# Patient Record
Sex: Female | Born: 1935 | Race: White | Hispanic: No | Smoking: Never smoker
Health system: Southern US, Community
[De-identification: ages and names within clinical notes are randomized; demographics above are authoritative.]

## PROBLEM LIST (undated history)

## (undated) DIAGNOSIS — N812 Incomplete uterovaginal prolapse: Secondary | ICD-10-CM

## (undated) DIAGNOSIS — E785 Hyperlipidemia, unspecified: Secondary | ICD-10-CM

## (undated) DIAGNOSIS — R002 Palpitations: Secondary | ICD-10-CM

## (undated) DIAGNOSIS — I10 Essential (primary) hypertension: Secondary | ICD-10-CM

## (undated) DIAGNOSIS — N952 Postmenopausal atrophic vaginitis: Secondary | ICD-10-CM

## (undated) DIAGNOSIS — K589 Irritable bowel syndrome without diarrhea: Secondary | ICD-10-CM

## (undated) HISTORY — DX: Hyperlipidemia, unspecified: E78.5

## (undated) HISTORY — DX: Irritable bowel syndrome, unspecified: K58.9

## (undated) HISTORY — DX: Palpitations: R00.2

## (undated) HISTORY — DX: Essential (primary) hypertension: I10

## (undated) HISTORY — DX: Postmenopausal atrophic vaginitis: N95.2

## (undated) HISTORY — DX: Incomplete uterovaginal prolapse: N81.2

---

## 2004-10-12 ENCOUNTER — Ambulatory Visit: Payer: Self-pay | Admitting: Internal Medicine

## 2004-10-25 ENCOUNTER — Ambulatory Visit: Payer: Self-pay | Admitting: Internal Medicine

## 2004-12-24 HISTORY — PX: CATARACT EXTRACTION: SUR2

## 2005-01-24 ENCOUNTER — Ambulatory Visit: Payer: Self-pay | Admitting: Ophthalmology

## 2005-01-30 ENCOUNTER — Ambulatory Visit: Payer: Self-pay | Admitting: Ophthalmology

## 2008-07-29 ENCOUNTER — Ambulatory Visit: Payer: Self-pay | Admitting: Internal Medicine

## 2010-04-11 LAB — HM MAMMOGRAPHY

## 2011-05-02 ENCOUNTER — Other Ambulatory Visit: Payer: Self-pay | Admitting: Internal Medicine

## 2012-10-23 LAB — HM MAMMOGRAPHY

## 2012-11-21 ENCOUNTER — Encounter: Payer: Self-pay | Admitting: *Deleted

## 2012-12-02 ENCOUNTER — Ambulatory Visit: Payer: Self-pay | Admitting: Internal Medicine

## 2012-12-08 ENCOUNTER — Encounter: Payer: Self-pay | Admitting: *Deleted

## 2012-12-08 DIAGNOSIS — E7800 Pure hypercholesterolemia, unspecified: Secondary | ICD-10-CM

## 2012-12-08 DIAGNOSIS — I10 Essential (primary) hypertension: Secondary | ICD-10-CM

## 2012-12-09 ENCOUNTER — Encounter: Payer: Self-pay | Admitting: Internal Medicine

## 2012-12-09 ENCOUNTER — Encounter: Payer: Self-pay | Admitting: *Deleted

## 2012-12-09 ENCOUNTER — Ambulatory Visit (INDEPENDENT_AMBULATORY_CARE_PROVIDER_SITE_OTHER): Payer: 59 | Admitting: Internal Medicine

## 2012-12-09 VITALS — BP 108/70 | HR 73 | Temp 98.4°F | Ht 63.0 in | Wt 133.0 lb

## 2012-12-09 DIAGNOSIS — R1013 Epigastric pain: Secondary | ICD-10-CM

## 2012-12-09 DIAGNOSIS — E7800 Pure hypercholesterolemia, unspecified: Secondary | ICD-10-CM

## 2012-12-09 DIAGNOSIS — I10 Essential (primary) hypertension: Secondary | ICD-10-CM | POA: Insufficient documentation

## 2012-12-09 DIAGNOSIS — R5383 Other fatigue: Secondary | ICD-10-CM

## 2012-12-09 DIAGNOSIS — R5381 Other malaise: Secondary | ICD-10-CM

## 2012-12-09 MED ORDER — PANTOPRAZOLE SODIUM 40 MG PO TBEC: 40.0000 mg | DELAYED_RELEASE_TABLET | Freq: Every day | ORAL | Status: DC

## 2012-12-11 ENCOUNTER — Ambulatory Visit: Payer: Self-pay | Admitting: Internal Medicine

## 2012-12-14 ENCOUNTER — Encounter: Payer: Self-pay | Admitting: Internal Medicine

## 2012-12-14 NOTE — Assessment & Plan Note (Signed)
Blood pressure under good control.  Same meds.  Follow.  Check metabolic panel.     

## 2012-12-14 NOTE — Progress Notes (Signed)
  Subjective:    Patient ID: Elizabeth [name], female    DOB: 1927-08-13, 78 y.o.   MRN: [id number]  HPI 78 year old female with past history of hypertension and hypercholesterolemia who comes in today for a scheduled follow up.  She states she has been doing relatively well.  Was taking Lipitor.  Was changed to Pravastatin (insurance request).  She noticed itching after she started the Pravastatin.  She is now also having stomach issues.  Occurs every evening.  Burns when she starts to eat.  Eating makes it better.  Bowels basically are the same.  Less diarrhea.  No chest pain or tightness.  Stays active.  Breathing stable.   Past Medical History  Diagnosis Date  . Irritable bowel syndrome   . Hyperlipidemia   . Hypertension     Current Outpatient Prescriptions on File Prior to Visit  Medication Sig Dispense Refill  . cetirizine (ZYRTEC) 10 MG tablet Take 10 mg by mouth daily.      . Cholecalciferol (VITAMIN D-3) 1000 UNITS CAPS Take by mouth.      . triamcinolone (NASACORT) 55 MCG/ACT nasal inhaler Place 2 sprays into the nose daily.      Marland Kitchen aspirin 81 MG tablet Take 81 mg by mouth daily.      Marland Kitchen atorvastatin (LIPITOR) 40 MG tablet Take 40 mg by mouth daily. Alternating between 20 mg  1/2 pill QOD      . pantoprazole (PROTONIX) 40 MG tablet Take 1 tablet (40 mg total) by mouth daily.  30 tablet  3  . quinapril (ACCUPRIL) 40 MG tablet Take 40 mg by mouth daily.        Review of Systems Patient denies any headache, lightheadedness or dizziness.  No significant sinus or allergy symptoms.  No chest pain, tightness or palpitations.  No increased shortness of breath, cough or congestion.  No nausea or vomiting.  Does report the increased burning in her stomach - when she starts to eat.  Eating does make it better.   No bowel change, such as increased diarrhea, constipation, BRBPR or melana.  Actually reports diarrhea may be less.  No urine change.        Objective:   Physical Exam Filed Vitals:   12/09/12 1332  BP: 108/70  Pulse: 73  Temp: 98.4 F (33.69 C)   78 year old female in no acute distress.   HEENT:  Nares - clear.  OP- without lesions or erythema.  NECK:  Supple, nontender.  No audible bruit.   HEART:  Appears to be regular. LUNGS:  Without crackles or wheezing audible.  Respirations even and unlabored.   RADIAL PULSE:  Equal bilaterally.  ABDOMEN:  Soft, nontender.  No audible abdominal bruit.   EXTREMITIES:  No increased edema to be present.                     Assessment & Plan:  EPIGASTRIC BURNING/ABDOMINAL DISCOMFORT.  Occurring every evening.  Start Protonix.  Check abdominal ultrasound.  Also check cbc, liver panel, amylase and lipase.  Follow closely.   CARDIOVASCULAR.  Stable.  Asymptomatic.  Continue risk factor modification.   HEALTH MAINTENANCE.  Physical 06/30/12.  Obtain mammogram results for this year.  Has declined further GI evaluation.

## 2012-12-14 NOTE — Assessment & Plan Note (Signed)
On no cholesterol medication now.  Low cholesterol diet and exercise.  Check lipid panel.  May consider Crestor.

## 2012-12-16 ENCOUNTER — Other Ambulatory Visit: Payer: Self-pay | Admitting: Internal Medicine

## 2012-12-16 MED ORDER — QUINAPRIL HCL 40 MG PO TABS: 40.0000 mg | ORAL_TABLET | Freq: Every day | ORAL | Status: DC

## 2012-12-16 NOTE — Telephone Encounter (Signed)
Sent in to pharmacy.  

## 2012-12-16 NOTE — Telephone Encounter (Signed)
Accupril 40 mg tab  Take one tablet by mouth every day  # 30

## 2012-12-18 ENCOUNTER — Other Ambulatory Visit (INDEPENDENT_AMBULATORY_CARE_PROVIDER_SITE_OTHER): Payer: 59

## 2012-12-18 DIAGNOSIS — E7800 Pure hypercholesterolemia, unspecified: Secondary | ICD-10-CM

## 2012-12-18 DIAGNOSIS — R5383 Other fatigue: Secondary | ICD-10-CM

## 2012-12-18 DIAGNOSIS — R1013 Epigastric pain: Secondary | ICD-10-CM

## 2012-12-18 DIAGNOSIS — I10 Essential (primary) hypertension: Secondary | ICD-10-CM

## 2012-12-18 LAB — CBC WITH DIFFERENTIAL/PLATELET
Basophils Absolute: 0.1 10*3/uL (ref 0.0–0.1)
Eosinophils Absolute: 0.3 10*3/uL (ref 0.0–0.7)
HCT: 40.3 % (ref 36.0–46.0)
Hemoglobin: 13.2 g/dL (ref 12.0–15.0)
Lymphs Abs: 1.9 10*3/uL (ref 0.7–4.0)
MCHC: 32.8 g/dL (ref 30.0–36.0)
Neutro Abs: 3.1 10*3/uL (ref 1.4–7.7)
RDW: 13.9 % (ref 11.5–14.6)

## 2012-12-18 LAB — BASIC METABOLIC PANEL
CO2: 23 mEq/L (ref 19–32)
GFR: 53.67 mL/min — ABNORMAL LOW (ref >60.00)
Glucose, Bld: 95 mg/dL (ref 70–99)
Potassium: 4.5 mEq/L (ref 3.5–5.1)
Sodium: 136 mEq/L (ref 135–145)

## 2012-12-18 LAB — LIPID PANEL: HDL: 53.4 mg/dL (ref >39.00)

## 2012-12-18 LAB — HEPATIC FUNCTION PANEL
AST: 25 U/L (ref 0–37)
Albumin: 3.8 g/dL (ref 3.5–5.2)
Alkaline Phosphatase: 52 U/L (ref 39–117)
Bilirubin, Direct: 0.1 mg/dL (ref 0.0–0.3)

## 2012-12-18 LAB — LDL CHOLESTEROL, DIRECT: Direct LDL: 226.6 mg/dL

## 2012-12-18 LAB — LIPASE: Lipase: 16 U/L (ref 11.0–59.0)

## 2012-12-18 LAB — AMYLASE: Amylase: 69 U/L (ref 27–131)

## 2012-12-19 ENCOUNTER — Other Ambulatory Visit: Payer: Self-pay | Admitting: Internal Medicine

## 2012-12-19 ENCOUNTER — Telehealth: Payer: Self-pay | Admitting: Internal Medicine

## 2012-12-19 DIAGNOSIS — E7800 Pure hypercholesterolemia, unspecified: Secondary | ICD-10-CM

## 2012-12-19 NOTE — Telephone Encounter (Signed)
Patient doing better, stomach does not hurt when she eats. She thinks the protonix is helping. She is having a little bit of pain in a different area (mostly cramping) in the last few days, however she thinks it is from her eating habits from holiday. She stated that she will back and let us know if she is having any problems.

## 2012-12-19 NOTE — Telephone Encounter (Signed)
Notify pt I reviewed her abdominal ultrasound.  The liver, pancreas, spleen - normal.  There is what appears to be a tiny stone versus polyp in the gallbladder.  No acute inflammation of the gallbladder.  No acute abnormality.  Need to know how she is doing since starting on Protonix.  Any abdominal discomfort, nausea, vomiting, etc.  Let me know if persistent pain or problems.

## 2012-12-19 NOTE — Progress Notes (Signed)
Orders placed for a follow up liver panel check in 6 weeks after starting cholesterol medication.

## 2012-12-23 ENCOUNTER — Encounter: Payer: Self-pay | Admitting: Internal Medicine

## 2012-12-24 MED ORDER — ROSUVASTATIN CALCIUM 10 MG PO TABS: 10.0000 mg | ORAL_TABLET | Freq: Every day | ORAL | Status: DC

## 2012-12-24 NOTE — Telephone Encounter (Signed)
Sent in rx for crestor #30 with 2 refills.

## 2013-01-01 ENCOUNTER — Telehealth: Payer: Self-pay | Admitting: Internal Medicine

## 2013-01-01 NOTE — Telephone Encounter (Signed)
atorvastatin (LIPITOR) 40 MG tablet  # 30

## 2013-01-02 ENCOUNTER — Other Ambulatory Visit: Payer: Self-pay | Admitting: Internal Medicine

## 2013-01-02 ENCOUNTER — Telehealth: Payer: Self-pay | Admitting: *Deleted

## 2013-01-02 MED ORDER — ATORVASTATIN CALCIUM 40 MG PO TABS: 40.0000 mg | ORAL_TABLET | Freq: Every day | ORAL | Status: DC

## 2013-01-02 NOTE — Telephone Encounter (Signed)
Dr. Lorin Picket,  The patient is requesting Lipitor 40 mg but I didn't see it on her med list. She saw you in 12/13 Please advise

## 2013-01-02 NOTE — Progress Notes (Signed)
Name brand lipitor 40mg  q day #30 with 5 refills - to [facility]

## 2013-01-02 NOTE — Telephone Encounter (Signed)
[facility] pharmacy called to see if you would switch patient's med. Patient wants to switch back to Lipitor, her insurance gave her a discount card to use on Lipitor were she can get it for $30. Patient told pharmacist that she has never taken the Crestor that you prescribed for her. Pharmacist said that you can call her if you don't understand.

## 2013-01-02 NOTE — Telephone Encounter (Signed)
Already addressed

## 2013-01-02 NOTE — Telephone Encounter (Signed)
Spoke to pharmacist.  Called in name brand lipitor 40mg  q day.  See messaging.

## 2013-01-09 ENCOUNTER — Encounter: Payer: Self-pay | Admitting: Adult Health

## 2013-01-09 ENCOUNTER — Ambulatory Visit (INDEPENDENT_AMBULATORY_CARE_PROVIDER_SITE_OTHER): Payer: 59 | Admitting: Adult Health

## 2013-01-09 ENCOUNTER — Telehealth: Payer: Self-pay | Admitting: Internal Medicine

## 2013-01-09 VITALS — BP 124/80 | HR 83 | Temp 98.1°F | Resp 16 | Ht 61.0 in | Wt 131.0 lb

## 2013-01-09 DIAGNOSIS — R399 Unspecified symptoms and signs involving the genitourinary system: Secondary | ICD-10-CM

## 2013-01-09 DIAGNOSIS — R1032 Left lower quadrant pain: Secondary | ICD-10-CM

## 2013-01-09 DIAGNOSIS — R3989 Other symptoms and signs involving the genitourinary system: Secondary | ICD-10-CM

## 2013-01-09 LAB — POCT URINALYSIS DIPSTICK
Blood, UA: NEGATIVE
Glucose, UA: NEGATIVE
Nitrite, UA: NEGATIVE
Protein, UA: NEGATIVE
Spec Grav, UA: 1.01
Urobilinogen, UA: 0.2

## 2013-01-09 MED ORDER — PREDNISONE 10 MG PO TABS: ORAL_TABLET | ORAL | Status: DC

## 2013-01-09 NOTE — Patient Instructions (Addendum)
  Please give [place] a urine sample before you leave clinic today.  Start a prednisone taper in the morning.  The probiotic we spoke about is called Librarian, academic. You can buy this over the counter at [facility].  Stop the protonix.  I have provided you with samples for Nexium. We will see if this agrees with you better.  If your symptoms do not improve after the prednisone taper we will discuss referral to a GI specialist.

## 2013-01-09 NOTE — Telephone Encounter (Signed)
Patient Information:  Caller Name: Mazzie  Phone: [phone]  Patient: Elizabeth [name]  Gender: Female  DOB: 04/01/1927  Age: 79 Years  PCP: [facility] [place]  Office Follow Up:  Does the office need to follow up with this patient?: No  Instructions For The Office: N/A   Symptoms  Reason For Call & Symptoms: Reports gas, abdominal pain with mucus/bloody stools. Reports seeing small amount of blood in the stool, but does see mucus in stools this morning. Reports harder cramping on the right side. Reports these symptoms did not start until she began taking Protonix; patient wants to stop taking Protonix.  Reviewed Health History In EMR: Yes  Reviewed Medications In EMR: Yes  Reviewed Allergies In EMR: Yes  Reviewed Surgeries / Procedures: Yes  Date of Onset of Symptoms: 01/07/2013  Guideline(s) Used:  Abdominal Pain - Female  Disposition Per Guideline:   Go to ED Now  Reason For Disposition Reached:   Bloody, black, or tarry bowel movements  Advice Given:  Call Back If:  Abdominal pain is constant and present for more than 2 hours  You become worse.  RN Overrode Recommendation:  Make Appointment  Office visit vs sending patient to ED.  Appointment Scheduled:  01/09/2013 15:45:00 Appointment Scheduled Provider:  Orville Govern

## 2013-01-09 NOTE — Progress Notes (Signed)
  Subjective:    Patient ID: Elizabeth [name], female    DOB: 08/04/1927, 79 y.o.   MRN: [id number]  HPI  Patient is a very active, pleasant 79 y/o with a hx of HTN, HLD and IBS who presents to clinic with c/o abdominal cramping, flatulence, loose, mucous stools she believes to be the results of taking protonix. She reports that she was having some issues of intolerance to some of the statins and was then started on protonix. Her symptoms above began after this. She denies fever, chills or any other problem. She reports she has not had any issues with her IBS in several years.   Current Outpatient Prescriptions on File Prior to Visit  Medication Sig Dispense Refill  . aspirin 81 MG tablet Take 81 mg by mouth daily.      Marland Kitchen atorvastatin (LIPITOR) 40 MG tablet Take 1 tablet (40 mg total) by mouth daily. Name brand only.  30 tablet  5  . cetirizine (ZYRTEC) 10 MG tablet Take 10 mg by mouth daily.      . Cholecalciferol (VITAMIN D-3) 1000 UNITS CAPS Take by mouth.      . quinapril (ACCUPRIL) 40 MG tablet Take 1 tablet (40 mg total) by mouth daily.  30 tablet  1  . triamcinolone (NASACORT) 55 MCG/ACT nasal inhaler Place 2 sprays into the nose daily.        Review of Systems  Constitutional: Negative for fever, chills, appetite change and fatigue.  Respiratory: Negative.   Cardiovascular: Negative.   Gastrointestinal: Positive for abdominal pain, blood in stool and abdominal distention. Negative for diarrhea and constipation.       Loose, mucous stools  Genitourinary: Negative.   Neurological: Negative.   Psychiatric/Behavioral: Negative.     BP 124/80  Pulse 83  Temp 98.1 F (36.7 C) (Oral)  Resp 16  Ht 5\' 1"  (1.549 m)  Wt 131 lb (59.421 kg)  BMI 24.75 kg/m2  SpO2 97%     Objective:   Physical Exam  Constitutional: She is oriented to person, place, and time. She appears well-developed and well-nourished. No distress.       Well dressed, cooperative  Cardiovascular: Normal rate,  regular rhythm and normal heart sounds.  Exam reveals no gallop.   No murmur heard. Pulmonary/Chest: Effort normal and breath sounds normal.  Abdominal: Soft. She exhibits distension. She exhibits no mass. There is tenderness. There is no rebound and no guarding.       Tenderness LLQ  Musculoskeletal: Normal range of motion.  Neurological: She is alert and oriented to person, place, and time. Coordination normal.  Skin: Skin is warm and dry.  Psychiatric: She has a normal mood and affect. Her behavior is normal. Judgment and thought content normal.        Assessment & Plan:

## 2013-01-10 LAB — URINALYSIS, ROUTINE W REFLEX MICROSCOPIC
Bilirubin Urine: NEGATIVE
Glucose, UA: NEGATIVE mg/dL
Hgb urine dipstick: NEGATIVE
Ketones, ur: NEGATIVE mg/dL
Specific Gravity, Urine: 1.015 (ref 1.005–1.030)
pH: 5.5 (ref 5.0–8.0)

## 2013-01-10 LAB — URINALYSIS, MICROSCOPIC ONLY
Casts: NONE SEEN
Crystals: NONE SEEN

## 2013-01-11 ENCOUNTER — Encounter: Payer: Self-pay | Admitting: Adult Health

## 2013-01-11 MED ORDER — ALIGN PO CAPS: 1.0000 | ORAL_CAPSULE | Freq: Every day | ORAL | Status: DC

## 2013-01-11 NOTE — Assessment & Plan Note (Addendum)
Tenderness upon palpation of LLQ. No masses palpable. Will stop protonix since common SE are abdominal cramping and flatulence. Also patient with hx of IBS, Will try a prednisone taper to see if this improves her symptoms. Start Align probiotic daily. Gave patient samples of Nexium to try should her symptoms of GERD worsen. She is aware it is also a PPI and may cause same symptoms. Send urinalysis and culture. RTC if symptoms do not improve within 5 days. Will then refer to GI.

## 2013-01-12 ENCOUNTER — Other Ambulatory Visit: Payer: Self-pay | Admitting: Adult Health

## 2013-01-12 ENCOUNTER — Encounter: Payer: Self-pay | Admitting: Internal Medicine

## 2013-01-12 LAB — URINE CULTURE

## 2013-01-12 MED ORDER — AMOXICILLIN 500 MG PO CAPS: 500.0000 mg | ORAL_CAPSULE | Freq: Two times a day (BID) | ORAL | Status: DC

## 2013-01-13 ENCOUNTER — Encounter: Payer: Self-pay | Admitting: Internal Medicine

## 2013-01-13 ENCOUNTER — Telehealth: Payer: Self-pay | Admitting: Internal Medicine

## 2013-01-13 NOTE — Telephone Encounter (Signed)
quinapril (ACCUPRIL) 40 MG tablet  #30  Needing prior authorization

## 2013-01-15 ENCOUNTER — Encounter: Payer: Self-pay | Admitting: Internal Medicine

## 2013-01-23 ENCOUNTER — Telehealth: Payer: Self-pay | Admitting: *Deleted

## 2013-01-23 NOTE — Telephone Encounter (Signed)
Called [phone] for prior authorization, they said her plan does not cover the Accupril, they will only cover the Quinapril only, the one she is taking now

## 2013-02-07 ENCOUNTER — Other Ambulatory Visit: Payer: Self-pay

## 2013-02-12 ENCOUNTER — Encounter: Payer: Self-pay | Admitting: Internal Medicine

## 2013-02-12 ENCOUNTER — Ambulatory Visit (INDEPENDENT_AMBULATORY_CARE_PROVIDER_SITE_OTHER): Payer: 59 | Admitting: Internal Medicine

## 2013-02-12 VITALS — BP 126/60 | HR 76 | Temp 97.7°F | Ht 63.0 in | Wt 133.8 lb

## 2013-02-12 DIAGNOSIS — R1032 Left lower quadrant pain: Secondary | ICD-10-CM

## 2013-02-16 ENCOUNTER — Telehealth: Payer: Self-pay | Admitting: *Deleted

## 2013-02-16 NOTE — Telephone Encounter (Signed)
Called [phone] for prior authorization on Accupril 40 mg; form is being faxed over now

## 2013-02-22 ENCOUNTER — Telehealth: Payer: Self-pay | Admitting: Internal Medicine

## 2013-02-22 ENCOUNTER — Encounter: Payer: Self-pay | Admitting: Internal Medicine

## 2013-02-22 NOTE — Assessment & Plan Note (Signed)
Resolved.  Eating now.  Bowels stable.  Follow.   

## 2013-02-22 NOTE — Progress Notes (Signed)
  Subjective:    Patient ID: Elizabeth [name], female    DOB: 1927/05/09, 79 y.o.   MRN: [id number]  HPI 79 year old female with past history of hypertension and hypercholesterolemia who comes in today for a scheduled follow up.  She saw Raquel recently for left lower quadrant pain and bloody mucus - stool.  This occurred after starting protonix.  She stopped the protonix and was given some prednisone and amoxicillin.  Symptoms resolved.  She has no abdominal pain now.  Will notice occasionally, when she goes to eat some minimally cramping.  Minimal queezy feeling.  No significant nausea or vomiting.  No bowel change now.  Stable.  No abdominal pain now.  No acid reflux.  Breathing stable.   Past Medical History  Diagnosis Date  . Irritable bowel syndrome   . Hyperlipidemia   . Hypertension     Review of Systems Patient denies any headache, lightheadedness or dizziness.  No significant sinus or allergy symptoms.  No chest pain, tightness or palpitations.  No increased shortness of breath, cough or congestion.  No nausea or vomiting.  Minimal queezy feeling as outlined.  Intermittent.  Still eating.  No abdominal pain or cramping now.   No bowel change now, such as diarrhea, constipation, BRBPR or melana.  No urine change.        Objective:   Physical Exam Filed Vitals:   02/12/13 1636  BP: 126/60  Pulse: 76  Temp: 97.7 F (36.70 C)   79 year old female in no acute distress.   HEENT:  Nares- clear.  Oropharynx - without lesions. NECK:  Supple.  Nontender.  No audible bruit.  HEART:  Appears to be regular. LUNGS:  No crackles or wheezing audible.  Respirations even and unlabored.  RADIAL PULSE:  Equal bilaterally.  ABDOMEN:  Soft, nontender.  Bowel sounds present and normal.  No audible abdominal bruit.  EXTREMITIES:  No increased edema present.  DP pulses palpable and equal bilaterally.          Assessment & Plan:  GI.  Previous abdominal pain and bloody mucus.  She related it to  starting the protonix.  Resolved now.  Discussed with her regarding further colon evaluation - i.e., colonoscopy.  She declines.  Wants to continue to monitor for now.  Follow.  Abdominal ultrasound revealed a tiny stone vs polyp.  She desires no further testing or evaluation at this point.   CARDIOVASCULAR.  Stable.  Asymptomatic.  Active.   HEALTH MAINTENANCE.  Last physical 06/30/12.  Mammogram 10/23/12 - [facility] I.

## 2013-02-22 NOTE — Assessment & Plan Note (Signed)
On lipitor now.  Low cholesterol diet and exercise.  Follow lipid panel and liver function.

## 2013-02-22 NOTE — Telephone Encounter (Signed)
Please schedule her for a physical in 7/14 (last 06/30/12).  Also schedule a fasting lab in 3 months.

## 2013-02-22 NOTE — Assessment & Plan Note (Signed)
Blood pressure under good control.  Same meds.  Follow.  Check metabolic panel.     

## 2013-02-23 NOTE — Telephone Encounter (Signed)
Left message for pt to call office

## 2013-02-24 NOTE — Telephone Encounter (Signed)
Elizabeth [name] made appointment

## 2013-05-27 ENCOUNTER — Other Ambulatory Visit (INDEPENDENT_AMBULATORY_CARE_PROVIDER_SITE_OTHER): Payer: 59

## 2013-05-27 DIAGNOSIS — I10 Essential (primary) hypertension: Secondary | ICD-10-CM

## 2013-05-27 DIAGNOSIS — E7800 Pure hypercholesterolemia, unspecified: Secondary | ICD-10-CM

## 2013-05-27 LAB — HEPATIC FUNCTION PANEL
ALT: 17 U/L (ref 0–35)
Albumin: 3.9 g/dL (ref 3.5–5.2)
Total Protein: 6.6 g/dL (ref 6.0–8.3)

## 2013-05-27 LAB — BASIC METABOLIC PANEL
Calcium: 9.5 mg/dL (ref 8.4–10.5)
GFR: 59.52 mL/min — ABNORMAL LOW (ref >60.00)
Potassium: 5.3 mEq/L — ABNORMAL HIGH (ref 3.5–5.1)

## 2013-05-27 LAB — LIPID PANEL
Cholesterol: 213 mg/dL — ABNORMAL HIGH (ref 0–200)
HDL: 53 mg/dL (ref >39.00)
VLDL: 23 mg/dL (ref 0.0–40.0)

## 2013-05-28 ENCOUNTER — Other Ambulatory Visit: Payer: Self-pay | Admitting: Internal Medicine

## 2013-05-28 ENCOUNTER — Encounter: Payer: Self-pay | Admitting: Internal Medicine

## 2013-05-28 DIAGNOSIS — E875 Hyperkalemia: Secondary | ICD-10-CM

## 2013-05-28 NOTE — Progress Notes (Signed)
Order placed for f/u potassium check.  

## 2013-06-03 ENCOUNTER — Telehealth: Payer: Self-pay | Admitting: *Deleted

## 2013-06-03 ENCOUNTER — Other Ambulatory Visit (INDEPENDENT_AMBULATORY_CARE_PROVIDER_SITE_OTHER): Payer: 59

## 2013-06-03 DIAGNOSIS — E7800 Pure hypercholesterolemia, unspecified: Secondary | ICD-10-CM

## 2013-06-03 DIAGNOSIS — E875 Hyperkalemia: Secondary | ICD-10-CM

## 2013-06-03 LAB — HEPATIC FUNCTION PANEL
AST: 22 U/L (ref 0–37)
Alkaline Phosphatase: 52 U/L (ref 39–117)
Bilirubin, Direct: 0.1 mg/dL (ref 0.0–0.3)

## 2013-06-03 NOTE — Telephone Encounter (Signed)
Pt came in for labs work and said that when you re-check her potassium you always make it a STAT, do you want it STAT?

## 2013-06-03 NOTE — Telephone Encounter (Signed)
yes

## 2013-06-04 ENCOUNTER — Encounter: Payer: Self-pay | Admitting: Internal Medicine

## 2013-07-06 ENCOUNTER — Ambulatory Visit (INDEPENDENT_AMBULATORY_CARE_PROVIDER_SITE_OTHER): Payer: 59 | Admitting: Internal Medicine

## 2013-07-06 ENCOUNTER — Encounter: Payer: Self-pay | Admitting: *Deleted

## 2013-07-06 ENCOUNTER — Telehealth: Payer: Self-pay | Admitting: Internal Medicine

## 2013-07-06 ENCOUNTER — Other Ambulatory Visit: Payer: Self-pay | Admitting: *Deleted

## 2013-07-06 ENCOUNTER — Encounter: Payer: Self-pay | Admitting: Internal Medicine

## 2013-07-06 VITALS — BP 110/70 | HR 72 | Temp 98.5°F | Ht 61.0 in | Wt 131.8 lb

## 2013-07-06 DIAGNOSIS — Z9109 Other allergy status, other than to drugs and biological substances: Secondary | ICD-10-CM

## 2013-07-06 DIAGNOSIS — R5381 Other malaise: Secondary | ICD-10-CM

## 2013-07-06 DIAGNOSIS — E7800 Pure hypercholesterolemia, unspecified: Secondary | ICD-10-CM

## 2013-07-06 DIAGNOSIS — R5383 Other fatigue: Secondary | ICD-10-CM

## 2013-07-06 DIAGNOSIS — I10 Essential (primary) hypertension: Secondary | ICD-10-CM

## 2013-07-06 MED ORDER — QUINAPRIL HCL 40 MG PO TABS: 40.0000 mg | ORAL_TABLET | Freq: Every day | ORAL | Status: DC

## 2013-07-06 NOTE — Telephone Encounter (Signed)
I think the medrol dose pack will help her sleep, but if she needs something else - she can take robitussin DM.

## 2013-07-06 NOTE — Telephone Encounter (Signed)
Unable to reach pt at contact number, sent myChart message to notify of advice.

## 2013-07-06 NOTE — Telephone Encounter (Signed)
Pt wanted to know what type of cough med she could take to help her sleep [facility]

## 2013-07-07 NOTE — Telephone Encounter (Signed)
Pt.notified

## 2013-07-09 ENCOUNTER — Encounter: Payer: Self-pay | Admitting: Internal Medicine

## 2013-07-09 DIAGNOSIS — Z9109 Other allergy status, other than to drugs and biological substances: Secondary | ICD-10-CM | POA: Insufficient documentation

## 2013-07-09 NOTE — Assessment & Plan Note (Signed)
Blood pressure under good control.  Same meds.  Follow.  Check metabolic panel.     

## 2013-07-09 NOTE — Assessment & Plan Note (Signed)
Continue zyrtec, saline and nasacort.  Follow.   

## 2013-07-09 NOTE — Progress Notes (Signed)
  Subjective:    Patient ID: Elizabeth [name], female    DOB: Jul 07, 1927, 79 y.o.   MRN: [id number]  HPI 79 year old female with past history of hypertension and hypercholesterolemia who comes in today to follow up on these issues as well as for a complete physical exam. She states she is doing relatively well.  Has been having some increased issues with allergies.  Using saline, zyrtec and nasacort.  No chest congestion.  No sob.  Staying active.  No cardiac symptoms reported with increased activity or exertion.  Breathing stable.  She does report occasionally noticing some issues with possible acid reflux.  Discussed with her regarding medication to help control.  She has had "intolerance" with previous PPIs.  Discussed zantac.  She declines.  Will avoid foods that aggravate and eating late.  No significant abdominal pain.  Bowels stable.     Past Medical History  Diagnosis Date  . Irritable bowel syndrome   . Hyperlipidemia   . Hypertension     Review of Systems Patient denies any headache, lightheadedness or dizziness.  Some allergy issues as outlined.  No chest pain, tightness or palpitations.  No increased shortness of breath, cough or congestion.  No nausea or vomiting.  No abdominal pain or cramping now.   No bowel change now, such as increased diarrhea, constipation, BRBPR or melana.  Bowels stable.  No urine change.        Objective:   Physical Exam  Filed Vitals:   07/06/13 1608  BP: 110/70  Pulse: 72  Temp: 98.5 F (32.68 C)   79 year old female in no acute distress.   HEENT:  Nares- clear.  Oropharynx - without lesions. NECK:  Supple.  Nontender.  No audible bruit.  HEART:  Appears to be regular. LUNGS:  No crackles or wheezing audible.  Respirations even and unlabored.  RADIAL PULSE:  Equal bilaterally.    BREASTS:  No nipple discharge or nipple retraction present.  Could not appreciate any distinct nodules or axillary adenopathy.  ABDOMEN:  Soft, nontender.  Bowel sounds  present and normal.  No audible abdominal bruit.  GU:  Not performed.   EXTREMITIES:  No increased edema present.  DP pulses palpable and equal bilaterally.          Assessment & Plan:  GI.  Previous abdominal pain and bloody mucus.  She related it to starting the protonix.  Resolved now.  Discussed with her regarding further colon evaluation - i.e., colonoscopy.  She declined.  Abdominal ultrasound revealed a tiny stone vs polyp.  She desires no further testing or evaluation at this point.  Wants to monitor and desires no further testing or evaluation.   CARDIOVASCULAR.  Stable.  Asymptomatic.  Active.   HEALTH MAINTENANCE.  Physical today.  Mammogram 10/23/12 - [facility] I.  GI as above.

## 2013-07-09 NOTE — Assessment & Plan Note (Signed)
On lipitor now.  Low cholesterol diet and exercise.  Follow lipid panel and liver function.

## 2013-07-29 ENCOUNTER — Other Ambulatory Visit: Payer: Self-pay

## 2013-11-04 ENCOUNTER — Other Ambulatory Visit (INDEPENDENT_AMBULATORY_CARE_PROVIDER_SITE_OTHER): Payer: 59

## 2013-11-04 DIAGNOSIS — I10 Essential (primary) hypertension: Secondary | ICD-10-CM

## 2013-11-04 DIAGNOSIS — R5383 Other fatigue: Secondary | ICD-10-CM

## 2013-11-04 DIAGNOSIS — R5381 Other malaise: Secondary | ICD-10-CM

## 2013-11-04 DIAGNOSIS — E7800 Pure hypercholesterolemia, unspecified: Secondary | ICD-10-CM

## 2013-11-04 LAB — BASIC METABOLIC PANEL
BUN: 24 mg/dL — ABNORMAL HIGH (ref 6–23)
Calcium: 8.9 mg/dL (ref 8.4–10.5)
Creatinine, Ser: 1 mg/dL (ref 0.4–1.2)
GFR: 56.04 mL/min — ABNORMAL LOW (ref >60.00)

## 2013-11-04 LAB — HEPATIC FUNCTION PANEL
ALT: 18 U/L (ref 0–35)
AST: 22 U/L (ref 0–37)
Albumin: 3.9 g/dL (ref 3.5–5.2)
Alkaline Phosphatase: 58 U/L (ref 39–117)
Bilirubin, Direct: 0.1 mg/dL (ref 0.0–0.3)
Total Bilirubin: 0.7 mg/dL (ref 0.3–1.2)
Total Protein: 6.5 g/dL (ref 6.0–8.3)

## 2013-11-04 LAB — CBC WITH DIFFERENTIAL/PLATELET
Basophils Absolute: 0 10*3/uL (ref 0.0–0.1)
Basophils Relative: 0.5 % (ref 0.0–3.0)
Eosinophils Absolute: 0.1 10*3/uL (ref 0.0–0.7)
Hemoglobin: 13.7 g/dL (ref 12.0–15.0)
Lymphs Abs: 1.7 10*3/uL (ref 0.7–4.0)
MCHC: 33.6 g/dL (ref 30.0–36.0)
MCV: 89.5 fl (ref 78.0–100.0)
Monocytes Absolute: 0.4 10*3/uL (ref 0.1–1.0)
Neutro Abs: 3.3 10*3/uL (ref 1.4–7.7)
RBC: 4.56 Mil/uL (ref 3.87–5.11)
RDW: 13.9 % (ref 11.5–14.6)

## 2013-11-04 LAB — LDL CHOLESTEROL, DIRECT: Direct LDL: 178.4 mg/dL

## 2013-11-04 LAB — LIPID PANEL
Cholesterol: 237 mg/dL — ABNORMAL HIGH (ref 0–200)
HDL: 51.6 mg/dL (ref >39.00)
Total CHOL/HDL Ratio: 5
VLDL: 19.4 mg/dL (ref 0.0–40.0)

## 2013-11-04 LAB — TSH: TSH: 2.87 u[IU]/mL (ref 0.35–5.50)

## 2013-11-05 ENCOUNTER — Encounter: Payer: Self-pay | Admitting: Internal Medicine

## 2013-11-09 ENCOUNTER — Encounter: Payer: Self-pay | Admitting: Internal Medicine

## 2013-11-09 ENCOUNTER — Ambulatory Visit (INDEPENDENT_AMBULATORY_CARE_PROVIDER_SITE_OTHER): Payer: 59 | Admitting: Internal Medicine

## 2013-11-09 VITALS — BP 130/70 | HR 70 | Temp 98.1°F | Ht 61.0 in | Wt 131.8 lb

## 2013-11-09 DIAGNOSIS — E7800 Pure hypercholesterolemia, unspecified: Secondary | ICD-10-CM

## 2013-11-09 DIAGNOSIS — R1032 Left lower quadrant pain: Secondary | ICD-10-CM

## 2013-11-09 DIAGNOSIS — Z9109 Other allergy status, other than to drugs and biological substances: Secondary | ICD-10-CM

## 2013-11-09 DIAGNOSIS — I10 Essential (primary) hypertension: Secondary | ICD-10-CM

## 2013-11-09 NOTE — Progress Notes (Signed)
Subjective:    Patient ID: Elizabeth [name], female    DOB: 10/03/1927, 79 y.o.   MRN: [id number]  HPI 79 year old female with past history of hypertension and hypercholesterolemia who comes in today for a scheduled follow up.  She states she is doing relatively well.  Some allergy issues.  Using saline, zyrtec and nasacort.  No chest congestion.  No sob.  Staying active.  No cardiac symptoms reported with increased activity or exertion.  Breathing stable. On zantac.  Controls with zantac (75mg ).  Still notices some occasional pain with eating.  Better.  Declines further w/up.   Will avoid foods that aggravate and eating late.  No significant abdominal pain.  Bowels stable.  Increased stress related to her husbands medical issues.  She feels she is handling things relatively well.     Past Medical History  Diagnosis Date  . Irritable bowel syndrome   . Hyperlipidemia   . Hypertension     Current Outpatient Prescriptions on File Prior to Visit  Medication Sig Dispense Refill  . aspirin 81 MG tablet Take 81 mg by mouth daily.      Marland Kitchen atorvastatin (LIPITOR) 40 MG tablet Take 1 tablet (40 mg total) by mouth daily. Name brand only.  30 tablet  5  . bifidobacterium infantis (ALIGN) capsule Take 1 capsule by mouth daily.  30 capsule  3  . cetirizine (ZYRTEC) 10 MG tablet Take 10 mg by mouth daily as needed.       . Cholecalciferol (VITAMIN D-3) 1000 UNITS CAPS Take by mouth.      . quinapril (ACCUPRIL) 40 MG tablet Take 1 tablet (40 mg total) by mouth daily.  30 tablet  11  . triamcinolone (NASACORT) 55 MCG/ACT nasal inhaler Place 2 sprays into the nose daily as needed.        No current facility-administered medications on file prior to visit.    Review of Systems Patient denies any headache, lightheadedness or dizziness.  Some allergy issues as outlined.  No chest pain, tightness or palpitations.  No increased shortness of breath, cough or congestion.  No nausea or vomiting.  Acid better on zantac.  No significant abdominal pain or cramping now.   No bowel change now, such as increased diarrhea, constipation, BRBPR or melana.  Bowels stable.  No urine change.   Increased stress as outlined.  Stays active.       Objective:   Physical Exam  Filed Vitals:   11/09/13 0943  BP: 130/70  Pulse: 70  Temp: 98.1 F (66.96 C)   79 year old female in no acute distress.   HEENT:  Nares- clear.  Oropharynx - without lesions. NECK:  Supple.  Nontender.  No audible bruit.  HEART:  Appears to be regular. LUNGS:  No crackles or wheezing audible.  Respirations even and unlabored.  RADIAL PULSE:  Equal bilaterally.   ABDOMEN:  Soft, nontender.  Bowel sounds present and normal.  No audible abdominal bruit.   EXTREMITIES:  No increased edema present.  DP pulses palpable and equal bilaterally.          Assessment & Plan:  GI.  Previous abdominal pain and bloody mucus.  She related it to starting the protonix.  Resolved now.  Discussed with her regarding further colon evaluation - i.e., colonoscopy.  She declined.  Abdominal ultrasound revealed a tiny stone vs polyp.  She desires no further testing or evaluation at this point.  Wants to monitor and desires no further  testing or evaluation.  Acid reflux better on zantac.    CARDIOVASCULAR.  Stable.  Asymptomatic.  Active.   INCREASED STRESS.  Discussed at length with her today.  She feels she is handling things relatively well.  Follow.   HEALTH MAINTENANCE.  Physical 07/06/13.  Mammogram 10/23/12 - [facility] I.  She is going to call and schedule her mammogram for 2014.  GI as above.   I spent 25 minutes with the patient and more than 50% of the time was spent in consultation regarding the above.

## 2013-11-09 NOTE — Progress Notes (Signed)
Pre-visit discussion using our clinic review tool. No additional management support is needed unless otherwise documented below in the visit note.  

## 2013-11-10 ENCOUNTER — Encounter: Payer: Self-pay | Admitting: Internal Medicine

## 2013-11-10 NOTE — Assessment & Plan Note (Signed)
Blood pressure under good control.  Same meds.  Follow.  Follow metabolic panel.   

## 2013-11-10 NOTE — Assessment & Plan Note (Signed)
On lipitor now.  Low cholesterol diet and exercise.  Follow lipid panel and liver function.  Cholesterol just checked and was elevated.  States she has not been taking her cholesterol medication regularly.  She will start taking on a regular basis.  Follow.  Hold on changing the dose.

## 2013-11-10 NOTE — Assessment & Plan Note (Signed)
Resolved.  Eating now.  Bowels stable.  Follow.   

## 2013-11-10 NOTE — Assessment & Plan Note (Signed)
Continue zyrtec, saline and nasacort.  Follow.   

## 2013-11-25 ENCOUNTER — Encounter: Payer: Self-pay | Admitting: Internal Medicine

## 2013-11-29 ENCOUNTER — Emergency Department: Payer: Self-pay | Admitting: Emergency Medicine

## 2013-11-29 LAB — URINALYSIS, COMPLETE
Bacteria: NONE SEEN
Bilirubin,UR: NEGATIVE
Glucose,UR: NEGATIVE mg/dL (ref 0–75)
Ketone: NEGATIVE
Nitrite: NEGATIVE
Specific Gravity: 1.015 (ref 1.003–1.030)
WBC UR: 3 /HPF (ref 0–5)

## 2013-11-29 LAB — CBC WITH DIFFERENTIAL/PLATELET
Basophil #: 0.1 10*3/uL (ref 0.0–0.1)
Basophil %: 0.5 %
Eosinophil %: 0.3 %
HGB: 14.4 g/dL (ref 12.0–16.0)
Lymphocyte #: 1.2 10*3/uL (ref 1.0–3.6)
MCH: 30.1 pg (ref 26.0–34.0)
MCV: 91 fL (ref 80–100)
Monocyte #: 0.3 x10 3/mm (ref 0.2–0.9)
Monocyte %: 2.4 %
Neutrophil %: 86.8 %
Platelet: 255 10*3/uL (ref 150–440)
RBC: 4.78 10*6/uL (ref 3.80–5.20)
RDW: 13.5 % (ref 11.5–14.5)
WBC: 12 10*3/uL — ABNORMAL HIGH (ref 3.6–11.0)

## 2013-11-29 LAB — COMPREHENSIVE METABOLIC PANEL
Alkaline Phosphatase: 75 U/L
BUN: 24 mg/dL — ABNORMAL HIGH (ref 7–18)
Calcium, Total: 9 mg/dL (ref 8.5–10.1)
Chloride: 100 mmol/L (ref 98–107)
Creatinine: 0.98 mg/dL (ref 0.60–1.30)
EGFR (African American): >60
EGFR (Non-African Amer.): 53 — ABNORMAL LOW
Glucose: 100 mg/dL — ABNORMAL HIGH (ref 65–99)
Osmolality: 269 (ref 275–301)
Potassium: 4.4 mmol/L (ref 3.5–5.1)
SGOT(AST): 23 U/L (ref 15–37)
SGPT (ALT): 29 U/L (ref 12–78)
Sodium: 132 mmol/L — ABNORMAL LOW (ref 136–145)
Total Protein: 7.4 g/dL (ref 6.4–8.2)

## 2013-12-25 ENCOUNTER — Other Ambulatory Visit: Payer: Self-pay | Admitting: *Deleted

## 2013-12-28 MED ORDER — ATORVASTATIN CALCIUM 40 MG PO TABS: 40.0000 mg | ORAL_TABLET | Freq: Every day | ORAL | Status: DC

## 2014-03-08 ENCOUNTER — Other Ambulatory Visit (INDEPENDENT_AMBULATORY_CARE_PROVIDER_SITE_OTHER): Payer: 59

## 2014-03-08 ENCOUNTER — Encounter: Payer: Self-pay | Admitting: Internal Medicine

## 2014-03-08 DIAGNOSIS — E7800 Pure hypercholesterolemia, unspecified: Secondary | ICD-10-CM

## 2014-03-08 DIAGNOSIS — I10 Essential (primary) hypertension: Secondary | ICD-10-CM

## 2014-03-08 LAB — LIPID PANEL
CHOLESTEROL: 175 mg/dL (ref 0–200)
HDL: 57.2 mg/dL (ref >39.00)
LDL Cholesterol: 98 mg/dL (ref 0–99)
TRIGLYCERIDES: 99 mg/dL (ref 0.0–149.0)
Total CHOL/HDL Ratio: 3
VLDL: 19.8 mg/dL (ref 0.0–40.0)

## 2014-03-08 LAB — HEPATIC FUNCTION PANEL
ALT: 23 U/L (ref 0–35)
AST: 21 U/L (ref 0–37)
Albumin: 4.2 g/dL (ref 3.5–5.2)
Alkaline Phosphatase: 61 U/L (ref 39–117)
Bilirubin, Direct: 0 mg/dL (ref 0.0–0.3)
TOTAL PROTEIN: 6.8 g/dL (ref 6.0–8.3)
Total Bilirubin: 0.6 mg/dL (ref 0.3–1.2)

## 2014-03-08 LAB — BASIC METABOLIC PANEL
BUN: 24 mg/dL — ABNORMAL HIGH (ref 6–23)
CO2: 25 meq/L (ref 19–32)
CREATININE: 1 mg/dL (ref 0.4–1.2)
Calcium: 9 mg/dL (ref 8.4–10.5)
Chloride: 108 mEq/L (ref 96–112)
GFR: 56.65 mL/min — ABNORMAL LOW (ref >60.00)
Glucose, Bld: 96 mg/dL (ref 70–99)
Potassium: 4.4 mEq/L (ref 3.5–5.1)
Sodium: 139 mEq/L (ref 135–145)

## 2014-03-11 ENCOUNTER — Ambulatory Visit (INDEPENDENT_AMBULATORY_CARE_PROVIDER_SITE_OTHER): Payer: Medicare Other | Admitting: Internal Medicine

## 2014-03-11 ENCOUNTER — Encounter: Payer: Self-pay | Admitting: Internal Medicine

## 2014-03-11 VITALS — BP 130/60 | HR 73 | Temp 98.2°F | Ht 61.0 in | Wt 130.5 lb

## 2014-03-11 DIAGNOSIS — I10 Essential (primary) hypertension: Secondary | ICD-10-CM

## 2014-03-11 DIAGNOSIS — Z9109 Other allergy status, other than to drugs and biological substances: Secondary | ICD-10-CM

## 2014-03-11 DIAGNOSIS — E7800 Pure hypercholesterolemia, unspecified: Secondary | ICD-10-CM

## 2014-03-11 DIAGNOSIS — Z23 Encounter for immunization: Secondary | ICD-10-CM

## 2014-03-11 DIAGNOSIS — R1032 Left lower quadrant pain: Secondary | ICD-10-CM

## 2014-03-11 NOTE — Progress Notes (Signed)
Subjective:    Patient ID: Elizabeth [name], female    DOB: April 18, 1927, 80 y.o.   MRN: [id number]  HPI 80 year old female with past history of hypertension and hypercholesterolemia who comes in today for a scheduled follow up.  She states she is doing relatively well.  Some allergy issues.  Using saline, zyrtec and nasacort.  No chest congestion.  No sob.  Staying active.  No cardiac symptoms reported with increased activity or exertion.  Breathing stable. On zantac.  Controls with zantac (75mg ).  No significant abdominal pain.  Bowels stable.  Increased stress related to her husbands medical issues.  She feels she is handling things relatively well.  Unable to take generic ace inhibitors.  Blood pressure does better with name brand accupril.     Past Medical History  Diagnosis Date  . Irritable bowel syndrome   . Hyperlipidemia   . Hypertension     Current Outpatient Prescriptions on File Prior to Visit  Medication Sig Dispense Refill  . aspirin 81 MG tablet Take 81 mg by mouth daily.      Marland Kitchen atorvastatin (LIPITOR) 40 MG tablet Take 1 tablet (40 mg total) by mouth daily. Name brand only.  30 tablet  5  . bifidobacterium infantis (ALIGN) capsule Take 1 capsule by mouth daily.  30 capsule  3  . cetirizine (ZYRTEC) 10 MG tablet Take 10 mg by mouth daily as needed.       . Cholecalciferol (VITAMIN D-3) 1000 UNITS CAPS Take by mouth.      . quinapril (ACCUPRIL) 40 MG tablet Take 1 tablet (40 mg total) by mouth daily.  30 tablet  11  . triamcinolone (NASACORT) 55 MCG/ACT nasal inhaler Place 2 sprays into the nose daily as needed.        No current facility-administered medications on file prior to visit.    Review of Systems Patient denies any headache, lightheadedness or dizziness.  Some allergy issues as outlined.  No chest pain, tightness or palpitations.  No increased shortness of breath, cough or congestion.  No nausea or vomiting.  Acid better on zantac. No significant abdominal pain or  cramping now.   No bowel change now, such as increased diarrhea, constipation, BRBPR or melana.  Bowels stable.  No urine change.   Increased stress as outlined.  Stays active.       Objective:   Physical Exam  Filed Vitals:   03/11/14 1100  BP: 130/60  Pulse: 73  Temp: 98.2 F (36.8 C)   Blood pressure recheck:  79-51/84  80 year old female in no acute distress.   HEENT:  Nares- clear.  Oropharynx - without lesions. NECK:  Supple.  Nontender.  No audible bruit.  HEART:  Appears to be regular. LUNGS:  No crackles or wheezing audible.  Respirations even and unlabored.  RADIAL PULSE:  Equal bilaterally.   ABDOMEN:  Soft, nontender.  Bowel sounds present and normal.  No audible abdominal bruit.   EXTREMITIES:  No increased edema present.  DP pulses palpable and equal bilaterally.          Assessment & Plan:  GI.  Previous abdominal pain and bloody mucus.  She related it to starting the protonix.  Resolved now.  Have discussed with her regarding further colon evaluation - i.e., colonoscopy.  She declined.  Abdominal ultrasound revealed a tiny stone vs polyp.  She desires no further testing or evaluation at this point.  Wants to monitor and desires no further testing  or evaluation.  Acid reflux better on zantac.  Feels bowels stable now.    CARDIOVASCULAR.  Stable.  Asymptomatic.  Active.   INCREASED STRESS.  Discussed at length with her today.  She feels she is handling things relatively well.  Follow.   HEALTH MAINTENANCE.  Physical 07/06/13.  Mammogram 10/23/12 - Birads I.  She is going to call and schedule her mammogram for 2014.  GI as above.   I spent 25 minutes with the patient and more than 50% of the time was spent in consultation regarding the above.

## 2014-03-11 NOTE — Progress Notes (Signed)
Pre-visit discussion using our clinic review tool. No additional management support is needed unless otherwise documented below in the visit note.  

## 2014-03-14 ENCOUNTER — Encounter: Payer: Self-pay | Admitting: Internal Medicine

## 2014-03-14 NOTE — Assessment & Plan Note (Signed)
Resolved.  Eating now.  Bowels stable.  Follow.

## 2014-03-14 NOTE — Assessment & Plan Note (Signed)
On lipitor.  Low cholesterol diet and exercise.   Follow lipid panel and liver function.   

## 2014-03-14 NOTE — Assessment & Plan Note (Signed)
Blood pressure under good control.  Same meds.  Follow.  Follow metabolic panel.  Unable to take generic ace inhibitor.  Blood pressure better on name brand accupril.

## 2014-03-14 NOTE — Assessment & Plan Note (Signed)
Continue zyrtec, saline and nasacort.  Follow.   

## 2014-03-19 ENCOUNTER — Telehealth: Payer: Self-pay | Admitting: *Deleted

## 2014-03-19 NOTE — Telephone Encounter (Signed)
Received PA request form for the accupril, form place in Dr.scott folder

## 2014-03-25 NOTE — Telephone Encounter (Signed)
PA for Accupril was approved from 02/20/14-03/23/15. Pt has been notified per [facility].

## 2014-05-31 ENCOUNTER — Other Ambulatory Visit: Payer: Self-pay | Admitting: *Deleted

## 2014-05-31 MED ORDER — QUINAPRIL HCL 40 MG PO TABS: 40.0000 mg | ORAL_TABLET | Freq: Every day | ORAL | Status: DC

## 2014-07-14 ENCOUNTER — Ambulatory Visit (INDEPENDENT_AMBULATORY_CARE_PROVIDER_SITE_OTHER): Payer: Medicare Other | Admitting: Internal Medicine

## 2014-07-14 ENCOUNTER — Encounter: Payer: Self-pay | Admitting: Internal Medicine

## 2014-07-14 VITALS — BP 108/58 | HR 80 | Temp 98.2°F | Ht 60.7 in | Wt 128.0 lb

## 2014-07-14 DIAGNOSIS — R1032 Left lower quadrant pain: Secondary | ICD-10-CM

## 2014-07-14 DIAGNOSIS — F439 Reaction to severe stress, unspecified: Secondary | ICD-10-CM

## 2014-07-14 DIAGNOSIS — E7800 Pure hypercholesterolemia, unspecified: Secondary | ICD-10-CM

## 2014-07-14 DIAGNOSIS — R5381 Other malaise: Secondary | ICD-10-CM

## 2014-07-14 DIAGNOSIS — R5383 Other fatigue: Secondary | ICD-10-CM

## 2014-07-14 DIAGNOSIS — Z0000 Encounter for general adult medical examination without abnormal findings: Secondary | ICD-10-CM

## 2014-07-14 DIAGNOSIS — H532 Diplopia: Secondary | ICD-10-CM

## 2014-07-14 DIAGNOSIS — Z733 Stress, not elsewhere classified: Secondary | ICD-10-CM

## 2014-07-14 DIAGNOSIS — H9190 Unspecified hearing loss, unspecified ear: Secondary | ICD-10-CM

## 2014-07-14 DIAGNOSIS — I10 Essential (primary) hypertension: Secondary | ICD-10-CM

## 2014-07-14 DIAGNOSIS — Z1239 Encounter for other screening for malignant neoplasm of breast: Secondary | ICD-10-CM

## 2014-07-14 NOTE — Progress Notes (Signed)
Pre visit review using our clinic review tool, if applicable. No additional management support is needed unless otherwise documented below in the visit note. 

## 2014-07-18 ENCOUNTER — Encounter: Payer: Self-pay | Admitting: Internal Medicine

## 2014-07-18 DIAGNOSIS — H532 Diplopia: Secondary | ICD-10-CM | POA: Insufficient documentation

## 2014-07-18 DIAGNOSIS — F439 Reaction to severe stress, unspecified: Secondary | ICD-10-CM | POA: Insufficient documentation

## 2014-07-18 DIAGNOSIS — R5383 Other fatigue: Secondary | ICD-10-CM | POA: Insufficient documentation

## 2014-07-18 DIAGNOSIS — H9190 Unspecified hearing loss, unspecified ear: Secondary | ICD-10-CM | POA: Insufficient documentation

## 2014-07-18 MED ORDER — QUINAPRIL HCL 20 MG PO TABS: ORAL_TABLET | ORAL | Status: DC

## 2014-07-18 NOTE — Assessment & Plan Note (Signed)
Had the intermittent episode of double vision as outlined.  This was not similar to her occular migraines.  Has not been taking her aspirin regularly.  Instructed to take daily.  Discussed possibility of TIA.  Wanted to pursue further w/up including carotid ultrasound, MRI (brain), etc.  She declines at this time.  We also discussed echo.  She declines.  Did agree to have her eyes checked.  Will refer to opthalmology.  Will let me know if she changes her mind.  Will be evaluated immediately if symptoms reoccur.

## 2014-07-18 NOTE — Assessment & Plan Note (Signed)
Increased stress as outlined.  Desires no further intervention at this time.  Follow.    

## 2014-07-18 NOTE — Assessment & Plan Note (Signed)
Has noticed a change in her hearing.  Will refer to ENT for evaluation.

## 2014-07-18 NOTE — Progress Notes (Signed)
Subjective:    Patient ID: Elizabeth Haymakerolleen C [name], female    DOB: 11/03/1927, 80 y.o.   MRN: [id number]  HPI 80 year old female with past history of hypertension and hypercholesterolemia who comes in today to follow up on these issues as well as for a complete physical exam.  She states she is doing relatively well.  Increased stress recently.  Sold her house.  Increased stress with moving and with her husbands medical issues.  Increased fatigue.   No chest congestion.  No sob.  Staying active.  No cardiac symptoms reported with increased activity or exertion.  Goes to exercise 3x/week.  Breathing stable. On zantac.  Acid controlled with zantac (75mg ).  No significant abdominal pain.  Bowels stable.  Saw Marian SorrowJon Wolff for her thumb.  Using thumb spica.  Overdue mammogram.  Does not want scheduled until 10/15.  She states she had a brief episode of double vision.  Only lasted seconds.  No headache associated.  Has a history of occular migraines, but states this was different.  No other episodes.  Has not been taking her aspirin regularly.  No other neurological changes.      Past Medical History  Diagnosis Date  . Irritable bowel syndrome   . Hyperlipidemia   . Hypertension     Current Outpatient Prescriptions on File Prior to Visit  Medication Sig Dispense Refill  . aspirin 81 MG tablet Take 81 mg by mouth daily.      Marland Kitchen. atorvastatin (LIPITOR) 40 MG tablet Take 1 tablet (40 mg total) by mouth daily. Name brand only.  30 tablet  5  . cetirizine (ZYRTEC) 10 MG tablet Take 10 mg by mouth daily as needed.       . Cholecalciferol (VITAMIN D-3) 1000 UNITS CAPS Take by mouth.      . quinapril (ACCUPRIL) 40 MG tablet Take 1 tablet (40 mg total) by mouth daily.  30 tablet  3  . triamcinolone (NASACORT) 55 MCG/ACT nasal inhaler Place 2 sprays into the nose daily as needed.        No current facility-administered medications on file prior to visit.    Review of Systems Patient denies any headache, lightheadedness  or dizziness.  Vision change as outlined.  No significant allergy issues reported today.  No chest pain, tightness or palpitations.  No increased shortness of breath, cough or congestion.  No nausea or vomiting.  Acid better on zantac. No significant abdominal pain or cramping now.   No bowel change now, such as increased diarrhea, constipation, BRBPR or melana.  Bowels stable.  No urine change.   Increased stress as outlined.  Stays active.  Has noticed decreased hearing.       Objective:   Physical Exam  Filed Vitals:   07/14/14 1026  BP: 108/58  Pulse: 80  Temp: 98.2 F (36.8 C)   Blood pressure recheck:  36128/5464  80 year old female in no acute distress.   HEENT:  Nares- clear.  Oropharynx - without lesions. NECK:  Supple.  Nontender.  No audible bruit.  HEART:  Appears to be regular. LUNGS:  No crackles or wheezing audible.  Respirations even and unlabored.  RADIAL PULSE:  Equal bilaterally.    BREASTS:  No nipple discharge or nipple retraction present.  Could not appreciate any distinct nodules or axillary adenopathy.  ABDOMEN:  Soft, nontender.  Bowel sounds present and normal.  No audible abdominal bruit.  GU:  Not performed.     EXTREMITIES:  No increased edema present.  DP pulses palpable and equal bilaterally.          Assessment & Plan:  GI.  Previous abdominal pain and bloody mucus.  She related it to starting the protonix.  Resolved now.  Have discussed with her regarding further colon evaluation - i.e., colonoscopy.  She declined.  Abdominal ultrasound revealed a tiny stone vs polyp.  She desires no further testing or evaluation at this point.  Wants to monitor and desires no further testing or evaluation.  Acid reflux better on zantac.  Feels bowels stable now.    CARDIOVASCULAR.  Stable.  Asymptomatic.  Active.   HEALTH MAINTENANCE.  Physical today.  Mammogram 10/23/12 - Birads I.  Is overdue mammogram.  She is agreeable for me to schedule, but does not want it  scheduled until October.  GI as above.   I spent 40 minutes with the patient and more than 50% of the time was spent in consultation regarding the above.

## 2014-07-18 NOTE — Assessment & Plan Note (Signed)
Probably multifactorial with the move and increased stress.  Check cbc, met c and tsh.  Adjust blood pressure medication as outlined.  Follow.

## 2014-07-18 NOTE — Assessment & Plan Note (Signed)
Resolved.  Eating now.  Bowels stable.  Follow.

## 2014-07-18 NOTE — Assessment & Plan Note (Signed)
On lipitor.  Low cholesterol diet and exercise.   Follow lipid panel and liver function.   

## 2014-07-18 NOTE — Assessment & Plan Note (Signed)
Blood pressure has been under good control.  A little lower today.  She reports it has been lower.  I am going to change accupril to 20mg  q day.  Follow pressures.   Follow metabolic panel.  Have her spot check her blood pressure.  F/u here in the office.

## 2014-07-30 ENCOUNTER — Encounter: Payer: Self-pay | Admitting: Internal Medicine

## 2014-07-30 ENCOUNTER — Other Ambulatory Visit (INDEPENDENT_AMBULATORY_CARE_PROVIDER_SITE_OTHER): Payer: Medicare Other

## 2014-07-30 DIAGNOSIS — I10 Essential (primary) hypertension: Secondary | ICD-10-CM

## 2014-07-30 DIAGNOSIS — E7800 Pure hypercholesterolemia, unspecified: Secondary | ICD-10-CM

## 2014-07-30 LAB — LIPID PANEL
CHOLESTEROL: 176 mg/dL (ref 0–200)
HDL: 50.2 mg/dL (ref >39.00)
LDL CALC: 105 mg/dL — AB (ref 0–99)
NonHDL: 125.8
Total CHOL/HDL Ratio: 4
Triglycerides: 106 mg/dL (ref 0.0–149.0)
VLDL: 21.2 mg/dL (ref 0.0–40.0)

## 2014-07-30 LAB — HEPATIC FUNCTION PANEL
ALBUMIN: 3.9 g/dL (ref 3.5–5.2)
ALT: 19 U/L (ref 0–35)
AST: 21 U/L (ref 0–37)
Alkaline Phosphatase: 72 U/L (ref 39–117)
BILIRUBIN TOTAL: 0.5 mg/dL (ref 0.2–1.2)
Bilirubin, Direct: 0 mg/dL (ref 0.0–0.3)
Total Protein: 6.6 g/dL (ref 6.0–8.3)

## 2014-07-30 LAB — BASIC METABOLIC PANEL
BUN: 21 mg/dL (ref 6–23)
CALCIUM: 9 mg/dL (ref 8.4–10.5)
CO2: 26 mEq/L (ref 19–32)
Chloride: 105 mEq/L (ref 96–112)
Creatinine, Ser: 1.1 mg/dL (ref 0.4–1.2)
GFR: 52.3 mL/min — AB (ref >60.00)
Glucose, Bld: 93 mg/dL (ref 70–99)
Potassium: 5 mEq/L (ref 3.5–5.1)
Sodium: 137 mEq/L (ref 135–145)

## 2014-08-02 NOTE — Telephone Encounter (Signed)
Unread mychart message mailed to patient 

## 2014-09-18 ENCOUNTER — Ambulatory Visit: Payer: Medicare Other

## 2014-09-24 ENCOUNTER — Other Ambulatory Visit: Payer: Self-pay | Admitting: *Deleted

## 2014-09-24 MED ORDER — QUINAPRIL HCL 20 MG PO TABS: ORAL_TABLET | ORAL | Status: DC

## 2014-10-14 ENCOUNTER — Ambulatory Visit (INDEPENDENT_AMBULATORY_CARE_PROVIDER_SITE_OTHER): Payer: Medicare Other | Admitting: Internal Medicine

## 2014-10-14 ENCOUNTER — Encounter: Payer: Self-pay | Admitting: Internal Medicine

## 2014-10-14 VITALS — BP 120/60 | HR 80 | Temp 98.6°F | Ht 60.7 in | Wt 132.8 lb

## 2014-10-14 DIAGNOSIS — E7800 Pure hypercholesterolemia, unspecified: Secondary | ICD-10-CM

## 2014-10-14 DIAGNOSIS — Z91048 Other nonmedicinal substance allergy status: Secondary | ICD-10-CM

## 2014-10-14 DIAGNOSIS — H9190 Unspecified hearing loss, unspecified ear: Secondary | ICD-10-CM

## 2014-10-14 DIAGNOSIS — Z9109 Other allergy status, other than to drugs and biological substances: Secondary | ICD-10-CM

## 2014-10-14 DIAGNOSIS — I10 Essential (primary) hypertension: Secondary | ICD-10-CM

## 2014-10-14 DIAGNOSIS — F439 Reaction to severe stress, unspecified: Secondary | ICD-10-CM

## 2014-10-14 DIAGNOSIS — Z658 Other specified problems related to psychosocial circumstances: Secondary | ICD-10-CM

## 2014-10-14 DIAGNOSIS — R5383 Other fatigue: Secondary | ICD-10-CM

## 2014-10-14 DIAGNOSIS — H532 Diplopia: Secondary | ICD-10-CM

## 2014-10-14 NOTE — Progress Notes (Signed)
Pre visit review using our clinic review tool, if applicable. No additional management support is needed unless otherwise documented below in the visit note. 

## 2014-10-18 ENCOUNTER — Encounter: Payer: Self-pay | Admitting: Internal Medicine

## 2014-10-18 NOTE — Assessment & Plan Note (Signed)
Blood pressure has been under good control.  Last visit we decreased her accupril to 20mg  q day.  She put herself back on 40mg  q day.  States blood pressure averaging low 100s.  Decrease back to 20mg  q day.  Follow pressures.   Follow metabolic panel.  Have her spot check her blood pressure.

## 2014-10-18 NOTE — Assessment & Plan Note (Signed)
Had the intermittent episode of double vision as outlined in last note.   Discussed possibility of TIA.  Wanted to pursue further w/up including carotid ultrasound, MRI (brain), etc.  She declined and continues to decline.  Has not had reoccurrence.  Saw Dr Oren BracketSydnor.  Eyes checked out fine.  Take aspirin daily.

## 2014-10-18 NOTE — Assessment & Plan Note (Signed)
On lipitor.  Low cholesterol diet and exercise.  Follow lipid panel and liver function.   Lab Results  Component Value Date   CHOL 176 07/30/2014   HDL 50.20 07/30/2014   LDLCALC 105* 07/30/2014   LDLDIRECT 178.4 11/04/2013   TRIG 106.0 07/30/2014   CHOLHDL 4 07/30/2014

## 2014-10-18 NOTE — Progress Notes (Signed)
Subjective:    Patient ID: Elizabeth [name], female    DOB: 09/20/1927, 80 y.o.   MRN: [id number]  HPI 80 year old female with past history of hypertension and hypercholesterolemia who comes in today for a scheduled follow up.   She states she is doing relatively well.  Increased stress recently.  Sold her house.  Increased stress with moving and with her husbands medical issues. They are moved in now. She feels she is coping well.  Does not need any further intervention.   No chest congestion.  No sob.  Staying active.  No cardiac symptoms reported with increased activity or exertion. Is exercising.  Breathing stable. On zantac.  Acid controlled with zantac (75mg ).  No significant abdominal pain.  Bowels stable.  Overdue mammogram.  Discussed with her today.  She declines to be scheduled at this time.  No other episodes of double vision or vision change.  See last note.  She does have hearing aids.  Getting adjusted.  We decreased her accupril to 20mg  last visit.  She put herself back on 40mg .  Afraid her blood pressure would go to high.  States it is averaging in the low 100s.        Past Medical History  Diagnosis Date  . Irritable bowel syndrome   . Hyperlipidemia   . Hypertension     Current Outpatient Prescriptions on File Prior to Visit  Medication Sig Dispense Refill  . aspirin 81 MG tablet Take 81 mg by mouth daily.      Marland Kitchen. atorvastatin (LIPITOR) 40 MG tablet Take 1 tablet (40 mg total) by mouth daily. Name brand only.  30 tablet  5  . cetirizine (ZYRTEC) 10 MG tablet Take 10 mg by mouth daily as needed.       . Cholecalciferol (VITAMIN D-3) 1000 UNITS CAPS Take by mouth.      . quinapril (ACCUPRIL) 20 MG tablet One po q day.  Name brand only.  30 tablet  5  . triamcinolone (NASACORT) 55 MCG/ACT nasal inhaler Place 2 sprays into the nose daily as needed.        No current facility-administered medications on file prior to visit.    Review of Systems Patient denies any headache,  lightheadedness or dizziness.  no further vision change.  No significant allergy issues reported today.  No chest pain, tightness or palpitations.  No increased shortness of breath, cough or congestion.  No nausea or vomiting.  Acid better on zantac. No significant abdominal pain or cramping now.   No bowel change now, such as increased diarrhea, constipation, BRBPR or melana.  Bowels stable.  No urine change.   Increased stress as outlined.  Stays active.  Has hearing aids now.       Objective:   Physical Exam  Filed Vitals:   10/14/14 1057  BP: 120/60  Pulse: 80  Temp: 98.6 F (37 C)   Blood pressure recheck:  53132/7176  80 year old female in no acute distress.   HEENT:  Nares- clear.  Oropharynx - without lesions. NECK:  Supple.  Nontender.  No audible bruit.  HEART:  Appears to be regular. LUNGS:  No crackles or wheezing audible.  Respirations even and unlabored.  RADIAL PULSE:  Equal bilaterally.   ABDOMEN:  Soft, nontender.  Bowel sounds present and normal.  No audible abdominal bruit.     EXTREMITIES:  No increased edema present.  DP pulses palpable and equal bilaterally.  Assessment & Plan:  GI.  Previous abdominal pain and bloody mucus.  She related it to starting the protonix.  Resolved now.  Have discussed with her regarding further colon evaluation - i.e., colonoscopy.  She declined.  Abdominal ultrasound revealed a tiny stone vs polyp.  She desires no further testing or evaluation at this point.  Wants to monitor and desires no further testing or evaluation.  Acid reflux better on zantac.  Feels bowels stable now.    CARDIOVASCULAR.  Stable.  Asymptomatic.  Active.   HEALTH MAINTENANCE.  Physical last visit.  Mammogram 10/23/12 - Birads I.  Is overdue mammogram.  She does not want her mammogram scheduled at this time.   GI as above.   Problem List Items Addressed This Visit   Decreased hearing     Wearing hearing aids now.  Follow.      Double vision     Had  the intermittent episode of double vision as outlined in last note.   Discussed possibility of TIA.  Wanted to pursue further w/up including carotid ultrasound, MRI (brain), etc.  She declined and continues to decline.  Has not had reoccurrence.  Saw Dr Oren BracketSydnor.  Eyes checked out fine.  Take aspirin daily.          Environmental allergies     Continue zyrtec, saline and nasacort.  Follow.      Hypercholesterolemia      On lipitor.  Low cholesterol diet and exercise.  Follow lipid panel and liver function.   Lab Results  Component Value Date   CHOL 176 07/30/2014   HDL 50.20 07/30/2014   LDLCALC 105* 07/30/2014   LDLDIRECT 178.4 11/04/2013   TRIG 106.0 07/30/2014   CHOLHDL 4 07/30/2014         Hypertension - Primary     Blood pressure has been under good control.  Last visit we decreased her accupril to 20mg  q day.  She put herself back on 40mg  q day.  States blood pressure averaging low 100s.  Decrease back to 20mg  q day.  Follow pressures.   Follow metabolic panel.  Have her spot check her blood pressure.        Stress     Increased stress as outlined.  Desires no further intervention at this time.  Follow.         I spent 25 minutes with the patient and more than 50% of the time was spent in consultation regarding the above.

## 2014-10-18 NOTE — Assessment & Plan Note (Signed)
Wearing hearing aids now.  Follow.

## 2014-10-18 NOTE — Assessment & Plan Note (Signed)
Increased stress as outlined.  Desires no further intervention at this time.  Follow.

## 2014-10-18 NOTE — Assessment & Plan Note (Signed)
Continue zyrtec, saline and nasacort.  Follow.

## 2014-10-18 NOTE — Assessment & Plan Note (Signed)
Probably multifactorial with the move and increased stress.  Check cbc, met c and tsh.  Adjust blood pressure medication as outlined.  Follow.

## 2014-11-04 ENCOUNTER — Telehealth: Payer: Self-pay | Admitting: Internal Medicine

## 2014-11-04 MED ORDER — QUINAPRIL HCL 40 MG PO TABS: ORAL_TABLET | ORAL | Status: DC

## 2014-11-04 NOTE — Telephone Encounter (Signed)
The patient is wanting her 40 mg tablets of ACCUPRIL instead of  20 MG tablet.

## 2014-11-04 NOTE — Telephone Encounter (Signed)
Last OV 10.22.15.  Last refill 10.2.15.  Please advise medication dose increase.

## 2014-11-04 NOTE — Telephone Encounter (Signed)
I sent in refill for accupril #30 with 5 refills.  (I did not know if needed 90 days.  If needs 90 days, will need to call pharmacy and change).

## 2014-11-04 NOTE — Telephone Encounter (Signed)
I spoke with pt she states she did not take the 20 mg long as her BP became elevated.  She further states she is completely out of medication.  Please advise

## 2014-11-04 NOTE — Telephone Encounter (Signed)
Pt.notified

## 2014-11-04 NOTE — Telephone Encounter (Signed)
We decreased her accupril because her bp was remaining low.  Is her blood pressure running high now?  Why does she want the 20mg 

## 2014-12-31 ENCOUNTER — Other Ambulatory Visit: Payer: Self-pay | Admitting: *Deleted

## 2014-12-31 MED ORDER — ATORVASTATIN CALCIUM 40 MG PO TABS: 40.0000 mg | ORAL_TABLET | Freq: Every day | ORAL | Status: DC

## 2015-02-09 ENCOUNTER — Ambulatory Visit (INDEPENDENT_AMBULATORY_CARE_PROVIDER_SITE_OTHER): Payer: Medicare Other | Admitting: Internal Medicine

## 2015-02-09 ENCOUNTER — Encounter: Payer: Self-pay | Admitting: Internal Medicine

## 2015-02-09 VITALS — BP 114/70 | HR 75 | Temp 98.3°F | Ht 60.7 in | Wt 134.5 lb

## 2015-02-09 DIAGNOSIS — Z658 Other specified problems related to psychosocial circumstances: Secondary | ICD-10-CM

## 2015-02-09 DIAGNOSIS — H532 Diplopia: Secondary | ICD-10-CM

## 2015-02-09 DIAGNOSIS — Z9109 Other allergy status, other than to drugs and biological substances: Secondary | ICD-10-CM

## 2015-02-09 DIAGNOSIS — F439 Reaction to severe stress, unspecified: Secondary | ICD-10-CM

## 2015-02-09 DIAGNOSIS — Z91048 Other nonmedicinal substance allergy status: Secondary | ICD-10-CM

## 2015-02-09 DIAGNOSIS — I10 Essential (primary) hypertension: Secondary | ICD-10-CM

## 2015-02-09 DIAGNOSIS — N8110 Cystocele, unspecified: Secondary | ICD-10-CM

## 2015-02-09 DIAGNOSIS — E7800 Pure hypercholesterolemia, unspecified: Secondary | ICD-10-CM

## 2015-02-09 NOTE — Progress Notes (Signed)
Pre visit review using our clinic review tool, if applicable. No additional management support is needed unless otherwise documented below in the visit note. 

## 2015-02-10 ENCOUNTER — Encounter: Payer: Self-pay | Admitting: Internal Medicine

## 2015-02-10 NOTE — Assessment & Plan Note (Signed)
Feels she is handling stress well.  Follow.  Desires no further intervention.

## 2015-02-10 NOTE — Progress Notes (Signed)
Patient ID: Elizabeth [name], female   DOB: 08/03/1927, 81 y.o.   MRN: [id number]   Subjective:    Patient ID: Elizabeth [name], female    DOB: [birth year], 81 y.o.   MRN: [id number]  HPI  Patient here for a scheduled follow up.  She has been having increased problems with bladder prolapse.  No significant urinary incontinence.  Tries to stay active.  No cardiac symptoms with increased activity or exertion.  Increased stress with her husband's health issues.  Feels she is coping relatively well.  Eating and drinking well.  Bowels stable.  Still flare intermittently.     Past Medical History  Diagnosis Date  . Irritable bowel syndrome   . Hyperlipidemia   . Hypertension     Current Outpatient Prescriptions on File Prior to Visit  Medication Sig Dispense Refill  . aspirin 81 MG tablet Take 81 mg by mouth daily.    Marland Kitchen atorvastatin (LIPITOR) 40 MG tablet Take 1 tablet (40 mg total) by mouth daily. Name brand only. 30 tablet 5  . cetirizine (ZYRTEC) 10 MG tablet Take 10 mg by mouth daily as needed.     . Cholecalciferol (VITAMIN D-3) 1000 UNITS CAPS Take by mouth.    . quinapril (ACCUPRIL) 40 MG tablet Needs name brand only 30 tablet 5  . triamcinolone (NASACORT) 55 MCG/ACT nasal inhaler Place 2 sprays into the nose daily as needed.      No current facility-administered medications on file prior to visit.    Review of Systems  Constitutional: Negative for appetite change and unexpected weight change.  HENT: Negative for congestion and sinus pressure.        Takes zyrtec to control her allergies.    Respiratory: Negative for cough, chest tightness and shortness of breath.   Cardiovascular: Negative for chest pain, palpitations and leg swelling.  Gastrointestinal: Negative for nausea and abdominal pain.       Bowels stable.    Genitourinary: Negative for dysuria.       No urinary incontinence.  Bladder prolapse.    Musculoskeletal: Negative for back pain and joint swelling.  Neurological:  Negative for dizziness, light-headedness and headaches.       No vision change.   Psychiatric/Behavioral:       Feeling she is handling stress well.         Objective:    Physical Exam  Constitutional: She appears well-developed and well-nourished. No distress.  HENT:  Nose: Nose normal.  Mouth/Throat: Oropharynx is clear and moist.  Neck: Neck supple. No thyromegaly present.  Cardiovascular: Normal rate and regular rhythm.   Pulmonary/Chest: Breath sounds normal. No respiratory distress. She has no wheezes.  Abdominal: Soft. Bowel sounds are normal. There is no tenderness.  Musculoskeletal: She exhibits no edema or tenderness.  Lymphadenopathy:    She has no cervical adenopathy.  Skin: No rash noted. No erythema.    BP 114/70 mmHg  Pulse 75  Temp(Src) 98.3 F (36.8 C) (Oral)  Ht 5' 0.7" (1.542 m)  Wt 134 lb 8 oz (61.009 kg)  BMI 25.66 kg/m2  SpO2 97% Wt Readings from Last 3 Encounters:  02/09/15 134 lb 8 oz (61.009 kg)  10/14/14 132 lb 12 oz (60.215 kg)  07/14/14 128 lb (58.06 kg)     Lab Results  Component Value Date   WBC 5.5 11/04/2013   HGB 13.7 11/04/2013   HCT 40.8 11/04/2013   PLT 253.0 11/04/2013   GLUCOSE 93 07/30/2014   CHOL 176  07/30/2014   TRIG 106.0 07/30/2014   HDL 50.20 07/30/2014   LDLDIRECT 178.4 11/04/2013   LDLCALC 105* 07/30/2014   ALT 19 07/30/2014   AST 21 07/30/2014   NA 137 07/30/2014   K 5.0 07/30/2014   CL 105 07/30/2014   CREATININE 1.1 07/30/2014   BUN 21 07/30/2014   CO2 26 07/30/2014   TSH 2.87 11/04/2013       Assessment & Plan:   Problem List Items Addressed This Visit    Double vision    No reoccurrence.  Follow.        Environmental allergies    Controlled with zyrtec.        Female bladder prolapse    Having persistent reported bladder prolapse as outlined.  Will refer to gyn for further evaluation and question of need for pessary.        Relevant Orders   Ambulatory referral to Gynecology    Hypercholesterolemia    On lipitor.  Low cholesterol diet and exercise.  Follow lipid panel and liver function tests.        Hypertension - Primary    Blood pressure on my check - 138-140/78-80.  Continue same medication regimen.  Feels the whole 68m dose controls the blood pressure better.  Follow.  Check met b.        Stress    Feels she is handling stress well.  Follow.  Desires no further intervention.          I spent 25 minutes with the patient and more than 50% of the time was spent in consultation regarding the above.     SEinar Pheasant MD

## 2015-02-10 NOTE — Assessment & Plan Note (Signed)
On lipitor.  Low cholesterol diet and exercise.  Follow lipid panel and liver function tests.   

## 2015-02-10 NOTE — Assessment & Plan Note (Signed)
Controlled with zyrtec 

## 2015-02-10 NOTE — Assessment & Plan Note (Signed)
Blood pressure on my check - 138-140/78-80.  Continue same medication regimen.  Feels the whole 44m dose controls the blood pressure better.  Follow.  Check met b.

## 2015-02-10 NOTE — Assessment & Plan Note (Signed)
No reoccurrence.  Follow.   

## 2015-02-10 NOTE — Assessment & Plan Note (Signed)
Having persistent reported bladder prolapse as outlined.  Will refer to gyn for further evaluation and question of need for pessary.

## 2015-04-12 ENCOUNTER — Other Ambulatory Visit: Payer: Self-pay | Admitting: *Deleted

## 2015-04-12 ENCOUNTER — Telehealth: Payer: Self-pay | Admitting: *Deleted

## 2015-04-12 MED ORDER — QUINAPRIL HCL 40 MG PO TABS: ORAL_TABLET | ORAL | Status: DC

## 2015-04-12 NOTE — Telephone Encounter (Signed)
Fax from pharmacy, needing PA for for Accupril. PA started online, pending response.

## 2015-04-26 ENCOUNTER — Encounter: Payer: Self-pay | Admitting: Internal Medicine

## 2015-04-27 ENCOUNTER — Ambulatory Visit (INDEPENDENT_AMBULATORY_CARE_PROVIDER_SITE_OTHER): Payer: Medicare Other | Admitting: Nurse Practitioner

## 2015-04-27 ENCOUNTER — Encounter: Payer: Self-pay | Admitting: Nurse Practitioner

## 2015-04-27 ENCOUNTER — Other Ambulatory Visit: Payer: Self-pay | Admitting: Nurse Practitioner

## 2015-04-27 VITALS — BP 130/74 | HR 79 | Temp 97.9°F | Ht 60.7 in | Wt 133.0 lb

## 2015-04-27 DIAGNOSIS — I499 Cardiac arrhythmia, unspecified: Secondary | ICD-10-CM | POA: Diagnosis not present

## 2015-04-27 DIAGNOSIS — E875 Hyperkalemia: Secondary | ICD-10-CM

## 2015-04-27 DIAGNOSIS — R0602 Shortness of breath: Secondary | ICD-10-CM

## 2015-04-27 DIAGNOSIS — R8290 Unspecified abnormal findings in urine: Secondary | ICD-10-CM | POA: Insufficient documentation

## 2015-04-27 DIAGNOSIS — R8299 Other abnormal findings in urine: Secondary | ICD-10-CM

## 2015-04-27 DIAGNOSIS — N8110 Cystocele, unspecified: Secondary | ICD-10-CM

## 2015-04-27 LAB — COMPREHENSIVE METABOLIC PANEL
ALT: 13 U/L (ref 0–35)
AST: 16 U/L (ref 0–37)
Albumin: 4.5 g/dL (ref 3.5–5.2)
Alkaline Phosphatase: 71 U/L (ref 39–117)
BUN: 24 mg/dL — ABNORMAL HIGH (ref 6–23)
CALCIUM: 9.5 mg/dL (ref 8.4–10.5)
CO2: 26 mEq/L (ref 19–32)
CREATININE: 1.08 mg/dL (ref 0.40–1.20)
Chloride: 103 mEq/L (ref 96–112)
GFR: 51.1 mL/min — ABNORMAL LOW (ref >60.00)
Glucose, Bld: 75 mg/dL (ref 70–99)
Potassium: 5.3 mEq/L — ABNORMAL HIGH (ref 3.5–5.1)
SODIUM: 136 meq/L (ref 135–145)
Total Bilirubin: 0.5 mg/dL (ref 0.2–1.2)
Total Protein: 7 g/dL (ref 6.0–8.3)

## 2015-04-27 LAB — POCT URINALYSIS DIPSTICK
Bilirubin, UA: NEGATIVE
Glucose, UA: NEGATIVE
KETONES UA: NEGATIVE
NITRITE UA: NEGATIVE
Protein, UA: NEGATIVE
Spec Grav, UA: 1.02
Urobilinogen, UA: 0.2
pH, UA: 5.5

## 2015-04-27 LAB — CBC WITH DIFFERENTIAL/PLATELET
BASOS ABS: 0 10*3/uL (ref 0.0–0.1)
Basophils Relative: 0.3 % (ref 0.0–3.0)
EOS PCT: 1.1 % (ref 0.0–5.0)
Eosinophils Absolute: 0.1 10*3/uL (ref 0.0–0.7)
HCT: 41.1 % (ref 36.0–46.0)
Hemoglobin: 13.7 g/dL (ref 12.0–15.0)
Lymphocytes Relative: 24.5 % (ref 12.0–46.0)
Lymphs Abs: 2 10*3/uL (ref 0.7–4.0)
MCHC: 33.4 g/dL (ref 30.0–36.0)
MCV: 90.1 fl (ref 78.0–100.0)
MONOS PCT: 6.5 % (ref 3.0–12.0)
Monocytes Absolute: 0.5 10*3/uL (ref 0.1–1.0)
Neutro Abs: 5.5 10*3/uL (ref 1.4–7.7)
Neutrophils Relative %: 67.6 % (ref 43.0–77.0)
PLATELETS: 293 10*3/uL (ref 150.0–400.0)
RBC: 4.56 Mil/uL (ref 3.87–5.11)
RDW: 13.9 % (ref 11.5–15.5)
WBC: 8.1 10*3/uL (ref 4.0–10.5)

## 2015-04-27 MED ORDER — CIPROFLOXACIN HCL 250 MG PO TABS: 250.0000 mg | ORAL_TABLET | Freq: Two times a day (BID) | ORAL | Status: DC

## 2015-04-27 NOTE — Patient Instructions (Signed)

## 2015-04-27 NOTE — Progress Notes (Signed)
Subjective:    Patient ID: Elizabeth [name], female    DOB: 10/08/1927, 81 y.o.   MRN: [id number]  HPI  Elizabeth [name] is a 81 yo female with a CC of "SOB" and "Dizziness", but she reports this is not accurate.   1) Husband had stroke and she cares for him and still works so she is under stress frequently, feels like she is going to faint then it goes away after a second. Happens when standing still or standing suddenly. Feels she forgets to breathe or like her heart drops a beat She denies feeling palpitations or tachycardia with these episodes, but describes on separate episode this week of waking up with tachycardia.   "Comes in bunches" she reports and is happening more frequently  25 years ago- "benign tachycardia"   She is currently taking 40 mg of Accupril daily  Allergies- Zyrtec occasionally and nasacort still taking daily Dr. Lenise Heraldobert McQueen did the cardiology work up years ago.   Still exercises 4 x a week on the treadmill without problems.   2)  Pessary 2 weeks in tomorrow and still causing discomfort    Review of Systems  Constitutional: Negative for fever, chills, diaphoresis and fatigue.  Respiratory: Negative for chest tightness, shortness of breath and wheezing.   Cardiovascular: Positive for palpitations. Negative for chest pain and leg swelling.  Gastrointestinal: Negative for nausea, vomiting and diarrhea.  Skin: Negative for rash.  Neurological: Negative for dizziness, weakness, numbness and headaches.  Psychiatric/Behavioral: The patient is not nervous/anxious.    Past Medical History  Diagnosis Date  . Irritable bowel syndrome   . Hyperlipidemia   . Hypertension     History   Social History  . Marital Status: Married    Spouse Name: N/A  . Number of Children: 2  . Years of Education: N/A   Occupational History  . Not on file.   Social History Main Topics  . Smoking status: Never Smoker   . Smokeless tobacco: Never Used  . Alcohol Use: No  . Drug  Use: No  . Sexual Activity: Not on file   Other Topics Concern  . Not on file   Social History Narrative    No past surgical history on file.  Family History  Problem Relation Age of Onset  . Stroke Father   . Transient ischemic attack Mother     multiple   . Heart disease Brother     MI - died age 79  . Leukemia Brother   . Lung cancer Brother   . Throat cancer Brother     had heart disease also  . Heart disease Sister     No Known Allergies  Current Outpatient Prescriptions on File Prior to Visit  Medication Sig Dispense Refill  . aspirin 81 MG tablet Take 81 mg by mouth daily.    Marland Kitchen. atorvastatin (LIPITOR) 40 MG tablet Take 1 tablet (40 mg total) by mouth daily. Name brand only. 30 tablet 5  . Cholecalciferol (VITAMIN D-3) 1000 UNITS CAPS Take by mouth.    . quinapril (ACCUPRIL) 40 MG tablet Needs name brand only 30 tablet 5  . triamcinolone (NASACORT) 55 MCG/ACT nasal inhaler Place 2 sprays into the nose daily as needed.     . cetirizine (ZYRTEC) 10 MG tablet Take 10 mg by mouth daily as needed.      No current facility-administered medications on file prior to visit.      Objective:   Physical Exam  Constitutional:  She is oriented to person, place, and time. She appears well-developed and well-nourished. No distress.  BP 130/74 mmHg  Pulse 79  Temp(Src) 97.9 F (36.6 C) (Oral)  Ht 5' 0.7" (1.542 m)  Wt 133 lb (60.328 kg)  BMI 25.37 kg/m2  SpO2 98%   HENT:  Head: Normocephalic and atraumatic.  Right Ear: External ear normal.  Left Ear: External ear normal.  Eyes: EOM are normal. Pupils are equal, round, and reactive to light. Right eye exhibits no discharge. Left eye exhibits no discharge. No scleral icterus.  Cardiovascular: Normal rate, regular rhythm and normal heart sounds.  Exam reveals no gallop and no friction rub.   Pulmonary/Chest: Effort normal and breath sounds normal. No respiratory distress. She has no wheezes. She has no rales. She exhibits no  tenderness.  Neurological: She is alert and oriented to person, place, and time. Coordination normal.  Skin: Skin is warm and dry. No rash noted. She is not diaphoretic.  Psychiatric: She has a normal mood and affect. Her behavior is normal. Judgment and thought content normal.      Assessment & Plan:

## 2015-04-27 NOTE — Assessment & Plan Note (Signed)
EKG done. SOB is intermittent.

## 2015-04-27 NOTE — Progress Notes (Signed)
Pre visit review using our clinic review tool, if applicable. No additional management support is needed unless otherwise documented below in the visit note. 

## 2015-04-27 NOTE — Assessment & Plan Note (Signed)
Pt has hx of elusive arrhythmias. Last holter monitor was worn 10 years ago. Low voltage and poor r wave progression. Referral placed to Dr. Mariah MillingGollan who is her husband's cardiologist. Spoke with Dr. Lorin PicketScott and she feels this is a good option at this time due to increase in frequency of symptoms.

## 2015-04-27 NOTE — Assessment & Plan Note (Addendum)
Pt has a pessary. Urine cloudier recently. POCT urine shows possible infection. Will send for culture and start on abx. Will follow up in 4 wks.

## 2015-04-27 NOTE — Assessment & Plan Note (Signed)
Hx of hyperkalemia. Obtaining CMET today.

## 2015-04-28 ENCOUNTER — Other Ambulatory Visit: Payer: Self-pay | Admitting: Nurse Practitioner

## 2015-04-28 DIAGNOSIS — E875 Hyperkalemia: Secondary | ICD-10-CM

## 2015-04-29 ENCOUNTER — Other Ambulatory Visit (INDEPENDENT_AMBULATORY_CARE_PROVIDER_SITE_OTHER): Payer: Medicare Other

## 2015-04-29 DIAGNOSIS — R5383 Other fatigue: Secondary | ICD-10-CM

## 2015-04-29 DIAGNOSIS — E875 Hyperkalemia: Secondary | ICD-10-CM

## 2015-04-29 DIAGNOSIS — E7800 Pure hypercholesterolemia, unspecified: Secondary | ICD-10-CM

## 2015-04-29 DIAGNOSIS — I10 Essential (primary) hypertension: Secondary | ICD-10-CM

## 2015-04-29 LAB — CBC WITH DIFFERENTIAL/PLATELET
Basophils Absolute: 0 10*3/uL (ref 0.0–0.1)
Basophils Relative: 0.4 % (ref 0.0–3.0)
EOS PCT: 2.1 % (ref 0.0–5.0)
Eosinophils Absolute: 0.2 10*3/uL (ref 0.0–0.7)
HCT: 40.9 % (ref 36.0–46.0)
Hemoglobin: 13.6 g/dL (ref 12.0–15.0)
LYMPHS PCT: 24.3 % (ref 12.0–46.0)
Lymphs Abs: 2.1 10*3/uL (ref 0.7–4.0)
MCHC: 33.3 g/dL (ref 30.0–36.0)
MCV: 89.6 fl (ref 78.0–100.0)
MONO ABS: 0.6 10*3/uL (ref 0.1–1.0)
MONOS PCT: 6.6 % (ref 3.0–12.0)
NEUTROS PCT: 66.6 % (ref 43.0–77.0)
Neutro Abs: 5.7 10*3/uL (ref 1.4–7.7)
PLATELETS: 270 10*3/uL (ref 150.0–400.0)
RBC: 4.56 Mil/uL (ref 3.87–5.11)
RDW: 14.3 % (ref 11.5–15.5)
WBC: 8.6 10*3/uL (ref 4.0–10.5)

## 2015-04-29 LAB — HEPATIC FUNCTION PANEL
ALT: 12 U/L (ref 0–35)
AST: 15 U/L (ref 0–37)
Albumin: 4.2 g/dL (ref 3.5–5.2)
Alkaline Phosphatase: 68 U/L (ref 39–117)
BILIRUBIN DIRECT: 0.1 mg/dL (ref 0.0–0.3)
BILIRUBIN TOTAL: 0.4 mg/dL (ref 0.2–1.2)
Total Protein: 6.6 g/dL (ref 6.0–8.3)

## 2015-04-29 LAB — BASIC METABOLIC PANEL
BUN: 24 mg/dL — AB (ref 6–23)
CO2: 23 mEq/L (ref 19–32)
CREATININE: 1.07 mg/dL (ref 0.40–1.20)
Calcium: 9.3 mg/dL (ref 8.4–10.5)
Chloride: 106 mEq/L (ref 96–112)
GFR: 51.65 mL/min — AB (ref >60.00)
Glucose, Bld: 66 mg/dL — ABNORMAL LOW (ref 70–99)
Potassium: 4.7 mEq/L (ref 3.5–5.1)
Sodium: 138 mEq/L (ref 135–145)

## 2015-04-29 LAB — POTASSIUM: Potassium: 4.7 mEq/L (ref 3.5–5.1)

## 2015-04-29 LAB — URINE CULTURE: Colony Count: 75000

## 2015-04-29 LAB — LIPID PANEL
Cholesterol: 171 mg/dL (ref 0–200)
HDL: 49.3 mg/dL (ref >39.00)
LDL Cholesterol: 97 mg/dL (ref 0–99)
NonHDL: 121.7
TRIGLYCERIDES: 122 mg/dL (ref 0.0–149.0)
Total CHOL/HDL Ratio: 3
VLDL: 24.4 mg/dL (ref 0.0–40.0)

## 2015-04-29 LAB — TSH: TSH: 1.93 u[IU]/mL (ref 0.35–4.50)

## 2015-04-30 ENCOUNTER — Encounter: Payer: Self-pay | Admitting: Internal Medicine

## 2015-05-11 ENCOUNTER — Encounter: Payer: Self-pay | Admitting: Internal Medicine

## 2015-06-09 ENCOUNTER — Encounter: Payer: Self-pay | Admitting: Cardiovascular Disease

## 2015-06-09 ENCOUNTER — Ambulatory Visit (INDEPENDENT_AMBULATORY_CARE_PROVIDER_SITE_OTHER): Payer: Medicare Other | Admitting: Cardiovascular Disease

## 2015-06-09 VITALS — BP 122/62 | HR 78 | Ht 61.5 in | Wt 132.2 lb

## 2015-06-09 DIAGNOSIS — E7800 Pure hypercholesterolemia, unspecified: Secondary | ICD-10-CM

## 2015-06-09 DIAGNOSIS — E875 Hyperkalemia: Secondary | ICD-10-CM

## 2015-06-09 DIAGNOSIS — R0602 Shortness of breath: Secondary | ICD-10-CM

## 2015-06-09 DIAGNOSIS — I499 Cardiac arrhythmia, unspecified: Secondary | ICD-10-CM | POA: Diagnosis not present

## 2015-06-09 DIAGNOSIS — I10 Essential (primary) hypertension: Secondary | ICD-10-CM

## 2015-06-09 NOTE — Progress Notes (Signed)
Patient ID: Elizabeth [name], female    DOB: December 29, 1926, 81 y.o.   MRN: [id number]  HPI Comments: Elizabeth [name] is a very pleasant 81 year old woman with history of hypertension, hyperlipidemia who presents for evaluation of arrhythmia, lightheadedness.  She reports a 20 year history of heart arrhythmia, previously seen by Dr. Jenne Campus with stress testing, echocardiogram, Holter monitor at that time. She reports that the Holter monitor was relatively unrevealing. She was told in the past that she had a heart murmur, echocardiogram showing valve calcification. Follow-up echocardiogram 2 by her report did not confirm any valve disease. She passed the stress test with "flying colors"  She is very active at baseline, helps to take care of her husband. She also continues to work. She reports having very rare episodes of lightheadedness that are very short-lived, typically presenting when she stands up or after standing for period of time. Rare palpitations. As these have been going on for 20 years, she has got used to these. They are perhaps slightly worse over the past 10 years sometimes she feels that she has difficulty catching her breath. This happens while at rest. She is always been told that she is full of "adrenaline"   she brings blood pressure measurements from home over the past several months. Typically systolic pressures in the 90s to low 100 range. She was previously suggested that she decrease her quinapril down to 20 mg daily. She did this and reports having elevated blood pressures at nighttime but she does not check this on a regular basis.  EKG on today's visit shows normal sinus rhythm with rate 79 bpm, no significant ST or T-wave changes       No Known Allergies  Current Outpatient Prescriptions on File Prior to Visit  Medication Sig Dispense Refill  . aspirin 81 MG tablet Take 81 mg by mouth daily.    Marland Kitchen atorvastatin (LIPITOR) 40 MG tablet Take 1 tablet (40 mg total) by mouth  daily. Name brand only. 30 tablet 5  . cetirizine (ZYRTEC) 10 MG tablet Take 10 mg by mouth daily as needed.     . Cholecalciferol (VITAMIN D-3) 1000 UNITS CAPS Take by mouth.    . quinapril (ACCUPRIL) 40 MG tablet Needs name brand only 30 tablet 5  . triamcinolone (NASACORT) 55 MCG/ACT nasal inhaler Place 2 sprays into the nose daily as needed.      No current facility-administered medications on file prior to visit.    Past Medical History  Diagnosis Date  . Irritable bowel syndrome   . Hyperlipidemia   . Hypertension     History reviewed. No pertinent past surgical history.  Social History  reports that she has never smoked. She has never used smokeless tobacco. She reports that she does not drink alcohol or use illicit drugs.  Family History family history includes Heart disease in her brother and sister; Leukemia in her brother; Lung cancer in her brother; Stroke in her father; Throat cancer in her brother; Transient ischemic attack in her mother.   Review of Systems  Constitutional: Negative.   Respiratory: Positive for shortness of breath.   Cardiovascular: Positive for palpitations.  Gastrointestinal: Negative.   Musculoskeletal: Negative.   Neurological: Negative.   Hematological: Negative.   Psychiatric/Behavioral: Negative.   All other systems reviewed and are negative.   BP 122/62 mmHg  Pulse 78  Ht 5' 1.5" (1.562 m)  Wt 132 lb 4 oz (59.988 kg)  BMI 24.59 kg/m2  Physical Exam  Constitutional:  She is oriented to person, place, and time. She appears well-developed and well-nourished.  HENT:  Head: Normocephalic.  Nose: Nose normal.  Mouth/Throat: Oropharynx is clear and moist.  Eyes: Conjunctivae are normal. Pupils are equal, round, and reactive to light.  Neck: Normal range of motion. Neck supple. No JVD present.  Cardiovascular: Normal rate, regular rhythm, normal heart sounds and intact distal pulses.  Exam reveals no gallop and no friction rub.   No  murmur heard. Pulmonary/Chest: Effort normal and breath sounds normal. No respiratory distress. She has no wheezes. She has no rales. She exhibits no tenderness.  Abdominal: Soft. Bowel sounds are normal. She exhibits no distension. There is no tenderness.  Musculoskeletal: Normal range of motion. She exhibits no edema or tenderness.  Lymphadenopathy:    She has no cervical adenopathy.  Neurological: She is alert and oriented to person, place, and time. Coordination normal.  Skin: Skin is warm and dry. No rash noted. No erythema.  Psychiatric: She has a normal mood and affect. Her behavior is normal. Judgment and thought content normal.

## 2015-06-09 NOTE — Assessment & Plan Note (Signed)
She presents with a list of her blood pressures over the past several months. There are frequent systolic pressures in the 90s. She has been reluctant to decrease the quinapril as she reports having systolic pressure 160 in the evenings. No documentation of this. All of her blood pressures are typically very low in the morning, 120 up to 130 later in the day on a treadmill. Again suggested she decrease the quinapril down to 20 g daily and monitor her blood pressure. She will bring these into the office when she comes with her husband in several weeks' time for his appointment

## 2015-06-09 NOTE — Assessment & Plan Note (Signed)
Unclear if she is having any arrhythmia versus orthostatic hypotension. Recommended she decrease her quinapril given her low blood pressures If she continues to have episodes of lightheadedness or palpitations, 30 day monitor could be ordered. Unable to exclude ectopy Otherwise she is very active, good ADLs

## 2015-06-09 NOTE — Assessment & Plan Note (Signed)
Previously had potassium 5.3. This may improve with lower dose quinapril

## 2015-06-09 NOTE — Assessment & Plan Note (Signed)
Rare episodes of feeling that she can't catch her breath typically at rest. Normal clinical exam, normal EKG. Normal echocardiogram in the past and normal stress testing per the patient. Likely noncardiac. She is otherwise very active, good exercise tolerance. No further testing at this time

## 2015-06-09 NOTE — Assessment & Plan Note (Signed)
Cholesterol is at goal on the current lipid regimen. No changes to the medications were made.  

## 2015-06-09 NOTE — Patient Instructions (Signed)
You are doing well.  Blood pressure is low: Please cut the quinipril in 1/2 daily  Please call us if you have new issues that need to be addressed before your next appt.  Your physician wants you to follow-up in: 6 months.  You will receive a reminder letter in the mail two months in advance. If you don't receive a letter, please call our office to schedule the follow-up appointment.

## 2015-06-23 ENCOUNTER — Ambulatory Visit (INDEPENDENT_AMBULATORY_CARE_PROVIDER_SITE_OTHER): Payer: Medicare Other | Admitting: Obstetrics and Gynecology

## 2015-06-23 ENCOUNTER — Encounter: Payer: Self-pay | Admitting: Obstetrics and Gynecology

## 2015-06-23 VITALS — BP 114/72 | HR 60 | Ht 61.5 in | Wt 133.9 lb

## 2015-06-23 DIAGNOSIS — IMO0001 Reserved for inherently not codable concepts without codable children: Secondary | ICD-10-CM

## 2015-06-23 DIAGNOSIS — IMO0002 Reserved for concepts with insufficient information to code with codable children: Secondary | ICD-10-CM

## 2015-06-23 DIAGNOSIS — Z4689 Encounter for fitting and adjustment of other specified devices: Secondary | ICD-10-CM

## 2015-06-23 DIAGNOSIS — N8110 Cystocele, unspecified: Secondary | ICD-10-CM | POA: Diagnosis not present

## 2015-06-23 DIAGNOSIS — N812 Incomplete uterovaginal prolapse: Secondary | ICD-10-CM | POA: Diagnosis not present

## 2015-06-26 DIAGNOSIS — N812 Incomplete uterovaginal prolapse: Secondary | ICD-10-CM | POA: Insufficient documentation

## 2015-06-26 DIAGNOSIS — N8110 Cystocele, unspecified: Secondary | ICD-10-CM | POA: Insufficient documentation

## 2015-06-26 NOTE — Progress Notes (Signed)
GYNECOLOGY PROGRESS NOTE  Subjective:    Patient ID: Elizabeth [name], female    DOB: 07/03/1927, 81 y.o.   MRN: [id number]  HPI  Patient is a 81 y.o. 182P2002 female who presents for pessary check.  Denies complaints.  Notes that bowel movements have improved (no constipation). Is using Estrace cream and Trimosan gel as prescribed.    The following portions of the patient's history were reviewed and updated as appropriate: allergies, current medications, past family history, past medical history, past social history, past surgical history and problem list.   Review of Systems Pertinent items are noted in HPI.   Objective:   Blood pressure 114/72, pulse 60, height 5' 1.5" (1.562 m), weight 133 lb 14.4 oz (60.737 kg). General appearance: alert and no distress Abdomen: soft, non-tender; bowel sounds normal; no masses,  no organomegaly Pelvic: external genitalia normal and vagina normal without discharge.  Pessary removed, vagina inspected, no lesions or erosion present.  Pessary cleaned and reinserted.   Extremities: extremities normal, atraumatic, no cyanosis or edema Neurologic: Grossly normal   Assessment:   Pessary maintenance  Plan:   Gelhorn size 2 3/4 removed, cleaned, and replaced without difficulty. Continue use of Estrace and Trimosan gel as prescribed.  RTC in 3-4 months for next pessary check.    Hildred LaserAnika Carel Schnee, MD [facility]

## 2015-07-12 ENCOUNTER — Encounter: Payer: Medicare Other | Admitting: Internal Medicine

## 2015-07-12 ENCOUNTER — Other Ambulatory Visit: Payer: Medicare Other

## 2015-07-19 ENCOUNTER — Encounter: Payer: Self-pay | Admitting: Internal Medicine

## 2015-07-19 ENCOUNTER — Ambulatory Visit (INDEPENDENT_AMBULATORY_CARE_PROVIDER_SITE_OTHER): Payer: Medicare Other | Admitting: Internal Medicine

## 2015-07-19 VITALS — BP 120/78 | HR 81 | Temp 98.3°F | Ht 61.0 in | Wt 134.0 lb

## 2015-07-19 DIAGNOSIS — E875 Hyperkalemia: Secondary | ICD-10-CM

## 2015-07-19 DIAGNOSIS — IMO0002 Reserved for concepts with insufficient information to code with codable children: Secondary | ICD-10-CM

## 2015-07-19 DIAGNOSIS — F439 Reaction to severe stress, unspecified: Secondary | ICD-10-CM

## 2015-07-19 DIAGNOSIS — E7800 Pure hypercholesterolemia, unspecified: Secondary | ICD-10-CM

## 2015-07-19 DIAGNOSIS — Z0000 Encounter for general adult medical examination without abnormal findings: Secondary | ICD-10-CM

## 2015-07-19 DIAGNOSIS — I10 Essential (primary) hypertension: Secondary | ICD-10-CM

## 2015-07-19 DIAGNOSIS — IMO0001 Reserved for inherently not codable concepts without codable children: Secondary | ICD-10-CM

## 2015-07-19 DIAGNOSIS — Z9109 Other allergy status, other than to drugs and biological substances: Secondary | ICD-10-CM

## 2015-07-19 MED ORDER — QUINAPRIL HCL 40 MG PO TABS: ORAL_TABLET | ORAL | Status: DC

## 2015-07-19 MED ORDER — ATORVASTATIN CALCIUM 40 MG PO TABS: 40.0000 mg | ORAL_TABLET | Freq: Every day | ORAL | Status: DC

## 2015-07-19 NOTE — Progress Notes (Signed)
Patient ID: Elizabeth [name], female   DOB: [birth year], 81 y.o.   MRN: [id number]   Subjective:    Patient ID: Elizabeth [name], female    DOB: 01-21-1927, 81 y.o.   MRN: [id number]  HPI  Patient here to follow up on her current medical issues as well as for her physical exam.  Tries to stay active.  Exercises four days per week.  No cardiac symptoms with increased activity or exertion.  No sob.  Just evaluated by Dr Mariah Milling.  Felt from cardiac standpoint, everything ok.  She has a pessary in place.  Seeing gyn.  The stem of the pessary is long.  Some pressure with sitting.  Does help with her bladder and her stool.  Eating and drinking well.  Some increased stress with her husband's issues.  Does not feel she needs any further intervention at this point.     Past Medical History  Diagnosis Date  . Irritable bowel syndrome   . Hyperlipidemia   . Hypertension   . Cystocele or rectocele with incomplete uterine prolapse   . Vaginal atrophy     Outpatient Encounter Prescriptions as of 07/19/2015  Medication Sig  . aspirin 81 MG tablet Take 81 mg by mouth daily.  Marland Kitchen atorvastatin (LIPITOR) 40 MG tablet Take 1 tablet (40 mg total) by mouth daily. Name brand only.  . cetirizine (ZYRTEC) 10 MG tablet Take 10 mg by mouth daily as needed.   . Cholecalciferol (VITAMIN D-3) 1000 UNITS CAPS Take by mouth.  . quinapril (ACCUPRIL) 40 MG tablet Needs name brand only  . triamcinolone (NASACORT) 55 MCG/ACT nasal inhaler Place 2 sprays into the nose daily as needed.   . [DISCONTINUED] atorvastatin (LIPITOR) 40 MG tablet Take 1 tablet (40 mg total) by mouth daily. Name brand only.  . [DISCONTINUED] quinapril (ACCUPRIL) 40 MG tablet Needs name brand only   No facility-administered encounter medications on file as of 07/19/2015.    Review of Systems  Constitutional: Negative for appetite change and unexpected weight change.  HENT: Negative for congestion and sinus pressure.   Eyes: Negative for pain and visual  disturbance.  Respiratory: Negative for cough, chest tightness and shortness of breath.   Cardiovascular: Negative for chest pain, palpitations and leg swelling.  Gastrointestinal: Negative for nausea, vomiting, abdominal pain and diarrhea.  Genitourinary: Negative for dysuria.       Urination better since pessary placement.   Musculoskeletal: Negative for back pain and joint swelling.  Skin: Negative for color change and rash.  Neurological: Negative for dizziness, light-headedness and headaches.  Hematological: Negative for adenopathy. Does not bruise/bleed easily.  Psychiatric/Behavioral: Negative for dysphoric mood and agitation.       Objective:    Physical Exam  Constitutional: She is oriented to person, place, and time. She appears well-developed and well-nourished.  HENT:  Nose: Nose normal.  Mouth/Throat: Oropharynx is clear and moist.  Eyes: Right eye exhibits no discharge. Left eye exhibits no discharge. No scleral icterus.  Neck: Neck supple. No thyromegaly present.  Cardiovascular: Normal rate and regular rhythm.   Pulmonary/Chest: Breath sounds normal. No accessory muscle usage. No tachypnea. No respiratory distress. She has no decreased breath sounds. She has no wheezes. She has no rhonchi. Right breast exhibits no inverted nipple, no mass, no nipple discharge and no tenderness (no axillary adenopathy). Left breast exhibits no inverted nipple, no mass, no nipple discharge and no tenderness (no axilarry adenopathy).  Abdominal: Soft. Bowel sounds are normal. There is no  tenderness.  Musculoskeletal: She exhibits no edema or tenderness.  Lymphadenopathy:    She has no cervical adenopathy.  Neurological: She is alert and oriented to person, place, and time.  Skin: Skin is warm. No rash noted.  Psychiatric: She has a normal mood and affect. Her behavior is normal.    BP 120/78 mmHg  Pulse 81  Temp(Src) 98.3 F (36.8 C) (Oral)  Ht  (1.549 m)  Wt 134 lb (60.782  kg)  BMI 25.33 kg/m2  SpO2 97% Wt Readings from Last 3 Encounters:  07/19/15 134 lb (60.782 kg)  06/23/15 133 lb 14.4 oz (60.737 kg)  06/09/15 132 lb 4 oz (59.988 kg)     Lab Results  Component Value Date   WBC 8.6 04/29/2015   HGB 13.6 04/29/2015   HCT 40.9 04/29/2015   PLT 270.0 04/29/2015   GLUCOSE 66* 04/29/2015   CHOL 171 04/29/2015   TRIG 122.0 04/29/2015   HDL 49.30 04/29/2015   LDLDIRECT 178.4 11/04/2013   LDLCALC 97 04/29/2015   ALT 12 04/29/2015   AST 15 04/29/2015   NA 138 04/29/2015   K 4.7 04/29/2015   K 4.7 04/29/2015   CL 106 04/29/2015   CREATININE 1.07 04/29/2015   BUN 24* 04/29/2015   CO2 23 04/29/2015   TSH 1.93 04/29/2015       Assessment & Plan:   Problem List Items Addressed This Visit    Cystocele, grade 3    Seeing gyn.  Has pessary in place.       Environmental allergies    Controlled with zyrtec.       Health care maintenance    Physical today.  Declines mammogram.        Hypercholesterolemia    On lipitor.  Low cholesterol diet and exercise.  Follow lipid panel and liver function tests.       Relevant Medications   atorvastatin (LIPITOR) 40 MG tablet   quinapril (ACCUPRIL) 40 MG tablet   Other Relevant Orders   Hepatic function panel   Lipid panel   Hyperkalemia    Last potassium check wnl.       Hypertension    Blood pressure at times runs low.  She tried taking 1/2 quinapril.  Blood pressure elevates on the  dose.  Discussed taking the  dose at a different time of the day.  Tends to run low in the am. Continue same medication regimen.  Follow pressures.  Follow metabolic panel.        Relevant Medications   atorvastatin (LIPITOR) 40 MG tablet   quinapril (ACCUPRIL) 40 MG tablet   Other Relevant Orders   Basic metabolic panel   Stress    Does not feel she needs any further intervention.        Other Visit Diagnoses    Routine general medical examination at a health care facility    -  Primary         Dale [place], MD

## 2015-07-19 NOTE — Progress Notes (Signed)
Pre visit review using our clinic review tool, if applicable. No additional management support is needed unless otherwise documented below in the visit note. 

## 2015-07-22 ENCOUNTER — Encounter: Payer: Self-pay | Admitting: Internal Medicine

## 2015-07-22 DIAGNOSIS — Z0000 Encounter for general adult medical examination without abnormal findings: Secondary | ICD-10-CM | POA: Insufficient documentation

## 2015-07-22 NOTE — Assessment & Plan Note (Signed)
Blood pressure at times runs low.  She tried taking 1/2 quinapril.  Blood pressure elevates on the  dose.  Discussed taking the  dose at a different time of the day.  Tends to run low in the am. Continue same medication regimen.  Follow pressures.  Follow metabolic panel.

## 2015-07-22 NOTE — Assessment & Plan Note (Signed)
Physical today.  Declines mammogram.  

## 2015-07-22 NOTE — Assessment & Plan Note (Signed)
Controlled with zyrtec 

## 2015-07-22 NOTE — Assessment & Plan Note (Signed)
Last potassium check wnl.  

## 2015-07-22 NOTE — Assessment & Plan Note (Signed)
Seeing gyn.  Has pessary in place.

## 2015-07-22 NOTE — Assessment & Plan Note (Signed)
On lipitor.  Low cholesterol diet and exercise.  Follow lipid panel and liver function tests.   

## 2015-07-22 NOTE — Assessment & Plan Note (Signed)
Does not feel she needs any further intervention.

## 2015-08-08 ENCOUNTER — Other Ambulatory Visit (INDEPENDENT_AMBULATORY_CARE_PROVIDER_SITE_OTHER): Payer: Medicare Other

## 2015-08-08 DIAGNOSIS — E7800 Pure hypercholesterolemia, unspecified: Secondary | ICD-10-CM

## 2015-08-08 DIAGNOSIS — E875 Hyperkalemia: Secondary | ICD-10-CM | POA: Diagnosis not present

## 2015-08-08 DIAGNOSIS — I10 Essential (primary) hypertension: Secondary | ICD-10-CM

## 2015-08-08 LAB — BASIC METABOLIC PANEL
BUN: 20 mg/dL (ref 6–23)
CALCIUM: 9.1 mg/dL (ref 8.4–10.5)
CO2: 24 mEq/L (ref 19–32)
CREATININE: 0.95 mg/dL (ref 0.40–1.20)
Chloride: 103 mEq/L (ref 96–112)
GFR: 59.21 mL/min — ABNORMAL LOW (ref >60.00)
Glucose, Bld: 91 mg/dL (ref 70–99)
Potassium: 4.5 mEq/L (ref 3.5–5.1)
Sodium: 137 mEq/L (ref 135–145)

## 2015-08-08 LAB — HEPATIC FUNCTION PANEL
ALK PHOS: 67 U/L (ref 39–117)
ALT: 17 U/L (ref 0–35)
AST: 18 U/L (ref 0–37)
Albumin: 4.1 g/dL (ref 3.5–5.2)
BILIRUBIN DIRECT: 0.1 mg/dL (ref 0.0–0.3)
TOTAL PROTEIN: 6.4 g/dL (ref 6.0–8.3)
Total Bilirubin: 0.5 mg/dL (ref 0.2–1.2)

## 2015-08-08 LAB — LIPID PANEL
CHOL/HDL RATIO: 3
Cholesterol: 164 mg/dL (ref 0–200)
HDL: 47.7 mg/dL (ref >39.00)
LDL Cholesterol: 96 mg/dL (ref 0–99)
NonHDL: 116.21
TRIGLYCERIDES: 100 mg/dL (ref 0.0–149.0)
VLDL: 20 mg/dL (ref 0.0–40.0)

## 2015-08-09 ENCOUNTER — Encounter: Payer: Self-pay | Admitting: Internal Medicine

## 2015-09-22 ENCOUNTER — Ambulatory Visit (INDEPENDENT_AMBULATORY_CARE_PROVIDER_SITE_OTHER): Payer: Medicare Other | Admitting: Obstetrics and Gynecology

## 2015-09-22 ENCOUNTER — Encounter: Payer: Self-pay | Admitting: Obstetrics and Gynecology

## 2015-09-22 VITALS — BP 119/70 | HR 80 | Ht 61.5 in | Wt 133.5 lb

## 2015-09-22 DIAGNOSIS — Z4689 Encounter for fitting and adjustment of other specified devices: Secondary | ICD-10-CM

## 2015-09-22 DIAGNOSIS — Z960 Presence of urogenital implants: Secondary | ICD-10-CM | POA: Insufficient documentation

## 2015-09-22 DIAGNOSIS — Z9889 Other specified postprocedural states: Secondary | ICD-10-CM

## 2015-09-22 NOTE — Progress Notes (Signed)
GYNECOLOGY PROGRESS NOTE  Subjective:    Patient ID: Elizabeth [name], female    DOB: 15-Dec-1927, 81 y.o.   MRN: [id number]  HPI  Patient is a 81 y.o. G16P2002 female who presents for pessary check. She reports no vaginal bleeding or discharge. She denies pelvic discomfort and difficulty urinating.  Does note occasional episodes of mild constipation, relieved by increasing fluids and fiber.    The following portions of the patient's history were reviewed and updated as appropriate: allergies, current medications, past family history, past medical history, past social history, past surgical history and problem list.   Review of Systems Pertinent items are noted in HPI.   Objective:    Blood pressure 119/70, pulse 80, height 5' 1.5" (1.562 m), weight 133 lb 8 oz (60.555 kg). General appearance: alert and no distress Pelvis: The patient's  Size 2 3/4 Gelhorn pessary was removed, cleaned and replaced without complications. Speculum examination revealed normal vaginal mucosa with no lesions or lacerations.  Assessment:   Pessary maintenance Cystocele grade 3, Uterine prolapse Grade 1  Plan:   Gelhorn size 2 3/4 removed, cleaned, and replaced without difficulty. Continue use of Estrace and Trimosan gel as prescribed.  RTC in 3-4 months for next pessary check.    Hildred Laser, MD [facility]

## 2015-10-19 ENCOUNTER — Other Ambulatory Visit: Payer: Self-pay | Admitting: *Deleted

## 2015-10-19 MED ORDER — QUINAPRIL HCL 40 MG PO TABS: ORAL_TABLET | ORAL | Status: DC

## 2015-10-25 ENCOUNTER — Encounter: Payer: Self-pay | Admitting: Internal Medicine

## 2015-12-13 ENCOUNTER — Ambulatory Visit (INDEPENDENT_AMBULATORY_CARE_PROVIDER_SITE_OTHER): Payer: Medicare Other | Admitting: Cardiovascular Disease

## 2015-12-13 ENCOUNTER — Encounter: Payer: Self-pay | Admitting: Cardiovascular Disease

## 2015-12-13 VITALS — BP 120/64 | HR 74 | Ht 61.5 in | Wt 134.5 lb

## 2015-12-13 DIAGNOSIS — I10 Essential (primary) hypertension: Secondary | ICD-10-CM | POA: Diagnosis not present

## 2015-12-13 DIAGNOSIS — I499 Cardiac arrhythmia, unspecified: Secondary | ICD-10-CM

## 2015-12-13 DIAGNOSIS — E7800 Pure hypercholesterolemia, unspecified: Secondary | ICD-10-CM | POA: Diagnosis not present

## 2015-12-13 NOTE — Progress Notes (Signed)
Patient ID: Elizabeth [name], female    DOB: 11/04/1927, 81 y.o.   MRN: [id number]  HPI Comments: Elizabeth [name] is a very pleasant 81 year old woman with history of hypertension, hyperlipidemia who presents for evaluation of arrhythmia, lightheadedness.  In follow-up today, she reports that she is doing well. She is not bothered by very short runs of palpitations, she has had these for 20 years She does not want medications for this, reports they are very brief, rare  She has not had any lightheadedness or near syncope. She feels it was from standing in place for hours at a time while she was painting She has not been doing this activity recently, not had any problems  Denies any other new symptoms,Uses the treadmill 4-5 times per week No chest pain, no shortness of breath on exertion  overall feels well She tried a lower dose of benazepril, reported her systolic pressure was 170 in the evenings on 20 mg daily, went back to 40 mg daily   EKG on today's visit shows normal sinus rhythm, rate 74 bpm, poor R-wave progression to the anterior precordial leads, left axis deviation   cholesterol discussed with her, total cholesterol 164, LDL 96  Other past medical history She reports a 20 year history of heart arrhythmia, previously seen by Dr. Jenne Campus with stress testing, echocardiogram, Holter monitor at that time. She reports that the Holter monitor was relatively unrevealing. She was told in the past that she had a heart murmur, echocardiogram showing valve calcification. Follow-up echocardiogram 2 by her report did not confirm any valve disease. She passed the stress test with "flying colors"  She is very active at baseline, helps to take care of her husband. She also continues to work. She reports having very rare episodes of lightheadedness that are very short-lived, typically presenting when she stands up or after standing for period of time. Rare palpitations. As these have been going on for 20  years, she has got used to these. They are perhaps slightly worse over the past 10 years sometimes she feels that she has difficulty catching her breath. This happens while at rest. She is always been told that she is full of "adrenaline"     No Known Allergies  Current Outpatient Prescriptions on File Prior to Visit  Medication Sig Dispense Refill  . aspirin 81 MG tablet Take 81 mg by mouth daily.    Marland Kitchen atorvastatin (LIPITOR) 40 MG tablet Take 1 tablet (40 mg total) by mouth daily. Name brand only. 30 tablet 5  . cetirizine (ZYRTEC) 10 MG tablet Take 10 mg by mouth daily as needed.     . Cholecalciferol (VITAMIN D-3) 1000 UNITS CAPS Take by mouth.    . quinapril (ACCUPRIL) 40 MG tablet Needs name brand only 90 tablet 3  . triamcinolone (NASACORT) 55 MCG/ACT nasal inhaler Place 2 sprays into the nose daily as needed.      No current facility-administered medications on file prior to visit.    Past Medical History  Diagnosis Date  . Irritable bowel syndrome   . Hyperlipidemia   . Hypertension   . Cystocele or rectocele with incomplete uterine prolapse   . Vaginal atrophy     Past Surgical History  Procedure Laterality Date  . Cataract extraction  2006    Social History  reports that she has never smoked. She has never used smokeless tobacco. She reports that she does not drink alcohol or use illicit drugs.  Family History family history  includes Heart disease in her brother and sister; Leukemia in her brother; Lung cancer in her brother; Stroke in her father; Throat cancer in her brother; Transient ischemic attack in her mother.   Review of Systems  Constitutional: Negative.   Respiratory: Positive for shortness of breath.   Cardiovascular: Positive for palpitations.  Gastrointestinal: Negative.   Musculoskeletal: Negative.   Neurological: Negative.   Hematological: Negative.   Psychiatric/Behavioral: Negative.   All other systems reviewed and are negative.   BP  120/64 mmHg  Pulse 74  Ht 5' 1.5" (1.562 m)  Wt 134 lb 8 oz (61.009 kg)  BMI 25.01 kg/m2  Physical Exam  Constitutional: She is oriented to person, place, and time. She appears well-developed and well-nourished.  HENT:  Head: Normocephalic.  Nose: Nose normal.  Mouth/Throat: Oropharynx is clear and moist.  Eyes: Conjunctivae are normal. Pupils are equal, round, and reactive to light.  Neck: Normal range of motion. Neck supple. No JVD present.  Cardiovascular: Normal rate, regular rhythm, normal heart sounds and intact distal pulses.  Exam reveals no gallop and no friction rub.   No murmur heard. Pulmonary/Chest: Effort normal and breath sounds normal. No respiratory distress. She has no wheezes. She has no rales. She exhibits no tenderness.  Abdominal: Soft. Bowel sounds are normal. She exhibits no distension. There is no tenderness.  Musculoskeletal: Normal range of motion. She exhibits no edema or tenderness.  Lymphadenopathy:    She has no cervical adenopathy.  Neurological: She is alert and oriented to person, place, and time. Coordination normal.  Skin: Skin is warm and dry. No rash noted. No erythema.  Psychiatric: She has a normal mood and affect. Her behavior is normal. Judgment and thought content normal.

## 2015-12-13 NOTE — Assessment & Plan Note (Signed)
Blood pressure is well controlled on today's visit. No changes made to the medications. 

## 2015-12-13 NOTE — Patient Instructions (Signed)
You are doing well. No medication changes were made.  Please call us if you have new issues that need to be addressed before your next appt.  Your physician wants you to follow-up in: 12 months.  You will receive a reminder letter in the mail two months in advance. If you don't receive a letter, please call our office to schedule the follow-up appointment. 

## 2015-12-13 NOTE — Assessment & Plan Note (Signed)
Cholesterol is at goal on the current lipid regimen. No changes to the medications were made.  

## 2015-12-13 NOTE — Assessment & Plan Note (Signed)
Does not want medications for her palpitations. Recommend she call us if symptoms get worse, likely having ectopy, unable to exclude short run of atrial tachycardia Reports it has been going on for 20 years

## 2015-12-22 ENCOUNTER — Ambulatory Visit (INDEPENDENT_AMBULATORY_CARE_PROVIDER_SITE_OTHER): Payer: Medicare Other | Admitting: Obstetrics and Gynecology

## 2015-12-22 VITALS — BP 133/72 | HR 73 | Ht 61.0 in | Wt 134.6 lb

## 2015-12-22 DIAGNOSIS — N898 Other specified noninflammatory disorders of vagina: Secondary | ICD-10-CM

## 2015-12-22 DIAGNOSIS — N362 Urethral caruncle: Secondary | ICD-10-CM

## 2015-12-22 DIAGNOSIS — N812 Incomplete uterovaginal prolapse: Secondary | ICD-10-CM

## 2015-12-22 DIAGNOSIS — Z9289 Personal history of other medical treatment: Secondary | ICD-10-CM | POA: Diagnosis not present

## 2015-12-22 DIAGNOSIS — Z960 Presence of urogenital implants: Secondary | ICD-10-CM

## 2015-12-22 DIAGNOSIS — N8110 Cystocele, unspecified: Secondary | ICD-10-CM | POA: Diagnosis not present

## 2015-12-22 DIAGNOSIS — IMO0002 Reserved for concepts with insufficient information to code with codable children: Secondary | ICD-10-CM

## 2015-12-22 DIAGNOSIS — IMO0001 Reserved for inherently not codable concepts without codable children: Secondary | ICD-10-CM

## 2015-12-22 NOTE — Progress Notes (Signed)
    GYNECOLOGY PROGRESS NOTE  Subjective:    Patient ID: Elizabeth [name], female    DOB: 08/18/1927, 81 y.o.   MRN: [id number]  HPI  Patient is a 81 y.o. 432P2002 female who presents for pessary check. She reports no vaginal bleeding but does note a discharge (yellowish with slight odor), unsure if urinary or vaginal. She denies pelvic discomfort and difficulty urinating.  Does note occasional episodes of mild constipation, relieved by increasing fluids and fiber.    The following portions of the patient's history were reviewed and updated as appropriate: allergies, current medications, past family history, past medical history, past social history, past surgical history and problem list.   Review of Systems Pertinent items are noted in HPI.   Objective:    Blood pressure 133/72, pulse 73, height 5\' 1"  (1.549 m), weight 134 lb 9.6 oz (61.054 kg). General appearance: alert and no distress Pelvis: The patient's  Size 2 3/4 Gelhorn pessary was removed, cleaned and replaced without complications. Speculum examination revealed normal vaginal mucosa with no lesions or lacerations.  Vagina with small amount of thin yellowish watery fluid.  Urethra with presence of caruncle vs prolapse.   Microscopic wet-mount exam shows negative for pathogens, normal epithelial cells.  Assessment:   Pessary maintenance Cystocele grade 3, Uterine prolapse Grade 1 Urethral caruncle vs prolapse Vaginal discharge  Plan:   Gelhorn size 2 3/4 removed, cleaned, and replaced without difficulty. Continue use of alternating Estrace and Trimosan gel as prescribed. Advised on also putting dime sized amount to urethral meatus.  Will continue to monitor prolapse vs caruncle of urethra.  Vaginal discharge likely physiologic and due to use of gels and creams. Wet mount negative, offered reassurance.  RTC in 10 weeks for next pessary check.   Hildred LaserAnika Enslie Sahota, MD [facility]

## 2015-12-23 DIAGNOSIS — N362 Urethral caruncle: Secondary | ICD-10-CM | POA: Insufficient documentation

## 2015-12-27 ENCOUNTER — Telehealth: Payer: Self-pay | Admitting: *Deleted

## 2015-12-27 ENCOUNTER — Telehealth: Payer: Self-pay | Admitting: Internal Medicine

## 2015-12-27 NOTE — Telephone Encounter (Signed)
Pt c/o BP issue: STAT if pt c/o blurred vision, one-sided weakness or slurred speech  1. What are your last 5 BP readings? 12/21/15 am 140/70, pm 170/72, 12/22/15 am 150/72, pm 174/78, 12/23/15 am 140/80, pm 180/84, 12/27/15 am 178/78  2. Are you having any other symptoms (ex. Dizziness, headache, blurred vision, passed out)? No  3. What is your BP issue? Patient feels light headed and Left arm feels tingly.  She is taking quinapril (ACCUPRIL) 40 MG tablet once a day.

## 2015-12-27 NOTE — Telephone Encounter (Signed)
Patient Name: Elizabeth BoardsCOLLEEN Delpilar  DOB: 07/31/1927    Initial Comment Caller states on a medication for BP; left arm on and off numbness; BP high; shaking rocky; 180/78   Nurse Assessment  Nurse: Sherilyn CooterHenry, RN, Thurmond ButtsWade Date/Time (Eastern Time): 12/27/2015 10:45:00 AM  Confirm and document reason for call. If symptomatic, describe symptoms. ---Caller states that she has had numbness that comes and goes in her left arm since 12/28. Her BP was 180/78 this morning. She takes Accupril for 15 years. Normally around 110/60. It has been higher than normal for the past 4 days. She has had some shakiness. Denies headache. Feels a little off balance. For the last 3 days, she has taken an extra Accupril around 3pm.  Has the patient traveled out of the country within the last 30 days? ---No  Does the patient have any new or worsening symptoms? ---Yes  Will a triage be completed? ---Yes  Related visit to physician within the last 2 weeks? ---No  Does the PT have any chronic conditions? (i.e. diabetes, asthma, etc.) ---Yes  List chronic conditions. ---HTN  Is this a behavioral health or substance abuse call? ---No     Guidelines    Guideline Title Affirmed Question Affirmed Notes  High Blood Pressure [1] Taking BP medications AND [2] feels is having side effects (e.g., impotence, cough, dizzy upon standing)    Final Disposition User   See Physician within 24 Hours Sherilyn CooterHenry, RN, Thurmond ButtsWade    Referrals  REFERRED TO PCP OFFICE   Disagree/Comply: Danella Maiersomply

## 2015-12-27 NOTE — Telephone Encounter (Signed)
Pt sched to see PCP tomorrow at 8:45.

## 2015-12-28 ENCOUNTER — Encounter: Payer: Self-pay | Admitting: Family Medicine

## 2015-12-28 ENCOUNTER — Ambulatory Visit (INDEPENDENT_AMBULATORY_CARE_PROVIDER_SITE_OTHER): Payer: Medicare Other | Admitting: Family Medicine

## 2015-12-28 ENCOUNTER — Other Ambulatory Visit: Payer: Self-pay

## 2015-12-28 ENCOUNTER — Telehealth: Payer: Self-pay | Admitting: Internal Medicine

## 2015-12-28 VITALS — BP 120/86 | HR 93 | Temp 98.3°F | Ht 61.0 in | Wt 133.5 lb

## 2015-12-28 DIAGNOSIS — I10 Essential (primary) hypertension: Secondary | ICD-10-CM

## 2015-12-28 DIAGNOSIS — R0602 Shortness of breath: Secondary | ICD-10-CM

## 2015-12-28 LAB — BASIC METABOLIC PANEL
BUN: 24 mg/dL — AB (ref 6–23)
CALCIUM: 9.5 mg/dL (ref 8.4–10.5)
CO2: 20 mEq/L (ref 19–32)
Chloride: 103 mEq/L (ref 96–112)
Creatinine, Ser: 1.08 mg/dL (ref 0.40–1.20)
GFR: 51.02 mL/min — ABNORMAL LOW (ref >60.00)
GLUCOSE: 99 mg/dL (ref 70–99)
POTASSIUM: 5.3 meq/L — AB (ref 3.5–5.1)
SODIUM: 134 meq/L — AB (ref 135–145)

## 2015-12-28 MED ORDER — AMLODIPINE BESYLATE 5 MG PO TABS: 5.0000 mg | ORAL_TABLET | Freq: Every day | ORAL | Status: DC

## 2015-12-28 NOTE — Patient Instructions (Signed)
Continue the accupril once daily.  We will call with your lab results.  I have added Norvasc.  Take your BP daily.  Follow up early next week.  Take care  Dr. Adriana Simasook

## 2015-12-28 NOTE — Assessment & Plan Note (Signed)
Established problem. Patient reporting recent shortness of breath. I reviewed prior records where she had seen cardiology. She has had thorough workup with EKG, echo, and stress test. Her exam is unremarkable today. History is not suggestive of ischemia. Patient is a with exercise without difficulty. Will continue to monitor closely. If continues will send back to cardiology for further evaluation.

## 2015-12-28 NOTE — Telephone Encounter (Signed)
Pt came in stating that her bp medication was not called in to the right pharmacy. amLODipine (NORVASC) 5 MG tablet. Pharmacy is [facility] [place], [place] - [address] Thank You!

## 2015-12-28 NOTE — Progress Notes (Signed)
Recent to the right pharmacy.

## 2015-12-28 NOTE — Assessment & Plan Note (Signed)
Uncontrolled. Patient with recent worsening of her blood pressure for unclear reasons. Check today revealed systolic in the 170s. She had improvement to the 150 after resting in the room for several minutes. Adding Norvasc today. Patient to follow-up next week for reevaluation.

## 2015-12-28 NOTE — Telephone Encounter (Signed)
Was sent by Dr. Patsey Bertholdook's CMA

## 2015-12-28 NOTE — Progress Notes (Signed)
Subjective:  Patient ID: Elizabeth [name], female    DOB: [birth year]  Age: 82 y.o. MRN: [id number]  CC: Elevated BP, SOB  HPI:  82 year old female with a past medical history of hypertension presents with the above complaint.  Patient states that for the past week she has noticed that her blood pressures been elevated. She states that she took her blood pressure after having an episode of shortness of breath. Blood pressure was found to be elevated. She states they've been ranging from the 150s to the 180s systolic. As a result her blood pressure elevation she doubled her quinapril to 80 mg. She reports some associated dizziness but attributes this to allergies. No known inciting or exacerbating factors. No relieving factors.  In regards her shortness of breath, she states that she's had a few episodes of the past week. She has had this in the past and has had a negative workup. She describes it as feeling as if she can't get a deep breath. She is very active and exercises without difficulty. No reports of chest pain. She states that the shortness of breath resolves spontaneously without intervention. No recent fevers or chills.  Social Hx   Social History   Social History  . Marital Status: Married    Spouse Name: N/A  . Number of Children: 2  . Years of Education: N/A   Social History Main Topics  . Smoking status: Never Smoker   . Smokeless tobacco: Never Used  . Alcohol Use: No  . Drug Use: No  . Sexual Activity: Not Currently    Birth Control/ Protection: None   Other Topics Concern  . None   Social History Narrative   Review of Systems  Constitutional: Negative.   Respiratory: Positive for shortness of breath.   Cardiovascular: Negative for chest pain.   Objective:  BP 120/86 mmHg  Pulse 93  Temp(Src) 98.3 F (36.8 C) (Oral)  Ht 5\' 1"  (1.549 m)  Wt 133 lb 8 oz (60.555 kg)  BMI 25.24 kg/m2  SpO2 96%  BP/Weight 12/28/2015 12/22/2015 12/13/2015  Systolic BP 120 133  120  Diastolic BP 86 72 64  Wt. (Lbs) 133.5 134.6 134.5  BMI 25.24 25.45 25.01   Physical Exam  Constitutional: She is oriented to person, place, and time. She appears well-developed. No distress.  Cardiovascular: Regular rhythm.  Tachycardia present.   Pulmonary/Chest: Effort normal and breath sounds normal. No respiratory distress. She has no wheezes. She has no rales.  Neurological: She is alert and oriented to person, place, and time.  Psychiatric: She has a normal mood and affect.  Vitals reviewed.  Lab Results  Component Value Date   WBC 8.6 04/29/2015   HGB 13.6 04/29/2015   HCT 40.9 04/29/2015   PLT 270.0 04/29/2015   GLUCOSE 91 08/08/2015   CHOL 164 08/08/2015   TRIG 100.0 08/08/2015   HDL 47.70 08/08/2015   LDLDIRECT 178.4 11/04/2013   LDLCALC 96 08/08/2015   ALT 17 08/08/2015   AST 18 08/08/2015   NA 137 08/08/2015   K 4.5 08/08/2015   CL 103 08/08/2015   CREATININE 0.95 08/08/2015   BUN 20 08/08/2015   CO2 24 08/08/2015   TSH 1.93 04/29/2015    Assessment & Plan:   Problem List Items Addressed This Visit    SOB (shortness of breath)    Established problem. Patient reporting recent shortness of breath. I reviewed prior records where she had seen cardiology. She has had thorough workup with EKG,  echo, and stress test. Her exam is unremarkable today. History is not suggestive of ischemia. Patient is a with exercise without difficulty. Will continue to monitor closely. If continues will send back to cardiology for further evaluation.      Hypertension - Primary    Uncontrolled. Patient with recent worsening of her blood pressure for unclear reasons. Check today revealed systolic in the 170s. She had improvement to the 150 after resting in the room for several minutes. Adding Norvasc today. Patient to follow-up next week for reevaluation.      Relevant Medications   amLODipine (NORVASC) 5 MG tablet   Other Relevant Orders   Basic metabolic panel       Meds ordered this encounter  Medications  . amLODipine (NORVASC) 5 MG tablet    Sig: Take 1 tablet (5 mg total) by mouth daily.    Dispense:  30 tablet    Refill:  1    Follow-up: 1 week.  Everlene OtherJayce Kahlie Deutscher DO [facility] [place]

## 2015-12-28 NOTE — Progress Notes (Signed)
Pre visit review using our clinic review tool, if applicable. No additional management support is needed unless otherwise documented below in the visit note. 

## 2015-12-30 ENCOUNTER — Telehealth: Payer: Self-pay | Admitting: Internal Medicine

## 2015-12-30 NOTE — Telephone Encounter (Signed)
Left Patient a VM to return my call. Thanks

## 2015-12-30 NOTE — Telephone Encounter (Signed)
Pt called back about labs.. Please call back

## 2015-12-30 NOTE — Telephone Encounter (Signed)
Pt came in wanting to know her lab results and if she needed to make a follow up with a provider or a nurse visit.Elizabeth [name]. Please advise pt at [phone].

## 2015-12-30 NOTE — Telephone Encounter (Signed)
Spoke with the patient, reviewed labs with her.  Verbalized understanding.

## 2016-01-11 ENCOUNTER — Other Ambulatory Visit: Payer: Self-pay | Admitting: Internal Medicine

## 2016-01-19 ENCOUNTER — Ambulatory Visit: Payer: Medicare Other | Admitting: Internal Medicine

## 2016-01-23 ENCOUNTER — Ambulatory Visit (INDEPENDENT_AMBULATORY_CARE_PROVIDER_SITE_OTHER): Payer: Medicare Other | Admitting: Internal Medicine

## 2016-01-23 ENCOUNTER — Encounter: Payer: Self-pay | Admitting: Internal Medicine

## 2016-01-23 VITALS — BP 136/78 | HR 96 | Temp 98.1°F | Ht 61.0 in | Wt 133.8 lb

## 2016-01-23 DIAGNOSIS — F439 Reaction to severe stress, unspecified: Secondary | ICD-10-CM

## 2016-01-23 DIAGNOSIS — I10 Essential (primary) hypertension: Secondary | ICD-10-CM

## 2016-01-23 DIAGNOSIS — E7800 Pure hypercholesterolemia, unspecified: Secondary | ICD-10-CM

## 2016-01-23 DIAGNOSIS — E875 Hyperkalemia: Secondary | ICD-10-CM

## 2016-01-23 DIAGNOSIS — R51 Headache: Secondary | ICD-10-CM

## 2016-01-23 DIAGNOSIS — I499 Cardiac arrhythmia, unspecified: Secondary | ICD-10-CM | POA: Diagnosis not present

## 2016-01-23 DIAGNOSIS — R42 Dizziness and giddiness: Secondary | ICD-10-CM

## 2016-01-23 DIAGNOSIS — R208 Other disturbances of skin sensation: Secondary | ICD-10-CM | POA: Diagnosis not present

## 2016-01-23 DIAGNOSIS — Z658 Other specified problems related to psychosocial circumstances: Secondary | ICD-10-CM

## 2016-01-23 DIAGNOSIS — R519 Headache, unspecified: Secondary | ICD-10-CM

## 2016-01-23 DIAGNOSIS — R200 Anesthesia of skin: Secondary | ICD-10-CM

## 2016-01-23 NOTE — Progress Notes (Signed)
Pre visit review using our clinic review tool, if applicable. No additional management support is needed unless otherwise documented below in the visit note. 

## 2016-01-23 NOTE — Progress Notes (Signed)
Patient ID: Elizabeth [name], female   DOB: Mar 11, 1927, 82 y.o.   MRN: [id number]   Subjective:    Patient ID: Elizabeth [name], female    DOB: 11/24/1927, 82 y.o.   MRN: [id number]  HPI  Patient with past history of hypercholesterolemia, IBS and hypertension.  She comes in today to follow up on these issues.  Saw cardiology on 12/12/16.  Was doing well.  States after this, she had a weekend where her blood pressure started elevating.  Felt a little light headed.  Had some palpitations.  Took extra dose of accupril.  Was evaluated by Dr Adriana Simas.  See his note for details.  Amlodipine added.  She only took for a few days.  The acute episode resolved after a few days.  She continues not to feel well.  Still some occasional palpitations.  Still exercises.  Has no problems when she exercises.  Does report noticing some intermittent chest tightness.  This occurs when sitting.  Blood pressure still varying.  She also has noticed some intermittent left arm numbness.  No known trigger.  Some neck stiffness.  Having more headaches than previous.  Mostly left sided headaches.  Was off her aspirin some recently.  Back on now.  Brings in BP readings from cardiac rehab.  No elevations.      Past Medical History  Diagnosis Date  . Irritable bowel syndrome   . Hyperlipidemia   . Hypertension   . Cystocele or rectocele with incomplete uterine prolapse   . Vaginal atrophy    Past Surgical History  Procedure Laterality Date  . Cataract extraction  2006   Family History  Problem Relation Age of Onset  . Stroke Father   . Transient ischemic attack Mother     multiple   . Heart disease Brother     MI - died age 31  . Leukemia Brother   . Lung cancer Brother   . Throat cancer Brother     had heart disease also  . Heart disease Sister    Social History   Social History  . Marital Status: Married    Spouse Name: N/A  . Number of Children: 2  . Years of Education: N/A   Social History Main Topics  . Smoking  status: Never Smoker   . Smokeless tobacco: Never Used  . Alcohol Use: No  . Drug Use: No  . Sexual Activity: Not Currently    Birth Control/ Protection: None   Other Topics Concern  . None   Social History Narrative    Outpatient Encounter Prescriptions as of 01/23/2016  Medication Sig  . ACCUPRIL 40 MG tablet TAKE 1 TABLET BY MOUTH ONCE DAILY.  Marland Kitchen amLODipine (NORVASC) 5 MG tablet Take 1 tablet (5 mg total) by mouth daily.  Marland Kitchen aspirin 81 MG tablet Take 81 mg by mouth daily.  Marland Kitchen atorvastatin (LIPITOR) 40 MG tablet Take 1 tablet (40 mg total) by mouth daily. Name brand only.  . cetirizine (ZYRTEC) 10 MG tablet Take 10 mg by mouth daily as needed.   . Cholecalciferol (VITAMIN D-3) 1000 UNITS CAPS Take by mouth.  . triamcinolone (NASACORT) 55 MCG/ACT nasal inhaler Place 2 sprays into the nose daily as needed.    No facility-administered encounter medications on file as of 01/23/2016.    Review of Systems  Constitutional: Negative for appetite change and unexpected weight change.  HENT: Negative for sore throat.        No increased sinus pressure.  Stable  congestion.    Eyes: Negative for pain and visual disturbance.  Respiratory: Positive for chest tightness. Negative for cough and shortness of breath.   Cardiovascular: Positive for palpitations. Negative for leg swelling.  Gastrointestinal: Negative for nausea, vomiting, abdominal pain and diarrhea.  Genitourinary: Negative for dysuria and difficulty urinating.  Musculoskeletal: Negative for back pain.       Some neck pain and stiffness.    Skin: Negative for color change and rash.  Neurological: Positive for light-headedness and headaches.  Psychiatric/Behavioral: Negative for dysphoric mood and agitation.       Objective:     Blood pressure checked by me:  136/78  Physical Exam  Constitutional: She appears well-developed and well-nourished. No distress.  HENT:  Nose: Nose normal.  Mouth/Throat: Oropharynx is clear and  moist.  Eyes: Conjunctivae are normal. Right eye exhibits no discharge. Left eye exhibits no discharge.  Neck: Neck supple. No thyromegaly present.  Cardiovascular: Normal rate and regular rhythm.   Pulmonary/Chest: Breath sounds normal. No respiratory distress. She has no wheezes.  Abdominal: Soft. Bowel sounds are normal. There is no tenderness.  Musculoskeletal: She exhibits no edema or tenderness.  Lymphadenopathy:    She has no cervical adenopathy.  Skin: No rash noted. No erythema.  Psychiatric: She has a normal mood and affect. Her behavior is normal.    BP 136/78 mmHg  Pulse 96  Temp(Src) 98.1 F (36.7 C) (Oral)  Ht  (1.549 m)  Wt 133 lb 12 oz (60.669 kg)  BMI 25.29 kg/m2  SpO2 98% Wt Readings from Last 3 Encounters:  01/23/16 133 lb 12 oz (60.669 kg)  12/28/15 133 lb 8 oz (60.555 kg)  12/22/15 134 lb 9.6 oz (61.054 kg)     Lab Results  Component Value Date   WBC 8.6 04/29/2015   HGB 13.6 04/29/2015   HCT 40.9 04/29/2015   PLT 270.0 04/29/2015   GLUCOSE 99 12/28/2015   CHOL 164 08/08/2015   TRIG 100.0 08/08/2015   HDL 47.70 08/08/2015   LDLDIRECT 178.4 11/04/2013   LDLCALC 96 08/08/2015   ALT 17 08/08/2015   AST 18 08/08/2015   NA 134* 12/28/2015   K 5.3* 12/28/2015   CL 103 12/28/2015   CREATININE 1.08 12/28/2015   BUN 24* 12/28/2015   CO2 20 12/28/2015   TSH 1.93 04/29/2015       Assessment & Plan:   Problem List Items Addressed This Visit    Arrhythmia    Has had intermittent palpitations.  Has previously been stable.  Some persistent palpitations with associated symptoms as outlined.  She wanted to hold on repeat EKG today.  Discussed further w/up.  Refer back to cardiology for evaluation.        Relevant Orders   Ambulatory referral to Cardiology   Headache    Increased headaches.  More left sided.  Light headedness associated.  Will check MRI (brain).   Given the episode that lasted a few days and then resolved, will also check carotid  ultrasound.  She was off aspirin.  She is back on aspirin.  Needs to take daily.        Relevant Orders   MR Brain W Wo Contrast   Carotid   Hypercholesterolemia    On lipitor.  Low cholesterol diet and exercise.        Hyperkalemia    Slightly elevation recently.   Will recheck potassium level.        Hypertension    Blood pressure  elevated intermittently.  Appears to be low at cardiac rehab. Recheck today as outlined.  Hold on additional medication.  Follow pressures.  Check metabolic panel.        Relevant Orders   Ambulatory referral to Cardiology   Left arm numbness - Primary    This has occurred.  Question if positional or msk in origin.  Not an issue currently.  W/up with MRI and carotid ultrasound as outlined.        Relevant Orders   MR Brain W Wo Contrast   Carotid   Stress    Is under increased stress.  Feels she is handling things relatively well.  Does not feel needs any further intervention.  Will follow.         Other Visit Diagnoses    Light headedness        Relevant Orders    MR Brain W Wo Contrast    Carotid        Dale Joice, MD

## 2016-01-24 ENCOUNTER — Encounter: Payer: Self-pay | Admitting: Internal Medicine

## 2016-01-24 NOTE — Assessment & Plan Note (Signed)
Slightly elevation recently.   Will recheck potassium level.

## 2016-01-24 NOTE — Assessment & Plan Note (Signed)
Blood pressure elevated intermittently.  Appears to be low at cardiac rehab. Recheck today as outlined.  Hold on additional medication.  Follow pressures.  Check metabolic panel.

## 2016-01-24 NOTE — Assessment & Plan Note (Signed)
On lipitor.  Low cholesterol diet and exercise.   

## 2016-01-24 NOTE — Assessment & Plan Note (Signed)
Has had intermittent palpitations.  Has previously been stable.  Some persistent palpitations with associated symptoms as outlined.  She wanted to hold on repeat EKG today.  Discussed further w/up.  Refer back to cardiology for evaluation.

## 2016-01-26 ENCOUNTER — Telehealth: Payer: Self-pay | Admitting: Internal Medicine

## 2016-01-26 ENCOUNTER — Telehealth: Payer: Self-pay | Admitting: *Deleted

## 2016-01-26 ENCOUNTER — Other Ambulatory Visit (INDEPENDENT_AMBULATORY_CARE_PROVIDER_SITE_OTHER): Payer: Medicare Other

## 2016-01-26 DIAGNOSIS — R519 Headache, unspecified: Secondary | ICD-10-CM | POA: Insufficient documentation

## 2016-01-26 DIAGNOSIS — I10 Essential (primary) hypertension: Secondary | ICD-10-CM

## 2016-01-26 DIAGNOSIS — R200 Anesthesia of skin: Secondary | ICD-10-CM | POA: Insufficient documentation

## 2016-01-26 DIAGNOSIS — E7800 Pure hypercholesterolemia, unspecified: Secondary | ICD-10-CM

## 2016-01-26 DIAGNOSIS — R51 Headache: Secondary | ICD-10-CM

## 2016-01-26 NOTE — Assessment & Plan Note (Signed)
This has occurred.  Question if positional or msk in origin.  Not an issue currently.  W/up with MRI and carotid ultrasound as outlined.

## 2016-01-26 NOTE — Telephone Encounter (Signed)
Order placed for labs.

## 2016-01-26 NOTE — Telephone Encounter (Signed)
Spoke to pt.  Dr Windell Hummingbird office called her about appt tomorrow at 2:20.  She is going.  Other tests are scheduled.

## 2016-01-26 NOTE — Telephone Encounter (Signed)
Labs and dx?  

## 2016-01-26 NOTE — Assessment & Plan Note (Signed)
Increased headaches.  More left sided.  Light headedness associated.  Will check MRI (brain).   Given the episode that lasted a few days and then resolved, will also check carotid ultrasound.  She was off aspirin.  She is back on aspirin.  Needs to take daily.

## 2016-01-26 NOTE — Assessment & Plan Note (Signed)
Is under increased stress.  Feels she is handling things relatively well.  Does not feel needs any further intervention.  Will follow.

## 2016-01-26 NOTE — Telephone Encounter (Signed)
Pt came in stating that she was told that someone would be in contact with her about an ultrasound and an MRI.. But there are no orders/referrals in the system.. Please advise

## 2016-01-27 ENCOUNTER — Ambulatory Visit (INDEPENDENT_AMBULATORY_CARE_PROVIDER_SITE_OTHER): Payer: Medicare Other | Admitting: Cardiovascular Disease

## 2016-01-27 ENCOUNTER — Encounter: Payer: Self-pay | Admitting: Cardiovascular Disease

## 2016-01-27 VITALS — BP 130/86 | HR 87 | Ht 61.5 in | Wt 135.0 lb

## 2016-01-27 DIAGNOSIS — R000 Tachycardia, unspecified: Secondary | ICD-10-CM | POA: Diagnosis not present

## 2016-01-27 DIAGNOSIS — I10 Essential (primary) hypertension: Secondary | ICD-10-CM | POA: Diagnosis not present

## 2016-01-27 DIAGNOSIS — E7800 Pure hypercholesterolemia, unspecified: Secondary | ICD-10-CM

## 2016-01-27 DIAGNOSIS — I471 Supraventricular tachycardia: Secondary | ICD-10-CM | POA: Insufficient documentation

## 2016-01-27 DIAGNOSIS — Z658 Other specified problems related to psychosocial circumstances: Secondary | ICD-10-CM | POA: Diagnosis not present

## 2016-01-27 DIAGNOSIS — I499 Cardiac arrhythmia, unspecified: Secondary | ICD-10-CM

## 2016-01-27 DIAGNOSIS — F439 Reaction to severe stress, unspecified: Secondary | ICD-10-CM

## 2016-01-27 LAB — HEPATIC FUNCTION PANEL
ALBUMIN: 4.1 g/dL (ref 3.5–5.2)
ALK PHOS: 68 U/L (ref 39–117)
ALT: 13 U/L (ref 0–35)
AST: 17 U/L (ref 0–37)
Bilirubin, Direct: 0.1 mg/dL (ref 0.0–0.3)
TOTAL PROTEIN: 6.4 g/dL (ref 6.0–8.3)
Total Bilirubin: 0.4 mg/dL (ref 0.2–1.2)

## 2016-01-27 LAB — LIPID PANEL
CHOL/HDL RATIO: 4
Cholesterol: 208 mg/dL — ABNORMAL HIGH (ref 0–200)
HDL: 49.8 mg/dL (ref >39.00)
LDL CALC: 130 mg/dL — AB (ref 0–99)
NonHDL: 158.53
TRIGLYCERIDES: 145 mg/dL (ref 0.0–149.0)
VLDL: 29 mg/dL (ref 0.0–40.0)

## 2016-01-27 LAB — CBC WITH DIFFERENTIAL/PLATELET
BASOS ABS: 0 10*3/uL (ref 0.0–0.1)
Basophils Relative: 0.2 % (ref 0.0–3.0)
Eosinophils Absolute: 0.2 10*3/uL (ref 0.0–0.7)
Eosinophils Relative: 3.5 % (ref 0.0–5.0)
HEMATOCRIT: 43.2 % (ref 36.0–46.0)
HEMOGLOBIN: 14 g/dL (ref 12.0–15.0)
LYMPHS PCT: 33.7 % (ref 12.0–46.0)
Lymphs Abs: 2.3 10*3/uL (ref 0.7–4.0)
MCHC: 32.5 g/dL (ref 30.0–36.0)
MCV: 92.3 fl (ref 78.0–100.0)
MONOS PCT: 6.7 % (ref 3.0–12.0)
Monocytes Absolute: 0.5 10*3/uL (ref 0.1–1.0)
NEUTROS PCT: 55.9 % (ref 43.0–77.0)
Neutro Abs: 3.9 10*3/uL (ref 1.4–7.7)
Platelets: 254 10*3/uL (ref 150.0–400.0)
RBC: 4.68 Mil/uL (ref 3.87–5.11)
RDW: 14.6 % (ref 11.5–15.5)
WBC: 6.9 10*3/uL (ref 4.0–10.5)

## 2016-01-27 LAB — BASIC METABOLIC PANEL
BUN: 22 mg/dL (ref 6–23)
CHLORIDE: 105 meq/L (ref 96–112)
CO2: 24 mEq/L (ref 19–32)
CREATININE: 1.08 mg/dL (ref 0.40–1.20)
Calcium: 9.4 mg/dL (ref 8.4–10.5)
GFR: 51.01 mL/min — ABNORMAL LOW (ref >60.00)
Glucose, Bld: 95 mg/dL (ref 70–99)
Potassium: 5.2 mEq/L — ABNORMAL HIGH (ref 3.5–5.1)
Sodium: 138 mEq/L (ref 135–145)

## 2016-01-27 LAB — TSH: TSH: 3.62 u[IU]/mL (ref 0.35–4.50)

## 2016-01-27 MED ORDER — PROPRANOLOL HCL 10 MG PO TABS: 10.0000 mg | ORAL_TABLET | Freq: Three times a day (TID) | ORAL | Status: DC | PRN

## 2016-01-27 MED ORDER — HYDRALAZINE HCL 25 MG PO TABS: 25.0000 mg | ORAL_TABLET | Freq: Three times a day (TID) | ORAL | Status: DC | PRN

## 2016-01-27 NOTE — Assessment & Plan Note (Signed)
Etiology of her tachycardia unclear, unable to exclude atrial tachycardia or other arrhythmia. We have offered a monitor. She has declined at this time.  We have given her propranolol to take as needed 10 up to 20 mgrams when necessary for tachycardia

## 2016-01-27 NOTE — Patient Instructions (Signed)
You are doing well.  For high blood pressure, Take hydralazine 1 to 2 pills as needed  For tachycardia, Take propranolol 1 to 2 pills as needed  Please call us if you have new issues that need to be addressed before your next appt.  Your physician wants you to follow-up in: 6 months.  You will receive a reminder letter in the mail two months in advance. If you don't receive a letter, please call our office to schedule the follow-up appointment.

## 2016-01-27 NOTE — Assessment & Plan Note (Signed)
Blood pressure well-controlled over the past several weeks since the new years.  Etiology of the spike in her blood pressure unclear, unable to exclude stress.  MRI is scheduled to rule out TIA or stroke.   Currently feels well with no neurologic issues  Carotid ultrasound also scheduled  Recommended she continue her current medications, if blood pressure is elevated again in the future, suggested she take hydralazine 25 mg up to 50 mg as needed every 4 to 6 hours  And call our office

## 2016-01-27 NOTE — Assessment & Plan Note (Signed)
Encouraged her to continue her Lipitor   Total encounter time more than 25 minutes  Greater than 50% was spent in counseling and coordination of care with the patient

## 2016-01-27 NOTE — Progress Notes (Signed)
Patient ID: Elizabeth [name], female    DOB: 03-Apr-1927, 82 y.o.   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  Could state elevated all weekend.  Prior to and following that, blood pressure was stable, typically 120-130 systolic.  She was given amlodipine but she did not take this as her blood pressures did improve.   Currently feels well. No chest pain, no shortness of breath on exertion    She does report having episode of severe tachycardia , persisted longer than other episodes ,  Seem to go away on its own without intervention.  She does have significant stress at home, taking care of her husband. She is the primary caretaker   EKG on today's visit shows normal sinus rhythm with rate 86 bpm, no significant ST or T-wave changes,  Poor R-wave progression to the anterior precordial leads   other past medical history  long history of short runs of palpitations, she has had these for 20 years  previously was using the treadmill 4-5 times per week She tried a lower dose of benazepril, reported her systolic pressure was 170 in the evenings on 20 mg daily, went back to 40 mg daily   cholesterol discussed with her, total cholesterol 164, LDL 96  previously seen by Dr. Jenne Campus with stress testing, echocardiogram, Holter monitor at that time. She reports that the Holter monitor was relatively unrevealing. She was told in the past that she had a heart murmur, echocardiogram showing valve calcification. Follow-up echocardiogram 2 by her report did not confirm any valve disease. She passed the stress test with "flying colors"   No  Known Allergies  Current Outpatient Prescriptions on File Prior to Visit  Medication Sig Dispense Refill  . ACCUPRIL 40 MG tablet TAKE 1 TABLET BY MOUTH ONCE DAILY. 30 tablet 1  . aspirin 81 MG tablet Take 81 mg by mouth daily.    Marland Kitchen atorvastatin (LIPITOR) 40 MG tablet Take 1 tablet (40 mg total) by mouth daily. Name brand only. 30 tablet 5  . cetirizine (ZYRTEC) 10 MG tablet Take 10 mg by mouth daily as needed.     . Cholecalciferol (VITAMIN D-3) 1000 UNITS CAPS Take by mouth.    . triamcinolone (NASACORT) 55 MCG/ACT nasal inhaler Place 2 sprays into the nose daily as needed.      No current facility-administered medications on file prior to visit.    Past Medical History  Diagnosis Date  . Irritable bowel syndrome   . Hyperlipidemia   . Hypertension   . Cystocele or rectocele with incomplete uterine prolapse   . Vaginal atrophy     Past Surgical History  Procedure Laterality Date  . Cataract extraction  2006    Social History  reports that she has never smoked. She has never used smokeless tobacco. She reports that she does not drink alcohol or use illicit drugs.  Family History family history includes Heart disease in her brother and sister; Leukemia in her brother; Lung cancer in her brother; Stroke in her father; Throat cancer in her brother; Transient ischemic attack in her mother.   Review of Systems  Constitutional: Negative.   Respiratory:  Negative.   Cardiovascular: Positive for palpitations.  Gastrointestinal: Negative.   Musculoskeletal: Negative.   Neurological: Negative.   Hematological: Negative.   Psychiatric/Behavioral: The patient is nervous/anxious.   All other systems reviewed and are negative.   BP 130/86 mmHg  Pulse 87  Ht 5' 1.5" (1.562 m)  Wt 135 lb (61.236 kg)  BMI 25.10 kg/m2  Physical Exam  Constitutional: She is oriented to person, place, and time. She appears well-developed and well-nourished.  HENT:  Head: Normocephalic.  Nose:  Nose normal.  Mouth/Throat: Oropharynx is clear and moist.  Eyes: Conjunctivae are normal. Pupils are equal, round, and reactive to light.  Neck: Normal range of motion. Neck supple. No JVD present.  Cardiovascular: Normal rate, regular rhythm, normal heart sounds and intact distal pulses.  Exam reveals no gallop and no friction rub.   No murmur heard. Pulmonary/Chest: Effort normal and breath sounds normal. No respiratory distress. She has no wheezes. She has no rales. She exhibits no tenderness.  Abdominal: Soft. Bowel sounds are normal. She exhibits no distension. There is no tenderness.  Musculoskeletal: Normal range of motion. She exhibits no edema or tenderness.  Lymphadenopathy:    She has no cervical adenopathy.  Neurological: She is alert and oriented to person, place, and time. Coordination normal.  Skin: Skin is warm and dry. No rash noted. No erythema.  Psychiatric: She has a normal mood and affect. Her behavior is normal. Judgment and thought content normal.

## 2016-01-27 NOTE — Assessment & Plan Note (Signed)
Significant stress at home, takes care of her husband who has medical issues  Little respite, perhaps 1 day per week

## 2016-01-29 ENCOUNTER — Other Ambulatory Visit: Payer: Self-pay | Admitting: Internal Medicine

## 2016-01-29 DIAGNOSIS — E875 Hyperkalemia: Secondary | ICD-10-CM

## 2016-01-29 NOTE — Progress Notes (Signed)
Order placed for f/u potassium.  

## 2016-01-30 ENCOUNTER — Other Ambulatory Visit: Payer: Self-pay | Admitting: Internal Medicine

## 2016-01-30 DIAGNOSIS — R519 Headache, unspecified: Secondary | ICD-10-CM

## 2016-01-30 DIAGNOSIS — R51 Headache: Principal | ICD-10-CM

## 2016-01-30 DIAGNOSIS — R200 Anesthesia of skin: Secondary | ICD-10-CM

## 2016-01-30 NOTE — Progress Notes (Signed)
Order placed for MRI without contrast

## 2016-02-02 ENCOUNTER — Ambulatory Visit: Payer: Medicare Other

## 2016-02-02 ENCOUNTER — Ambulatory Visit
Admission: RE | Admit: 2016-02-02 | Discharge: 2016-02-02 | Disposition: A | Discharge status: Home / Self Care | Payer: Medicare Other | Source: Ambulatory Visit | Attending: Internal Medicine | Admitting: Internal Medicine

## 2016-02-02 DIAGNOSIS — R208 Other disturbances of skin sensation: Secondary | ICD-10-CM | POA: Insufficient documentation

## 2016-02-02 DIAGNOSIS — R200 Anesthesia of skin: Secondary | ICD-10-CM

## 2016-02-02 DIAGNOSIS — R51 Headache: Secondary | ICD-10-CM | POA: Diagnosis present

## 2016-02-02 DIAGNOSIS — R519 Headache, unspecified: Secondary | ICD-10-CM

## 2016-02-06 ENCOUNTER — Ambulatory Visit: Payer: Medicare Other

## 2016-02-06 DIAGNOSIS — R200 Anesthesia of skin: Secondary | ICD-10-CM

## 2016-02-06 DIAGNOSIS — R208 Other disturbances of skin sensation: Secondary | ICD-10-CM | POA: Diagnosis not present

## 2016-02-06 DIAGNOSIS — R51 Headache: Principal | ICD-10-CM

## 2016-02-06 DIAGNOSIS — R42 Dizziness and giddiness: Secondary | ICD-10-CM

## 2016-02-06 DIAGNOSIS — R519 Headache, unspecified: Secondary | ICD-10-CM

## 2016-02-08 ENCOUNTER — Encounter: Payer: Self-pay | Admitting: Internal Medicine

## 2016-02-08 ENCOUNTER — Other Ambulatory Visit (INDEPENDENT_AMBULATORY_CARE_PROVIDER_SITE_OTHER): Payer: Medicare Other

## 2016-02-08 DIAGNOSIS — E875 Hyperkalemia: Secondary | ICD-10-CM

## 2016-02-08 LAB — POTASSIUM: POTASSIUM: 5.4 meq/L — AB (ref 3.5–5.1)

## 2016-02-08 NOTE — Progress Notes (Signed)
Patient here today for labs only. °

## 2016-02-09 ENCOUNTER — Other Ambulatory Visit: Payer: Self-pay | Admitting: Internal Medicine

## 2016-02-09 DIAGNOSIS — E875 Hyperkalemia: Secondary | ICD-10-CM

## 2016-02-09 NOTE — Progress Notes (Signed)
Order placed for f/u potassium.  

## 2016-02-13 ENCOUNTER — Other Ambulatory Visit
Admission: RE | Admit: 2016-02-13 | Discharge: 2016-02-13 | Disposition: A | Discharge status: Home / Self Care | Payer: Medicare Other | Source: Ambulatory Visit | Attending: Internal Medicine | Admitting: Internal Medicine

## 2016-02-13 ENCOUNTER — Encounter: Payer: Self-pay | Admitting: Internal Medicine

## 2016-02-13 DIAGNOSIS — E875 Hyperkalemia: Secondary | ICD-10-CM | POA: Insufficient documentation

## 2016-02-13 LAB — POTASSIUM: POTASSIUM: 4.7 mmol/L (ref 3.5–5.1)

## 2016-03-01 ENCOUNTER — Encounter: Payer: Self-pay | Admitting: Obstetrics and Gynecology

## 2016-03-01 ENCOUNTER — Ambulatory Visit (INDEPENDENT_AMBULATORY_CARE_PROVIDER_SITE_OTHER): Payer: Medicare Other | Admitting: Obstetrics and Gynecology

## 2016-03-01 VITALS — BP 118/69 | HR 85 | Ht 61.0 in | Wt 135.9 lb

## 2016-03-01 DIAGNOSIS — N8110 Cystocele, unspecified: Secondary | ICD-10-CM | POA: Diagnosis not present

## 2016-03-01 DIAGNOSIS — Z960 Presence of urogenital implants: Secondary | ICD-10-CM

## 2016-03-01 DIAGNOSIS — Z9289 Personal history of other medical treatment: Secondary | ICD-10-CM

## 2016-03-01 DIAGNOSIS — N812 Incomplete uterovaginal prolapse: Secondary | ICD-10-CM

## 2016-03-01 DIAGNOSIS — IMO0001 Reserved for inherently not codable concepts without codable children: Secondary | ICD-10-CM

## 2016-03-01 DIAGNOSIS — IMO0002 Reserved for concepts with insufficient information to code with codable children: Principal | ICD-10-CM

## 2016-03-01 NOTE — Progress Notes (Signed)
    GYNECOLOGY PROGRESS NOTE  Subjective:    Patient ID: Elizabeth [name], female    DOB: 03/07/1927, 82 y.o.   MRN: [id number]  HPI  Patient is a 82 y.o. 832P2002 female who presents for pessary check. She reports no vaginal bleeding. She denies pelvic discomfort and difficulty urinating.  Desires different type of pessary, concerned about stem and worried about dislodging as tip can be felt when wiping.   The following portions of the patient's history were reviewed and updated as appropriate: allergies, current medications, past family history, past medical history, past social history, past surgical history and problem list.   Review of Systems Pertinent items are noted in HPI.   Objective:    Blood pressure 118/69, pulse 85, height 5\' 1"  (1.549 m), weight 135 lb 14.4 oz (61.644 kg). General appearance: alert and no distress Pelvis: The patient's  Size 2 3/4 Gelhorn pessary was removed, cleaned and replaced without complications. Speculum examination revealed normal vaginal mucosa with no lesions or lacerations.  Vagina with small amount of thin yellowish watery fluid.  Urethra with presence of caruncle.    Assessment:   Pessary maintenance Cystocele grade 3, Uterine prolapse Grade 1  Plan:   Gelhorn size 2 3/4 removed, cleaned, and replaced without difficulty. Continue use of alternating Estrace and Trimosan gel as prescribed. .  RTC in 10 weeks for next pessary check.   Will order same size pessary with shortened stem.  Will place a patient's next pessary check.    Hildred LaserAnika Naida Escalante, MD [facility]

## 2016-03-04 ENCOUNTER — Encounter: Payer: Self-pay | Admitting: Obstetrics and Gynecology

## 2016-03-10 ENCOUNTER — Other Ambulatory Visit: Payer: Self-pay | Admitting: Internal Medicine

## 2016-04-03 ENCOUNTER — Encounter: Payer: Self-pay | Admitting: Internal Medicine

## 2016-04-03 ENCOUNTER — Ambulatory Visit (INDEPENDENT_AMBULATORY_CARE_PROVIDER_SITE_OTHER): Payer: Medicare Other | Admitting: Internal Medicine

## 2016-04-03 VITALS — BP 120/70 | HR 76 | Temp 98.0°F | Resp 18 | Ht 61.0 in | Wt 133.5 lb

## 2016-04-03 DIAGNOSIS — E875 Hyperkalemia: Secondary | ICD-10-CM

## 2016-04-03 DIAGNOSIS — F439 Reaction to severe stress, unspecified: Secondary | ICD-10-CM

## 2016-04-03 DIAGNOSIS — E7800 Pure hypercholesterolemia, unspecified: Secondary | ICD-10-CM | POA: Diagnosis not present

## 2016-04-03 DIAGNOSIS — Z91048 Other nonmedicinal substance allergy status: Secondary | ICD-10-CM

## 2016-04-03 DIAGNOSIS — IMO0002 Reserved for concepts with insufficient information to code with codable children: Secondary | ICD-10-CM

## 2016-04-03 DIAGNOSIS — Z658 Other specified problems related to psychosocial circumstances: Secondary | ICD-10-CM | POA: Diagnosis not present

## 2016-04-03 DIAGNOSIS — I10 Essential (primary) hypertension: Secondary | ICD-10-CM | POA: Diagnosis not present

## 2016-04-03 DIAGNOSIS — N8110 Cystocele, unspecified: Secondary | ICD-10-CM

## 2016-04-03 DIAGNOSIS — Z9109 Other allergy status, other than to drugs and biological substances: Secondary | ICD-10-CM

## 2016-04-03 DIAGNOSIS — IMO0001 Reserved for inherently not codable concepts without codable children: Secondary | ICD-10-CM

## 2016-04-03 MED ORDER — LISINOPRIL 40 MG PO TABS: 40.0000 mg | ORAL_TABLET | Freq: Every day | ORAL | Status: DC

## 2016-04-03 NOTE — Progress Notes (Signed)
Patient ID: Elizabeth [name], female   DOB: [birth year], 82 y.o.   MRN: [id number]   Subjective:    Patient ID: Elizabeth [name], female    DOB: 08-Feb-1927, 82 y.o.   MRN: [id number]  HPI  Patient here for a scheduled follow up.  She is doing better.  Saw cardiology.  See note for details.  She stays active.  No cardiac symptoms with increased activity or exertion.  At rehab, nurses note her blood pressure is running lower.  She would like to change accupril.  Cost is an issue.  Breathing stable.  No acid reflux.  No abdominal pain or cramping.  Bowels stable.  Increased stress with her husband's medical issues.  She feels she is handling things relatively well.  Saw Dr Valentino Saxon.   Pessary changed.     Past Medical History  Diagnosis Date  . Irritable bowel syndrome   . Hyperlipidemia   . Hypertension   . Cystocele or rectocele with incomplete uterine prolapse   . Vaginal atrophy    Past Surgical History  Procedure Laterality Date  . Cataract extraction  2006   Family History  Problem Relation Age of Onset  . Stroke Father   . Transient ischemic attack Mother     multiple   . Heart disease Brother     MI - died age 15  . Leukemia Brother   . Lung cancer Brother   . Throat cancer Brother     had heart disease also  . Heart disease Sister    Social History   Social History  . Marital Status: Married    Spouse Name: N/A  . Number of Children: 2  . Years of Education: N/A   Social History Main Topics  . Smoking status: Never Smoker   . Smokeless tobacco: Never Used  . Alcohol Use: No  . Drug Use: No  . Sexual Activity: Not Currently    Birth Control/ Protection: None   Other Topics Concern  . None   Social History Narrative    Outpatient Encounter Prescriptions as of 04/03/2016  Medication Sig  . aspirin 81 MG tablet Take 81 mg by mouth daily.  Marland Kitchen atorvastatin (LIPITOR) 40 MG tablet Take 1 tablet (40 mg total) by mouth daily. Name brand only.  . cetirizine (ZYRTEC) 10 MG  tablet Take 10 mg by mouth daily as needed.   . Cholecalciferol (VITAMIN D-3) 1000 UNITS CAPS Take by mouth.  . hydrALAZINE (APRESOLINE) 25 MG tablet Take 1 tablet (25 mg total) by mouth 3 (three) times daily as needed.  . propranolol (INDERAL) 10 MG tablet Take 1 tablet (10 mg total) by mouth 3 (three) times daily as needed.  . triamcinolone (NASACORT) 55 MCG/ACT nasal inhaler Place 2 sprays into the nose daily as needed.   . [DISCONTINUED] ACCUPRIL 40 MG tablet TAKE 1 TABLET BY MOUTH ONCE DAILY.  . [DISCONTINUED] amLODipine (NORVASC) 5 MG tablet   . lisinopril (PRINIVIL,ZESTRIL) 40 MG tablet Take 1 tablet (40 mg total) by mouth daily.   No facility-administered encounter medications on file as of 04/03/2016.    Review of Systems  Constitutional: Negative for appetite change and unexpected weight change.  HENT: Negative for congestion and sinus pressure.   Respiratory: Negative for cough, chest tightness and shortness of breath.   Cardiovascular: Negative for chest pain, palpitations and leg swelling.  Gastrointestinal: Negative for nausea, vomiting, abdominal pain and diarrhea.  Genitourinary: Negative for dysuria and difficulty urinating.  Musculoskeletal: Negative for  back pain and joint swelling.  Neurological: Negative for dizziness, light-headedness and headaches.  Psychiatric/Behavioral: Negative for dysphoric mood and agitation.       Increased stress as outlined.        Objective:     Blood pressure rechecked by me:  132/72  Physical Exam  Constitutional: She appears well-developed and well-nourished. No distress.  HENT:  Nose: Nose normal.  Mouth/Throat: Oropharynx is clear and moist.  Neck: Neck supple. No thyromegaly present.  Cardiovascular: Normal rate and regular rhythm.   Pulmonary/Chest: Breath sounds normal. No respiratory distress. She has no wheezes.  Abdominal: Soft. Bowel sounds are normal. There is no tenderness.  Musculoskeletal: She exhibits no edema or  tenderness.  Lymphadenopathy:    She has no cervical adenopathy.  Skin: No rash noted. No erythema.  Psychiatric: She has a normal mood and affect. Her behavior is normal.    BP 120/70 mmHg  Pulse 76  Temp(Src) 98 F (36.7 C) (Oral)  Resp 18  Ht 5\' 1"  (1.549 m)  Wt 133 lb 8 oz (60.555 kg)  BMI 25.24 kg/m2  SpO2 94% Wt Readings from Last 3 Encounters:  04/03/16 133 lb 8 oz (60.555 kg)  03/01/16 135 lb 14.4 oz (61.644 kg)  01/27/16 135 lb (61.236 kg)     Lab Results  Component Value Date   WBC 6.9 01/26/2016   HGB 14.0 01/26/2016   HCT 43.2 01/26/2016   PLT 254.0 01/26/2016   GLUCOSE 95 01/26/2016   CHOL 208* 01/26/2016   TRIG 145.0 01/26/2016   HDL 49.80 01/26/2016   LDLDIRECT 178.4 11/04/2013   LDLCALC 130* 01/26/2016   ALT 13 01/26/2016   AST 17 01/26/2016   NA 138 01/26/2016   K 4.7 02/13/2016   CL 105 01/26/2016   CREATININE 1.08 01/26/2016   BUN 22 01/26/2016   CO2 24 01/26/2016   TSH 3.62 01/26/2016       Assessment & Plan:   Problem List Items Addressed This Visit    Cystocele, grade 3    Saw Dr Valentino Saxonherry recently.  Pessary change.  Continue f/u with gyn.       Environmental allergies    Stable.       Hypercholesterolemia    Low cholesterol diet and exercise.  Follow lipid panel and liver function tests.  On lipitor.       Relevant Medications   lisinopril (PRINIVIL,ZESTRIL) 40 MG tablet   Other Relevant Orders   Lipid panel   Hepatic function panel   Magnesium   Hyperkalemia    Recheck potassium wnl.       Hypertension - Primary    Blood pressure as outlined.  Low at exercise.  Has not responded as well in the past to generic medication.  Did not control her blood pressure as well.  Cost is an issue.  Will change her accupril to lisinipril 40mg  q day.  Follow pressures.  Get her back in soon to reassess.        Relevant Medications   lisinopril (PRINIVIL,ZESTRIL) 40 MG tablet   Other Relevant Orders   Basic metabolic panel   Stress      Increased stress as outlined.  Does not feel needs any further intervention          Dale DurhamSCOTT, Pax Reasoner, MD

## 2016-04-03 NOTE — Assessment & Plan Note (Signed)
Saw Dr Valentino Saxonherry recently.  Pessary change.  Continue f/u with gyn.

## 2016-04-03 NOTE — Assessment & Plan Note (Signed)
Low cholesterol diet and exercise.  Follow lipid panel and liver function tests.  On lipitor.   

## 2016-04-03 NOTE — Progress Notes (Signed)
Pre-visit discussion using our clinic review tool. No additional management support is needed unless otherwise documented below in the visit note.  

## 2016-04-03 NOTE — Assessment & Plan Note (Signed)
Blood pressure as outlined.  Low at exercise.  Has not responded as well in the past to generic medication.  Did not control her blood pressure as well.  Cost is an issue.  Will change her accupril to lisinipril 40mg  q day.  Follow pressures.  Get her back in soon to reassess.

## 2016-04-08 ENCOUNTER — Encounter: Payer: Self-pay | Admitting: Internal Medicine

## 2016-04-08 NOTE — Assessment & Plan Note (Signed)
Recheck potassium wnl.

## 2016-04-08 NOTE — Assessment & Plan Note (Signed)
Stable

## 2016-04-08 NOTE — Assessment & Plan Note (Signed)
Increased stress as outlined.  Does not feel needs any further intervention.  

## 2016-04-09 ENCOUNTER — Other Ambulatory Visit: Payer: Self-pay | Admitting: Internal Medicine

## 2016-04-19 IMAGING — MR MR HEAD W/O CM
10 series · 48 of 48 positions shown · non-contrast
Comparison: Report of brain MRI 10/12/2004 (no images available).

CLINICAL DATA: 89-year-old female with headache, left arm numbness
and tingling. Recent hypertension. Initial encounter.

EXAM:
MRI HEAD WITHOUT CONTRAST
TECHNIQUE: Multiplanar, multiecho pulse sequences of the brain and surrounding
structures were obtained without intravenous contrast.

[Series 2: T1 · sagittal · 5.0mm · 0.45mm/px · 4 of 25 slices shown (1 of 2)]
[im 1/25]
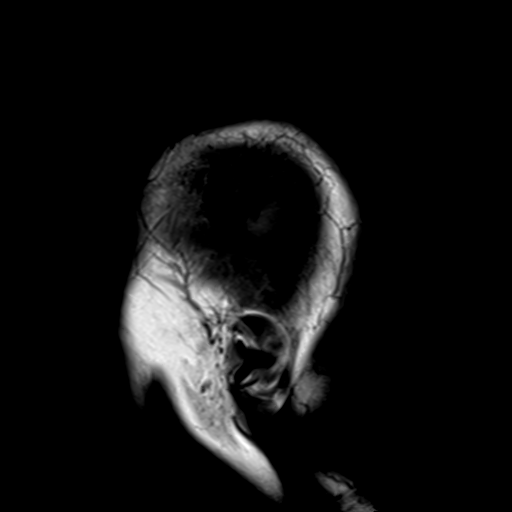
[im 9/25]
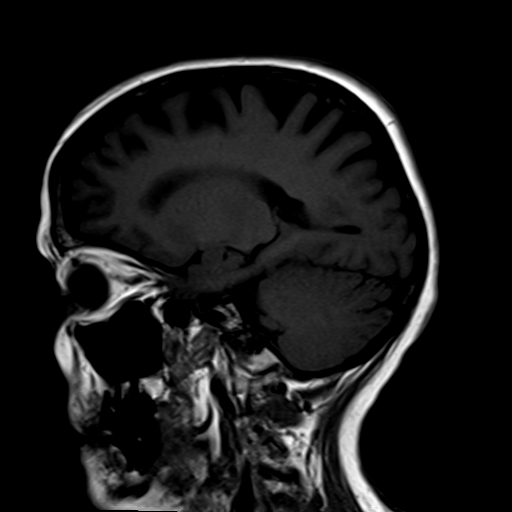
[im 17/25]
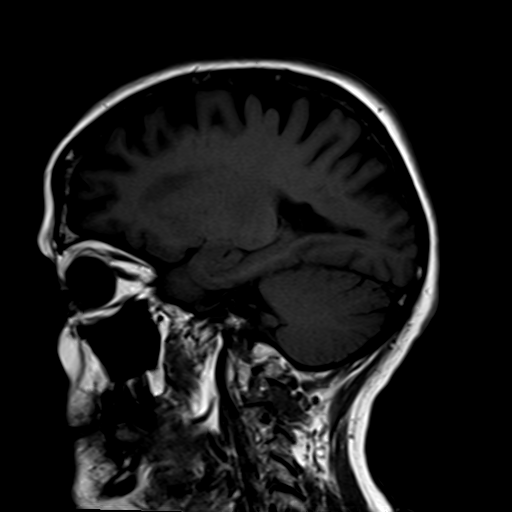
[im 25/25]
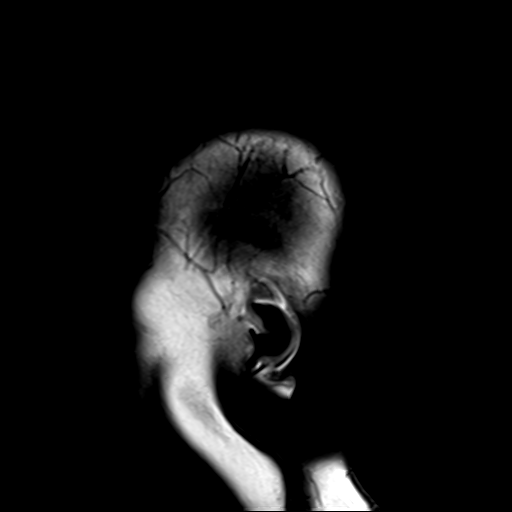

[Series 7: T2 · axial · 5.0mm · 0.60mm/px · z∈[-35,+121]mm · 3 of 25 slices shown (1 of 3)]
[im 1/25]
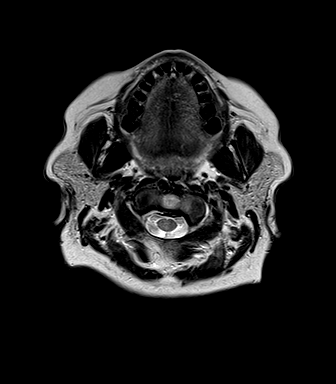
[im 13/25]
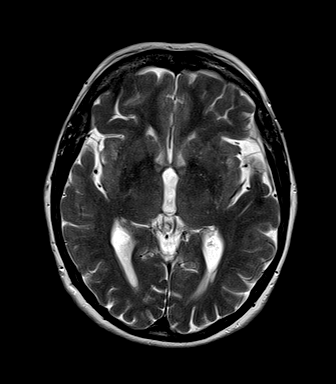
[im 25/25]
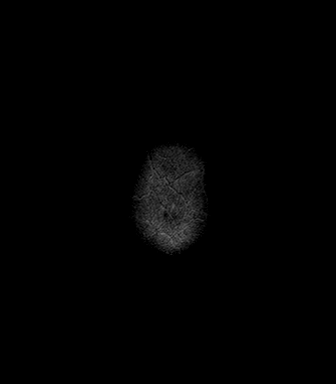

[Series 8: FLAIR · axial · 5.0mm · 0.45mm/px · z∈[-35,+121]mm · 3 of 25 slices shown]
[im 1/25]
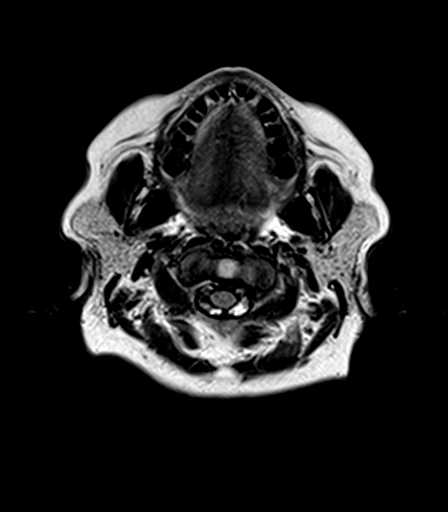
[im 13/25]
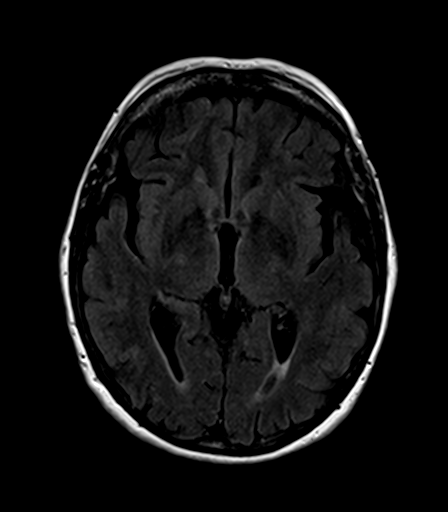
[im 25/25]
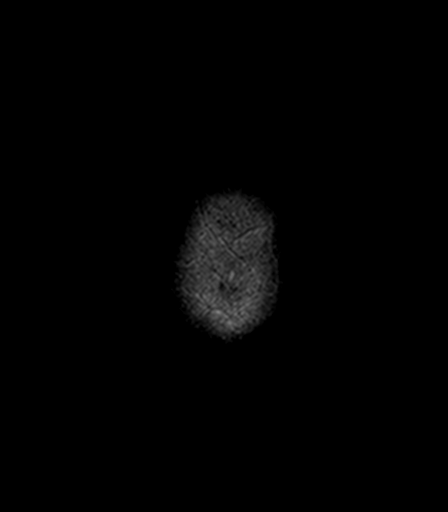

[Series 9: T2 · axial · 5.0mm · 0.45mm/px · z∈[-35,+121]mm · 3 of 25 slices shown (2 of 3)]
[im 1/25]
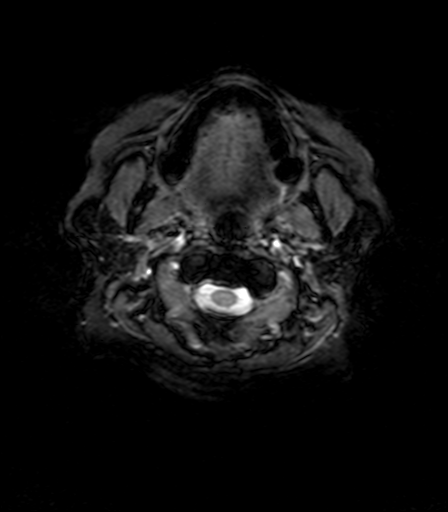
[im 13/25]
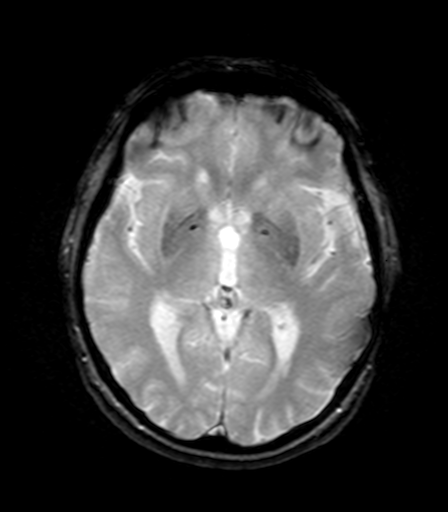
[im 25/25]
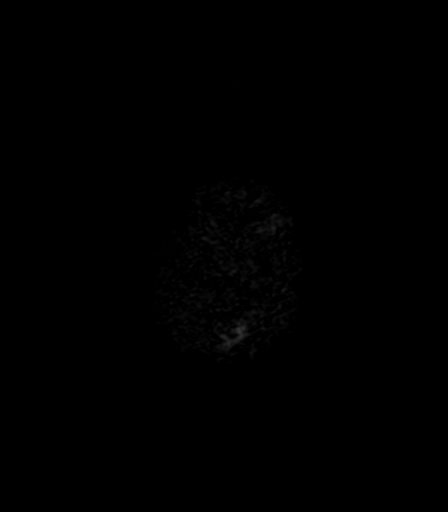

[Series 10: T1 · axial · 3.0mm · 1.00mm/px · z∈[-40,+125]mm · 7 of 56 slices shown (2 of 2)]
[im 1/56]
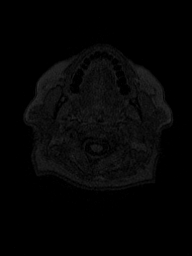
[im 10/56]
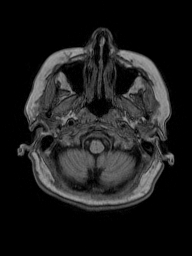
[im 19/56]
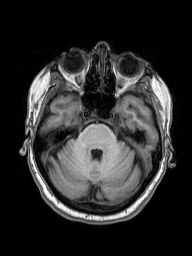
[im 28/56]
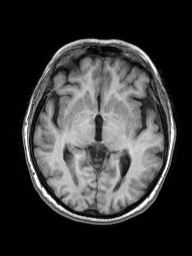
[im 37/56]
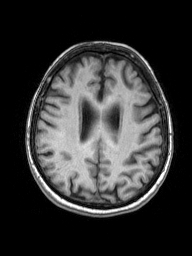
[im 46/56]
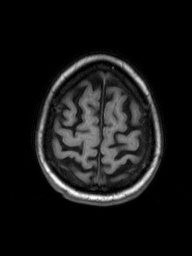
[im 56/56]
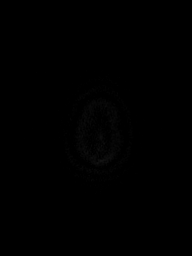

[Series 11: T2 · coronal · 5.0mm · 0.49mm/px · 3 of 27 slices shown (3 of 3)]
[im 1/27]
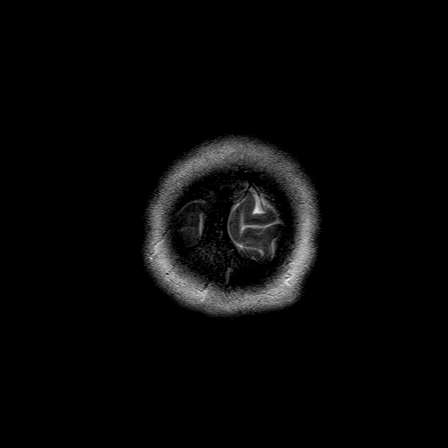
[im 14/27]
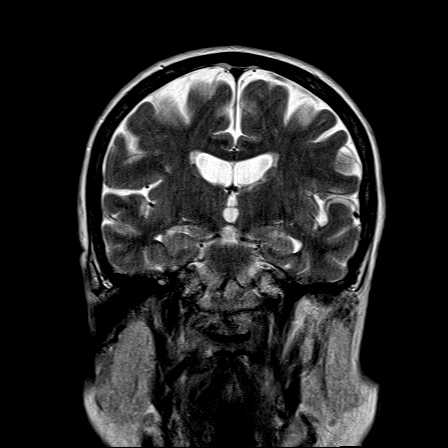
[im 27/27]
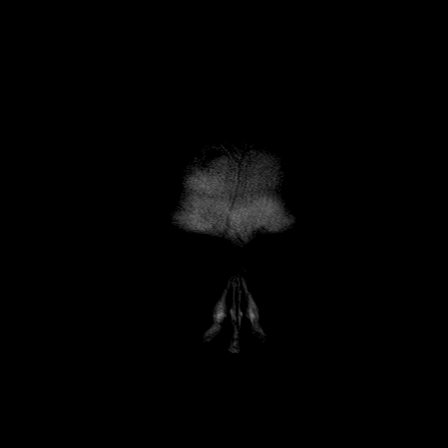

[Series 100: DWI · axial · 3.0mm · 1.80mm/px · z∈[-28,+122]mm · 6 of 51 slices shown (1 of 2)]
[im 1/51]
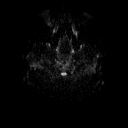
[im 11/51]
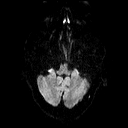
[im 21/51]
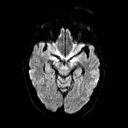
[im 31/51]
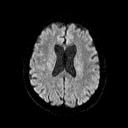
[im 41/51]
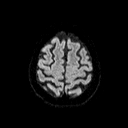
[im 51/51]
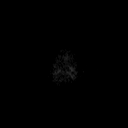

[Series 101: ADC · axial · 3.0mm · 1.80mm/px · z∈[-40,+122]mm · 7 of 55 slices shown (1 of 2)]
[im 1/55]
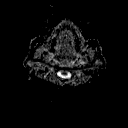
[im 10/55]
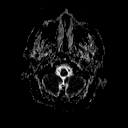
[im 19/55]
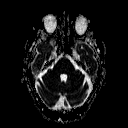
[im 28/55]
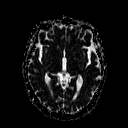
[im 37/55]
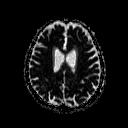
[im 46/55]
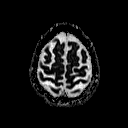
[im 55/55]
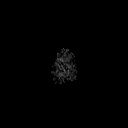

[Series 102: DWI · coronal · 3.0mm · 1.80mm/px · 6 of 47 slices shown (2 of 2)]
[im 1/47]
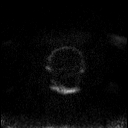
[im 10/47]
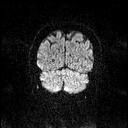
[im 19/47]
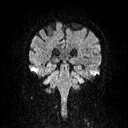
[im 28/47]
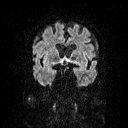
[im 37/47]
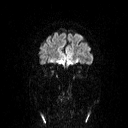
[im 47/47]
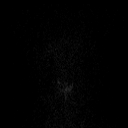

[Series 103: ADC · coronal · 3.0mm · 1.80mm/px · 6 of 47 slices shown (2 of 2)]
[im 1/47]
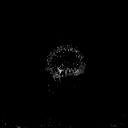
[im 10/47]
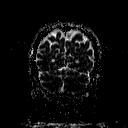
[im 19/47]
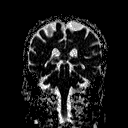
[im 28/47]
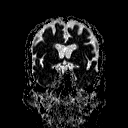
[im 37/47]
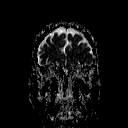
[im 47/47]
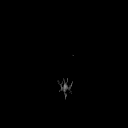

[48 of 48 positions shown; findings below may reference images not displayed]

FINDINGS: Cerebral volume is within normal limits for age. No restricted
diffusion to suggest acute infarction. No midline shift, mass
effect, evidence of mass lesion, ventriculomegaly, extra-axial
collection or acute intracranial hemorrhage. Cervicomedullary
junction and pituitary are within normal limits. Negative visualized
cervical spine. Major intracranial vascular flow voids are within
normal limits.

Minimal to mild for age scattered small nonspecific cerebral white
matter T2 and FLAIR hyperintense foci. No cortical encephalomalacia,
or definite chronic cerebral blood products. Deep gray matter
nuclei, brainstem, and cerebellum are normal for age. Visible
internal auditory structures appear normal.

Paranasal sinuses and mastoids are clear. Postoperative changes to
both globes. Negative orbit and scalp soft tissues. Normal bone
marrow signal.
IMPRESSION: No acute intracranial abnormality and essentially normal for age
noncontrast MRI appearance of the brain.

## 2016-05-10 ENCOUNTER — Encounter: Payer: Self-pay | Admitting: Obstetrics and Gynecology

## 2016-05-10 ENCOUNTER — Ambulatory Visit (INDEPENDENT_AMBULATORY_CARE_PROVIDER_SITE_OTHER): Payer: Medicare Other | Admitting: Obstetrics and Gynecology

## 2016-05-10 VITALS — BP 126/69 | HR 87 | Ht 61.0 in | Wt 133.5 lb

## 2016-05-10 DIAGNOSIS — N814 Uterovaginal prolapse, unspecified: Secondary | ICD-10-CM

## 2016-05-10 DIAGNOSIS — N952 Postmenopausal atrophic vaginitis: Secondary | ICD-10-CM

## 2016-05-10 DIAGNOSIS — Z4689 Encounter for fitting and adjustment of other specified devices: Secondary | ICD-10-CM

## 2016-05-10 MED ORDER — ESTRADIOL 0.1 MG/GM VA CREA: 1.0000 | TOPICAL_CREAM | VAGINAL | Status: DC

## 2016-05-11 NOTE — Progress Notes (Signed)
    GYNECOLOGY PROGRESS NOTE  Subjective:    Patient ID: Elizabeth [name], female    DOB: 07/19/1927, 82 y.o.   MRN: [id number]  HPI  Patient is a 82 y.o. 172P2002 female who presents for pessary check. She reports light vaginal spotting over past 2 days. She is noting some pelvic discomfort but denies difficulty urinating.  Requested different type of pessary last visit, was concerned about stem and worried about dislodging as tip can be felt when wiping.   The following portions of the patient's history were reviewed and updated as appropriate: allergies, current medications, past family history, past medical history, past social history, past surgical history and problem list.   Review of Systems Pertinent items are noted in HPI.   Objective:    Blood pressure 126/69, pulse 87, height 5\' 1"  (1.549 m), weight 133 lb 8 oz (60.555 kg). General appearance: alert and no distress Pelvis: The patient's  Size 2 3/4 Gelhorn pessary was removed. Speculum examination revealed normal but atrophic vaginal mucosa with small area of irritation on anterior vaginal wall with spotting noted.  Vagina with small amount of thin yellowish watery fluid.  Urethra with presence of caruncle.  New Size 2 3/4 Gelhorn pessary with shortened stem placed.    Assessment:   Pessary maintenance Vaginal atrophy Cystocele grade 3, Uterine prolapse Grade 1  Plan:   Old Gelhorn size 2 3/4 removed, and new one with shortened stem placed without difficulty. Advised on holding Trimo-San gel, to use Estrace internally twice weekly (has only been using externally every now and then for urethral caruncle).  RTC in 10 weeks for next pessary check.      Hildred LaserAnika Niv Darley, MD [facility]

## 2016-06-14 ENCOUNTER — Telehealth: Payer: Self-pay | Admitting: Internal Medicine

## 2016-06-14 NOTE — Telephone Encounter (Signed)
Pt needs an authorization on the brand name ACCUPRIL 40 MG tablet. [facility] script number is [phone] fax number is [phone]. Pt can only take the name brand. Generic gives her bad side effects.

## 2016-06-14 NOTE — Telephone Encounter (Signed)
Patient has enough refills. Please advise.

## 2016-06-15 NOTE — Telephone Encounter (Signed)
PA initiated through [facility]

## 2016-06-15 NOTE — Telephone Encounter (Signed)
Please call Elizabeth [name] and clarify.  Per her last visit with me, she had wanted to change the name brand accupril secondary to cost.  I changed her to lisinopril 40mg  q day.  See note.  Is she wanting the accupril now?  If so, the will need a prior authorization.  She has tried other ace inhibitors and had intolerance.  If questions about how to do a prior authorization, will need to ask someone in the office on how to do the PA.  Thanks  Let me know if any problems.

## 2016-06-15 NOTE — Telephone Encounter (Signed)
Spoke to patient she stated that she needs to be back on Accupril 40mg  instead of Lisinopril.  The Lisinopril makes her very dizzy. Her insurance need a Prior Authorization before filling her with the new medication.

## 2016-06-18 NOTE — Telephone Encounter (Signed)
PA form faxed for approval decision

## 2016-06-20 ENCOUNTER — Encounter: Payer: Self-pay | Admitting: *Deleted

## 2016-06-20 NOTE — Telephone Encounter (Signed)
Please notify pt that insurance denied coverage for the accupril.

## 2016-06-20 NOTE — Telephone Encounter (Signed)
Sent mychart message

## 2016-06-20 NOTE — Telephone Encounter (Signed)
Received a denial & placed in Dr. Lorin PicketScott folder to initiate appeals progress.

## 2016-06-21 ENCOUNTER — Other Ambulatory Visit (INDEPENDENT_AMBULATORY_CARE_PROVIDER_SITE_OTHER): Payer: Medicare Other

## 2016-06-21 ENCOUNTER — Encounter: Payer: Self-pay | Admitting: Internal Medicine

## 2016-06-21 DIAGNOSIS — E7800 Pure hypercholesterolemia, unspecified: Secondary | ICD-10-CM

## 2016-06-21 DIAGNOSIS — I10 Essential (primary) hypertension: Secondary | ICD-10-CM

## 2016-06-21 LAB — LIPID PANEL
CHOL/HDL RATIO: 4
Cholesterol: 228 mg/dL — ABNORMAL HIGH (ref 0–200)
HDL: 52.4 mg/dL (ref >39.00)
LDL CALC: 147 mg/dL — AB (ref 0–99)
NONHDL: 175.78
TRIGLYCERIDES: 142 mg/dL (ref 0.0–149.0)
VLDL: 28.4 mg/dL (ref 0.0–40.0)

## 2016-06-21 LAB — HEPATIC FUNCTION PANEL
ALK PHOS: 64 U/L (ref 39–117)
ALT: 18 U/L (ref 0–35)
AST: 20 U/L (ref 0–37)
Albumin: 4.3 g/dL (ref 3.5–5.2)
BILIRUBIN DIRECT: 0 mg/dL (ref 0.0–0.3)
Total Bilirubin: 0.5 mg/dL (ref 0.2–1.2)
Total Protein: 7.2 g/dL (ref 6.0–8.3)

## 2016-06-21 LAB — BASIC METABOLIC PANEL
BUN: 24 mg/dL — AB (ref 6–23)
CHLORIDE: 106 meq/L (ref 96–112)
CO2: 24 meq/L (ref 19–32)
CREATININE: 1.06 mg/dL (ref 0.40–1.20)
Calcium: 9.3 mg/dL (ref 8.4–10.5)
GFR: 52.07 mL/min — ABNORMAL LOW (ref >60.00)
Glucose, Bld: 102 mg/dL — ABNORMAL HIGH (ref 70–99)
POTASSIUM: 4.6 meq/L (ref 3.5–5.1)
Sodium: 137 mEq/L (ref 135–145)

## 2016-06-21 LAB — MAGNESIUM: Magnesium: 2.2 mg/dL (ref 1.5–2.5)

## 2016-06-25 ENCOUNTER — Ambulatory Visit (INDEPENDENT_AMBULATORY_CARE_PROVIDER_SITE_OTHER): Payer: Medicare Other | Admitting: Internal Medicine

## 2016-06-25 ENCOUNTER — Encounter: Payer: Self-pay | Admitting: Internal Medicine

## 2016-06-25 VITALS — BP 110/60 | HR 79 | Temp 98.1°F | Resp 18 | Ht 61.0 in | Wt 133.0 lb

## 2016-06-25 DIAGNOSIS — E7800 Pure hypercholesterolemia, unspecified: Secondary | ICD-10-CM | POA: Diagnosis not present

## 2016-06-25 DIAGNOSIS — F439 Reaction to severe stress, unspecified: Secondary | ICD-10-CM

## 2016-06-25 DIAGNOSIS — Z658 Other specified problems related to psychosocial circumstances: Secondary | ICD-10-CM

## 2016-06-25 DIAGNOSIS — I10 Essential (primary) hypertension: Secondary | ICD-10-CM | POA: Diagnosis not present

## 2016-06-25 DIAGNOSIS — Z9109 Other allergy status, other than to drugs and biological substances: Secondary | ICD-10-CM

## 2016-06-25 DIAGNOSIS — R000 Tachycardia, unspecified: Secondary | ICD-10-CM

## 2016-06-25 DIAGNOSIS — Z91048 Other nonmedicinal substance allergy status: Secondary | ICD-10-CM | POA: Diagnosis not present

## 2016-06-25 DIAGNOSIS — N8110 Cystocele, unspecified: Secondary | ICD-10-CM

## 2016-06-25 NOTE — Progress Notes (Signed)
Patient ID: Elizabeth [name], female   DOB: 02/25/1927, 82 y.o.   MRN: [id number]   Subjective:    Patient ID: Elizabeth Haymakerolleen C [name], female    DOB: 04/04/1927, 82 y.o.   MRN: [id number]  HPI  Patient here for a scheduled follow up.  Has pessary in place.  Saw Dr Valentino Saxonherry 05/10/16 for pessary check.  Reported some pelvic discomfort.  No difficulty urinating.  She had pessary removed and one with shorter stem placed.  This is better.  She had been having some spotting.  Dr Valentino Saxonherry evaluated.  Placed on estrogen cream.  Has f/u soon.  Last visit, placed on lisinopril.  This dried her out.  Unable to take.  Back on accupril.  Tolerating.  Blood pressure varies, but overall ok.  Some occasional heart fluttering.  Stable.  No chest pain.  breathign stable.  No abdominal pain or cramping.  Bowels stable.     Past Medical History  Diagnosis Date  . Irritable bowel syndrome   . Hyperlipidemia   . Hypertension   . Cystocele or rectocele with incomplete uterine prolapse   . Vaginal atrophy    Past Surgical History  Procedure Laterality Date  . Cataract extraction  2006   Family History  Problem Relation Age of Onset  . Stroke Father   . Transient ischemic attack Mother     multiple   . Heart disease Brother     MI - died age 80  . Leukemia Brother   . Lung cancer Brother   . Throat cancer Brother     had heart disease also  . Heart disease Sister    Social History   Social History  . Marital Status: Married    Spouse Name: N/A  . Number of Children: 2  . Years of Education: N/A   Social History Main Topics  . Smoking status: Never Smoker   . Smokeless tobacco: Never Used  . Alcohol Use: No  . Drug Use: No  . Sexual Activity: Not Currently    Birth Control/ Protection: None   Other Topics Concern  . None   Social History Narrative    Outpatient Encounter Prescriptions as of 06/25/2016  Medication Sig  . ACCUPRIL 40 MG tablet TAKE 1 TABLET BY MOUTH ONCE DAILY.  Marland Kitchen. aspirin 81 MG tablet  Take 81 mg by mouth daily.  Marland Kitchen. atorvastatin (LIPITOR) 40 MG tablet Take 1 tablet (40 mg total) by mouth daily. Name brand only.  . cetirizine (ZYRTEC) 10 MG tablet Take 10 mg by mouth daily as needed.   . Cholecalciferol (VITAMIN D-3) 1000 UNITS CAPS Take by mouth.  . estradiol (ESTRACE VAGINAL) 0.1 MG/GM vaginal cream Place 1 Applicatorful vaginally 2 (two) times a week.  . propranolol (INDERAL) 10 MG tablet Take 1 tablet (10 mg total) by mouth 3 (three) times daily as needed.  . triamcinolone (NASACORT) 55 MCG/ACT nasal inhaler Place 2 sprays into the nose daily as needed.   . [DISCONTINUED] lisinopril (PRINIVIL,ZESTRIL) 40 MG tablet Take 1 tablet (40 mg total) by mouth daily.  . hydrALAZINE (APRESOLINE) 25 MG tablet Take 1 tablet (25 mg total) by mouth 3 (three) times daily as needed. (Patient not taking: Reported on 06/25/2016)   No facility-administered encounter medications on file as of 06/25/2016.    Review of Systems  Constitutional: Negative for appetite change and unexpected weight change.  HENT: Negative for congestion and sinus pressure.   Respiratory: Negative for cough, chest tightness and shortness of breath.  Cardiovascular: Positive for palpitations. Negative for chest pain and leg swelling.  Gastrointestinal: Negative for nausea, vomiting, abdominal pain and diarrhea.  Genitourinary: Negative for dysuria and difficulty urinating.  Musculoskeletal: Negative for back pain and joint swelling.  Skin: Negative for color change and rash.  Neurological: Negative for dizziness, light-headedness and headaches.  Psychiatric/Behavioral: Negative for dysphoric mood and agitation.       Objective:     Blood pressure rechecked by me:  130/78  Physical Exam  Constitutional: She appears well-developed and well-nourished. No distress.  HENT:  Nose: Nose normal.  Mouth/Throat: Oropharynx is clear and moist.  Neck: Neck supple. No thyromegaly present.  Cardiovascular: Normal rate and  regular rhythm.   Pulmonary/Chest: Breath sounds normal. No respiratory distress. She has no wheezes.  Abdominal: Soft. Bowel sounds are normal. There is no tenderness.  Musculoskeletal: She exhibits no edema or tenderness.  Lymphadenopathy:    She has no cervical adenopathy.  Skin: No rash noted. No erythema.  Psychiatric: She has a normal mood and affect. Her behavior is normal.    BP 110/60 mmHg  Pulse 79  Temp(Src) 98.1 F (36.7 C) (Oral)  Resp 18  Ht 5\' 1"  (1.549 m)  Wt 133 lb (60.328 kg)  BMI 25.14 kg/m2  SpO2 95% Wt Readings from Last 3 Encounters:  06/25/16 133 lb (60.328 kg)  05/10/16 133 lb 8 oz (60.555 kg)  04/03/16 133 lb 8 oz (60.555 kg)     Lab Results  Component Value Date   WBC 6.9 01/26/2016   HGB 14.0 01/26/2016   HCT 43.2 01/26/2016   PLT 254.0 01/26/2016   GLUCOSE 102* 06/21/2016   CHOL 228* 06/21/2016   TRIG 142.0 06/21/2016   HDL 52.40 06/21/2016   LDLDIRECT 178.4 11/04/2013   LDLCALC 147* 06/21/2016   ALT 18 06/21/2016   AST 20 06/21/2016   NA 137 06/21/2016   K 4.6 06/21/2016   CL 106 06/21/2016   CREATININE 1.06 06/21/2016   BUN 24* 06/21/2016   CO2 24 06/21/2016   TSH 3.62 01/26/2016       Assessment & Plan:   Problem List Items Addressed This Visit    Environmental allergies    Stable.       Female bladder prolapse    Pt has a pessary in place.  Seeing Dr Valentino Saxonherry.        Hypercholesterolemia    Had been off lipitor.  Recent cholesterol elevated.  Discussed with her today.  Back on lipitor daily.  Follow.   Lab Results  Component Value Date   CHOL 228* 06/21/2016   HDL 52.40 06/21/2016   LDLCALC 147* 06/21/2016   LDLDIRECT 178.4 11/04/2013   TRIG 142.0 06/21/2016   CHOLHDL 4 06/21/2016        Relevant Orders   Lipid panel   Hepatic function panel   Hypertension - Primary    Blood pressure overall doing well.  Same medication regimen.  Follow pressures.  On accupril.  Remain on.        Relevant Orders   Basic  metabolic panel   Stress    Increased stress.  Does not feel needs anything more at this time.  Follow.       Tachycardia    Saw cardiology.  Overall she feel is stable.  Follow.            Dale DurhamSCOTT, Ngozi Alvidrez, MD

## 2016-06-25 NOTE — Progress Notes (Signed)
Pre-visit discussion using our clinic review tool. No additional management support is needed unless otherwise documented below in the visit note.  

## 2016-06-26 ENCOUNTER — Encounter: Payer: Self-pay | Admitting: Internal Medicine

## 2016-06-26 NOTE — Assessment & Plan Note (Signed)
Blood pressure overall doing well.  Same medication regimen.  Follow pressures.  On accupril.  Remain on.

## 2016-06-26 NOTE — Assessment & Plan Note (Signed)
Pt has a pessary in place.  Seeing Dr Valentino Saxonherry.

## 2016-06-26 NOTE — Assessment & Plan Note (Signed)
Stable

## 2016-06-26 NOTE — Assessment & Plan Note (Signed)
Increased stress.  Does not feel needs anything more at this time.  Follow.   

## 2016-06-26 NOTE — Assessment & Plan Note (Signed)
Saw cardiology.  Overall she feel is stable.  Follow.

## 2016-06-26 NOTE — Assessment & Plan Note (Signed)
Had been off lipitor.  Recent cholesterol elevated.  Discussed with her today.  Back on lipitor daily.  Follow.   Lab Results  Component Value Date   CHOL 228* 06/21/2016   HDL 52.40 06/21/2016   LDLCALC 147* 06/21/2016   LDLDIRECT 178.4 11/04/2013   TRIG 142.0 06/21/2016   CHOLHDL 4 06/21/2016

## 2016-07-19 ENCOUNTER — Ambulatory Visit (INDEPENDENT_AMBULATORY_CARE_PROVIDER_SITE_OTHER): Payer: Medicare Other | Admitting: Obstetrics and Gynecology

## 2016-07-19 ENCOUNTER — Encounter: Payer: Self-pay | Admitting: Obstetrics and Gynecology

## 2016-07-19 VITALS — BP 123/80 | HR 76 | Wt 134.5 lb

## 2016-07-19 DIAGNOSIS — Z960 Presence of urogenital implants: Secondary | ICD-10-CM

## 2016-07-19 DIAGNOSIS — Z9289 Personal history of other medical treatment: Secondary | ICD-10-CM | POA: Diagnosis not present

## 2016-07-19 DIAGNOSIS — IMO0001 Reserved for inherently not codable concepts without codable children: Secondary | ICD-10-CM

## 2016-07-19 DIAGNOSIS — N812 Incomplete uterovaginal prolapse: Secondary | ICD-10-CM | POA: Diagnosis not present

## 2016-07-19 DIAGNOSIS — N362 Urethral caruncle: Secondary | ICD-10-CM | POA: Diagnosis not present

## 2016-07-19 DIAGNOSIS — IMO0002 Reserved for concepts with insufficient information to code with codable children: Principal | ICD-10-CM

## 2016-07-19 DIAGNOSIS — N952 Postmenopausal atrophic vaginitis: Secondary | ICD-10-CM

## 2016-07-19 DIAGNOSIS — N8110 Cystocele, unspecified: Secondary | ICD-10-CM

## 2016-07-21 ENCOUNTER — Encounter: Payer: Self-pay | Admitting: Obstetrics and Gynecology

## 2016-07-21 NOTE — Progress Notes (Signed)
    GYNECOLOGY PROGRESS NOTE  Subjective:    Patient ID: Elizabeth [name], female    DOB: 01/15/1927, 82 y.o.   MRN: [id number]  HPI  Patient is a 82 y.o. G1P2002 female who presents for pessary check.  She denies pelvic discomfort, difficulty urinating. Does report occasional mild constipation, however alternates with constipation and diarrhea.  Unsure if this is due to her IBS or the pessary.  No said the short stem of her new pessary is working well for her.    The following portions of the patient's history were reviewed and updated as appropriate: allergies, current medications, past family history, past medical history, past social history, past surgical history and problem list.   Review of Systems Pertinent items are noted in HPI.   Objective:    Blood pressure 123/80, pulse 76, weight 134 lb 8 oz (61 kg). General appearance: alert and no distress Pelvis: The patient's  Size 2 3/4 Gelhorn pessary was removed, cleaned, and reinserted without difficulty. Speculum examination revealed normal but atrophic vaginal mucosa with urethral caruncle. Vagina with small amount of thin yellowish watery fluid.    Assessment:   Pessary maintenance Vaginal atrophy Cystocele grade 3, Uterine prolapse Grade 1 Urethral caruncle  Plan:   Pessary cleaned today and replaced without difficulty.   Advised on continuing to use Estrace internally twice weekly.  Can resume Trimo-San gel as needed. RTC in 10-12 weeks for next pessary check.      Hildred Laser, MD [facility]

## 2016-07-26 ENCOUNTER — Ambulatory Visit (INDEPENDENT_AMBULATORY_CARE_PROVIDER_SITE_OTHER): Payer: Medicare Other | Admitting: Cardiovascular Disease

## 2016-07-26 ENCOUNTER — Encounter: Payer: Self-pay | Admitting: Cardiovascular Disease

## 2016-07-26 VITALS — BP 120/64 | HR 88 | Ht 61.5 in | Wt 133.2 lb

## 2016-07-26 DIAGNOSIS — R0602 Shortness of breath: Secondary | ICD-10-CM

## 2016-07-26 DIAGNOSIS — I10 Essential (primary) hypertension: Secondary | ICD-10-CM

## 2016-07-26 DIAGNOSIS — E7800 Pure hypercholesterolemia, unspecified: Secondary | ICD-10-CM | POA: Diagnosis not present

## 2016-07-26 DIAGNOSIS — Z658 Other specified problems related to psychosocial circumstances: Secondary | ICD-10-CM

## 2016-07-26 DIAGNOSIS — I499 Cardiac arrhythmia, unspecified: Secondary | ICD-10-CM | POA: Diagnosis not present

## 2016-07-26 DIAGNOSIS — F439 Reaction to severe stress, unspecified: Secondary | ICD-10-CM

## 2016-07-26 DIAGNOSIS — R000 Tachycardia, unspecified: Secondary | ICD-10-CM

## 2016-07-26 NOTE — Patient Instructions (Addendum)
Medication Instructions:   If you have tachycardia or bad palpitations, Ok to take prorpanolol 1 to 2 as needed  Labwork:  No new labs  Testing/Procedures:  No further testing  Follow-Up: It was a pleasure seeing you in the office today. Please call us if you have new issues that need to be addressed before your next appt.  [phone]  Your physician wants you to follow-up in: 12 months.  You will receive a reminder letter in the mail two months in advance. If you don't receive a letter, please call our office to schedule the follow-up appointment.  If you need a refill on your cardiac medications before your next appointment, please call your pharmacy.

## 2016-07-26 NOTE — Progress Notes (Signed)
Cardiology Office Note  Date:  07/26/2016   ID:  Elizabeth [name], DOB 30-Jun-1927, MRN [id number]  PCP:  Dale Shoshone, MD   Chief Complaint  Patient presents with  . Other    6 month f/u no complaints today. Meds reviewed verbally with pt.    HPI:  Elizabeth [name] is a very pleasant 82 year old woman with history of hypertension, hyperlipidemia who presents for  Follow-up of her arrhythmia, lightheadedness,  Hypertension    she continues to have stress at home,  She is the primary caretaker for her husband who has underlying medical issues  In follow-up, she reports that her Husband in the hospital, having seizures She is missing doses of accupril , takes it at lunch Some palpitations, at rest, momentary lightheadedness when standing No further severe tachycardia like last visit Blood pressure sometimes running low, other times elevated more often in the evening  Total chol 228, LDL 147 06/21/2016, previously 160  Skipping lipitor  Does not want to take propranolol (nervous about pills)   EKG on today's visit shows normal sinus rhythm with rate 88 bpm, no significant ST or T-wave changes,  Poor R-wave progression to the anterior precordial leads  Other past medical hx previous episode of severe tachycardia , persisted longer than other episodes ,  Seem to go away on its own without intervention.  She does have significant stress at home, taking care of her husband. She is the primary caretaker   long history of short runs of palpitations, she has had these for 20 years  previously was using the treadmill 4-5 times per week She tried a lower dose of benazepril, reported her systolic pressure was 170 in the evenings on 20 mg daily, went back to 40 mg daily  previously seen by Dr. Jenne Campus with stress testing, echocardiogram, Holter monitor at that time. She reports that the Holter monitor was relatively unrevealing. She was told in the past that she had a heart murmur, echocardiogram  showing valve calcification. Follow-up echocardiogram 2 by her report did not confirm any valve disease. She passed the stress test with "flying colors"  PMH:   has a past medical history of Cystocele or rectocele with incomplete uterine prolapse; Hyperlipidemia; Hypertension; Irritable bowel syndrome; and Vaginal atrophy.  PSH:    Past Surgical History:  Procedure Laterality Date  . CATARACT EXTRACTION  2006    Current Outpatient Prescriptions  Medication Sig Dispense Refill  . ACCUPRIL 40 MG tablet TAKE 1 TABLET BY MOUTH ONCE DAILY. 30 tablet 11  . aspirin 81 MG tablet Take 81 mg by mouth daily.    Marland Kitchen atorvastatin (LIPITOR) 40 MG tablet Take 1 tablet (40 mg total) by mouth daily. Name brand only. 30 tablet 5  . cetirizine (ZYRTEC) 10 MG tablet Take 10 mg by mouth daily as needed.     . Cholecalciferol (VITAMIN D-3) 1000 UNITS CAPS Take by mouth.    . estradiol (ESTRACE VAGINAL) 0.1 MG/GM vaginal cream Place 1 Applicatorful vaginally 2 (two) times a week. 42.5 g 2  . hydrALAZINE (APRESOLINE) 25 MG tablet Take 1 tablet (25 mg total) by mouth 3 (three) times daily as needed. 90 tablet 6  . propranolol (INDERAL) 10 MG tablet Take 1 tablet (10 mg total) by mouth 3 (three) times daily as needed. 90 tablet 3  . triamcinolone (NASACORT) 55 MCG/ACT nasal inhaler Place 2 sprays into the nose daily as needed.      No current facility-administered medications for this visit.  Allergies:   Review of patient's allergies indicates no known allergies.   Social History:  The patient  reports that she has never smoked. She has never used smokeless tobacco. She reports that she does not drink alcohol or use drugs.   Family History:   family history includes Heart disease in her brother and sister; Leukemia in her brother; Lung cancer in her brother; Stroke in her father; Throat cancer in her brother; Transient ischemic attack in her mother.    Review of Systems: Review of Systems   Constitutional: Negative.   Respiratory: Negative.   Cardiovascular: Positive for palpitations.  Gastrointestinal: Negative.   Musculoskeletal: Negative.   Neurological: Negative.   Psychiatric/Behavioral: The patient is nervous/anxious.   All other systems reviewed and are negative.    PHYSICAL EXAM: VS:  BP 120/64 (BP Location: Left Arm, Patient Position: Sitting, Cuff Size: Normal)   Pulse 88   Ht 5' 1.5" (1.562 m)   Wt 133 lb 4 oz (60.4 kg)   BMI 24.77 kg/m  , BMI Body mass index is 24.77 kg/m. GEN: Well nourished, well developed, in no acute distress, somewhat anxious  HEENT: normal  Neck: no JVD, carotid bruits, or masses Cardiac: RRR; no murmurs, rubs, or gallops,no edema , ectopy appreciated on auscultation Respiratory:  clear to auscultation bilaterally, normal work of breathing GI: soft, nontender, nondistended, + BS MS: no deformity or atrophy  Skin: warm and dry, no rash Neuro:  Strength and sensation are intact Psych: euthymic mood, full affect    Recent Labs: 01/26/2016: Hemoglobin 14.0; Platelets 254.0; TSH 3.62 06/21/2016: ALT 18; BUN 24; Creatinine, Ser 1.06; Magnesium 2.2; Potassium 4.6; Sodium 137    Lipid Panel Lab Results  Component Value Date   CHOL 228 (H) 06/21/2016   HDL 52.40 06/21/2016   LDLCALC 147 (H) 06/21/2016   TRIG 142.0 06/21/2016      Wt Readings from Last 3 Encounters:  07/26/16 133 lb 4 oz (60.4 kg)  07/19/16 134 lb 8 oz (61 kg)  06/25/16 133 lb (60.3 kg)       ASSESSMENT AND PLAN:  Cardiac arrhythmia, unspecified cardiac arrhythmia type - Plan: EKG 12-Lead Likely having APCs or PVCs Appreciated on auscultation though not documented on EKG Sometimes symptomatic though does not want propranolol  Essential hypertension - Plan: EKG 12-Lead Blood pressure is well controlled on today's visit. No changes made to the medications.  Hypercholesterolemia Forgetting to take her Lipitor, cholesterol has climbed  significantly Reports she will start retaking her Lipitor  Stress Significant anxiety, stress Exacerbated by husband's medical issues  SOB (shortness of breath) Denies having significant shortness of breath on exertion. No further workup at this time  Tachycardia Previous episode of severe tachycardia, unable to exclude atrial tachycardia, flutter or even atrial fibrillation. Denies having any further episodes   Long discussion concerning the health of her husband, stress this places on her Stress reduction techniques  Total encounter time more than 25 minutes  Greater than 50% was spent in counseling and coordination of care with the patient    Disposition:   F/U  12 months   Orders Placed This Encounter  Procedures  . EKG 12-Lead     Signed, Dossie Arbour, M.D., Ph.D. 07/26/2016  [facility] [place], [place] [phone]

## 2016-08-08 ENCOUNTER — Other Ambulatory Visit: Payer: Self-pay | Admitting: Internal Medicine

## 2016-08-10 ENCOUNTER — Telehealth: Payer: Self-pay

## 2016-08-10 NOTE — Telephone Encounter (Signed)
PA for lipitor completed on cover my meds approved form 07/11/2016-08/10/2017

## 2016-08-27 ENCOUNTER — Other Ambulatory Visit: Payer: Self-pay | Admitting: Internal Medicine

## 2016-09-20 ENCOUNTER — Ambulatory Visit: Payer: Medicare Other | Admitting: Obstetrics and Gynecology

## 2016-09-24 ENCOUNTER — Other Ambulatory Visit (INDEPENDENT_AMBULATORY_CARE_PROVIDER_SITE_OTHER): Payer: Medicare Other

## 2016-09-24 DIAGNOSIS — I10 Essential (primary) hypertension: Secondary | ICD-10-CM | POA: Diagnosis not present

## 2016-09-24 DIAGNOSIS — E7800 Pure hypercholesterolemia, unspecified: Secondary | ICD-10-CM | POA: Diagnosis not present

## 2016-09-24 LAB — HEPATIC FUNCTION PANEL
ALK PHOS: 64 U/L (ref 39–117)
ALT: 18 U/L (ref 0–35)
AST: 19 U/L (ref 0–37)
Albumin: 4.1 g/dL (ref 3.5–5.2)
BILIRUBIN DIRECT: 0 mg/dL (ref 0.0–0.3)
BILIRUBIN TOTAL: 0.4 mg/dL (ref 0.2–1.2)
Total Protein: 6.8 g/dL (ref 6.0–8.3)

## 2016-09-24 LAB — LIPID PANEL
Cholesterol: 177 mg/dL (ref 0–200)
HDL: 56.7 mg/dL (ref >39.00)
LDL CALC: 100 mg/dL — AB (ref 0–99)
NonHDL: 120.43
Total CHOL/HDL Ratio: 3
Triglycerides: 101 mg/dL (ref 0.0–149.0)
VLDL: 20.2 mg/dL (ref 0.0–40.0)

## 2016-09-24 LAB — BASIC METABOLIC PANEL
BUN: 19 mg/dL (ref 6–23)
CO2: 28 mEq/L (ref 19–32)
Calcium: 9 mg/dL (ref 8.4–10.5)
Chloride: 105 mEq/L (ref 96–112)
Creatinine, Ser: 1.01 mg/dL (ref 0.40–1.20)
GFR: 55.02 mL/min — AB (ref >60.00)
GLUCOSE: 95 mg/dL (ref 70–99)
POTASSIUM: 5.2 meq/L — AB (ref 3.5–5.1)
Sodium: 139 mEq/L (ref 135–145)

## 2016-09-25 ENCOUNTER — Encounter: Payer: Self-pay | Admitting: Internal Medicine

## 2016-09-26 ENCOUNTER — Ambulatory Visit: Payer: Medicare Other | Admitting: Obstetrics and Gynecology

## 2016-09-27 ENCOUNTER — Encounter: Payer: Self-pay | Admitting: Obstetrics and Gynecology

## 2016-09-27 ENCOUNTER — Ambulatory Visit (INDEPENDENT_AMBULATORY_CARE_PROVIDER_SITE_OTHER): Payer: Medicare Other | Admitting: Obstetrics and Gynecology

## 2016-09-27 VITALS — BP 133/78 | HR 79 | Wt 131.7 lb

## 2016-09-27 DIAGNOSIS — N939 Abnormal uterine and vaginal bleeding, unspecified: Secondary | ICD-10-CM

## 2016-09-27 DIAGNOSIS — N920 Excessive and frequent menstruation with regular cycle: Secondary | ICD-10-CM | POA: Diagnosis not present

## 2016-09-27 DIAGNOSIS — Z4689 Encounter for fitting and adjustment of other specified devices: Secondary | ICD-10-CM | POA: Diagnosis not present

## 2016-09-27 DIAGNOSIS — N812 Incomplete uterovaginal prolapse: Secondary | ICD-10-CM

## 2016-09-27 DIAGNOSIS — N8110 Cystocele, unspecified: Secondary | ICD-10-CM | POA: Diagnosis not present

## 2016-09-27 DIAGNOSIS — N362 Urethral caruncle: Secondary | ICD-10-CM

## 2016-09-27 DIAGNOSIS — N952 Postmenopausal atrophic vaginitis: Secondary | ICD-10-CM | POA: Diagnosis not present

## 2016-09-27 NOTE — Progress Notes (Signed)
    GYNECOLOGY PROGRESS NOTE  Subjective:    Patient ID: Elizabeth [name], female    DOB: 05/06/1927, 82 y.o.   MRN: [id number]  HPI  Patient is a 82 y.o. 522P2002 female who presents for pessary check.  She denies pelvic discomfort, difficulty urinating.  Does report a few episodes of light spotting. Notes that it may have been due to stress or increased activity as she has recently been having to care for her husband who was hospitalized several weeks ago for a stroke.   The following portions of the patient's history were reviewed and updated as appropriate: allergies, current medications, past family history, past medical history, past social history, past surgical history and problem list.   Review of Systems Pertinent items are noted in HPI.   Objective:    Blood pressure 133/78, pulse 79, weight 131 lb 11.2 oz (59.7 kg). General appearance: alert and no distress Pelvis: The patient's  Size 2 3/4 Gelhorn pessary was removed, cleaned, and reinserted without difficulty. Speculum examination revealed normal but atrophic vaginal mucosa with urethral caruncle. Vagina with small amount of thin yellowish watery fluid. Cervix with small area of mucosal irritation noted at 10-11 o'clock.    Assessment:   Pessary maintenance Vaginal atrophy Cystocele grade 3, Uterine prolapse Grade 1 Urethral caruncle Vaginal spotting.   Plan:   - Pessary cleaned today and replaced without difficulty.   - Advised on continuing to use Estrace internally twice weekly.  Notes that she has forgotten to use several times due to being so busy taking care of her husband. Could also be a reason for occasional spotting (in addition to stress and increased activity, and cervical irritation).   - RTC in 10-12 weeks for next pessary check.       Hildred LaserAnika Delaine Hernandez, MD [facility]

## 2016-09-28 ENCOUNTER — Encounter: Payer: Self-pay | Admitting: Internal Medicine

## 2016-09-28 ENCOUNTER — Ambulatory Visit (INDEPENDENT_AMBULATORY_CARE_PROVIDER_SITE_OTHER): Payer: Medicare Other | Admitting: Internal Medicine

## 2016-09-28 VITALS — BP 120/74 | HR 75 | Temp 98.2°F | Ht 60.5 in | Wt 131.4 lb

## 2016-09-28 DIAGNOSIS — I10 Essential (primary) hypertension: Secondary | ICD-10-CM | POA: Diagnosis not present

## 2016-09-28 DIAGNOSIS — Z23 Encounter for immunization: Secondary | ICD-10-CM

## 2016-09-28 DIAGNOSIS — N812 Incomplete uterovaginal prolapse: Secondary | ICD-10-CM

## 2016-09-28 DIAGNOSIS — Z9109 Other allergy status, other than to drugs and biological substances: Secondary | ICD-10-CM

## 2016-09-28 DIAGNOSIS — I499 Cardiac arrhythmia, unspecified: Secondary | ICD-10-CM

## 2016-09-28 DIAGNOSIS — E7800 Pure hypercholesterolemia, unspecified: Secondary | ICD-10-CM

## 2016-09-28 DIAGNOSIS — E875 Hyperkalemia: Secondary | ICD-10-CM

## 2016-09-28 DIAGNOSIS — F439 Reaction to severe stress, unspecified: Secondary | ICD-10-CM | POA: Diagnosis not present

## 2016-09-28 DIAGNOSIS — Z0000 Encounter for general adult medical examination without abnormal findings: Secondary | ICD-10-CM | POA: Diagnosis not present

## 2016-09-28 LAB — POTASSIUM: Potassium: 4.5 mEq/L (ref 3.5–5.1)

## 2016-09-28 NOTE — Progress Notes (Signed)
Pre visit review using our clinic review tool, if applicable. No additional management support is needed unless otherwise documented below in the visit note. 

## 2016-09-28 NOTE — Progress Notes (Signed)
Patient ID: Elizabeth [name], female   DOB: 05-19-1927, 82 y.o.   MRN: [id number]   Subjective:    Patient ID: Elizabeth [name], female    DOB: 11-16-1927, 82 y.o.   MRN: [id number]  HPI  Patient here for her physical exam.  She has been under increased stress recently with her husband's health issues.  He has been in rehab and has just come home.  Overall she feels she is handling things relatively well.  No chest pain.  Some previous palpitations.  She saw cardiology.  Felt to have benign pvc's/pac's.  See Dr Windell Hummingbird note.  No acid reflux.  No abdominal pain or cramping.  Bowels stable.  Tries to stay active.  No exercising as much.     Past Medical History:  Diagnosis Date  . Cystocele or rectocele with incomplete uterine prolapse   . Hyperlipidemia   . Hypertension   . Irritable bowel syndrome   . Vaginal atrophy    Past Surgical History:  Procedure Laterality Date  . CATARACT EXTRACTION  2006   Family History  Problem Relation Age of Onset  . Transient ischemic attack Mother     multiple   . Stroke Father   . Heart disease Brother     MI - died age 59  . Leukemia Brother   . Lung cancer Brother   . Throat cancer Brother     had heart disease also  . Heart disease Sister    Social History   Social History  . Marital status: Married    Spouse name: N/A  . Number of children: 2  . Years of education: N/A   Social History Main Topics  . Smoking status: Never Smoker  . Smokeless tobacco: Never Used  . Alcohol use No  . Drug use: No  . Sexual activity: Not Currently    Birth control/ protection: None   Other Topics Concern  . None   Social History Narrative  . None    Outpatient Encounter Prescriptions as of 09/28/2016  Medication Sig  . ACCUPRIL 40 MG tablet TAKE 1 TABLET BY MOUTH ONCE DAILY.  Marland Kitchen ACCUPRIL 40 MG tablet TAKE ONE TABLET BY MOUTH ONCE DAILY  . aspirin 81 MG tablet Take 81 mg by mouth daily.  . cetirizine (ZYRTEC) 10 MG tablet Take 10 mg by mouth  daily as needed.   . Cholecalciferol (VITAMIN D-3) 1000 UNITS CAPS Take by mouth.  . estradiol (ESTRACE VAGINAL) 0.1 MG/GM vaginal cream Place 1 Applicatorful vaginally 2 (two) times a week.  . hydrALAZINE (APRESOLINE) 25 MG tablet Take 1 tablet (25 mg total) by mouth 3 (three) times daily as needed.  Marland Kitchen LIPITOR 40 MG tablet Take 1 tablet (40 mg total) by mouth daily.  . propranolol (INDERAL) 10 MG tablet Take 1 tablet (10 mg total) by mouth 3 (three) times daily as needed.  . triamcinolone (NASACORT) 55 MCG/ACT nasal inhaler Place 2 sprays into the nose daily as needed.    No facility-administered encounter medications on file as of 09/28/2016.     Review of Systems  Constitutional: Negative for appetite change and unexpected weight change.  HENT: Negative for congestion and sinus pressure.   Eyes: Negative for pain and visual disturbance.  Respiratory: Negative for cough, chest tightness and shortness of breath.   Cardiovascular: Positive for palpitations. Negative for chest pain and leg swelling.  Gastrointestinal: Negative for abdominal pain, diarrhea, nausea and vomiting.  Genitourinary: Negative for difficulty urinating and dysuria.  Musculoskeletal: Negative for back pain and joint swelling.  Skin: Negative for color change and rash.  Neurological: Negative for dizziness, light-headedness and headaches.  Hematological: Negative for adenopathy. Does not bruise/bleed easily.  Psychiatric/Behavioral: Negative for agitation and dysphoric mood.       Objective:    Physical Exam  Constitutional: She is oriented to person, place, and time. She appears well-developed and well-nourished. No distress.  HENT:  Nose: Nose normal.  Mouth/Throat: Oropharynx is clear and moist.  Eyes: Right eye exhibits no discharge. Left eye exhibits no discharge. No scleral icterus.  Neck: Neck supple. No thyromegaly present.  Cardiovascular: Normal rate and regular rhythm.   Pulmonary/Chest: Breath  sounds normal. No accessory muscle usage. No tachypnea. No respiratory distress. She has no decreased breath sounds. She has no wheezes. She has no rhonchi. Right breast exhibits no inverted nipple, no mass, no nipple discharge and no tenderness (no axillary adenopathy). Left breast exhibits no inverted nipple, no mass, no nipple discharge and no tenderness (no axilarry adenopathy).  Abdominal: Soft. Bowel sounds are normal. There is no tenderness.  Musculoskeletal: She exhibits no edema or tenderness.  Lymphadenopathy:    She has no cervical adenopathy.  Neurological: She is alert and oriented to person, place, and time.  Skin: Skin is warm. No rash noted. No erythema.  Psychiatric: She has a normal mood and affect. Her behavior is normal.    BP 120/74   Pulse 75   Temp 98.2 F (36.8 C) (Oral)   Ht 5' 0.5" (1.537 m)   Wt 131 lb 6.4 oz (59.6 kg)   SpO2 98%   BMI 25.24 kg/m  Wt Readings from Last 3 Encounters:  09/28/16 131 lb 6.4 oz (59.6 kg)  09/27/16 131 lb 11.2 oz (59.7 kg)  07/26/16 133 lb 4 oz (60.4 kg)     Lab Results  Component Value Date   WBC 6.9 01/26/2016   HGB 14.0 01/26/2016   HCT 43.2 01/26/2016   PLT 254.0 01/26/2016   GLUCOSE 95 09/24/2016   CHOL 177 09/24/2016   TRIG 101.0 09/24/2016   HDL 56.70 09/24/2016   LDLDIRECT 178.4 11/04/2013   LDLCALC 100 (H) 09/24/2016   ALT 18 09/24/2016   AST 19 09/24/2016   NA 139 09/24/2016   K 4.5 09/28/2016   CL 105 09/24/2016   CREATININE 1.01 09/24/2016   BUN 19 09/24/2016   CO2 28 09/24/2016   TSH 3.62 01/26/2016       Assessment & Plan:   Problem List Items Addressed This Visit    Arrhythmia    Previous palpitations.  Saw Dr Mariah Milling.  Felt to be related to pvc's/pac's.  Currently doing well.  Follow.       Environmental allergies    Stable.       First degree uterine prolapse    Seeing gyn.  Pessary in place.  Just checked.  Note reviewed.       Health care maintenance    Physical today 09/28/16.   Declines mammogram.       Hypercholesterolemia    Low cholesterol diet and exercise.  On lipitor.  Follow lipid panel and liver function tests.   Lab Results  Component Value Date   CHOL 177 09/24/2016   HDL 56.70 09/24/2016   LDLCALC 100 (H) 09/24/2016   LDLDIRECT 178.4 11/04/2013   TRIG 101.0 09/24/2016   CHOLHDL 3 09/24/2016        Hyperkalemia - Primary    Recheck potassium today.  Relevant Orders   Potassium (Completed)   Hypertension    Blood pressure under good control.  Some variation with her checks at home.  Doing well on my check. Continue same medication regimen.  Follow pressures.  Follow metabolic panel.        Stress    Increased stress as outlined.  Discussed with her today.  She does not feel needs any further intervention at this time.  Follow.         Other Visit Diagnoses    Encounter for immunization       Relevant Orders   Flu vaccine HIGH DOSE PF (Completed)       Dale DurhamSCOTT, Esra Frankowski, MD

## 2016-09-29 ENCOUNTER — Encounter: Payer: Self-pay | Admitting: Internal Medicine

## 2016-09-29 NOTE — Assessment & Plan Note (Signed)
Blood pressure under good control.  Some variation with her checks at home.  Doing well on my check. Continue same medication regimen.  Follow pressures.  Follow metabolic panel.

## 2016-09-29 NOTE — Assessment & Plan Note (Signed)
Stable

## 2016-09-29 NOTE — Assessment & Plan Note (Signed)
Physical today 09/28/16.  Declines mammogram.

## 2016-09-29 NOTE — Assessment & Plan Note (Signed)
Recheck potassium today. 

## 2016-09-29 NOTE — Assessment & Plan Note (Signed)
Increased stress as outlined.  Discussed with her today.  She does not feel needs any further intervention at this time.  Follow.   

## 2016-09-29 NOTE — Assessment & Plan Note (Signed)
Low cholesterol diet and exercise.  On lipitor.  Follow lipid panel and liver function tests.   Lab Results  Component Value Date   CHOL 177 09/24/2016   HDL 56.70 09/24/2016   LDLCALC 100 (H) 09/24/2016   LDLDIRECT 178.4 11/04/2013   TRIG 101.0 09/24/2016   CHOLHDL 3 09/24/2016

## 2016-09-29 NOTE — Assessment & Plan Note (Signed)
Seeing gyn.  Pessary in place.  Just checked.  Note reviewed.

## 2016-09-29 NOTE — Assessment & Plan Note (Signed)
Previous palpitations.  Saw Dr Mariah MillingGollan.  Felt to be related to pvc's/pac's.  Currently doing well.  Follow.

## 2016-10-22 ENCOUNTER — Other Ambulatory Visit: Payer: Self-pay

## 2016-10-22 DIAGNOSIS — N952 Postmenopausal atrophic vaginitis: Secondary | ICD-10-CM

## 2016-10-22 MED ORDER — ESTRADIOL 0.1 MG/GM VA CREA: 1.0000 | TOPICAL_CREAM | VAGINAL | 2 refills | Status: DC

## 2016-11-29 ENCOUNTER — Ambulatory Visit (INDEPENDENT_AMBULATORY_CARE_PROVIDER_SITE_OTHER): Payer: Medicare Other | Admitting: Obstetrics and Gynecology

## 2016-11-29 VITALS — BP 109/65 | HR 84 | Ht 60.5 in | Wt 131.7 lb

## 2016-11-29 DIAGNOSIS — Z4689 Encounter for fitting and adjustment of other specified devices: Secondary | ICD-10-CM

## 2016-11-29 DIAGNOSIS — N362 Urethral caruncle: Secondary | ICD-10-CM | POA: Diagnosis not present

## 2016-11-29 DIAGNOSIS — N814 Uterovaginal prolapse, unspecified: Secondary | ICD-10-CM | POA: Diagnosis not present

## 2016-11-29 DIAGNOSIS — N952 Postmenopausal atrophic vaginitis: Secondary | ICD-10-CM

## 2016-11-29 DIAGNOSIS — N939 Abnormal uterine and vaginal bleeding, unspecified: Secondary | ICD-10-CM

## 2016-11-29 DIAGNOSIS — N920 Excessive and frequent menstruation with regular cycle: Secondary | ICD-10-CM

## 2016-11-29 NOTE — Progress Notes (Signed)
    GYNECOLOGY PROGRESS NOTE  Subjective:    Patient ID: Elizabeth [name], female    DOB: 10/30/1927, 82 y.o.   MRN: [id number]  HPI  Patient is a 82 y.o. 822P2002 female who presents for pessary check.  She denies pelvic discomfort, difficulty urinating.  Still noting a few episodes of light spotting. Still thinks it is due to stress or increased activity as she is having to devote more time to caring for her husband who recently appears to be going into liver failure (Of note, he also had a stroke several months ago and has difficulty ambulating).   The following portions of the patient's history were reviewed and updated as appropriate: allergies, current medications, past family history, past medical history, past social history, past surgical history and problem list.   Review of Systems Pertinent items are noted in HPI.   Objective:    Blood pressure 109/65, pulse 84, height 5' 0.5" (1.537 m), weight 131 lb 11.2 oz (59.7 kg). General appearance: alert and no distress  Abdomen: soft, non-tender. No organomegaly or masses.  Pelvis: The patient's  Size 2 3/4 Gelhorn pessary was removed, cleaned, and reinserted without difficulty. Speculum examination revealed normal but atrophic vaginal mucosa with urethral caruncle. Vagina with small amount of thin yellowish watery fluid. Cervix  with small area of mucosal irritation noted at 10-11 o'clock. Left vaginal wall with small area of mucosal irritation as well.  Extremities: varicose veins appreciated, non-tender, no edema or erythema. Neurologic: grossly normal  Assessment:   Pessary maintenance Vaginal atrophy Cystocele grade 3, Uterine prolapse Grade 1 Urethral caruncle Vaginal spotting.   Plan:   - Pessary cleaned today and replaced without difficulty.   - Advised on continuing to use Estrace internally twice weekly.  Notes that she often times forgets to use due to being so busy taking care of her husband. Could also be a reason for  occasional spotting (in addition to stress and increased activity, and mucosal irritation).   - RTC in 8-10 weeks for next pessary check.       Hildred LaserAnika Aleya Durnell, MD [facility]

## 2016-12-06 ENCOUNTER — Ambulatory Visit: Payer: Medicare Other | Admitting: Obstetrics and Gynecology

## 2017-01-22 ENCOUNTER — Ambulatory Visit: Payer: Medicare Other | Admitting: Cardiovascular Disease

## 2017-01-23 ENCOUNTER — Other Ambulatory Visit: Payer: Self-pay | Admitting: Internal Medicine

## 2017-01-23 ENCOUNTER — Telehealth: Payer: Self-pay | Admitting: Radiology

## 2017-01-23 DIAGNOSIS — E7800 Pure hypercholesterolemia, unspecified: Secondary | ICD-10-CM

## 2017-01-23 DIAGNOSIS — I10 Essential (primary) hypertension: Secondary | ICD-10-CM

## 2017-01-23 NOTE — Progress Notes (Signed)
Order placed for labs.

## 2017-01-23 NOTE — Telephone Encounter (Signed)
PT coming for labs Friday, please place orders. Thank you.

## 2017-01-23 NOTE — Telephone Encounter (Signed)
Order placed for labs.

## 2017-01-25 ENCOUNTER — Other Ambulatory Visit (INDEPENDENT_AMBULATORY_CARE_PROVIDER_SITE_OTHER): Payer: Medicare Other

## 2017-01-25 DIAGNOSIS — E7800 Pure hypercholesterolemia, unspecified: Secondary | ICD-10-CM

## 2017-01-25 DIAGNOSIS — I10 Essential (primary) hypertension: Secondary | ICD-10-CM

## 2017-01-25 LAB — BASIC METABOLIC PANEL
BUN: 24 mg/dL — AB (ref 6–23)
CALCIUM: 9.2 mg/dL (ref 8.4–10.5)
CHLORIDE: 105 meq/L (ref 96–112)
CO2: 28 meq/L (ref 19–32)
CREATININE: 1.09 mg/dL (ref 0.40–1.20)
GFR: 50.35 mL/min — ABNORMAL LOW (ref >60.00)
GLUCOSE: 92 mg/dL (ref 70–99)
Potassium: 5.3 mEq/L — ABNORMAL HIGH (ref 3.5–5.1)
Sodium: 138 mEq/L (ref 135–145)

## 2017-01-25 LAB — CBC WITH DIFFERENTIAL/PLATELET
Basophils Absolute: 0.1 10*3/uL (ref 0.0–0.1)
Basophils Relative: 0.8 % (ref 0.0–3.0)
EOS PCT: 3.1 % (ref 0.0–5.0)
Eosinophils Absolute: 0.2 10*3/uL (ref 0.0–0.7)
HCT: 41.9 % (ref 36.0–46.0)
Hemoglobin: 13.9 g/dL (ref 12.0–15.0)
LYMPHS ABS: 2.2 10*3/uL (ref 0.7–4.0)
Lymphocytes Relative: 32.2 % (ref 12.0–46.0)
MCHC: 33.3 g/dL (ref 30.0–36.0)
MCV: 92.3 fl (ref 78.0–100.0)
MONO ABS: 0.4 10*3/uL (ref 0.1–1.0)
Monocytes Relative: 6.5 % (ref 3.0–12.0)
NEUTROS ABS: 3.9 10*3/uL (ref 1.4–7.7)
NEUTROS PCT: 57.4 % (ref 43.0–77.0)
PLATELETS: 258 10*3/uL (ref 150.0–400.0)
RBC: 4.54 Mil/uL (ref 3.87–5.11)
RDW: 13.8 % (ref 11.5–15.5)
WBC: 6.9 10*3/uL (ref 4.0–10.5)

## 2017-01-25 LAB — LIPID PANEL
CHOL/HDL RATIO: 3
Cholesterol: 175 mg/dL (ref 0–200)
HDL: 55.9 mg/dL (ref >39.00)
LDL Cholesterol: 98 mg/dL (ref 0–99)
NONHDL: 118.61
Triglycerides: 102 mg/dL (ref 0.0–149.0)
VLDL: 20.4 mg/dL (ref 0.0–40.0)

## 2017-01-25 LAB — HEPATIC FUNCTION PANEL
ALBUMIN: 4.3 g/dL (ref 3.5–5.2)
ALK PHOS: 59 U/L (ref 39–117)
ALT: 19 U/L (ref 0–35)
AST: 18 U/L (ref 0–37)
BILIRUBIN DIRECT: 0.1 mg/dL (ref 0.0–0.3)
BILIRUBIN TOTAL: 0.5 mg/dL (ref 0.2–1.2)
Total Protein: 6.4 g/dL (ref 6.0–8.3)

## 2017-01-25 LAB — TSH: TSH: 4 u[IU]/mL (ref 0.35–4.50)

## 2017-01-30 ENCOUNTER — Encounter: Payer: Self-pay | Admitting: Internal Medicine

## 2017-01-30 ENCOUNTER — Ambulatory Visit (INDEPENDENT_AMBULATORY_CARE_PROVIDER_SITE_OTHER): Payer: Medicare Other | Admitting: Internal Medicine

## 2017-01-30 VITALS — BP 124/62 | HR 91 | Temp 98.5°F | Resp 16 | Ht 61.0 in | Wt 133.6 lb

## 2017-01-30 DIAGNOSIS — Z9289 Personal history of other medical treatment: Secondary | ICD-10-CM | POA: Diagnosis not present

## 2017-01-30 DIAGNOSIS — E875 Hyperkalemia: Secondary | ICD-10-CM

## 2017-01-30 DIAGNOSIS — E7800 Pure hypercholesterolemia, unspecified: Secondary | ICD-10-CM

## 2017-01-30 DIAGNOSIS — R0602 Shortness of breath: Secondary | ICD-10-CM

## 2017-01-30 DIAGNOSIS — I10 Essential (primary) hypertension: Secondary | ICD-10-CM

## 2017-01-30 DIAGNOSIS — Z960 Presence of urogenital implants: Secondary | ICD-10-CM

## 2017-01-30 LAB — POTASSIUM: Potassium: 4.7 mEq/L (ref 3.5–5.1)

## 2017-01-30 NOTE — Progress Notes (Signed)
Pre-visit discussion using our clinic review tool. No additional management support is needed unless otherwise documented below in the visit note.  

## 2017-01-30 NOTE — Progress Notes (Signed)
Patient ID: Elizabeth HaymakerColleen C Colvard, female   DOB: 06/17/1927, 83 y.o.   MRN: [id number]   Subjective:    Patient ID: Elizabeth Haymakerolleen C Kristensen, female    DOB: 01/29/1927, 83 y.o.   MRN: [id number]  HPI  Patient here for a scheduled follow up.  She reports she is handling stress relatively well.  Trying to stay active.  No chest pain.  No sob.  No acid reflux.  No abdominal pain or cramping.  Bowel stable.  Seeing gyn for pessary change.  Blood pressure stable.    Past Medical History:  Diagnosis Date  . Cystocele or rectocele with incomplete uterine prolapse   . Hyperlipidemia   . Hypertension   . Irritable bowel syndrome   . Vaginal atrophy    Past Surgical History:  Procedure Laterality Date  . CATARACT EXTRACTION  2006   Family History  Problem Relation Age of Onset  . Transient ischemic attack Mother     multiple   . Stroke Father   . Heart disease Brother     MI - died age 356  . Leukemia Brother   . Lung cancer Brother   . Throat cancer Brother     had heart disease also  . Heart disease Sister    Social History   Social History  . Marital status: Married    Spouse name: N/A  . Number of children: 2  . Years of education: N/A   Social History Main Topics  . Smoking status: Never Smoker  . Smokeless tobacco: Never Used  . Alcohol use No  . Drug use: No  . Sexual activity: Not Currently    Birth control/ protection: None   Other Topics Concern  . None   Social History Narrative  . None    Outpatient Encounter Prescriptions as of 01/30/2017  Medication Sig  . ACCUPRIL 40 MG tablet TAKE 1 TABLET BY MOUTH ONCE DAILY.  Marland Kitchen. aspirin 81 MG tablet Take 81 mg by mouth daily.  . cetirizine (ZYRTEC) 10 MG tablet Take 10 mg by mouth daily as needed.   . Cholecalciferol (VITAMIN D-3) 1000 UNITS CAPS Take by mouth.  . estradiol (ESTRACE VAGINAL) 0.1 MG/GM vaginal cream Place 1 Applicatorful vaginally 2 (two) times a week.  . hydrALAZINE (APRESOLINE) 25 MG tablet Take 1 tablet (25 mg total)  by mouth 3 (three) times daily as needed.  Marland Kitchen. LIPITOR 40 MG tablet Take 1 tablet (40 mg total) by mouth daily.  . propranolol (INDERAL) 10 MG tablet Take 1 tablet (10 mg total) by mouth 3 (three) times daily as needed.  . triamcinolone (NASACORT) 55 MCG/ACT nasal inhaler Place 2 sprays into the nose daily as needed.   . [DISCONTINUED] ACCUPRIL 40 MG tablet TAKE ONE TABLET BY MOUTH ONCE DAILY   No facility-administered encounter medications on file as of 01/30/2017.     Review of Systems  Constitutional: Negative for appetite change and unexpected weight change.  HENT: Negative for congestion and sinus pressure.   Respiratory: Negative for cough, chest tightness and shortness of breath.   Cardiovascular: Negative for chest pain, palpitations and leg swelling.  Gastrointestinal: Negative for abdominal pain, diarrhea, nausea and vomiting.  Genitourinary: Negative for difficulty urinating and dysuria.  Musculoskeletal: Negative for back pain and joint swelling.  Skin: Negative for color change and rash.  Neurological: Negative for dizziness, light-headedness and headaches.  Psychiatric/Behavioral: Negative for agitation and dysphoric mood.       Objective:     Blood pressure  rechecked by me:  134/72  Physical Exam  Constitutional: She appears well-developed and well-nourished. No distress.  HENT:  Nose: Nose normal.  Mouth/Throat: Oropharynx is clear and moist.  Neck: Neck supple. No thyromegaly present.  Cardiovascular: Normal rate and regular rhythm.   Pulmonary/Chest: Breath sounds normal. No respiratory distress. She has no wheezes.  Abdominal: Soft. Bowel sounds are normal. There is no tenderness.  Musculoskeletal: She exhibits no edema or tenderness.  Lymphadenopathy:    She has no cervical adenopathy.  Skin: No rash noted. No erythema.  Psychiatric: She has a normal mood and affect. Her behavior is normal.    BP 124/62 (BP Location: Right Arm, Patient Position: Sitting,  Cuff Size: Large)   Pulse 91   Temp 98.5 F (36.9 C) (Oral)   Resp 16   Ht 5\' 1"  (1.549 m)   Wt 133 lb 9.6 oz (60.6 kg)   SpO2 98%   BMI 25.24 kg/m  Wt Readings from Last 3 Encounters:  02/07/17 132 lb (59.9 kg)  01/30/17 133 lb 9.6 oz (60.6 kg)  11/29/16 131 lb 11.2 oz (59.7 kg)     Lab Results  Component Value Date   WBC 6.9 01/25/2017   HGB 13.9 01/25/2017   HCT 41.9 01/25/2017   PLT 258.0 01/25/2017   GLUCOSE 92 01/25/2017   CHOL 175 01/25/2017   TRIG 102.0 01/25/2017   HDL 55.90 01/25/2017   LDLDIRECT 178.4 11/04/2013   LDLCALC 98 01/25/2017   ALT 19 01/25/2017   AST 18 01/25/2017   NA 138 01/25/2017   K 4.7 01/30/2017   CL 105 01/25/2017   CREATININE 1.09 01/25/2017   BUN 24 (H) 01/25/2017   CO2 28 01/25/2017   TSH 4.00 01/25/2017       Assessment & Plan:   Problem List Items Addressed This Visit    Hypercholesterolemia    On lipitor.  Low cholesterol diet and exercise.  Follow lipid panel and liver function tests.        Hyperkalemia - Primary    Recheck potassium today.        Relevant Orders   Potassium (Completed)   Hypertension    Blood pressure under good control.  Continue same medication regimen.  Follow pressures.  Follow metabolic panel.  Needs name brand accupril.  Has tried generic.  Did not control blood pressure.        SOB (shortness of breath)    Increased stress.  Some better.  Handling things relatively well.  Follow.        Vaginal pessary in situ    Sees gyn.            Dale , MD

## 2017-01-31 ENCOUNTER — Other Ambulatory Visit: Payer: Self-pay | Admitting: Internal Medicine

## 2017-01-31 ENCOUNTER — Encounter: Payer: Self-pay | Admitting: Internal Medicine

## 2017-02-01 ENCOUNTER — Telehealth: Payer: Self-pay | Admitting: Radiology

## 2017-02-01 NOTE — Telephone Encounter (Signed)
Pt notified and verbal understanding of lab results.

## 2017-02-01 NOTE — Telephone Encounter (Signed)
Left message for pt to return call regarding lab results.  

## 2017-02-07 ENCOUNTER — Ambulatory Visit (INDEPENDENT_AMBULATORY_CARE_PROVIDER_SITE_OTHER): Payer: Medicare Other | Admitting: Obstetrics and Gynecology

## 2017-02-07 ENCOUNTER — Encounter: Payer: Self-pay | Admitting: Obstetrics and Gynecology

## 2017-02-07 VITALS — BP 141/76 | HR 82 | Ht 60.5 in | Wt 132.0 lb

## 2017-02-07 DIAGNOSIS — N362 Urethral caruncle: Secondary | ICD-10-CM

## 2017-02-07 DIAGNOSIS — N952 Postmenopausal atrophic vaginitis: Secondary | ICD-10-CM

## 2017-02-07 DIAGNOSIS — N8110 Cystocele, unspecified: Secondary | ICD-10-CM

## 2017-02-07 DIAGNOSIS — N920 Excessive and frequent menstruation with regular cycle: Secondary | ICD-10-CM

## 2017-02-07 DIAGNOSIS — Z4689 Encounter for fitting and adjustment of other specified devices: Secondary | ICD-10-CM | POA: Diagnosis not present

## 2017-02-07 DIAGNOSIS — N939 Abnormal uterine and vaginal bleeding, unspecified: Secondary | ICD-10-CM

## 2017-02-07 DIAGNOSIS — N812 Incomplete uterovaginal prolapse: Secondary | ICD-10-CM | POA: Diagnosis not present

## 2017-02-07 NOTE — Progress Notes (Signed)
    GYNECOLOGY PROGRESS NOTE  Subjective:    Patient ID: Elizabeth [name], female    DOB: 09/12/1927, 83 y.o.   MRN: [id number]  HPI  Patient is a 83 y.o. 412P2002 female who presents for pessary check.  She denies pelvic discomfort, difficulty urinating.  Still noting a few episodes of light spotting. Patient notes that her husband is doing somewhat better, is being moved from hospice.  Notes that he is still requiring steady care but she is not as stressed as she was.     The following portions of the patient's history were reviewed and updated as appropriate: allergies, current medications, past family history, past medical history, past social history, past surgical history and problem list.   Review of Systems Pertinent items noted in HPI and remainder of comprehensive ROS otherwise negative.   Objective:    Blood pressure (!) 141/76, pulse 82, height 5' 0.5" (1.537 m), weight 132 lb (59.9 kg). General appearance: alert and no distress  Abdomen: soft, non-tender. No organomegaly or masses.  Pelvis: The patient's  Size 2 3/4 Gelhorn pessary was removed, cleaned, and reinserted without difficulty. Speculum examination revealed normal but atrophic vaginal mucosa with urethral caruncle. Vagina with small amount of thin yellowish watery fluid. Cervix  with small area of mucosal irritation noted at 12 o'clock. Left vaginal wall with small area of mucosal irritation as well. No erosion present.  Extremities: varicose veins appreciated, non-tender, no edema or erythema. Neurologic: grossly normal  Assessment:   Pessary maintenance Vaginal atrophy Cystocele grade 3, Uterine prolapse Grade 1 Urethral caruncle Vaginal spotting.   Plan:   - Pessary cleaned today and replaced without difficulty.   - Patient to continue to use Estrace internally twice weekly.  Notes that she occasionally uses the Trimo-san gel when she remembers.   - RTC in 8 weeks for next pessary check.       Hildred LaserAnika  Kentravious Lipford, MD [facility]

## 2017-02-10 ENCOUNTER — Encounter: Payer: Self-pay | Admitting: Internal Medicine

## 2017-02-10 NOTE — Assessment & Plan Note (Signed)
Increased stress.  Some better.  Handling things relatively well.  Follow.

## 2017-02-10 NOTE — Assessment & Plan Note (Signed)
Recheck potassium today. 

## 2017-02-10 NOTE — Assessment & Plan Note (Signed)
Sees gyn.   

## 2017-02-10 NOTE — Assessment & Plan Note (Signed)
On lipitor.  Low cholesterol diet and exercise.  Follow lipid panel and liver function tests.   

## 2017-02-10 NOTE — Assessment & Plan Note (Signed)
Blood pressure under good control.  Continue same medication regimen.  Follow pressures.  Follow metabolic panel.  Needs name brand accupril.  Has tried generic.  Did not control blood pressure.

## 2017-02-11 ENCOUNTER — Telehealth: Payer: Self-pay

## 2017-02-11 NOTE — Telephone Encounter (Signed)
Fax received for refill on Quinapril 40mg  [facility]. Labs were done yesterday is it ok to refill?

## 2017-02-11 NOTE — Telephone Encounter (Signed)
Need to confirm with pt.  She is on name brand accupril and wants to remain on the name brand.  She has tried the generic for accupril and this did not control her blood pressure.  Please clarify with pt and then call in brand name accupril.

## 2017-02-12 ENCOUNTER — Encounter: Payer: Self-pay | Admitting: Internal Medicine

## 2017-02-12 MED ORDER — ACCUPRIL 40 MG PO TABS: 40.0000 mg | ORAL_TABLET | Freq: Every day | ORAL | 11 refills | Status: DC

## 2017-02-12 NOTE — Telephone Encounter (Signed)
Spoke with patient she does use brand name I have faxed to pharmacy.

## 2017-02-13 ENCOUNTER — Other Ambulatory Visit: Payer: Self-pay

## 2017-02-13 MED ORDER — ACCUPRIL 40 MG PO TABS: 40.0000 mg | ORAL_TABLET | Freq: Every day | ORAL | 11 refills | Status: DC

## 2017-03-26 ENCOUNTER — Telehealth: Payer: Self-pay

## 2017-03-26 NOTE — Telephone Encounter (Signed)
Fax received to change Accuprill  to Quinapril . Received from [facility]. Is this ok?

## 2017-03-26 NOTE — Telephone Encounter (Signed)
No.  Pt has tried generic and does not work for her and she does not tolerate.  She wants to keep name brand accupril.  I thought we just did a PA on this.

## 2017-03-26 NOTE — Telephone Encounter (Signed)
Faxed to let them know

## 2017-04-04 ENCOUNTER — Ambulatory Visit (INDEPENDENT_AMBULATORY_CARE_PROVIDER_SITE_OTHER): Payer: Medicare Other | Admitting: Obstetrics and Gynecology

## 2017-04-04 VITALS — BP 130/77 | HR 94 | Ht 60.5 in | Wt 132.4 lb

## 2017-04-04 DIAGNOSIS — N362 Urethral caruncle: Secondary | ICD-10-CM | POA: Diagnosis not present

## 2017-04-04 DIAGNOSIS — K640 First degree hemorrhoids: Secondary | ICD-10-CM

## 2017-04-04 DIAGNOSIS — N812 Incomplete uterovaginal prolapse: Secondary | ICD-10-CM | POA: Diagnosis not present

## 2017-04-04 DIAGNOSIS — Z4689 Encounter for fitting and adjustment of other specified devices: Secondary | ICD-10-CM

## 2017-04-04 DIAGNOSIS — N8110 Cystocele, unspecified: Secondary | ICD-10-CM

## 2017-04-04 DIAGNOSIS — N952 Postmenopausal atrophic vaginitis: Secondary | ICD-10-CM

## 2017-04-04 MED ORDER — ESTRADIOL 0.1 MG/GM VA CREA: 1.0000 | TOPICAL_CREAM | VAGINAL | 2 refills | Status: DC

## 2017-04-04 NOTE — Progress Notes (Signed)
    GYNECOLOGY PROGRESS NOTE  Subjective:    Patient ID: Elizabeth [name], female    DOB: [birth year], 83 y.o.   MRN: [id number]  HPI  Patient is a 83 y.o. G68P2002 female who presents for pessary check.  She denies pelvic discomfort, difficulty urinating.  Notes spotting, but unsure of source (notices when wiping).  Also notes that she has felt a hemorrhoid over the past few days. States she is still sometimes forgetful of using her vaginal creams.    The following portions of the patient's history were reviewed and updated as appropriate: allergies, current medications, past family history, past medical history, past social history, past surgical history and problem list.   Review of Systems Pertinent items noted in HPI and remainder of comprehensive ROS otherwise negative.   Objective:    Blood pressure 130/77, pulse 94, height 5' 0.5" (1.537 m), weight 132 lb 6.4 oz (60.1 kg). General appearance: alert and no distress  Abdomen: soft, non-tender. No organomegaly or masses.  Pelvis: The patient's  Size 2 3/4 Gelhorn pessary was removed, cleaned, and reinserted without difficulty. Speculum examination revealed normal but atrophic vaginal mucosa with urethral caruncle. Vagina with small amount of thin yellowish watery fluid. No vaginal lesions present.  Extremities: varicose veins appreciated, non-tender, no edema or erythema. Neurologic: grossly normal  Assessment:   Pessary maintenance Vaginal atrophy Cystocele grade 3, Uterine prolapse Grade 1 Urethral caruncle Spotting of unknown source  Plan:   - Pessary cleaned today and replaced without difficulty.   - Patient to continue to use Estrace internally twice weekly.  Notes that she occasionally uses the Trimo-san gel when she remembers.  Refill given on Estrace cream.  - Spotting of unknown source (has urethral caruncle, small external hemorrhoid, and no vaginal bleeding or lesions noted on today's exam).  Advised on use of Estrace  cream more to urethral area. Also patient is trying to keep stools soft with stool softeners. Discussed use of moist wipes instead of tissue which can sometimes irritate the vaginal and rectal tissues if wiping to frequently or harshly.  - Urethral caruncle noted again today. Advised on using Estrace to the urethra daily x 2 weeks.    - RTC in 8 weeks for next pessary check.      Hildred Laser, MD [facility]

## 2017-04-23 ENCOUNTER — Telehealth: Payer: Self-pay | Admitting: Internal Medicine

## 2017-04-23 NOTE — Telephone Encounter (Signed)
Left pt message asking to call Allison back directly at [phone] to schedule AWV. Thanks! °

## 2017-05-21 ENCOUNTER — Other Ambulatory Visit: Payer: Self-pay

## 2017-05-21 MED ORDER — ACCUPRIL 40 MG PO TABS: 40.0000 mg | ORAL_TABLET | Freq: Every day | ORAL | 11 refills | Status: DC

## 2017-05-24 NOTE — Telephone Encounter (Signed)
Scheduled 05/27/17 for AWV

## 2017-05-24 NOTE — Telephone Encounter (Signed)
Scheduled 05/27/17 °

## 2017-05-27 ENCOUNTER — Ambulatory Visit (INDEPENDENT_AMBULATORY_CARE_PROVIDER_SITE_OTHER): Payer: Medicare Other

## 2017-05-27 ENCOUNTER — Other Ambulatory Visit (INDEPENDENT_AMBULATORY_CARE_PROVIDER_SITE_OTHER): Payer: Medicare Other

## 2017-05-27 VITALS — BP 130/70 | HR 81 | Temp 98.1°F | Resp 14 | Ht 61.0 in | Wt 133.8 lb

## 2017-05-27 DIAGNOSIS — E7800 Pure hypercholesterolemia, unspecified: Secondary | ICD-10-CM | POA: Diagnosis not present

## 2017-05-27 DIAGNOSIS — Z0000 Encounter for general adult medical examination without abnormal findings: Secondary | ICD-10-CM | POA: Diagnosis not present

## 2017-05-27 DIAGNOSIS — I10 Essential (primary) hypertension: Secondary | ICD-10-CM | POA: Diagnosis not present

## 2017-05-27 LAB — HEPATIC FUNCTION PANEL
ALT: 15 U/L (ref 0–35)
AST: 16 U/L (ref 0–37)
Albumin: 4.4 g/dL (ref 3.5–5.2)
Alkaline Phosphatase: 61 U/L (ref 39–117)
BILIRUBIN DIRECT: 0.1 mg/dL (ref 0.0–0.3)
BILIRUBIN TOTAL: 0.4 mg/dL (ref 0.2–1.2)
Total Protein: 6.9 g/dL (ref 6.0–8.3)

## 2017-05-27 LAB — BASIC METABOLIC PANEL
BUN: 20 mg/dL (ref 6–23)
CHLORIDE: 106 meq/L (ref 96–112)
CO2: 25 meq/L (ref 19–32)
Calcium: 9.2 mg/dL (ref 8.4–10.5)
Creatinine, Ser: 0.99 mg/dL (ref 0.40–1.20)
GFR: 56.22 mL/min — ABNORMAL LOW (ref >60.00)
GLUCOSE: 100 mg/dL — AB (ref 70–99)
Potassium: 5 mEq/L (ref 3.5–5.1)
SODIUM: 138 meq/L (ref 135–145)

## 2017-05-27 LAB — LIPID PANEL
CHOLESTEROL: 195 mg/dL (ref 0–200)
HDL: 55.9 mg/dL (ref >39.00)
LDL CALC: 119 mg/dL — AB (ref 0–99)
NONHDL: 139.25
Total CHOL/HDL Ratio: 3
Triglycerides: 103 mg/dL (ref 0.0–149.0)
VLDL: 20.6 mg/dL (ref 0.0–40.0)

## 2017-05-27 NOTE — Patient Instructions (Addendum)
  [name] , Thank you for taking time to come for your Medicare Wellness Visit. I appreciate your ongoing commitment to your health goals. Please review the following plan we discussed and let me know if I can assist you in the future.   Follow up with Dr. Lorin PicketScott as needed.    Bring a copy of your Health Care Power of Attorney and/or Living Will to be scanned into chart.  Have a great day!  These are the goals we discussed: Goals    . Increase physical activity          Use the treadmill as tolerated    . Increase water intake          Stay hydrated       This is a list of the screening recommended for you and due dates:  Health Maintenance  Topic Date Due  . Tetanus Vaccine  01/27/1948  . Pneumonia vaccines (2 of 2 - PPSV23) 03/12/2015  . Mammogram  06/22/2018*  . DEXA scan (bone density measurement)  06/22/2018*  . Flu Shot  07/24/2017  *Topic was postponed. The date shown is not the original due date.

## 2017-05-27 NOTE — Progress Notes (Addendum)
Subjective:   Elizabeth [name] is a 83 y.o. female who presents for an Initial Medicare Annual Wellness Visit.  Review of Systems    No ROS.  Medicare Wellness Visit.  Cardiac Risk Factors include: advanced age (>76men, >26 women);hypertension     Objective:    Today's Vitals   05/27/17 0814  BP: 130/70  Pulse: 81  Resp: 14  Temp: 98.1 F (36.7 C)  TempSrc: Oral  SpO2: 98%  Weight: 133 lb 12.8 oz (60.7 kg)  Height: 5\' 1"  (1.549 m)   Body mass index is 25.28 kg/m.   Current Medications (verified) Outpatient Encounter Prescriptions as of 05/27/2017  Medication Sig  . ACCUPRIL 40 MG tablet Take 1 tablet (40 mg total) by mouth daily.  Marland Kitchen aspirin 81 MG tablet Take 81 mg by mouth daily.  . cetirizine (ZYRTEC) 10 MG tablet Take 10 mg by mouth daily as needed.   . Cholecalciferol (VITAMIN D-3) 1000 UNITS CAPS Take by mouth.  . estradiol (ESTRACE VAGINAL) 0.1 MG/GM vaginal cream Place 1 Applicatorful vaginally 2 (two) times a week.  . hydrALAZINE (APRESOLINE) 25 MG tablet Take 1 tablet (25 mg total) by mouth 3 (three) times daily as needed.  Marland Kitchen LIPITOR 40 MG tablet Take 1 tablet (40 mg total) by mouth daily.  . propranolol (INDERAL) 10 MG tablet Take 1 tablet (10 mg total) by mouth 3 (three) times daily as needed.  . triamcinolone (NASACORT) 55 MCG/ACT nasal inhaler Place 2 sprays into the nose daily as needed.    No facility-administered encounter medications on file as of 05/27/2017.     Allergies (verified) Patient has no known allergies.   History: Past Medical History:  Diagnosis Date  . Cystocele or rectocele with incomplete uterine prolapse   . Hyperlipidemia   . Hypertension   . Irritable bowel syndrome   . Vaginal atrophy    Past Surgical History:  Procedure Laterality Date  . CATARACT EXTRACTION  2006   Family History  Problem Relation Age of Onset  . Transient ischemic attack Mother        multiple   . Dementia Mother   . Stroke Father   . Heart disease  Brother        MI - died age 8  . Leukemia Brother   . Lung cancer Brother   . Throat cancer Brother        had heart disease also  . Heart disease Sister   . Dementia Sister    Social History   Occupational History  . Not on file.   Social History Main Topics  . Smoking status: Never Smoker  . Smokeless tobacco: Never Used  . Alcohol use No  . Drug use: No  . Sexual activity: Not Currently    Birth control/ protection: None    Tobacco Counseling Counseling given: Not Answered   Activities of Daily Living In your present state of health, do you have any difficulty performing the following activities: 05/27/2017  Hearing? Y  Vision? N  Difficulty concentrating or making decisions? N  Walking or climbing stairs? Y  Dressing or bathing? N  Doing errands, shopping? N  Preparing Food and eating ? N  Using the Toilet? N  In the past six months, have you accidently leaked urine? N  Do you have problems with loss of bowel control? N  Managing your Medications? N  Managing your Finances? N  Housekeeping or managing your Housekeeping? N  Some recent data might be  hidden    Immunizations and Health Maintenance Immunization History  Administered Date(s) Administered  . Influenza Split 10/19/2009, 10/13/2013, 09/30/2014  . Influenza, High Dose Seasonal PF 09/28/2016  . Influenza-Unspecified 10/24/2015  . Pneumococcal Conjugate-13 03/11/2014   Health Maintenance Due  Topic Date Due  . TETANUS/TDAP  01/27/1948  . PNA vac Low Risk Adult (2 of 2 - PPSV23) 03/12/2015    Patient Care Team: Dale Man[place], MD as PCP - General (Internal Medicine)  Indicate any recent Medical Services you may have received from other than [facility] providers in the past year (date may be approximate).     Assessment:   This is a routine wellness examination for [name]. The goal of the wellness visit is to assist the patient how to close the gaps in care and create a preventative care plan for  the patient.   Taking calcium VIT D3 as appropriate/Osteoporosis risk reviewed.  Medications reviewed; taking without issues or barriers.  Safety issues reviewed; smoke detectors in the home. No firearms in the home.  Wears seatbelts when driving or riding with others. Patient does wear sunscreen or protective clothing when in direct sunlight. No violence in the home.  Depression- PHQ 2 &9 complete.  No signs/symptoms or verbal communication regarding little pleasure in doing things, feeling down, depressed or hopeless. No changes in sleeping, energy, eating, concentrating.  No thoughts of self harm or harm towards others.  Time spent on this topic is 8 minutes.   Patient is alert, normal appearance, oriented to person/place/and time. Correctly identified the president of the [place], recall of 3/3 words, and performing simple calculations.  Patient displays appropriate judgement and can read correct time from watch face.  No new identified risk were noted.  No failures at ADL's or IADL's.   BMI- discussed the importance of a healthy diet, water intake and exercise. Educational material provided.   Daily fluid intake: 1 cups of caffeine, 5 cups of water  HTN- followed by PCP.  Dental- every six months. Dr. Su Hilt.  Sleep patterns- Sleeps 8 hours at night when husband is able to rest.    Pneumovax 23 and TDAP vaccine deferred per patient preference.  Follow up with insurance.  Educational material provided.  Mammogram and Bone density discussed.  Postponed per patient request.  Patient Concerns: None at this time. Follow up with PCP as needed.  Hearing/Vision screen Hearing Screening Comments: Followed by [facility]  Hearing aid, bilateral Vision Screening Comments: Followed by [facility] and Dr. Clydene Pugh Cheree Ditto) Wears corrective lenses Last OV 2017 Cataract extraction, bilateral Visual acuity not assessed per patient preference since they have regular  follow up with the ophthalmologist  Dietary issues and exercise activities discussed: Current Exercise Habits: The patient does not participate in regular exercise at present  Goals    . Increase physical activity          Use the treadmill as tolerated    . Increase water intake          Stay hydrated      Depression Screen PHQ 2/9 Scores 05/27/2017 09/28/2016 07/19/2015 07/14/2014 03/11/2014 01/09/2013 12/09/2012  PHQ - 2 Score 0 0 0 0 0 0 0    Fall Risk Fall Risk  05/27/2017 09/28/2016 07/19/2015 07/14/2014 03/11/2014  Falls in the past year? No No No No No    Cognitive Function:     6CIT Screen 05/27/2017  What Year? 0 points  What month? 0 points  What time? 0  points  Count back from 20 0 points  Months in reverse 0 points  Repeat phrase 0 points  Total Score 0    Screening Tests Health Maintenance  Topic Date Due  . TETANUS/TDAP  01/27/1948  . PNA vac Low Risk Adult (2 of 2 - PPSV23) 03/12/2015  . MAMMOGRAM  06/22/2018 (Originally 10/23/2013)  . DEXA SCAN  06/22/2018 (Originally 01/26/1994)  . INFLUENZA VACCINE  07/24/2017      Plan:   End of life planning; Advanced aging; Advanced directives discussed.  No HCPOA/Living Will.  Additional information provided to help them start the conversation with family.  Copy of HCPOA/Living Will requested upon completion. Time spent on this topic is 17 minutes.  I have personally reviewed and noted the following in the patient's chart:   . Medical and social history . Use of alcohol, tobacco or illicit drugs  . Current medications and supplements . Functional ability and status . Nutritional status . Physical activity . Advanced directives . List of other physicians . Hospitalizations, surgeries, and ER visits in previous 12 months . Vitals . Screenings to include cognitive, depression, and falls . Referrals and appointments  In addition, I have reviewed and discussed with patient certain preventive protocols, quality  metrics, and best practice recommendations. A written personalized care plan for preventive services as well as general preventive health recommendations were provided to patient.     Ashok PallOBrien-Blaney, Belenda Alviar L, LPN   5/7/84696/03/2017    Reviewed above information.  Agree with plan.  Dr Lorin PicketScott

## 2017-05-28 ENCOUNTER — Encounter: Payer: Self-pay | Admitting: Internal Medicine

## 2017-05-30 ENCOUNTER — Encounter: Payer: Self-pay | Admitting: Obstetrics and Gynecology

## 2017-05-30 ENCOUNTER — Ambulatory Visit (INDEPENDENT_AMBULATORY_CARE_PROVIDER_SITE_OTHER): Payer: Medicare Other | Admitting: Obstetrics and Gynecology

## 2017-05-30 ENCOUNTER — Ambulatory Visit: Payer: Medicare Other | Admitting: Internal Medicine

## 2017-05-30 VITALS — BP 118/70 | HR 81 | Ht 60.5 in | Wt 134.9 lb

## 2017-05-30 DIAGNOSIS — N362 Urethral caruncle: Secondary | ICD-10-CM

## 2017-05-30 DIAGNOSIS — N814 Uterovaginal prolapse, unspecified: Secondary | ICD-10-CM

## 2017-05-30 DIAGNOSIS — Z4689 Encounter for fitting and adjustment of other specified devices: Secondary | ICD-10-CM | POA: Diagnosis not present

## 2017-05-30 DIAGNOSIS — N952 Postmenopausal atrophic vaginitis: Secondary | ICD-10-CM

## 2017-05-30 NOTE — Progress Notes (Signed)
    GYNECOLOGY PROGRESS NOTE  Subjective:    Patient ID: Matilde HaymakerColleen C Anderle, female    DOB: 01/22/1927, 83 y.o.   MRN: [id number]  HPI  Patient is a 83 y.o. 152P2002 female who presents for pessary check.  She denies pelvic discomfort, difficulty urinating or vaginal bleeding. Is doing better about using her vaginal cream for her vaginal atrophy.    The following portions of the patient's history were reviewed and updated as appropriate: allergies, current medications, past family history, past medical history, past social history, past surgical history and problem list.   Review of Systems Pertinent items noted in HPI and remainder of comprehensive ROS otherwise negative.   Objective:    Blood pressure 118/70, pulse 81, height 5' 0.5" (1.537 m), weight 134 lb 14.4 oz (61.2 kg). General appearance: alert and no distress  Abdomen: soft, non-tender. No organomegaly or masses.  Pelvis: The patient's  Size 2 3/4 Gelhorn pessary was removed, cleaned, and reinserted without difficulty. Speculum examination revealed normal but mildly atrophic vaginal mucosa with urethral caruncle improvement since last visit). Vagina with small amount of thin yellowish watery fluid. No vaginal lesions present.    Assessment:   Pessary maintenance Vaginal atrophy Cystocele grade 3, Uterine prolapse Grade 1 Urethral caruncle  Plan:   - Pessary cleaned today and replaced without difficulty.   - Patient to continue to use Estrace internally twice weekly.  Can use the Trimo-san gel when she needed.    - RTC in 8 weeks for next pessary check.      Hildred Laserherry, Roanna Reaves, MD [facility]

## 2017-05-31 ENCOUNTER — Ambulatory Visit (INDEPENDENT_AMBULATORY_CARE_PROVIDER_SITE_OTHER): Payer: Medicare Other | Admitting: Internal Medicine

## 2017-05-31 ENCOUNTER — Encounter: Payer: Self-pay | Admitting: Internal Medicine

## 2017-05-31 DIAGNOSIS — I499 Cardiac arrhythmia, unspecified: Secondary | ICD-10-CM | POA: Diagnosis not present

## 2017-05-31 DIAGNOSIS — N812 Incomplete uterovaginal prolapse: Secondary | ICD-10-CM | POA: Diagnosis not present

## 2017-05-31 DIAGNOSIS — E7800 Pure hypercholesterolemia, unspecified: Secondary | ICD-10-CM | POA: Diagnosis not present

## 2017-05-31 DIAGNOSIS — E875 Hyperkalemia: Secondary | ICD-10-CM

## 2017-05-31 DIAGNOSIS — R42 Dizziness and giddiness: Secondary | ICD-10-CM

## 2017-05-31 DIAGNOSIS — Z23 Encounter for immunization: Secondary | ICD-10-CM

## 2017-05-31 DIAGNOSIS — F439 Reaction to severe stress, unspecified: Secondary | ICD-10-CM

## 2017-05-31 DIAGNOSIS — I10 Essential (primary) hypertension: Secondary | ICD-10-CM

## 2017-05-31 DIAGNOSIS — H9190 Unspecified hearing loss, unspecified ear: Secondary | ICD-10-CM

## 2017-05-31 NOTE — Progress Notes (Signed)
Patient ID: Elizabeth [name], female   DOB: Jun 27, 1927, 83 y.o.   MRN: [id number]   Subjective:    Patient ID: Elizabeth [name], female    DOB: 1927-12-06, 83 y.o.   MRN: [id number]  HPI  Patient here for a scheduled follow up.  She just saw Dr Valentino Saxon yesterday for pessary change.  Has f/u planned in 8 weeks.  Still with increased stress.  Taking care of her husband.  Overall she feels she is handling things relatively well.  No chest pain.  No sob.  No acid reflux.  No abdominal pain.  Bowels moving.  GI symptoms better.  More regular bowel movements.  She still has some intermittent dizziness.  Can hear her heart beat in her ears intermittently.  States blood pressure has been under reasonable control.     Past Medical History:  Diagnosis Date  . Cystocele or rectocele with incomplete uterine prolapse   . Hyperlipidemia   . Hypertension   . Irritable bowel syndrome   . Vaginal atrophy    Past Surgical History:  Procedure Laterality Date  . CATARACT EXTRACTION  2006   Family History  Problem Relation Age of Onset  . Transient ischemic attack Mother        multiple   . Dementia Mother   . Stroke Father   . Heart disease Brother        MI - died age 74  . Leukemia Brother   . Lung cancer Brother   . Throat cancer Brother        had heart disease also  . Heart disease Sister   . Dementia Sister    Social History   Social History  . Marital status: Married    Spouse name: N/A  . Number of children: 2  . Years of education: N/A   Social History Main Topics  . Smoking status: Never Smoker  . Smokeless tobacco: Never Used  . Alcohol use No  . Drug use: No  . Sexual activity: Not Currently    Birth control/ protection: None   Other Topics Concern  . None   Social History Narrative  . None    Outpatient Encounter Prescriptions as of 05/31/2017  Medication Sig  . ACCUPRIL 40 MG tablet Take 1 tablet (40 mg total) by mouth daily.  Marland Kitchen aspirin 81 MG tablet Take 81 mg by mouth  daily.  . cetirizine (ZYRTEC) 10 MG tablet Take 10 mg by mouth daily as needed.   . Cholecalciferol (VITAMIN D-3) 1000 UNITS CAPS Take by mouth.  . estradiol (ESTRACE VAGINAL) 0.1 MG/GM vaginal cream Place 1 Applicatorful vaginally 2 (two) times a week.  . hydrALAZINE (APRESOLINE) 25 MG tablet Take 1 tablet (25 mg total) by mouth 3 (three) times daily as needed.  Marland Kitchen LIPITOR 40 MG tablet Take 1 tablet (40 mg total) by mouth daily.  . propranolol (INDERAL) 10 MG tablet Take 1 tablet (10 mg total) by mouth 3 (three) times daily as needed.  . triamcinolone (NASACORT) 55 MCG/ACT nasal inhaler Place 2 sprays into the nose daily as needed.    No facility-administered encounter medications on file as of 05/31/2017.     Review of Systems  Constitutional: Negative for appetite change and unexpected weight change.  HENT: Negative for congestion.   Respiratory: Negative for cough, chest tightness and shortness of breath.   Cardiovascular: Negative for chest pain, palpitations and leg swelling.  Gastrointestinal: Negative for abdominal pain, diarrhea, nausea and vomiting.  Genitourinary:  Negative for difficulty urinating and dysuria.  Musculoskeletal: Negative for back pain and joint swelling.  Skin: Negative for color change and rash.  Neurological: Negative for dizziness, light-headedness and headaches.  Psychiatric/Behavioral: Negative for agitation and dysphoric mood.       Objective:    Physical Exam  Constitutional: She appears well-developed and well-nourished. No distress.  HENT:  Nose: Nose normal.  Mouth/Throat: Oropharynx is clear and moist.  Neck: Neck supple. No thyromegaly present.  Cardiovascular: Normal rate and regular rhythm.   Pulmonary/Chest: Breath sounds normal. No respiratory distress. She has no wheezes.  Abdominal: Soft. Bowel sounds are normal. There is no tenderness.  Musculoskeletal: She exhibits no edema or tenderness.  Lymphadenopathy:    She has no cervical  adenopathy.  Skin: No rash noted. No erythema.  Psychiatric: She has a normal mood and affect. Her behavior is normal.    BP 130/68 (BP Location: Left Arm, Patient Position: Sitting, Cuff Size: Normal)   Pulse 76   Temp 98.6 F (37 C) (Oral)   Resp 12   Ht 5\' 1"  (1.549 m)   Wt 133 lb 12.8 oz (60.7 kg)   SpO2 98%   BMI 25.28 kg/m  Wt Readings from Last 3 Encounters:  05/31/17 133 lb 12.8 oz (60.7 kg)  05/30/17 134 lb 14.4 oz (61.2 kg)  05/27/17 133 lb 12.8 oz (60.7 kg)     Lab Results  Component Value Date   WBC 6.9 01/25/2017   HGB 13.9 01/25/2017   HCT 41.9 01/25/2017   PLT 258.0 01/25/2017   GLUCOSE 100 (H) 05/27/2017   CHOL 195 05/27/2017   TRIG 103.0 05/27/2017   HDL 55.90 05/27/2017   LDLDIRECT 178.4 11/04/2013   LDLCALC 119 (H) 05/27/2017   ALT 15 05/27/2017   AST 16 05/27/2017   NA 138 05/27/2017   K 5.0 05/27/2017   CL 106 05/27/2017   CREATININE 0.99 05/27/2017   BUN 20 05/27/2017   CO2 25 05/27/2017   TSH 4.00 01/25/2017       Assessment & Plan:   Problem List Items Addressed This Visit    Arrhythmia    Saw Dr Mariah Milling.  Palpitations better.  Felt to be related to pvc's/pac's.  Doing well.  Follow.        Decreased hearing    Wearing hearing aids.  Follow.       Dizziness    Intermittently occurs.  Can intermittent hear her heart beat in her ear.  Check carotid ultrasound (pt request).        Relevant Orders   VAS US CAROTID   First degree uterine prolapse    Seeing gyn.  Pessary in place.        Hypercholesterolemia    Cholesterol increased some when compared to the previous check.  She has not been taking her lipitor regularly.  Will start.  Follow lipid panel and liver function tests.        Relevant Orders   Hepatic function panel   Lipid panel   Hyperkalemia    On rechecks is wnl.  Recent potassium check wnl.  Follow.        Hypertension    Blood pressure under good control.  Continue same medication regimen.  Follow  pressures.  Follow metabolic panel.        Relevant Orders   Basic metabolic panel   Stress    Increased stress as outlined.  Discussed with her today.  She feels she is handling things  relatively well.  Does not feel needs any further intervention.  Follow.            Dale DurhamSCOTT, Jacy Howat, MD

## 2017-05-31 NOTE — Progress Notes (Signed)
Pre-visit discussion using our clinic review tool. No additional management support is needed unless otherwise documented below in the visit note.  

## 2017-06-02 ENCOUNTER — Encounter: Payer: Self-pay | Admitting: Internal Medicine

## 2017-06-02 DIAGNOSIS — R42 Dizziness and giddiness: Secondary | ICD-10-CM | POA: Insufficient documentation

## 2017-06-02 NOTE — Assessment & Plan Note (Signed)
Intermittently occurs.  Can intermittent hear her heart beat in her ear.  Check carotid ultrasound (pt request).

## 2017-06-02 NOTE — Assessment & Plan Note (Signed)
Seeing gyn.  Pessary in place.

## 2017-06-02 NOTE — Assessment & Plan Note (Signed)
On rechecks is wnl.  Recent potassium check wnl.  Follow.

## 2017-06-02 NOTE — Assessment & Plan Note (Signed)
Cholesterol increased some when compared to the previous check.  She has not been taking her lipitor regularly.  Will start.  Follow lipid panel and liver function tests.

## 2017-06-02 NOTE — Assessment & Plan Note (Signed)
Wearing hearing aids.  Follow.   

## 2017-06-02 NOTE — Assessment & Plan Note (Signed)
Blood pressure under good control.  Continue same medication regimen.  Follow pressures.  Follow metabolic panel.   

## 2017-06-02 NOTE — Assessment & Plan Note (Signed)
Saw Dr Mariah MillingGollan.  Palpitations better.  Felt to be related to pvc's/pac's.  Doing well.  Follow.

## 2017-06-02 NOTE — Assessment & Plan Note (Signed)
Increased stress as outlined.  Discussed with her today.  She feels she is handling things relatively well. Does not feel needs any further intervention.  Follow.   

## 2017-06-14 ENCOUNTER — Other Ambulatory Visit: Payer: Self-pay

## 2017-06-14 DIAGNOSIS — N952 Postmenopausal atrophic vaginitis: Secondary | ICD-10-CM

## 2017-06-14 MED ORDER — ESTRADIOL 0.1 MG/GM VA CREA: 1.0000 | TOPICAL_CREAM | VAGINAL | 2 refills | Status: DC

## 2017-06-17 ENCOUNTER — Other Ambulatory Visit: Payer: Self-pay

## 2017-06-17 MED ORDER — QUINAPRIL HCL 40 MG PO TABS: 40.0000 mg | ORAL_TABLET | Freq: Every day | ORAL | 1 refills | Status: DC

## 2017-06-18 ENCOUNTER — Ambulatory Visit (INDEPENDENT_AMBULATORY_CARE_PROVIDER_SITE_OTHER): Payer: Medicare Other

## 2017-06-18 DIAGNOSIS — R42 Dizziness and giddiness: Secondary | ICD-10-CM | POA: Diagnosis not present

## 2017-07-16 ENCOUNTER — Telehealth: Payer: Self-pay

## 2017-07-16 NOTE — Telephone Encounter (Signed)
auth request received from [facility]. Submitted on cover my meds  Key: [id number]  Approved from 06/16/17 to 07/16/18  Case ID [id number]

## 2017-07-25 ENCOUNTER — Ambulatory Visit (INDEPENDENT_AMBULATORY_CARE_PROVIDER_SITE_OTHER): Payer: Medicare Other | Admitting: Obstetrics and Gynecology

## 2017-07-25 ENCOUNTER — Encounter: Payer: Self-pay | Admitting: Obstetrics and Gynecology

## 2017-07-25 ENCOUNTER — Other Ambulatory Visit: Payer: Self-pay | Admitting: Internal Medicine

## 2017-07-25 VITALS — BP 127/66 | HR 75 | Ht 61.0 in | Wt 133.9 lb

## 2017-07-25 DIAGNOSIS — N812 Incomplete uterovaginal prolapse: Secondary | ICD-10-CM

## 2017-07-25 DIAGNOSIS — Z960 Presence of urogenital implants: Secondary | ICD-10-CM

## 2017-07-25 DIAGNOSIS — Z9289 Personal history of other medical treatment: Secondary | ICD-10-CM

## 2017-07-25 DIAGNOSIS — N952 Postmenopausal atrophic vaginitis: Secondary | ICD-10-CM | POA: Diagnosis not present

## 2017-07-25 DIAGNOSIS — N8110 Cystocele, unspecified: Secondary | ICD-10-CM | POA: Diagnosis not present

## 2017-07-25 NOTE — Progress Notes (Signed)
    GYNECOLOGY PROGRESS NOTE  Subjective:    Patient ID: Elizabeth [name], female    DOB: 10/03/1927, 83 y.o.   MRN: [id number]  HPI  Patient is a 83 y.o. 172P2002 female who presents for pessary check.  She denies pelvic discomfort, difficulty urinating.  Reports occasional vaginal spotting.  Still noting thin vaginal discharge.    The following portions of the patient's history were reviewed and updated as appropriate: allergies, current medications, past family history, past medical history, past social history, past surgical history and problem list.   Review of Systems Pertinent items noted in HPI and remainder of comprehensive ROS otherwise negative.   Objective:    Blood pressure 127/66, pulse 75, height 5\' 1"  (1.549 m), weight 133 lb 14.4 oz (60.7 kg). General appearance: alert and no distress  Abdomen: soft, non-tender. No organomegaly or masses.  Pelvis: The patient's  Size 2 3/4 Gelhorn pessary was removed, cleaned, and reinserted without difficulty. Speculum examination revealed normal but mildly atrophic vaginal mucosa with urethral caruncle improvement since last visit). Vagina with small amount of thin yellowish watery fluid. No vaginal lesions present.    Assessment:   Pessary maintenance Vaginal atrophy Cystocele grade 3, Uterine prolapse Grade 1  Plan:   - Pessary cleaned today and replaced without difficulty.   - Continue to use Estrace internally twice weekly.  Can use the Trimo-san gel when she needed.   - RTC in 8 weeks for next pessary check.      Elizabeth [name], Elizabeth Graffam, MD [facility]

## 2017-07-26 ENCOUNTER — Encounter: Payer: Medicare Other | Admitting: Obstetrics and Gynecology

## 2017-08-27 ENCOUNTER — Telehealth: Payer: Self-pay

## 2017-08-27 NOTE — Telephone Encounter (Signed)
Auth started for Lipitor 40mg  on cover my meds online Key [id number] Pending

## 2017-09-19 ENCOUNTER — Encounter: Payer: Medicare Other | Admitting: Obstetrics and Gynecology

## 2017-09-20 ENCOUNTER — Ambulatory Visit (INDEPENDENT_AMBULATORY_CARE_PROVIDER_SITE_OTHER): Payer: Medicare Other | Admitting: Obstetrics and Gynecology

## 2017-09-20 ENCOUNTER — Encounter: Payer: Self-pay | Admitting: Obstetrics and Gynecology

## 2017-09-20 VITALS — BP 130/75 | HR 79 | Ht 61.0 in | Wt 132.1 lb

## 2017-09-20 DIAGNOSIS — Z4689 Encounter for fitting and adjustment of other specified devices: Secondary | ICD-10-CM | POA: Diagnosis not present

## 2017-09-20 DIAGNOSIS — N8110 Cystocele, unspecified: Secondary | ICD-10-CM

## 2017-09-20 DIAGNOSIS — N952 Postmenopausal atrophic vaginitis: Secondary | ICD-10-CM | POA: Diagnosis not present

## 2017-09-20 DIAGNOSIS — N812 Incomplete uterovaginal prolapse: Secondary | ICD-10-CM

## 2017-09-20 NOTE — Progress Notes (Signed)
    GYNECOLOGY PROGRESS NOTE  Subjective:    Patient ID: Elizabeth [name], female    DOB: 01/11/1927, 83 y.o.   MRN: [id number]  HPI  Patient is a 83 y.o. G32P2002 female who presents for pessary check.  She denies pelvic discomfort, difficulty urinating.  Denies vaginal bleeding.  Denies difficulty moving her bowels.    The following portions of the patient's history were reviewed and updated as appropriate: allergies, current medications, past family history, past medical history, past social history, past surgical history and problem list.   Review of Systems Pertinent items noted in HPI and remainder of comprehensive ROS otherwise negative.   Objective:    Blood pressure 130/75, pulse 79, height  (1.549 m), weight 132 lb 1.6 oz (59.9 kg). General appearance: alert and no distress  Abdomen: soft, non-tender. No organomegaly or masses.  Pelvis: The patient's  Size 2 3/4 Gelhorn pessary was removed, cleaned, and reinserted without difficulty. Speculum examination revealed mildly atrophic vaginal mucosa with no lesions or lacerations.  Moderate amount of thin watery white-gray discharge, no odor.       Assessment:   Pessary maintenance Vaginal atrophy Cystocele grade 3, Uterine prolapse Grade 1  Plan:   - Pessary cleaned today and replaced without difficulty.   - Continue to use Estrace internally twice weekly.  Can use the Trimo-san gel when she needed.   - RTC in 8-10 weeks for next pessary check.      Hildred Laser, MD [facility]

## 2017-10-08 ENCOUNTER — Other Ambulatory Visit (INDEPENDENT_AMBULATORY_CARE_PROVIDER_SITE_OTHER): Payer: Medicare Other

## 2017-10-08 DIAGNOSIS — E7800 Pure hypercholesterolemia, unspecified: Secondary | ICD-10-CM

## 2017-10-08 DIAGNOSIS — I10 Essential (primary) hypertension: Secondary | ICD-10-CM | POA: Diagnosis not present

## 2017-10-08 LAB — LIPID PANEL
CHOLESTEROL: 205 mg/dL — AB (ref 0–200)
HDL: 45 mg/dL (ref >39.00)
LDL CALC: 138 mg/dL — AB (ref 0–99)
NonHDL: 159.9
Total CHOL/HDL Ratio: 5
Triglycerides: 108 mg/dL (ref 0.0–149.0)
VLDL: 21.6 mg/dL (ref 0.0–40.0)

## 2017-10-08 LAB — BASIC METABOLIC PANEL
BUN: 21 mg/dL (ref 6–23)
CALCIUM: 8.9 mg/dL (ref 8.4–10.5)
CHLORIDE: 108 meq/L (ref 96–112)
CO2: 19 meq/L (ref 19–32)
Creatinine, Ser: 1.05 mg/dL (ref 0.40–1.20)
GFR: 52.49 mL/min — ABNORMAL LOW (ref >60.00)
GLUCOSE: 90 mg/dL (ref 70–99)
POTASSIUM: 4.8 meq/L (ref 3.5–5.1)
SODIUM: 138 meq/L (ref 135–145)

## 2017-10-08 LAB — HEPATIC FUNCTION PANEL
ALBUMIN: 4 g/dL (ref 3.5–5.2)
ALT: 13 U/L (ref 0–35)
AST: 17 U/L (ref 0–37)
Alkaline Phosphatase: 54 U/L (ref 39–117)
BILIRUBIN TOTAL: 0.4 mg/dL (ref 0.2–1.2)
Bilirubin, Direct: 0.1 mg/dL (ref 0.0–0.3)
Total Protein: 6.4 g/dL (ref 6.0–8.3)

## 2017-10-09 ENCOUNTER — Encounter: Payer: Self-pay | Admitting: Internal Medicine

## 2017-10-11 ENCOUNTER — Ambulatory Visit (INDEPENDENT_AMBULATORY_CARE_PROVIDER_SITE_OTHER): Payer: Medicare Other | Admitting: Internal Medicine

## 2017-10-11 VITALS — BP 136/72 | HR 85 | Temp 98.6°F | Resp 14 | Ht 61.0 in | Wt 133.0 lb

## 2017-10-11 DIAGNOSIS — F439 Reaction to severe stress, unspecified: Secondary | ICD-10-CM

## 2017-10-11 DIAGNOSIS — Z9109 Other allergy status, other than to drugs and biological substances: Secondary | ICD-10-CM

## 2017-10-11 DIAGNOSIS — I10 Essential (primary) hypertension: Secondary | ICD-10-CM | POA: Diagnosis not present

## 2017-10-11 DIAGNOSIS — Z9289 Personal history of other medical treatment: Secondary | ICD-10-CM

## 2017-10-11 DIAGNOSIS — E7800 Pure hypercholesterolemia, unspecified: Secondary | ICD-10-CM

## 2017-10-11 DIAGNOSIS — Z0000 Encounter for general adult medical examination without abnormal findings: Secondary | ICD-10-CM

## 2017-10-11 DIAGNOSIS — Z0001 Encounter for general adult medical examination with abnormal findings: Secondary | ICD-10-CM

## 2017-10-11 DIAGNOSIS — Z960 Presence of urogenital implants: Secondary | ICD-10-CM

## 2017-10-11 NOTE — Progress Notes (Signed)
Patient ID: Elizabeth [name], female   DOB: 11/25/1927, 83 y.o.   MRN: [id number]   Subjective:    Patient ID: Elizabeth [name], female    DOB: 03/09/1927, 83 y.o.   MRN: [id number]  HPI  Patient here for her physical exam.  She reports she is doing relatively well.  Still with increased stress in dealing with her husband's medical issues.  She feels she is doing relatively well.  Does not feel she needs anything more at this time.  No chest pain.  No sob.  Did have headache, fever and chills two weeks ago.  Some congestion.  Has improved.  Now just with minimal allergy symptoms.  No sinus pressure.  No sore throat.  No cough or congestion.  No acid reflux.  No abdominal pain.  Bowels moving.     Past Medical History:  Diagnosis Date  . Cystocele or rectocele with incomplete uterine prolapse   . Hyperlipidemia   . Hypertension   . Irritable bowel syndrome   . Vaginal atrophy    Past Surgical History:  Procedure Laterality Date  . CATARACT EXTRACTION  2006   Family History  Problem Relation Age of Onset  . Transient ischemic attack Mother        multiple   . Dementia Mother   . Stroke Father   . Heart disease Brother        MI - died age 81  . Leukemia Brother   . Lung cancer Brother   . Throat cancer Brother        had heart disease also  . Heart disease Sister   . Dementia Sister    Social History   Social History  . Marital status: Married    Spouse name: N/A  . Number of children: 2  . Years of education: N/A   Social History Main Topics  . Smoking status: Never Smoker  . Smokeless tobacco: Never Used  . Alcohol use No  . Drug use: No  . Sexual activity: Not Currently    Birth control/ protection: None   Other Topics Concern  . None   Social History Narrative  . None    Outpatient Encounter Prescriptions as of 10/11/2017  Medication Sig  . aspirin 81 MG tablet Take 81 mg by mouth daily.  . cetirizine (ZYRTEC) 10 MG tablet Take 10 mg by mouth daily as needed.    . Cholecalciferol (VITAMIN D-3) 1000 UNITS CAPS Take by mouth.  . estradiol (ESTRACE VAGINAL) 0.1 MG/GM vaginal cream Place 1 Applicatorful vaginally 2 (two) times a week.  . hydrALAZINE (APRESOLINE) 25 MG tablet Take 1 tablet (25 mg total) by mouth 3 (three) times daily as needed.  Marland Kitchen. LIPITOR 40 MG tablet TAKE 1 TABLET BY MOUTH ONCE DAILY  . propranolol (INDERAL) 10 MG tablet Take 1 tablet (10 mg total) by mouth 3 (three) times daily as needed.  . quinapril (ACCUPRIL) 40 MG tablet Take 1 tablet (40 mg total) by mouth daily.  Marland Kitchen. triamcinolone (NASACORT) 55 MCG/ACT nasal inhaler Place 2 sprays into the nose daily as needed.    No facility-administered encounter medications on file as of 10/11/2017.     Review of Systems  Constitutional: Negative for appetite change, fever and unexpected weight change.  HENT: Negative for sinus pressure and sore throat.        Minimal nasal congestion.    Eyes: Negative for pain and visual disturbance.  Respiratory: Negative for cough, chest tightness and shortness of  breath.   Cardiovascular: Negative for chest pain, palpitations and leg swelling.  Gastrointestinal: Negative for abdominal pain, diarrhea, nausea and vomiting.  Genitourinary: Negative for difficulty urinating and dysuria.  Musculoskeletal: Negative for back pain and joint swelling.  Skin: Negative for color change and rash.  Neurological: Negative for dizziness, light-headedness and headaches.  Hematological: Negative for adenopathy. Does not bruise/bleed easily.  Psychiatric/Behavioral: Negative for agitation and dysphoric mood.       Objective:    Physical Exam  Constitutional: She is oriented to person, place, and time. She appears well-developed and well-nourished. No distress.  HENT:  Nose: Nose normal.  Mouth/Throat: Oropharynx is clear and moist.  Eyes: Right eye exhibits no discharge. Left eye exhibits no discharge. No scleral icterus.  Neck: Neck supple. No thyromegaly  present.  Cardiovascular: Normal rate and regular rhythm.   Pulmonary/Chest: Breath sounds normal. No accessory muscle usage. No tachypnea. No respiratory distress. She has no decreased breath sounds. She has no wheezes. She has no rhonchi. Right breast exhibits no inverted nipple, no mass, no nipple discharge and no tenderness (no axillary adenopathy). Left breast exhibits no inverted nipple, no mass, no nipple discharge and no tenderness (no axilarry adenopathy).  Abdominal: Soft. Bowel sounds are normal. There is no tenderness.  Musculoskeletal: She exhibits no edema or tenderness.  Lymphadenopathy:    She has no cervical adenopathy.  Neurological: She is alert and oriented to person, place, and time.  Skin: Skin is warm. No rash noted. No erythema.  Psychiatric: She has a normal mood and affect. Her behavior is normal.    BP 136/72 (BP Location: Left Arm, Patient Position: Sitting, Cuff Size: Normal)   Pulse 85   Temp 98.6 F (37 C) (Oral)   Resp 14   Ht 5\' 1"  (1.549 m)   Wt 133 lb (60.3 kg)   SpO2 98%   BMI 25.13 kg/m  Wt Readings from Last 3 Encounters:  10/11/17 133 lb (60.3 kg)  09/20/17 132 lb 1.6 oz (59.9 kg)  07/25/17 133 lb 14.4 oz (60.7 kg)     Lab Results  Component Value Date   WBC 6.9 01/25/2017   HGB 13.9 01/25/2017   HCT 41.9 01/25/2017   PLT 258.0 01/25/2017   GLUCOSE 90 10/08/2017   CHOL 205 (H) 10/08/2017   TRIG 108.0 10/08/2017   HDL 45.00 10/08/2017   LDLDIRECT 178.4 11/04/2013   LDLCALC 138 (H) 10/08/2017   ALT 13 10/08/2017   AST 17 10/08/2017   NA 138 10/08/2017   K 4.8 10/08/2017   CL 108 10/08/2017   CREATININE 1.05 10/08/2017   BUN 21 10/08/2017   CO2 19 10/08/2017   TSH 4.00 01/25/2017       Assessment & Plan:   Problem List Items Addressed This Visit    Environmental allergies    Steroid nasal spray and saline.  Follow.        Health care maintenance    Physical today 10/11/17.  Declines mammogram.         Hypercholesterolemia    On lipitor.  Recent cholesterol levels increased.  States she sometimes forgets to take the medication.  Have her take regularly.  Follow lipid panel and liver function tests.  Low cholesterol diet and exercise.        Relevant Orders   Hepatic function panel   Lipid panel   Hypertension    Blood pressure as outlined.  Continue same medication regimen.  Have her spot check her pressure.  Follow metabolic panel.        Relevant Orders   CBC with Differential/Platelet   TSH   Basic metabolic panel   Stress    Increased stress as outlined.  Overall she feels she is handling things well.  Does not feel needs anything more.  Follow.        Vaginal pessary in situ    Followed and changed by gyn.         Other Visit Diagnoses    Routine general medical examination at a health care facility    -  Primary       Dale Jennings, MD

## 2017-10-11 NOTE — Assessment & Plan Note (Signed)
Physical today 10/11/17.  Declines mammogram.

## 2017-10-13 ENCOUNTER — Encounter: Payer: Self-pay | Admitting: Internal Medicine

## 2017-10-13 NOTE — Assessment & Plan Note (Signed)
Steroid nasal spray and saline.  Follow.

## 2017-10-13 NOTE — Assessment & Plan Note (Signed)
Blood pressure as outlined.  Continue same medication regimen.  Have her spot check her pressure.  Follow metabolic panel.  

## 2017-10-13 NOTE — Assessment & Plan Note (Signed)
On lipitor.  Recent cholesterol levels increased.  States she sometimes forgets to take the medication.  Have her take regularly.  Follow lipid panel and liver function tests.  Low cholesterol diet and exercise.

## 2017-10-13 NOTE — Assessment & Plan Note (Signed)
Followed and changed by gyn.

## 2017-10-13 NOTE — Assessment & Plan Note (Signed)
Increased stress as outlined.  Overall she feels she is handling things well.  Does not feel needs anything more.  Follow.

## 2017-10-15 NOTE — Telephone Encounter (Signed)
Hard copy mailed  

## 2017-10-30 ENCOUNTER — Ambulatory Visit (INDEPENDENT_AMBULATORY_CARE_PROVIDER_SITE_OTHER): Payer: Medicare Other

## 2017-10-30 DIAGNOSIS — Z23 Encounter for immunization: Secondary | ICD-10-CM | POA: Diagnosis not present

## 2017-11-19 ENCOUNTER — Encounter: Payer: Self-pay | Admitting: Obstetrics and Gynecology

## 2017-11-19 ENCOUNTER — Ambulatory Visit (INDEPENDENT_AMBULATORY_CARE_PROVIDER_SITE_OTHER): Payer: Medicare Other | Admitting: Obstetrics and Gynecology

## 2017-11-19 VITALS — BP 111/69 | HR 80 | Ht 61.0 in | Wt 133.1 lb

## 2017-11-19 DIAGNOSIS — N812 Incomplete uterovaginal prolapse: Secondary | ICD-10-CM | POA: Diagnosis not present

## 2017-11-19 DIAGNOSIS — N952 Postmenopausal atrophic vaginitis: Secondary | ICD-10-CM

## 2017-11-19 DIAGNOSIS — Z4689 Encounter for fitting and adjustment of other specified devices: Secondary | ICD-10-CM

## 2017-11-19 NOTE — Progress Notes (Signed)
    GYNECOLOGY PROGRESS NOTE  Subjective:    Patient ID: Elizabeth [name], female    DOB: 06/01/1927, 83 y.o.   MRN: [id number]  HPI  Patient is a 83 y.o. 402P2002 female who presents for pessary check.  She denies pelvic discomfort, difficulty urinating.  Denies vaginal bleeding.  Denies difficulty moving her bowels.    The following portions of the patient's history were reviewed and updated as appropriate: allergies, current medications, past family history, past medical history, past social history, past surgical history and problem list.   Review of Systems Pertinent items noted in HPI and remainder of comprehensive ROS otherwise negative.   Objective:    Blood pressure 111/69, pulse 80, height 5\' 1"  (1.549 m), weight 133 lb 1.6 oz (60.4 kg). General appearance: alert and no distress  Pelvis: The patient's  Size 2 3/4 Gelhorn pessary was removed, cleaned, and reinserted without difficulty. Speculum examination revealed mildly atrophic vaginal mucosa (improved since last visit) with no lesions or lacerations.  Moderate amount of thin watery white-yellow discharge, no odor.    Assessment:   Pessary maintenance Vaginal atrophy Cystocele grade 3, Uterine prolapse Grade 1  Plan:   - Pessary cleaned today and replaced without difficulty.   - Continue to use Estrace internally twice weekly.  Can use the Trimo-san gel when she needed.   - RTC in 8-10 weeks for next pessary check.      Hildred Laserherry, Shandrea Lusk, MD [facility]

## 2017-12-18 ENCOUNTER — Telehealth: Payer: Self-pay | Admitting: Internal Medicine

## 2017-12-18 NOTE — Telephone Encounter (Unsigned)
Copied from CRM [id number]. Topic: General - Other >> Dec 18, 2017 11:17 AM Raquel SarnaHayes, Teresa G wrote: Tonna Cornerynthia Stall Clodfelter - Pt's Daughter called to let Dr.Scott know pt cared for her husband who died at home [id number] [phone] [id number] .  Pt seems to be doing ok.

## 2017-12-18 NOTE — Telephone Encounter (Signed)
FYI

## 2017-12-23 NOTE — Telephone Encounter (Signed)
Called and spoke with Elizabeth [name].  She is doing ok.  Does not need anything.  Will notify me if does.

## 2018-01-14 ENCOUNTER — Ambulatory Visit (INDEPENDENT_AMBULATORY_CARE_PROVIDER_SITE_OTHER): Payer: Medicare Other | Admitting: Obstetrics and Gynecology

## 2018-01-14 ENCOUNTER — Encounter: Payer: Self-pay | Admitting: Obstetrics and Gynecology

## 2018-01-14 VITALS — BP 117/70 | HR 92 | Ht 61.0 in | Wt 133.9 lb

## 2018-01-14 DIAGNOSIS — N952 Postmenopausal atrophic vaginitis: Secondary | ICD-10-CM | POA: Diagnosis not present

## 2018-01-14 DIAGNOSIS — N814 Uterovaginal prolapse, unspecified: Secondary | ICD-10-CM | POA: Diagnosis not present

## 2018-01-14 DIAGNOSIS — N39498 Other specified urinary incontinence: Secondary | ICD-10-CM

## 2018-01-14 DIAGNOSIS — Z4689 Encounter for fitting and adjustment of other specified devices: Secondary | ICD-10-CM | POA: Diagnosis not present

## 2018-01-14 NOTE — Progress Notes (Signed)
    GYNECOLOGY PROGRESS NOTE  Subjective:    Patient ID: Elizabeth [name], female    DOB: 03/22/1927, 84 y.o.   MRN: [id number]  HPI  Patient is a 84 y.o. 532P2002 female who presents for pessary check.  She denies pelvic discomfort, difficulty urinating.  Denies vaginal bleeding.  Denies difficulty moving her bowels. Did note some leakage over past few weeks. Has never had this before.     The following portions of the patient's history were reviewed and updated as appropriate: allergies, current medications, past family history, past medical history, past social history, past surgical history and problem list.   Review of Systems Pertinent items noted in HPI and remainder of comprehensive ROS otherwise negative.   Objective:    Blood pressure 117/70, pulse 92, height 5\' 1"  (1.549 m), weight 133 lb 14.4 oz (60.7 kg). General appearance: alert and no distress  Pelvis: The patient's  Size 2 3/4 Gelhorn pessary was removed, cleaned, and reinserted without difficulty (was noted to be malpositioned). Speculum examination revealed mildly atrophic vaginal mucosa with no lesions or lacerations.  Small amount of thin watery white-yellow discharge, no odor.    Assessment:   Pessary maintenance Vaginal atrophy Cystocele grade 3, Uterine prolapse Grade 1 Urine leakage  Plan:   - Pessary cleaned today and replaced without difficulty.   - Continue to use Estrace internally twice weekly.  Can use the Trimo-san gel when she needed.  - Urinary leakage likely secondary to malpositioned pessary. Will continue to monitor for further symptoms now that pessary has been cleaned and reinserted.   - RTC in 10 weeks for next pessary check.      Elizabeth [name], Elizabeth Tritz, MD [facility]

## 2018-02-10 ENCOUNTER — Other Ambulatory Visit (INDEPENDENT_AMBULATORY_CARE_PROVIDER_SITE_OTHER): Payer: Medicare Other

## 2018-02-10 DIAGNOSIS — I10 Essential (primary) hypertension: Secondary | ICD-10-CM

## 2018-02-10 DIAGNOSIS — E7800 Pure hypercholesterolemia, unspecified: Secondary | ICD-10-CM

## 2018-02-10 LAB — CBC WITH DIFFERENTIAL/PLATELET
BASOS PCT: 0.6 % (ref 0.0–3.0)
Basophils Absolute: 0 10*3/uL (ref 0.0–0.1)
Eosinophils Absolute: 0.1 10*3/uL (ref 0.0–0.7)
Eosinophils Relative: 1.5 % (ref 0.0–5.0)
HEMATOCRIT: 44.3 % (ref 36.0–46.0)
Hemoglobin: 14.6 g/dL (ref 12.0–15.0)
LYMPHS PCT: 30.8 % (ref 12.0–46.0)
Lymphs Abs: 2.2 10*3/uL (ref 0.7–4.0)
MCHC: 33 g/dL (ref 30.0–36.0)
MCV: 93.2 fl (ref 78.0–100.0)
MONOS PCT: 6.9 % (ref 3.0–12.0)
Monocytes Absolute: 0.5 10*3/uL (ref 0.1–1.0)
NEUTROS ABS: 4.4 10*3/uL (ref 1.4–7.7)
Neutrophils Relative %: 60.2 % (ref 43.0–77.0)
PLATELETS: 245 10*3/uL (ref 150.0–400.0)
RBC: 4.76 Mil/uL (ref 3.87–5.11)
RDW: 14.3 % (ref 11.5–15.5)
WBC: 7.3 10*3/uL (ref 4.0–10.5)

## 2018-02-10 LAB — LIPID PANEL
CHOL/HDL RATIO: 3
Cholesterol: 154 mg/dL (ref 0–200)
HDL: 54.5 mg/dL (ref >39.00)
LDL Cholesterol: 80 mg/dL (ref 0–99)
NONHDL: 99.41
Triglycerides: 95 mg/dL (ref 0.0–149.0)
VLDL: 19 mg/dL (ref 0.0–40.0)

## 2018-02-10 LAB — BASIC METABOLIC PANEL
BUN: 20 mg/dL (ref 6–23)
CALCIUM: 9 mg/dL (ref 8.4–10.5)
CO2: 20 meq/L (ref 19–32)
CREATININE: 1.04 mg/dL (ref 0.40–1.20)
Chloride: 102 mEq/L (ref 96–112)
GFR: 53.03 mL/min — ABNORMAL LOW (ref >60.00)
GLUCOSE: 103 mg/dL — AB (ref 70–99)
Potassium: 4.5 mEq/L (ref 3.5–5.1)
SODIUM: 132 meq/L — AB (ref 135–145)

## 2018-02-10 LAB — HEPATIC FUNCTION PANEL
ALT: 18 U/L (ref 0–35)
AST: 17 U/L (ref 0–37)
Albumin: 4.4 g/dL (ref 3.5–5.2)
Alkaline Phosphatase: 60 U/L (ref 39–117)
BILIRUBIN DIRECT: 0.1 mg/dL (ref 0.0–0.3)
TOTAL PROTEIN: 6.8 g/dL (ref 6.0–8.3)
Total Bilirubin: 0.5 mg/dL (ref 0.2–1.2)

## 2018-02-10 LAB — TSH: TSH: 3.78 u[IU]/mL (ref 0.35–4.50)

## 2018-02-11 ENCOUNTER — Encounter: Payer: Self-pay | Admitting: Internal Medicine

## 2018-02-11 ENCOUNTER — Ambulatory Visit: Payer: Medicare Other | Admitting: Internal Medicine

## 2018-02-11 DIAGNOSIS — N8110 Cystocele, unspecified: Secondary | ICD-10-CM

## 2018-02-11 DIAGNOSIS — I10 Essential (primary) hypertension: Secondary | ICD-10-CM | POA: Diagnosis not present

## 2018-02-11 DIAGNOSIS — E7800 Pure hypercholesterolemia, unspecified: Secondary | ICD-10-CM

## 2018-02-11 DIAGNOSIS — Z9109 Other allergy status, other than to drugs and biological substances: Secondary | ICD-10-CM | POA: Diagnosis not present

## 2018-02-11 DIAGNOSIS — R000 Tachycardia, unspecified: Secondary | ICD-10-CM

## 2018-02-11 DIAGNOSIS — F439 Reaction to severe stress, unspecified: Secondary | ICD-10-CM | POA: Diagnosis not present

## 2018-02-11 NOTE — Progress Notes (Signed)
Patient ID: Elizabeth [name], female   DOB: 09/12/1927, 84 y.o.   MRN: [id number]   Subjective:    Patient ID: Elizabeth [name], female    DOB: 09/04/1927, 84 y.o.   MRN: [id number]  HPI  Patient here for a scheduled follow up.  She is dealing the the recent loss of her husband.  He recently passed away.  Discussed with her today.  She feels she is handling things relatively well.  Does not feel she needs anything more at this time.  No chest pain.  No sob.  Still some intermittent episodes of palpitations.  Discussed with her today.  States this unchanged and has been present for years.  Has had cardiac w/up previously.  Desires no further cardiac w/up.  No acid reflux.  No abdominal pain.  Bowels moving.  No urine change.  Sees gyn for pessary changes.     Past Medical History:  Diagnosis Date  . Cystocele or rectocele with incomplete uterine prolapse   . Hyperlipidemia   . Hypertension   . Irritable bowel syndrome   . Vaginal atrophy    Past Surgical History:  Procedure Laterality Date  . CATARACT EXTRACTION  2006   Family History  Problem Relation Age of Onset  . Transient ischemic attack Mother        multiple   . Dementia Mother   . Stroke Father   . Heart disease Brother        MI - died age 82  . Leukemia Brother   . Lung cancer Brother   . Throat cancer Brother        had heart disease also  . Heart disease Sister   . Dementia Sister    Social History   Socioeconomic History  . Marital status: Married    Spouse name: None  . Number of children: 2  . Years of education: None  . Highest education level: None  Social Needs  . Financial resource strain: None  . Food insecurity - worry: None  . Food insecurity - inability: None  . Transportation needs - medical: None  . Transportation needs - non-medical: None  Occupational History  . None  Tobacco Use  . Smoking status: Never Smoker  . Smokeless tobacco: Never Used  Substance and Sexual Activity  . Alcohol use:  No    Alcohol/week: 0.0 oz  . Drug use: No  . Sexual activity: Not Currently    Birth control/protection: None  Other Topics Concern  . None  Social History Narrative  . None    Outpatient Encounter Medications as of 02/11/2018  Medication Sig  . aspirin 81 MG tablet Take 81 mg by mouth daily.  . cetirizine (ZYRTEC) 10 MG tablet Take 10 mg by mouth daily as needed.   . Cholecalciferol (VITAMIN D-3) 1000 UNITS CAPS Take by mouth.  . estradiol (ESTRACE VAGINAL) 0.1 MG/GM vaginal cream Place 1 Applicatorful vaginally 2 (two) times a week.  . hydrALAZINE (APRESOLINE) 25 MG tablet Take 1 tablet (25 mg total) by mouth 3 (three) times daily as needed.  Marland Kitchen. LIPITOR 40 MG tablet TAKE 1 TABLET BY MOUTH ONCE DAILY  . propranolol (INDERAL) 10 MG tablet Take 1 tablet (10 mg total) by mouth 3 (three) times daily as needed.  . quinapril (ACCUPRIL) 40 MG tablet Take 1 tablet (40 mg total) by mouth daily.  Marland Kitchen. triamcinolone (NASACORT) 55 MCG/ACT nasal inhaler Place 2 sprays into the nose daily as needed.    No  facility-administered encounter medications on file as of 02/11/2018.     Review of Systems  Constitutional: Negative for appetite change and unexpected weight change.  HENT: Negative for congestion and sinus pressure.   Respiratory: Negative for cough, chest tightness and shortness of breath.   Cardiovascular: Negative for chest pain and leg swelling.  Gastrointestinal: Negative for abdominal pain, diarrhea, nausea and vomiting.  Genitourinary: Negative for difficulty urinating and dysuria.  Musculoskeletal: Negative for joint swelling and myalgias.  Skin: Negative for color change and rash.  Neurological: Negative for dizziness, light-headedness and headaches.  Psychiatric/Behavioral: Negative for agitation and dysphoric mood.       Objective:    Physical Exam  Constitutional: She appears well-developed and well-nourished. No distress.  HENT:  Nose: Nose normal.  Mouth/Throat:  Oropharynx is clear and moist.  Neck: Neck supple. No thyromegaly present.  Cardiovascular: Normal rate and regular rhythm.  Pulmonary/Chest: Breath sounds normal. No respiratory distress. She has no wheezes.  Abdominal: Soft. Bowel sounds are normal. There is no tenderness.  Musculoskeletal: She exhibits no edema or tenderness.  Lymphadenopathy:    She has no cervical adenopathy.  Skin: No rash noted. No erythema.  Psychiatric: Her behavior is normal.    BP 128/70 (BP Location: Left Arm, Patient Position: Sitting, Cuff Size: Normal)   Pulse 95   Temp 97.6 F (36.4 C) (Oral)   Resp 16   Wt 133 lb 9.6 oz (60.6 kg)   SpO2 96%   BMI 25.24 kg/m  Wt Readings from Last 3 Encounters:  02/11/18 133 lb 9.6 oz (60.6 kg)  01/14/18 133 lb 14.4 oz (60.7 kg)  11/19/17 133 lb 1.6 oz (60.4 kg)     Lab Results  Component Value Date   WBC 7.3 02/10/2018   HGB 14.6 02/10/2018   HCT 44.3 02/10/2018   PLT 245.0 02/10/2018   GLUCOSE 103 (H) 02/10/2018   CHOL 154 02/10/2018   TRIG 95.0 02/10/2018   HDL 54.50 02/10/2018   LDLDIRECT 178.4 11/04/2013   LDLCALC 80 02/10/2018   ALT 18 02/10/2018   AST 17 02/10/2018   NA 132 (L) 02/10/2018   K 4.5 02/10/2018   CL 102 02/10/2018   CREATININE 1.04 02/10/2018   BUN 20 02/10/2018   CO2 20 02/10/2018   TSH 3.78 02/10/2018       Assessment & Plan:   Problem List Items Addressed This Visit    Baden-Walker grade 4 cystocele    Followed by Dr Valentino Saxon.  She changes her pessary. Stable.  Follow.       Environmental allergies    Controlled.       Hypercholesterolemia    On lipitor.  Low cholesterol diet and exercise.  Follow lipid panel and liver function tests.        Relevant Orders   Hepatic function panel   Lipid panel   Hypertension    Blood pressure under good control.  Continue same medication regimen.  Follow pressures.  Follow metabolic panel.        Relevant Orders   Basic metabolic panel   Stress    Increased stress as  outlined.  Discussed with her today.  She does not feel she needs anything more at this time.  Follow.        Tachycardia    She feels things are stable.  Has seen cardiology.  Has had extensive w/up.  Desires no further intervention.  Follow.  Einar Pheasant, MD

## 2018-02-14 ENCOUNTER — Encounter: Payer: Self-pay | Admitting: Internal Medicine

## 2018-02-14 NOTE — Assessment & Plan Note (Signed)
She feels things are stable.  Has seen cardiology.  Has had extensive w/up.  Desires no further intervention.  Follow.

## 2018-02-14 NOTE — Assessment & Plan Note (Signed)
Increased stress as outlined.  Discussed with her today.  She does not feel she needs anything more at this time.  Follow.   

## 2018-02-14 NOTE — Assessment & Plan Note (Signed)
Controlled.  

## 2018-02-14 NOTE — Assessment & Plan Note (Signed)
Blood pressure under good control.  Continue same medication regimen.  Follow pressures.  Follow metabolic panel.   

## 2018-02-14 NOTE — Assessment & Plan Note (Signed)
Followed by Dr Valentino Saxonherry.  She changes her pessary. Stable.  Follow.

## 2018-02-14 NOTE — Assessment & Plan Note (Signed)
On lipitor.  Low cholesterol diet and exercise.  Follow lipid panel and liver function tests.   

## 2018-03-11 ENCOUNTER — Ambulatory Visit (INDEPENDENT_AMBULATORY_CARE_PROVIDER_SITE_OTHER): Payer: Medicare Other | Admitting: Obstetrics and Gynecology

## 2018-03-11 ENCOUNTER — Encounter: Payer: Self-pay | Admitting: Obstetrics and Gynecology

## 2018-03-11 VITALS — BP 97/62 | HR 82 | Ht 61.0 in | Wt 134.2 lb

## 2018-03-11 DIAGNOSIS — N952 Postmenopausal atrophic vaginitis: Secondary | ICD-10-CM

## 2018-03-11 DIAGNOSIS — Z4689 Encounter for fitting and adjustment of other specified devices: Secondary | ICD-10-CM

## 2018-03-11 DIAGNOSIS — N814 Uterovaginal prolapse, unspecified: Secondary | ICD-10-CM | POA: Diagnosis not present

## 2018-03-11 NOTE — Progress Notes (Signed)
Pt stated pessary is helping no concerns.

## 2018-03-11 NOTE — Progress Notes (Signed)
    GYNECOLOGY PROGRESS NOTE  Subjective:    Patient ID: Elizabeth [name], female    DOB: 03/21/1927, 84 y.o.   MRN: [id number]  HPI  Patient is a 84 y.o. 482P2002 female who presents for pessary check.  She denies pelvic discomfort, difficulty urinating.  Denies vaginal bleeding.  Denies difficulty moving her bowels.  Patient wonders if she can try a different type of pessary.  Notes that now that her husband has passed and is no longer a caregiver, she would like to be able to manage the pessary herself.    The following portions of the patient's history were reviewed and updated as appropriate: allergies, current medications, past family history, past medical history, past social history, past surgical history and problem list.   Review of Systems Pertinent items noted in HPI and remainder of comprehensive ROS otherwise negative.   Objective:    Blood pressure 97/62, pulse 82, height 5\' 1"  (1.549 m), weight 134 lb 3.2 oz (60.9 kg). General appearance: alert and no distress  Pelvis: The patient's  Size 2 3/4 Gelhorn pessary was removed, cleaned, and reinserted without difficulty. Speculum examination revealed mildly atrophic vaginal mucosa with no lesions or lacerations.  Small amount of thin watery white-yellow discharge, no odor.    Assessment:   Pessary maintenance Vaginal atrophy Cystocele grade 3, Uterine prolapse Grade 1  Plan:   - Pessary cleaned today and replaced without difficulty.   -  Has decreased use of Estrace to once weekly.  Can use the Trimo-san gel when she needed.   - RTC in 10 weeks for next pessary check.  Will also try pessary refitting at that time.     Hildred Laserherry, Travin Marik, MD [facility]

## 2018-04-15 ENCOUNTER — Other Ambulatory Visit: Payer: Self-pay | Admitting: Obstetrics and Gynecology

## 2018-04-15 DIAGNOSIS — N952 Postmenopausal atrophic vaginitis: Secondary | ICD-10-CM

## 2018-05-06 ENCOUNTER — Encounter: Payer: Self-pay | Admitting: Obstetrics and Gynecology

## 2018-05-06 ENCOUNTER — Ambulatory Visit (INDEPENDENT_AMBULATORY_CARE_PROVIDER_SITE_OTHER): Payer: Medicare Other | Admitting: Obstetrics and Gynecology

## 2018-05-06 VITALS — BP 107/61 | HR 81 | Ht 61.0 in | Wt 132.8 lb

## 2018-05-06 DIAGNOSIS — N393 Stress incontinence (female) (male): Secondary | ICD-10-CM | POA: Diagnosis not present

## 2018-05-06 DIAGNOSIS — N814 Uterovaginal prolapse, unspecified: Secondary | ICD-10-CM

## 2018-05-06 DIAGNOSIS — Z4689 Encounter for fitting and adjustment of other specified devices: Secondary | ICD-10-CM | POA: Diagnosis not present

## 2018-05-06 DIAGNOSIS — N952 Postmenopausal atrophic vaginitis: Secondary | ICD-10-CM

## 2018-05-06 DIAGNOSIS — N898 Other specified noninflammatory disorders of vagina: Secondary | ICD-10-CM

## 2018-05-06 NOTE — Progress Notes (Signed)
Pt is present today to get her pessary refitting.

## 2018-05-06 NOTE — Patient Instructions (Signed)
Urinary Incontinence Urinary incontinence is the involuntary loss of urine from your bladder. What are the causes? There are many causes of urinary incontinence. They include:  Medicines.  Infections.  Prostatic enlargement, leading to overflow of urine from your bladder.  Surgery.  Neurological diseases.  Emotional factors.  What are the signs or symptoms? Urinary Incontinence can be divided into four types: 1. Urge incontinence. Urge incontinence is the involuntary loss of urine before you have the opportunity to go to the bathroom. There is a sudden urge to void but not enough time to reach a bathroom. 2. Stress incontinence. Stress incontinence is the sudden loss of urine with any activity that forces urine to pass. It is commonly caused by anatomical changes to the pelvis and sphincter areas of your body. 3. Overflow incontinence. Overflow incontinence is the loss of urine from an obstructed opening to your bladder. This results in a backup of urine and a resultant buildup of pressure within the bladder. When the pressure within the bladder exceeds the closing pressure of the sphincter, the urine overflows, which causes incontinence, similar to water overflowing a dam. 4. Total incontinence. Total incontinence is the loss of urine as a result of the inability to store urine within your bladder.  How is this diagnosed? Evaluating the cause of incontinence may require:  A thorough and complete medical and obstetric history.  A complete physical exam.  Laboratory tests such as a urine culture and sensitivities.  When additional tests are indicated, they can include:  An ultrasound exam.  Kidney and bladder X-rays.  Cystoscopy. This is an exam of the bladder using a narrow scope.  Urodynamic testing to test the nerve function to the bladder and sphincter areas.  How is this treated? Treatment for urinary incontinence depends on the cause:  For urge incontinence caused  by a bacterial infection, antibiotics will be prescribed. If the urge incontinence is related to medicines you take, your health care provider may have you change the medicine.  For stress incontinence, surgery to re-establish anatomical support to the bladder or sphincter, or both, will often correct the condition.  For overflow incontinence caused by an enlarged prostate, an operation to open the channel through the enlarged prostate will allow the flow of urine out of the bladder. In women with fibroids, a hysterectomy may be recommended.  For total incontinence, surgery on your urinary sphincter may help. An artificial urinary sphincter (an inflatable cuff placed around the urethra) may be required. In women who have developed a hole-like passage between their bladder and vagina (vesicovaginal fistula), surgery to close the fistula often is required.  Follow these instructions at home:  Normal daily hygiene and the use of pads or adult diapers that are changed regularly will help prevent odors and skin damage.  Avoid caffeine. It can overstimulate your bladder.  Use the bathroom regularly. Try about every 2-3 hours to go to the bathroom, even if you do not feel the need to do so. Take time to empty your bladder completely. After urinating, wait a minute. Then try to urinate again.  For causes involving nerve dysfunction, keep a log of the medicines you take and a journal of the times you go to the bathroom. Contact a health care provider if:  You experience worsening of pain instead of improvement in pain after your procedure.  Your incontinence becomes worse instead of better. Get help right away if:  You experience fever or shaking chills.  You are unable to   pass your urine.  You have redness spreading into your groin or down into your thighs. This information is not intended to replace advice given to you by your health care provider. Make sure you discuss any questions you have  with your health care provider. Document Released: 01/17/2005 Document Revised: 07/20/2016 Document Reviewed: 05/19/2013 [employer] Interactive Patient Education  2018 [employer]  

## 2018-05-06 NOTE — Progress Notes (Signed)
    GYNECOLOGY PROGRESS NOTE  Subjective:    Patient ID: Elizabeth [name], female    DOB: 02/14/1927, 84 y.o.   MRN: [id number]  HPI  Patient is a 84 y.o. G59P2002 female who presents for pessary fitting.  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  She denies pelvic discomfort, difficulty urinating.  Denies vaginal bleeding.  Denies difficulty moving her bowels.   The following portions of the patient's history were reviewed and updated as appropriate: allergies, current medications, past family history, past medical history, past social history, past surgical history and problem list.   Review of Systems Genitourinary:positive for vaginal discharge and urinary incontinence.  Vaginal discharge has been off and on for several months, however patient notes that last month it had a "different odor, almost fishy" for several days, then resolved.  Urinary incontinence has been over the past several weeks, small amounts, mostly when coughing or changing position.  Occasionally has some associated urgency.  Denies dysuria, hematuria.   Objective:    Blood pressure 107/61, pulse 81, height  (1.549 m), weight 132 lb 12.8 oz (60.2 kg). General appearance: alert and no distress  Pelvis: The patient's  Size 2 3/4 Gelhorn pessary was removed, cleaned, and reinserted without difficulty. Speculum examination revealed mildly atrophic vaginal mucosa with no lesions or lacerations.  Small amount of thin watery white-yellow discharge, no odor.    Microscopic wet-mount exam shows negative for pathogens, normal epithelial cells.     Assessment:   Pessary maintenance Vaginal atrophy Cystocele grade 3, Uterine prolapse Grade 1  Vaginal discharge  Plan:   - Pessary fitting performed today. Will order Size 2 3/4 incontinence dish with knob. Old  pessary cleaned today and given back patient.  RTC in 10 days for pessary insertion.  -  Continue use of Estrace to once weekly.  Can use the Trimo-san gel when she needed.  - Wet prep normal. Discussed hygiene measures.    Hildred Laser, MD [facility]

## 2018-05-20 ENCOUNTER — Encounter: Payer: Medicare Other | Admitting: Obstetrics and Gynecology

## 2018-05-20 ENCOUNTER — Other Ambulatory Visit: Payer: Self-pay | Admitting: Internal Medicine

## 2018-05-21 ENCOUNTER — Ambulatory Visit (INDEPENDENT_AMBULATORY_CARE_PROVIDER_SITE_OTHER): Payer: Medicare Other | Admitting: Obstetrics and Gynecology

## 2018-05-21 ENCOUNTER — Encounter: Payer: Self-pay | Admitting: Obstetrics and Gynecology

## 2018-05-21 VITALS — BP 129/75 | HR 79 | Wt 132.4 lb

## 2018-05-21 DIAGNOSIS — N814 Uterovaginal prolapse, unspecified: Secondary | ICD-10-CM

## 2018-05-21 DIAGNOSIS — N952 Postmenopausal atrophic vaginitis: Secondary | ICD-10-CM

## 2018-05-21 DIAGNOSIS — Z4689 Encounter for fitting and adjustment of other specified devices: Secondary | ICD-10-CM | POA: Diagnosis not present

## 2018-05-21 NOTE — Progress Notes (Signed)
Pt is present today to have her pessary inserted. No other concerns.

## 2018-05-21 NOTE — Progress Notes (Signed)
    GYNECOLOGY PROGRESS NOTE  Subjective:    Patient ID: Elizabeth [name], female    DOB: Jun 05, 1927, 84 y.o.   MRN: [id number]  HPI  Patient is a 84 y.o. G69P2002 female who presents for pessary insertion.  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 She notes that her leaking and vaginal odor has stopped since having the pessary out, but notes pelvic pressure has increased.    The following portions of the patient's history were reviewed and updated as appropriate: allergies, current medications, past family history, past medical history, past social history, past surgical history and problem list.   Review of Systems Pertinent items noted in HPI and remainder of comprehensive ROS otherwise negative.  Objective:    Blood pressure 129/75, pulse 79, weight 132 lb 6.4 oz (60.1 kg). General appearance: alert and no distress  Pelvis: The patient's  Size#3 incontinence dish with knob was placed today.  Speculum examination revealed mildly atrophic vaginal mucosa with no lesions or lacerations.  Small amount of thin watery white-yellow discharge, no odor.    Assessment:   Pessary maintenance Vaginal atrophy Cystocele grade 3, Uterine prolapse Grade 1   Plan:   - Pessary insertion done today.  RTC in 3 weeks for f/u of new pessary.  -  Continue use of Estrace to once weekly.  Can use the Trimo-san gel when she needed once every 1-2 weeks. Given new tube.      Hildred Laser, MD [facility]

## 2018-05-28 ENCOUNTER — Ambulatory Visit (INDEPENDENT_AMBULATORY_CARE_PROVIDER_SITE_OTHER): Payer: Medicare Other

## 2018-05-28 VITALS — BP 120/64 | HR 83 | Temp 98.5°F | Resp 14 | Ht 61.0 in | Wt 132.8 lb

## 2018-05-28 DIAGNOSIS — Z0000 Encounter for general adult medical examination without abnormal findings: Secondary | ICD-10-CM | POA: Diagnosis not present

## 2018-05-28 NOTE — Patient Instructions (Addendum)
  [name] , Thank you for taking time to come for your Medicare Wellness Visit. I appreciate your ongoing commitment to your health goals. Please review the following plan we discussed and let me know if I can assist you in the future.   Follow up as needed.    Bring a copy of your Health Care Power of Attorney and/or Living Will to be scanned into chart.  Have a great day!  These are the goals we discussed: Goals    . Increase physical activity     Upper body strength and training weight exercises    . Low Carb Foods       This is a list of the screening recommended for you and due dates:  Health Maintenance  Topic Date Due  . Tetanus Vaccine  01/27/1948  . DEXA scan (bone density measurement)  06/22/2018*  . Flu Shot  07/24/2018  . Pneumonia vaccines  Completed  . Mammogram  Discontinued  *Topic was postponed. The date shown is not the original due date.

## 2018-05-28 NOTE — Progress Notes (Addendum)
Subjective:   Elizabeth [name] is a 84 y.o. female who presents for Medicare Annual (Subsequent) preventive examination.  Review of Systems:  No ROS.  Medicare Wellness Visit. Additional risk factors are reflected in the social history. Cardiac Risk Factors include: advanced age (>89men, >59 women);hypertension     Objective:     Vitals: BP 120/64 (BP Location: Left Arm, Patient Position: Sitting, Cuff Size: Normal)   Pulse 83   Temp 98.5 F (36.9 C) (Oral)   Resp 14   Ht 5\' 1"  (1.549 m)   Wt 132 lb 12.8 oz (60.2 kg)   SpO2 98%   BMI 25.09 kg/m   Body mass index is 25.09 kg/m.  Advanced Directives 05/28/2018 05/27/2017  Does Patient Have a Medical Advance Directive? Yes No  Type of Estate agent of [place];Living will -  Does patient want to make changes to medical advance directive? No - Patient declined Yes (MAU/Ambulatory/Procedural Areas - Information given)  Copy of Healthcare Power of Attorney in Chart? No - copy requested -    Tobacco Social History   Tobacco Use  Smoking Status Never Smoker  Smokeless Tobacco Never Used     Counseling given: Not Answered   Clinical Intake:  Pre-visit preparation completed: Yes  Pain : No/denies pain     Nutritional Status: BMI 25 -29 Overweight Diabetes: No  How often do you need to have someone help you when you read instructions, pamphlets, or other written materials from your doctor or pharmacy?: 1 - Never  Interpreter Needed?: No     Past Medical History:  Diagnosis Date  . Cystocele or rectocele with incomplete uterine prolapse   . Hyperlipidemia   . Hypertension   . Irritable bowel syndrome   . Vaginal atrophy    Past Surgical History:  Procedure Laterality Date  . CATARACT EXTRACTION  2006   Family History  Problem Relation Age of Onset  . Transient ischemic attack Mother        multiple   . Dementia Mother   . Stroke Father   . Heart disease Brother        MI - died age  35  . Leukemia Brother   . Lung cancer Brother   . Throat cancer Brother        had heart disease also  . Heart disease Sister   . Dementia Sister    Social History   Socioeconomic History  . Marital status: Married    Spouse name: Not on file  . Number of children: 2  . Years of education: Not on file  . Highest education level: Not on file  Occupational History  . Not on file  Social Needs  . Financial resource strain: Not on file  . Food insecurity:    Worry: Not on file    Inability: Not on file  . Transportation needs:    Medical: Not on file    Non-medical: Not on file  Tobacco Use  . Smoking status: Never Smoker  . Smokeless tobacco: Never Used  Substance and Sexual Activity  . Alcohol use: No    Alcohol/week: 0.0 oz  . Drug use: No  . Sexual activity: Not Currently    Birth control/protection: None  Lifestyle  . Physical activity:    Days per week: Not on file    Minutes per session: Not on file  . Stress: Not on file  Relationships  . Social connections:    Talks on phone:  Not on file    Gets together: Not on file    Attends religious service: Not on file    Active member of club or organization: Not on file    Attends meetings of clubs or organizations: Not on file    Relationship status: Not on file  Other Topics Concern  . Not on file  Social History Narrative  . Not on file    Outpatient Encounter Medications as of 05/28/2018  Medication Sig  . aspirin 81 MG tablet Take 81 mg by mouth daily.  . cetirizine (ZYRTEC) 10 MG tablet Take 10 mg by mouth daily as needed.   . Cholecalciferol (VITAMIN D-3) 1000 UNITS CAPS Take by mouth.  . estradiol (ESTRACE) 0.1 MG/GM vaginal cream PLACE 1 APPLICATORFUL VAGINALLY 2 TIMES A WEEK  . hydrALAZINE (APRESOLINE) 25 MG tablet Take 1 tablet (25 mg total) by mouth 3 (three) times daily as needed.  Marland Kitchen LIPITOR 40 MG tablet TAKE 1 TABLET BY MOUTH ONCE DAILY  . propranolol (INDERAL) 10 MG tablet Take 1 tablet (10 mg  total) by mouth 3 (three) times daily as needed.  . quinapril (ACCUPRIL) 40 MG tablet Take 1 tablet (40 mg total) by mouth daily.  Marland Kitchen triamcinolone (NASACORT) 55 MCG/ACT nasal inhaler Place 2 sprays into the nose daily as needed.   . [DISCONTINUED] estradiol (ESTRACE VAGINAL) 0.1 MG/GM vaginal cream Place 1 Applicatorful vaginally 2 (two) times a week.  . [DISCONTINUED] LIPITOR 40 MG tablet TAKE 1 TABLET BY MOUTH ONCE DAILY   No facility-administered encounter medications on file as of 05/28/2018.     Activities of Daily Living In your present state of health, do you have any difficulty performing the following activities: 05/28/2018  Hearing? Y  Comment Hearing aid, bilateral  Vision? N  Difficulty concentrating or making decisions? N  Walking or climbing stairs? N  Dressing or bathing? N  Doing errands, shopping? N  Preparing Food and eating ? N  Using the Toilet? N  In the past six months, have you accidently leaked urine? Y  Comment Followed by Dr. Valentino Saxon and pcp  Do you have problems with loss of bowel control? N  Managing your Medications? N  Managing your Finances? N  Housekeeping or managing your Housekeeping? N  Some recent data might be hidden    Patient Care Team: Dale Medon, MD as PCP - General (Internal Medicine)    Assessment:   This is a routine wellness examination for Elizabeth [name].  The goal of the wellness visit is to assist the patient how to close the gaps in care and create a preventative care plan for the patient.   The roster of all physicians providing medical care to patient is listed in the Snapshot section of the chart.  Osteoporosis risk reviewed.    Safety issues reviewed; Lives alone. Smoke and carbon monoxide detectors in the home. No firearms in the home. Wears seatbelts when driving or riding with others. No violence in the home.  Life alert information provided.   They do not have excessive sun exposure.  Discussed the need for sun protection:  hats, long sleeves and the use of sunscreen if there is significant sun exposure.  Patient is alert, normal appearance, oriented to person/place/and time.  Correctly identified the president of the [place] and recalls of 3/3 words. Performs simple calculations and can read correct time from watch face.  Displays appropriate judgement.  No new identified risk were noted.  No failures at ADL's.  BMI- discussed the importance of a healthy diet, water intake and the benefits of aerobic exercise. Educational material provided.   24 hour diet recall: Regular diet  Dental- every 6 months.  Eye- Visual acuity not assessed per patient preference since they have regular follow up with the ophthalmologist.  Wears corrective lenses.  Sleep patterns- Sleeps 6 hours at night.  Wakes feeling rested.   TDAP vaccine deferred per patient preference.  Follow up with insurance.  Educational material provided.  Patient Concerns: None at this time. Follow up with PCP as needed.  Exercise Activities and Dietary recommendations Current Exercise Habits: Home exercise routine, Type of exercise: stretching;treadmill;strength training/weights, Time (Minutes): 30, Frequency (Times/Week): 6, Weekly Exercise (Minutes/Week): 180, Intensity: Mild  Goals    . Increase physical activity     Upper body strength and training weight exercises    . Low Carb Foods       Fall Risk Fall Risk  05/28/2018 05/27/2017 09/28/2016 07/19/2015 07/14/2014  Falls in the past year? No No No No No   Depression Screen PHQ 2/9 Scores 05/28/2018 05/27/2017 09/28/2016 07/19/2015  PHQ - 2 Score 0 0 0 0     Cognitive Function MMSE - Mini Mental State Exam 05/28/2018  Orientation to time 5  Orientation to Place 5  Registration 3  Attention/ Calculation 5  Recall 3  Language- name 2 objects 2  Language- repeat 1  Language- follow 3 step command 3  Language- read & follow direction 1  Write a sentence 1  Copy design 1  Total score 30      6CIT Screen 05/27/2017  What Year? 0 points  What month? 0 points  What time? 0 points  Count back from 20 0 points  Months in reverse 0 points  Repeat phrase 0 points  Total Score 0    Immunization History  Administered Date(s) Administered  . Influenza Split 10/19/2009, 10/13/2013, 09/30/2014  . Influenza, High Dose Seasonal PF 09/28/2016, 10/30/2017  . Influenza-Unspecified 10/24/2015  . Pneumococcal Conjugate-13 03/11/2014  . Pneumococcal Polysaccharide-23 05/31/2017   Screening Tests Health Maintenance  Topic Date Due  . TETANUS/TDAP  01/27/1948  . DEXA SCAN  06/22/2018 (Originally 01/26/1994)  . INFLUENZA VACCINE  07/24/2018  . PNA vac Low Risk Adult  Completed  . MAMMOGRAM  Discontinued      Plan:    End of life planning; Advance aging; Advanced directives discussed. Copy of current HCPOA/Living Will requested.    I have personally reviewed and noted the following in the patient's chart:   . Medical and social history . Use of alcohol, tobacco or illicit drugs  . Current medications and supplements . Functional ability and status . Nutritional status . Physical activity . Advanced directives . List of other physicians . Hospitalizations, surgeries, and ER visits in previous 12 months . Vitals . Screenings to include cognitive, depression, and falls . Referrals and appointments  In addition, I have reviewed and discussed with patient certain preventive protocols, quality metrics, and best practice recommendations. A written personalized care plan for preventive services as well as general preventive health recommendations were provided to patient.     OBrien-Blaney, Khalise Billard L, LPN  5/2/84136/04/2018   Reviewed above information.  Agree with assessment and plan.    Dr Lorin PicketScott

## 2018-06-11 ENCOUNTER — Ambulatory Visit (INDEPENDENT_AMBULATORY_CARE_PROVIDER_SITE_OTHER): Payer: Medicare Other | Admitting: Obstetrics and Gynecology

## 2018-06-11 ENCOUNTER — Encounter: Payer: Self-pay | Admitting: Obstetrics and Gynecology

## 2018-06-11 VITALS — BP 133/81 | HR 91 | Ht 61.0 in | Wt 133.0 lb

## 2018-06-11 DIAGNOSIS — Z4689 Encounter for fitting and adjustment of other specified devices: Secondary | ICD-10-CM | POA: Diagnosis not present

## 2018-06-11 DIAGNOSIS — N952 Postmenopausal atrophic vaginitis: Secondary | ICD-10-CM | POA: Diagnosis not present

## 2018-06-11 DIAGNOSIS — N814 Uterovaginal prolapse, unspecified: Secondary | ICD-10-CM | POA: Diagnosis not present

## 2018-06-11 NOTE — Progress Notes (Signed)
Pt stated that she is unable to keep the pessary in place and it kept falling out. No other concerns.

## 2018-06-11 NOTE — Progress Notes (Signed)
    GYNECOLOGY PROGRESS NOTE  Subjective:    Patient ID: Elizabeth [name], female    DOB: 07/29/1927, 84 y.o.   MRN: [id number]  HPI  Patient is a 84 y.o. 842P2002 female who presents for pessary re-insertion.  She previously used a size 2-3/4 (70 mm) Gelhorn pessary, but was changed to Size 70 mm incontinence dish with knob, but notes that it kept becoming dislodged and has fallen out.  She notes that her leaking and vaginal odor has stopped since having the pessary out, but notes pelvic pressure has increased. She also reports that her stools have changed to smaller, more frequent stools, however does not like this.    The following portions of the patient's history were reviewed and updated as appropriate: allergies, current medications, past family history, past medical history, past social history, past surgical history and problem list.   Review of Systems Pertinent items noted in HPI and remainder of comprehensive ROS otherwise negative.  Objective:    Blood pressure 133/81, pulse 91, height 5\' 1"  (1.549 m), weight 133 lb (60.3 kg). General appearance: alert and no distress  Pelvis: The patient's  Size 2 3/4 incontinence dish with knob was placed today.  Speculum examination revealed mildly atrophic vaginal mucosa with no lesions or lacerations.  Small amount of thin watery white-yellow discharge, no odor.    Assessment:   Pessary maintenance Vaginal atrophy Cystocele grade 3, Uterine prolapse Grade 1   Plan:   - Pessary re-insertion done today.  RTC in 3 weeks for f/u of new pessary.  -  Continue use of Estrace to once weekly.  Can use the Trimo-san gel when she needed once every 1-2 weeks.  - Return to clinic in 8 weeks.    Elizabeth [name], Elizabeth Barbee, MD [facility]

## 2018-06-17 ENCOUNTER — Other Ambulatory Visit (INDEPENDENT_AMBULATORY_CARE_PROVIDER_SITE_OTHER): Payer: Medicare Other

## 2018-06-17 DIAGNOSIS — E7800 Pure hypercholesterolemia, unspecified: Secondary | ICD-10-CM | POA: Diagnosis not present

## 2018-06-17 DIAGNOSIS — I10 Essential (primary) hypertension: Secondary | ICD-10-CM | POA: Diagnosis not present

## 2018-06-17 LAB — HEPATIC FUNCTION PANEL
ALT: 12 U/L (ref 0–35)
AST: 15 U/L (ref 0–37)
Albumin: 4.3 g/dL (ref 3.5–5.2)
Alkaline Phosphatase: 60 U/L (ref 39–117)
BILIRUBIN DIRECT: 0 mg/dL (ref 0.0–0.3)
BILIRUBIN TOTAL: 0.4 mg/dL (ref 0.2–1.2)
Total Protein: 6.7 g/dL (ref 6.0–8.3)

## 2018-06-17 LAB — LIPID PANEL
CHOLESTEROL: 185 mg/dL (ref 0–200)
HDL: 57.1 mg/dL (ref >39.00)
LDL Cholesterol: 111 mg/dL — ABNORMAL HIGH (ref 0–99)
NONHDL: 127.93
Total CHOL/HDL Ratio: 3
Triglycerides: 85 mg/dL (ref 0.0–149.0)
VLDL: 17 mg/dL (ref 0.0–40.0)

## 2018-06-17 LAB — BASIC METABOLIC PANEL
BUN: 24 mg/dL — AB (ref 6–23)
CO2: 25 mEq/L (ref 19–32)
CREATININE: 1.1 mg/dL (ref 0.40–1.20)
Calcium: 9.2 mg/dL (ref 8.4–10.5)
Chloride: 101 mEq/L (ref 96–112)
GFR: 49.66 mL/min — AB (ref >60.00)
Glucose, Bld: 101 mg/dL — ABNORMAL HIGH (ref 70–99)
Potassium: 4.5 mEq/L (ref 3.5–5.1)
Sodium: 134 mEq/L — ABNORMAL LOW (ref 135–145)

## 2018-06-19 ENCOUNTER — Ambulatory Visit: Payer: Medicare Other | Admitting: Internal Medicine

## 2018-06-19 ENCOUNTER — Encounter: Payer: Self-pay | Admitting: Internal Medicine

## 2018-06-19 VITALS — BP 120/72 | HR 83 | Temp 97.9°F | Resp 18 | Wt 132.4 lb

## 2018-06-19 DIAGNOSIS — Z9109 Other allergy status, other than to drugs and biological substances: Secondary | ICD-10-CM | POA: Diagnosis not present

## 2018-06-19 DIAGNOSIS — N8110 Cystocele, unspecified: Secondary | ICD-10-CM | POA: Diagnosis not present

## 2018-06-19 DIAGNOSIS — I499 Cardiac arrhythmia, unspecified: Secondary | ICD-10-CM | POA: Diagnosis not present

## 2018-06-19 DIAGNOSIS — I10 Essential (primary) hypertension: Secondary | ICD-10-CM | POA: Diagnosis not present

## 2018-06-19 DIAGNOSIS — E7800 Pure hypercholesterolemia, unspecified: Secondary | ICD-10-CM

## 2018-06-19 DIAGNOSIS — F439 Reaction to severe stress, unspecified: Secondary | ICD-10-CM

## 2018-06-19 DIAGNOSIS — E871 Hypo-osmolality and hyponatremia: Secondary | ICD-10-CM | POA: Diagnosis not present

## 2018-06-19 MED ORDER — MAGNESIUM OXIDE 400 MG PO TABS: 400.0000 mg | ORAL_TABLET | Freq: Every day | ORAL | 1 refills | Status: DC

## 2018-06-19 NOTE — Progress Notes (Signed)
Patient ID: Elizabeth HaymakerColleen C [name], female   DOB: 07/17/1927, 84 y.o.   MRN: [id number]   Subjective:    Patient ID: Elizabeth Haymakerolleen C [name], female    DOB: 09/16/1927, 84 y.o.   MRN: [id number]  HPI  Patient here for a scheduled follow up.  She reports she is doing relatively well.  Still coping with her husband's death.  Overall she feels she is handling things relatively well.  Some days better than others.  Tries to stay active.  Does not get out as much. No chest pain.  No sob.  Still feels occasional increased heart rate/palpitations.  Discussed with her today.  Has been worked up previously for this.  Discussed reevaluation including event monitor,etc.  She declines.  Wants to monitor.  No abdominal pain.  Bowels moving.     Past Medical History:  Diagnosis Date  . Cystocele or rectocele with incomplete uterine prolapse   . Hyperlipidemia   . Hypertension   . Irritable bowel syndrome   . Vaginal atrophy    Past Surgical History:  Procedure Laterality Date  . CATARACT EXTRACTION  2006   Family History  Problem Relation Age of Onset  . Transient ischemic attack Mother        multiple   . Dementia Mother   . Stroke Father   . Heart disease Brother        MI - died age 82  . Leukemia Brother   . Lung cancer Brother   . Throat cancer Brother        had heart disease also  . Heart disease Sister   . Dementia Sister    Social History   Socioeconomic History  . Marital status: Married    Spouse name: Not on file  . Number of children: 2  . Years of education: Not on file  . Highest education level: Not on file  Occupational History  . Not on file  Social Needs  . Financial resource strain: Not on file  . Food insecurity:    Worry: Not on file    Inability: Not on file  . Transportation needs:    Medical: Not on file    Non-medical: Not on file  Tobacco Use  . Smoking status: Never Smoker  . Smokeless tobacco: Never Used  Substance and Sexual Activity  . Alcohol use: No   Alcohol/week: 0.0 oz  . Drug use: No  . Sexual activity: Not Currently    Birth control/protection: None  Lifestyle  . Physical activity:    Days per week: Not on file    Minutes per session: Not on file  . Stress: Not on file  Relationships  . Social connections:    Talks on phone: Not on file    Gets together: Not on file    Attends religious service: Not on file    Active member of club or organization: Not on file    Attends meetings of clubs or organizations: Not on file    Relationship status: Not on file  Other Topics Concern  . Not on file  Social History Narrative  . Not on file    Outpatient Encounter Medications as of 06/19/2018  Medication Sig  . aspirin 81 MG tablet Take 81 mg by mouth daily.  . cetirizine (ZYRTEC) 10 MG tablet Take 10 mg by mouth daily as needed.   . Cholecalciferol (VITAMIN D-3) 1000 UNITS CAPS Take by mouth.  . estradiol (ESTRACE) 0.1 MG/GM vaginal cream PLACE 1  APPLICATORFUL VAGINALLY 2 TIMES A WEEK  . hydrALAZINE (APRESOLINE) 25 MG tablet Take 1 tablet (25 mg total) by mouth 3 (three) times daily as needed.  Marland Kitchen LIPITOR 40 MG tablet TAKE 1 TABLET BY MOUTH ONCE DAILY  . propranolol (INDERAL) 10 MG tablet Take 1 tablet (10 mg total) by mouth 3 (three) times daily as needed.  . quinapril (ACCUPRIL) 40 MG tablet Take 1 tablet (40 mg total) by mouth daily.  Marland Kitchen triamcinolone (NASACORT) 55 MCG/ACT nasal inhaler Place 2 sprays into the nose daily as needed.   . magnesium oxide (MAG-OX) 400 MG tablet Take 1 tablet (400 mg total) by mouth daily.   No facility-administered encounter medications on file as of 06/19/2018.     Review of Systems  Constitutional: Negative for appetite change and unexpected weight change.  HENT: Negative for congestion and sinus pressure.   Respiratory: Negative for cough, chest tightness and shortness of breath.   Cardiovascular: Positive for palpitations. Negative for chest pain and leg swelling.  Gastrointestinal: Negative  for abdominal pain, diarrhea, nausea and vomiting.  Genitourinary: Negative for difficulty urinating and dysuria.  Musculoskeletal: Negative for joint swelling and myalgias.  Skin: Negative for color change and rash.  Neurological: Negative for dizziness, light-headedness and headaches.  Psychiatric/Behavioral: Negative for agitation and dysphoric mood.       Objective:    Physical Exam  Constitutional: She appears well-developed and well-nourished. No distress.  HENT:  Nose: Nose normal.  Mouth/Throat: Oropharynx is clear and moist.  Neck: Neck supple. No thyromegaly present.  Cardiovascular: Normal rate and regular rhythm.  Pulmonary/Chest: Breath sounds normal. No respiratory distress. She has no wheezes.  Abdominal: Soft. Bowel sounds are normal. There is no tenderness.  Musculoskeletal: She exhibits no edema or tenderness.  Lymphadenopathy:    She has no cervical adenopathy.  Skin: No rash noted. No erythema.  Psychiatric: She has a normal mood and affect. Her behavior is normal.    BP 120/72 (BP Location: Left Arm, Patient Position: Sitting, Cuff Size: Normal)   Pulse 83   Temp 97.9 F (36.6 C) (Oral)   Resp 18   Wt 132 lb 6.4 oz (60.1 kg)   SpO2 96%   BMI 25.02 kg/m  Wt Readings from Last 3 Encounters:  06/19/18 132 lb 6.4 oz (60.1 kg)  06/11/18 133 lb (60.3 kg)  05/28/18 132 lb 12.8 oz (60.2 kg)     Lab Results  Component Value Date   WBC 7.3 02/10/2018   HGB 14.6 02/10/2018   HCT 44.3 02/10/2018   PLT 245.0 02/10/2018   GLUCOSE 101 (H) 06/17/2018   CHOL 185 06/17/2018   TRIG 85.0 06/17/2018   HDL 57.10 06/17/2018   LDLDIRECT 178.4 11/04/2013   LDLCALC 111 (H) 06/17/2018   ALT 12 06/17/2018   AST 15 06/17/2018   NA 134 (L) 06/17/2018   K 4.5 06/17/2018   CL 101 06/17/2018   CREATININE 1.10 06/17/2018   BUN 24 (H) 06/17/2018   CO2 25 06/17/2018   TSH 3.78 02/10/2018       Assessment & Plan:   Problem List Items Addressed This Visit     Arrhythmia    Previously saw Dr Mariah Milling.  Previously felt to be related to pvc's/pac's.  Some increased heart rate and palpitations as outlined.  Declines any further evaluation.        Baden-Walker grade 4 cystocele    Followed by Dr Valentino Saxon.  She changes her pessary.  Trying different pessaries.  Environmental allergies    Controlled on current regimen.        Hypercholesterolemia    On lipitor.  Low cholesterol diet exercise.  Follow lipid panel and liver function tests.        Hypertension    Blood pressure under good control.  Continue same medication regimen.  Follow pressures.  Follow metabolic panel.        Stress    Increased stress as outlined.  Discussed with her today.  She does not feel needs any further intervention.  Follow.         Other Visit Diagnoses    Hyponatremia    -  Primary   Relevant Orders   Sodium       [place], MD

## 2018-06-22 ENCOUNTER — Encounter: Payer: Self-pay | Admitting: Internal Medicine

## 2018-06-22 NOTE — Assessment & Plan Note (Signed)
Increased stress as outlined.  Discussed with her today.  She does not feel needs any further intervention.  Follow.   

## 2018-06-22 NOTE — Assessment & Plan Note (Signed)
Previously saw Dr Mariah MillingGollan.  Previously felt to be related to pvc's/pac's.  Some increased heart rate and palpitations as outlined.  Declines any further evaluation.

## 2018-06-22 NOTE — Assessment & Plan Note (Signed)
Controlled on current regimen.   

## 2018-06-22 NOTE — Assessment & Plan Note (Signed)
On lipitor.  Low cholesterol diet exercise.  Follow lipid panel and liver function tests.

## 2018-06-22 NOTE — Assessment & Plan Note (Signed)
Followed by Dr Valentino Saxonherry.  She changes her pessary.  Trying different pessaries.

## 2018-06-22 NOTE — Assessment & Plan Note (Signed)
Blood pressure under good control.  Continue same medication regimen.  Follow pressures.  Follow metabolic panel.   

## 2018-07-05 ENCOUNTER — Other Ambulatory Visit: Payer: Self-pay | Admitting: Internal Medicine

## 2018-07-26 ENCOUNTER — Other Ambulatory Visit: Payer: Self-pay | Admitting: Internal Medicine

## 2018-08-06 ENCOUNTER — Encounter: Payer: Medicare Other | Admitting: Obstetrics and Gynecology

## 2018-08-08 ENCOUNTER — Ambulatory Visit (INDEPENDENT_AMBULATORY_CARE_PROVIDER_SITE_OTHER): Payer: Medicare Other | Admitting: Obstetrics and Gynecology

## 2018-08-08 ENCOUNTER — Encounter: Payer: Self-pay | Admitting: Obstetrics and Gynecology

## 2018-08-08 VITALS — BP 110/59 | HR 87 | Ht 61.5 in | Wt 134.9 lb

## 2018-08-08 DIAGNOSIS — K5900 Constipation, unspecified: Secondary | ICD-10-CM | POA: Diagnosis not present

## 2018-08-08 DIAGNOSIS — N814 Uterovaginal prolapse, unspecified: Secondary | ICD-10-CM

## 2018-08-08 DIAGNOSIS — Z4689 Encounter for fitting and adjustment of other specified devices: Secondary | ICD-10-CM | POA: Diagnosis not present

## 2018-08-08 DIAGNOSIS — N952 Postmenopausal atrophic vaginitis: Secondary | ICD-10-CM

## 2018-08-08 NOTE — Progress Notes (Signed)
    GYNECOLOGY PROGRESS NOTE  Subjective:    Patient ID: Matilde HaymakerColleen C Mundie, female    DOB: 05/31/1927, 84 y.o.   MRN: [id number]  HPI  Patient is a 84 y.o. 422P2002 female who presents for pessary check.   She denies pelvic discomfort, difficulty urinating.  Denies vaginal bleeding except noted a little spotting today.  Also reports constipation for the past several days.   The following portions of the patient's history were reviewed and updated as appropriate: allergies, current medications, past family history, past medical history, past social history, past surgical history and problem list.   Review of Systems Pertinent items noted in HPI and remainder of comprehensive ROS otherwise negative.  Objective:    Blood pressure (!) 110/59, pulse 87, height 5' 1.5" (1.562 m), weight 134 lb 14.4 oz (61.2 kg). General appearance: alert and no distress Pelvis: The patient's  Size 2 3/4 Gelhorn pessary was removed, cleaned, and re-inserted today.  Speculum examination revealed mildly atrophic vaginal mucosa with no lesions or lacerations.  Small amount of thin watery white-yellow discharge, no odor. Vaginal exam with large mound of stool in the upper rectal vault.    Assessment:   Pessary maintenance Vaginal atrophy Cystocele grade 3, Uterine prolapse Grade 1  Constipation  Plan:   - Pessary maintenance.  -  Continue use of Estrace to once weekly.  Continue use the Trimo-san gel when she needed once every 1-2 weeks.  - Advised on management of constipation. Patient already takes in enough fiber, advised on apple juice/prune juice, or dulcolax OTC.  - Return to clinic in 8 weeks.    Hildred Laserherry, Shanzay Hepworth, MD [facility]

## 2018-08-08 NOTE — Progress Notes (Signed)
Pt stated that pessary is helping a little. No other complaints.

## 2018-08-08 NOTE — Patient Instructions (Signed)

## 2018-08-26 ENCOUNTER — Other Ambulatory Visit (INDEPENDENT_AMBULATORY_CARE_PROVIDER_SITE_OTHER): Payer: Medicare Other

## 2018-08-26 DIAGNOSIS — E871 Hypo-osmolality and hyponatremia: Secondary | ICD-10-CM | POA: Diagnosis not present

## 2018-08-27 ENCOUNTER — Other Ambulatory Visit: Payer: Self-pay | Admitting: Internal Medicine

## 2018-08-27 ENCOUNTER — Other Ambulatory Visit: Payer: Medicare Other

## 2018-08-27 DIAGNOSIS — E7800 Pure hypercholesterolemia, unspecified: Secondary | ICD-10-CM

## 2018-08-27 DIAGNOSIS — I10 Essential (primary) hypertension: Secondary | ICD-10-CM

## 2018-08-27 LAB — SODIUM: Sodium: 132 mEq/L — ABNORMAL LOW (ref 135–145)

## 2018-08-27 NOTE — Progress Notes (Signed)
Order placed for f/u labs.  

## 2018-10-02 ENCOUNTER — Encounter: Payer: Medicare Other | Admitting: Obstetrics and Gynecology

## 2018-10-06 ENCOUNTER — Telehealth: Payer: Self-pay | Admitting: Internal Medicine

## 2018-10-06 NOTE — Telephone Encounter (Signed)
Pt needs a refill on ACCUPRIL 40 MG tablet sent to [facility]  Pt is completely out

## 2018-10-07 ENCOUNTER — Encounter: Payer: Self-pay | Admitting: *Deleted

## 2018-10-07 ENCOUNTER — Ambulatory Visit (INDEPENDENT_AMBULATORY_CARE_PROVIDER_SITE_OTHER): Payer: Medicare Other | Admitting: Obstetrics and Gynecology

## 2018-10-07 ENCOUNTER — Encounter: Payer: Self-pay | Admitting: Obstetrics and Gynecology

## 2018-10-07 VITALS — BP 108/67 | HR 82 | Ht 61.5 in | Wt 133.7 lb

## 2018-10-07 DIAGNOSIS — N952 Postmenopausal atrophic vaginitis: Secondary | ICD-10-CM | POA: Diagnosis not present

## 2018-10-07 DIAGNOSIS — N814 Uterovaginal prolapse, unspecified: Secondary | ICD-10-CM | POA: Diagnosis not present

## 2018-10-07 DIAGNOSIS — Z4689 Encounter for fitting and adjustment of other specified devices: Secondary | ICD-10-CM | POA: Diagnosis not present

## 2018-10-07 NOTE — Progress Notes (Signed)
    GYNECOLOGY PROGRESS NOTE  Subjective:    Patient ID: Elizabeth [name], female    DOB: 1927-12-01, 84 y.o.   MRN: [id number]  HPI  Patient is a 84 y.o. G2P2002 female who 84 y.o. G2P2002 female who presents for pessary check.   She denies pelvic discomfort, difficulty urinating.  Overall doing well today.    The following portions of the patient's history were reviewed and updated as appropriate: allergies, current medications, past family history, past medical history, past social history, past surgical history and problem list.   Review of Systems Pertinent items noted in HPI and remainder of comprehensive ROS otherwise negative.  Objective:    Blood pressure 108/67, pulse 82, height 5' 1.5" (1.562 m), weight 133 lb 11.2 oz (60.6 kg). General appearance: alert and no distress Pelvis: The patient's  Size 2 3/4 Gelhorn pessary was removed, cleaned, and re-inserted today.  Speculum examination revealed mildly atrophic vaginal mucosa with no lesions or lacerations.  Scant amount of thin watery white-yellow discharge, no odor.   Assessment:   Pessary maintenance Vaginal atrophy Cystocele grade 3, Uterine prolapse Grade 1   Plan:   - Pessary maintenance.  -  Continue use of Estrace to once weekly.  Continue use the Trimo-san gel when she needed once every 1-2 weeks.  - Return to clinic in 8-10 weeks.    Hildred Laser, MD [facility]

## 2018-10-07 NOTE — Telephone Encounter (Signed)
Sent mychart message

## 2018-10-07 NOTE — Telephone Encounter (Signed)
Pt should still have a 90 day supply refill left.

## 2018-10-07 NOTE — Progress Notes (Signed)
Pt is present today for a pessary check. Pt stated that the pessary is working no other problems.

## 2018-10-08 ENCOUNTER — Telehealth: Payer: Self-pay | Admitting: Internal Medicine

## 2018-10-08 ENCOUNTER — Other Ambulatory Visit: Payer: Self-pay

## 2018-10-08 MED ORDER — ACCUPRIL 40 MG PO TABS: 40.0000 mg | ORAL_TABLET | Freq: Every day | ORAL | 1 refills | Status: DC

## 2018-10-08 NOTE — Telephone Encounter (Signed)
Called [facility] to have them send me a request for PA.

## 2018-10-08 NOTE — Telephone Encounter (Signed)
Copied from CRM [id number]. Topic: Quick Communication - See Telephone Encounter >> Oct 08, 2018  2:11 PM Windy Kalata, NT wrote: CRM for notification. See Telephone encounter for: 10/08/18.  Wynona Canes is calling from [facility] and states that ACCUPRIL 40 MG tablet  is needing a prior authorization. They are unable to fill it until then.

## 2018-10-15 ENCOUNTER — Other Ambulatory Visit (INDEPENDENT_AMBULATORY_CARE_PROVIDER_SITE_OTHER): Payer: Medicare Other

## 2018-10-15 DIAGNOSIS — I10 Essential (primary) hypertension: Secondary | ICD-10-CM

## 2018-10-15 DIAGNOSIS — E7800 Pure hypercholesterolemia, unspecified: Secondary | ICD-10-CM

## 2018-10-15 LAB — LIPID PANEL
CHOLESTEROL: 170 mg/dL (ref 0–200)
HDL: 57 mg/dL (ref >39.00)
LDL CALC: 92 mg/dL (ref 0–99)
NonHDL: 112.72
TRIGLYCERIDES: 103 mg/dL (ref 0.0–149.0)
Total CHOL/HDL Ratio: 3
VLDL: 20.6 mg/dL (ref 0.0–40.0)

## 2018-10-15 LAB — HEPATIC FUNCTION PANEL
ALBUMIN: 4.4 g/dL (ref 3.5–5.2)
ALT: 18 U/L (ref 0–35)
AST: 18 U/L (ref 0–37)
Alkaline Phosphatase: 63 U/L (ref 39–117)
Bilirubin, Direct: 0.1 mg/dL (ref 0.0–0.3)
TOTAL PROTEIN: 6.8 g/dL (ref 6.0–8.3)
Total Bilirubin: 0.4 mg/dL (ref 0.2–1.2)

## 2018-10-15 LAB — BASIC METABOLIC PANEL
BUN: 19 mg/dL (ref 6–23)
CHLORIDE: 101 meq/L (ref 96–112)
CO2: 24 mEq/L (ref 19–32)
Calcium: 9.2 mg/dL (ref 8.4–10.5)
Creatinine, Ser: 1.04 mg/dL (ref 0.40–1.20)
GFR: 52.95 mL/min — AB (ref >60.00)
Glucose, Bld: 107 mg/dL — ABNORMAL HIGH (ref 70–99)
POTASSIUM: 4.6 meq/L (ref 3.5–5.1)
SODIUM: 133 meq/L — AB (ref 135–145)

## 2018-10-16 ENCOUNTER — Encounter: Payer: Self-pay | Admitting: Internal Medicine

## 2018-10-27 ENCOUNTER — Other Ambulatory Visit: Payer: Self-pay | Admitting: Obstetrics and Gynecology

## 2018-10-27 DIAGNOSIS — N952 Postmenopausal atrophic vaginitis: Secondary | ICD-10-CM

## 2018-11-04 ENCOUNTER — Ambulatory Visit (INDEPENDENT_AMBULATORY_CARE_PROVIDER_SITE_OTHER): Payer: Medicare Other

## 2018-11-04 ENCOUNTER — Ambulatory Visit (INDEPENDENT_AMBULATORY_CARE_PROVIDER_SITE_OTHER): Payer: Medicare Other | Admitting: Internal Medicine

## 2018-11-04 ENCOUNTER — Encounter: Payer: Self-pay | Admitting: Internal Medicine

## 2018-11-04 VITALS — BP 130/78 | HR 91 | Temp 97.5°F | Resp 18 | Wt 137.2 lb

## 2018-11-04 DIAGNOSIS — I10 Essential (primary) hypertension: Secondary | ICD-10-CM

## 2018-11-04 DIAGNOSIS — Z23 Encounter for immunization: Secondary | ICD-10-CM | POA: Diagnosis not present

## 2018-11-04 DIAGNOSIS — E7800 Pure hypercholesterolemia, unspecified: Secondary | ICD-10-CM

## 2018-11-04 DIAGNOSIS — R000 Tachycardia, unspecified: Secondary | ICD-10-CM

## 2018-11-04 DIAGNOSIS — Z9109 Other allergy status, other than to drugs and biological substances: Secondary | ICD-10-CM | POA: Diagnosis not present

## 2018-11-04 DIAGNOSIS — R42 Dizziness and giddiness: Secondary | ICD-10-CM

## 2018-11-04 DIAGNOSIS — R0602 Shortness of breath: Secondary | ICD-10-CM

## 2018-11-04 DIAGNOSIS — K219 Gastro-esophageal reflux disease without esophagitis: Secondary | ICD-10-CM

## 2018-11-04 NOTE — Patient Instructions (Signed)
pepcid 20mg - take one tablet 30 minutes before breakfast 

## 2018-11-04 NOTE — Progress Notes (Signed)
Patient ID: Elizabeth [name], female   DOB: Jan 09, 1927, 84 y.o.   MRN: [id number]   Subjective:    Patient ID: Elizabeth [name], female    DOB: [birth year], 84 y.o.   MRN: [id number]  HPI  Patient here for a scheduled follow up.  She reports several concerns today.  Still tries to stay active.  Has noticed being more sob with activity.  Has had issues with palpitations and increased heart rate for years.  Noticed more recently.  No chest pain, but does report getting more sob with exertion.  Some acid reflux.  Has previously been on zantac.  Feels more unsteady.  Some congestion.  Discussed using saline nasal rinses and nasacort.  No cough.  No chest congestion.  No abdominal pain.  Bowels moving.  Persistent issues with vertigo.  Feels unsteady.  States when she bends over or is in certain positions, will feel like she is going to fall or will feel a little dizzy.     Past Medical History:  Diagnosis Date  . Cystocele or rectocele with incomplete uterine prolapse   . Hyperlipidemia   . Hypertension   . Irritable bowel syndrome   . Vaginal atrophy    Past Surgical History:  Procedure Laterality Date  . CATARACT EXTRACTION  2006   Family History  Problem Relation Age of Onset  . Transient ischemic attack Mother        multiple   . Dementia Mother   . Stroke Father   . Heart disease Brother        MI - died age 64  . Leukemia Brother   . Lung cancer Brother   . Throat cancer Brother        had heart disease also  . Heart disease Sister   . Dementia Sister    Social History   Socioeconomic History  . Marital status: Married    Spouse name: Not on file  . Number of children: 2  . Years of education: Not on file  . Highest education level: Not on file  Occupational History  . Not on file  Social Needs  . Financial resource strain: Not on file  . Food insecurity:    Worry: Not on file    Inability: Not on file  . Transportation needs:    Medical: Not on file    Non-medical: Not  on file  Tobacco Use  . Smoking status: Never Smoker  . Smokeless tobacco: Never Used  Substance and Sexual Activity  . Alcohol use: No    Alcohol/week: 0.0 standard drinks  . Drug use: No  . Sexual activity: Not Currently    Birth control/protection: None  Lifestyle  . Physical activity:    Days per week: Not on file    Minutes per session: Not on file  . Stress: Not on file  Relationships  . Social connections:    Talks on phone: Not on file    Gets together: Not on file    Attends religious service: Not on file    Active member of club or organization: Not on file    Attends meetings of clubs or organizations: Not on file    Relationship status: Not on file  Other Topics Concern  . Not on file  Social History Narrative  . Not on file    Outpatient Encounter Medications as of 11/04/2018  Medication Sig  . ACCUPRIL 40 MG tablet Take 1 tablet (40 mg total) by mouth daily.  Marland Kitchen  aspirin 81 MG tablet Take 81 mg by mouth daily.  . cetirizine (ZYRTEC) 10 MG tablet Take 10 mg by mouth daily as needed.   . Cholecalciferol (VITAMIN D-3) 1000 UNITS CAPS Take by mouth.  . estradiol (ESTRACE) 0.1 MG/GM vaginal cream PLACE 1 APPLICATORFUL VAGINALLY 2 TIMES A WEEK  . hydrALAZINE (APRESOLINE) 25 MG tablet Take 1 tablet (25 mg total) by mouth 3 (three) times daily as needed.  Marland Kitchen LIPITOR 40 MG tablet TAKE 1 TABLET BY MOUTH ONCE DAILY  . magnesium oxide (MAG-OX) 400 (241.3 Mg) MG tablet TAKE 1 TABLET BY MOUTH ONCE DAILY  . propranolol (INDERAL) 10 MG tablet Take 1 tablet (10 mg total) by mouth 3 (three) times daily as needed.  . triamcinolone (NASACORT) 55 MCG/ACT nasal inhaler Place 2 sprays into the nose daily as needed.    No facility-administered encounter medications on file as of 11/04/2018.     Review of Systems  Constitutional: Negative for appetite change and unexpected weight change.  HENT: Positive for congestion and sinus pressure.   Respiratory: Positive for shortness of  breath. Negative for cough and chest tightness.        SOB with exertion.    Cardiovascular: Negative for chest pain and palpitations.       Some ankle swelling.    Gastrointestinal: Negative for abdominal pain, diarrhea, nausea and vomiting.       Acid reflux.    Genitourinary: Negative for difficulty urinating and dysuria.  Musculoskeletal: Negative for joint swelling and myalgias.  Skin: Negative for color change and rash.  Neurological: Positive for dizziness and light-headedness.       No significant headache.    Psychiatric/Behavioral: Negative for agitation and dysphoric mood.       Objective:    Physical Exam  Constitutional: She appears well-developed and well-nourished. No distress.  HENT:  Nose: Nose normal.  Mouth/Throat: Oropharynx is clear and moist.  TMs without erythema.   Neck: Neck supple. No thyromegaly present.  Cardiovascular: Normal rate and regular rhythm.  Pulmonary/Chest: Breath sounds normal. No respiratory distress. She has no wheezes.  Abdominal: Soft. Bowel sounds are normal. There is no tenderness.  Musculoskeletal: She exhibits no edema or tenderness.  Lymphadenopathy:    She has no cervical adenopathy.  Skin: No rash noted. No erythema.  Psychiatric: She has a normal mood and affect. Her behavior is normal.    BP 130/78 (BP Location: Left Arm, Patient Position: Sitting, Cuff Size: Normal)   Pulse 91   Temp (!) 97.5 F (36.4 C) (Oral)   Resp 18   Wt 137 lb 3.2 oz (62.2 kg)   SpO2 97%   BMI 25.50 kg/m  Wt Readings from Last 3 Encounters:  11/04/18 137 lb 3.2 oz (62.2 kg)  10/07/18 133 lb 11.2 oz (60.6 kg)  08/08/18 134 lb 14.4 oz (61.2 kg)     Lab Results  Component Value Date   WBC 7.3 02/10/2018   HGB 14.6 02/10/2018   HCT 44.3 02/10/2018   PLT 245.0 02/10/2018   GLUCOSE 107 (H) 10/15/2018   CHOL 170 10/15/2018   TRIG 103.0 10/15/2018   HDL 57.00 10/15/2018   LDLDIRECT 178.4 11/04/2013   LDLCALC 92 10/15/2018   ALT 18  10/15/2018   AST 18 10/15/2018   NA 133 (L) 10/15/2018   K 4.6 10/15/2018   CL 101 10/15/2018   CREATININE 1.04 10/15/2018   BUN 19 10/15/2018   CO2 24 10/15/2018   TSH 3.78 02/10/2018  Assessment & Plan:   Problem List Items Addressed This Visit    Dizziness    Persistent intermittent issue.  Noticed more recently.  States has "vertigo".  Certain movements and position changes aggravate.  Discussed using saline nasal spray and nasacort nasal spray as directed.  Refer to ENT for evaluation.        Relevant Orders   Ambulatory referral to ENT   Environmental allergies    Treat with saline nasal spray and nasacort as outlined.  Takes zyrtec.  Follow.        GERD (gastroesophageal reflux disease)    Was on zantac.  Increased acid reflux.  Start pepcid 20mg  q day. Follow.        Hypercholesterolemia    On lipitor.  Low cholesterol diet and exercise.  Follow lipid panel and liver function tests.        Hypertension    Blood pressure as outlined.  Running a little higher recently.  Outside checks averaging 130-140s/70s.  Treat above.  Follow pressures.  If persistent elevation, will require additional medication.        SOB (shortness of breath)    Describes sob with exertion.  EKG - SR with no acute ischemic changes.  Discussed further cardiac evaluation given worsening symptoms.  She is in agreement.        Relevant Orders   Ambulatory referral to Cardiology   Tachycardia    Has seen cardiology previously.  Feels symptoms have worsened.  SOB with exertion now.  Refer back to cardiology for further evaluation.        Relevant Orders   Ambulatory referral to Cardiology    Other Visit Diagnoses    SOB (shortness of breath) on exertion    -  Primary   Relevant Orders   EKG 12-Lead (Completed)   DG Chest 2 View (Completed)   Encounter for immunization       Relevant Orders   Flu vaccine HIGH DOSE PF (Completed)     I spent 40 minutes with the patient and more  than 50% of the time was spent in consultation regarding the above.  Time spent discussing her current concerns and symptoms.  Time also spent discussing further evaluation and treatment.    Dale [place], MD

## 2018-11-05 ENCOUNTER — Encounter: Payer: Self-pay | Admitting: Internal Medicine

## 2018-11-09 ENCOUNTER — Encounter: Payer: Self-pay | Admitting: Internal Medicine

## 2018-11-09 DIAGNOSIS — K219 Gastro-esophageal reflux disease without esophagitis: Secondary | ICD-10-CM | POA: Insufficient documentation

## 2018-11-09 NOTE — Assessment & Plan Note (Signed)
Describes sob with exertion.  EKG - SR with no acute ischemic changes.  Discussed further cardiac evaluation given worsening symptoms.  She is in agreement.

## 2018-11-09 NOTE — Assessment & Plan Note (Signed)
Treat with saline nasal spray and nasacort as outlined.  Takes zyrtec.  Follow.

## 2018-11-09 NOTE — Assessment & Plan Note (Signed)
Blood pressure as outlined.  Running a little higher recently.  Outside checks averaging 130-140s/70s.  Treat above.  Follow pressures.  If persistent elevation, will require additional medication.

## 2018-11-09 NOTE — Assessment & Plan Note (Signed)
Persistent intermittent issue.  Noticed more recently.  States has "vertigo".  Certain movements and position changes aggravate.  Discussed using saline nasal spray and nasacort nasal spray as directed.  Refer to ENT for evaluation.

## 2018-11-09 NOTE — Assessment & Plan Note (Signed)
On lipitor.  Low cholesterol diet and exercise.  Follow lipid panel and liver function tests.   

## 2018-11-09 NOTE — Assessment & Plan Note (Signed)
Was on zantac.  Increased acid reflux.  Start pepcid 20mg  q day. Follow.

## 2018-11-09 NOTE — Assessment & Plan Note (Signed)
Has seen cardiology previously.  Feels symptoms have worsened.  SOB with exertion now.  Refer back to cardiology for further evaluation.

## 2018-11-12 ENCOUNTER — Encounter: Payer: Self-pay | Admitting: Physician Assistant

## 2018-11-12 NOTE — Progress Notes (Signed)
Cardiology Office Note Date:  11/13/2018  Elizabeth [name] ID:  Elizabeth [name], DOB Jun 03, 1927, MRN [id number] PCP:  Dale [place], MD  Cardiologist:  Dr. Mariah Milling, MD    Chief Complaint: SOB/palpitations   History of Present Illness: Elizabeth [name] is a 84 y.o. female with history of palpitations, HTN, HLD, hyponatremia, vertigo, and IBS who presents for evaluation of palpitations and SOB.  Per notes, Elizabeth [name] previously underwent stress testing that was self reported as normal, echo for a murmur that showed valvular calcification, and Holter that was relatively unrevealing. Elizabeth [name] has reported a long history of palpitations for > 20 years. Elizabeth [name] was last evaluated by Dr. Mariah Milling on 07/26/2016 for follow up of palpitations that were felt to be secondary to PACs/PVCs. Elizabeth [name] was placed on prn propranolol.   Elizabeth [name] was recently seen by PCP on 11/04/2018 with exertional SOB and increase in palpitations and well as a general unsteadiness. EKG at PCP office showed NSR, 90 bpm, left axis deviation, low voltage along the precordial leads, unable to exclude prior anterior infarct. CXR showed no active cardiopulmonary process.   Labs: 09/2018 - LFT normal, LDL 92, sodium 133, potassium 4.6, glucose 107, SCr 1.04 01/2018 - CBC unremarkable, TSH normal  Elizabeth [name] comes in noting an ~ 12 month history of SOB that occurs randomly and is not associated with exertion. Elizabeth [name] indicates Elizabeth [name] can be sitting done eating a sandwich and feel like Elizabeth [name] needs to take a couple of deep breaths. When Elizabeth [name] is ambulating or exerting herself Elizabeth [name] is asymptomatic. Elizabeth [name] feels like her palpitations are slightly more frequent, though notes these also are not related with exertion and are occurring at rest. Elizabeth [name] continues to appropriately grieve the loss of her husband 6 months prior. Elizabeth [name] notes in this setting Elizabeth [name] has done a lot more sitting and in general lives a more sedentary lifestyle. With this, Elizabeth [name] is uncertain if Elizabeth [name] is just more aware of her  palpitations and SOB as Elizabeth [name] is now not distracted by as many errands. Elizabeth [name] denies any chest pain. Elizabeth [name] has had some positional dizziness, for example Elizabeth [name] will lean over to tie her shoes and upon sitting upright Elizabeth [name] will become dizzy briefly. Elizabeth [name] has a history of vertigo, though states this does not feel like her vertigo. PCP has scheduled Elizabeth [name] to be evaluated by ENT.     Past Medical History:  Diagnosis Date  . Cystocele or rectocele with incomplete uterine prolapse   . Hyperlipidemia   . Hypertension   . Irritable bowel syndrome   . Palpitations   . Vaginal atrophy     Past Surgical History:  Procedure Laterality Date  . CATARACT EXTRACTION  2006    Current Meds  Medication Sig  . ACCUPRIL 40 MG tablet Take 1 tablet (40 mg total) by mouth daily.  Marland Kitchen aspirin 81 MG tablet Take 81 mg by mouth daily.  . cetirizine (ZYRTEC) 10 MG tablet Take 10 mg by mouth daily as needed.   . Cholecalciferol (VITAMIN D-3) 1000 UNITS CAPS Take by mouth.  . estradiol (ESTRACE) 0.1 MG/GM vaginal cream PLACE 1 APPLICATORFUL VAGINALLY 2 TIMES A WEEK  . hydrALAZINE (APRESOLINE) 25 MG tablet Take 1 tablet (25 mg total) by mouth 3 (three) times daily as needed.  Marland Kitchen LIPITOR 40 MG tablet TAKE 1 TABLET BY MOUTH ONCE DAILY  . magnesium oxide (MAG-OX) 400 (241.3 Mg) MG tablet TAKE 1 TABLET BY MOUTH ONCE DAILY  . propranolol (INDERAL) 10 MG tablet Take 1 tablet (10 mg  total) by mouth 3 (three) times daily as needed.  . triamcinolone (NASACORT) 55 MCG/ACT nasal inhaler Place 2 sprays into the nose daily as needed.     Allergies:   Elizabeth [name] has no known allergies.   Social History:  The Elizabeth [name]  reports that Elizabeth [name] has never smoked. Elizabeth [name] has never used smokeless tobacco. Elizabeth [name] reports that Elizabeth [name] does not drink alcohol or use drugs.   Family History:  The Elizabeth [name]'s family history includes Dementia in her mother and sister; Heart disease in her brother and sister; Leukemia in her brother; Lung cancer in her brother; Stroke in  her father; Throat cancer in her brother; Transient ischemic attack in her mother.  ROS:   Review of Systems  Constitutional: Positive for malaise/fatigue. Negative for chills, diaphoresis, fever and weight loss.  HENT: Negative for congestion.   Eyes: Negative for discharge and redness.  Respiratory: Positive for shortness of breath. Negative for cough, hemoptysis, sputum production and wheezing.   Cardiovascular: Positive for palpitations and leg swelling. Negative for chest pain, orthopnea, claudication and PND.  Gastrointestinal: Negative for abdominal pain, blood in stool, heartburn, melena, nausea and vomiting.  Genitourinary: Negative for hematuria.  Musculoskeletal: Negative for falls and myalgias.  Skin: Negative for rash.  Neurological: Positive for dizziness and weakness. Negative for tingling, tremors, sensory change, speech change, focal weakness and loss of consciousness.  Endo/Heme/Allergies: Does not bruise/bleed easily.  Psychiatric/Behavioral: Positive for depression. Negative for substance abuse. The Elizabeth [name] is nervous/anxious.   All other systems reviewed and are negative.    PHYSICAL EXAM:  VS:  BP 120/60 (BP Location: Left Arm, Elizabeth [name] Position: Sitting, Cuff Size: Normal)   Pulse 97   Ht 5\' 1"  (1.549 m)   Wt 135 lb 4 oz (61.3 kg)   BMI 25.56 kg/m  BMI: Body mass index is 25.56 kg/m.  Physical Exam  Constitutional: Elizabeth [name] is oriented to person, place, and time. Elizabeth [name] appears well-developed and well-nourished.  HENT:  Head: Normocephalic and atraumatic.  Eyes: Right eye exhibits no discharge. Left eye exhibits no discharge.  Neck: Normal range of motion. No JVD present.  Cardiovascular: Normal rate, regular rhythm, S1 normal and S2 normal. Exam reveals no distant heart sounds, no friction rub, no midsystolic click and no opening snap.  Murmur heard. High-pitched blowing decrescendo early diastolic murmur is present with a grade of 1/6 at the upper right sternal  border radiating to the apex. Pulses:      Posterior tibial pulses are 2+ on the right side, and 2+ on the left side.  Pulmonary/Chest: Effort normal and breath sounds normal. No respiratory distress. Elizabeth [name] has no decreased breath sounds. Elizabeth [name] has no wheezes. Elizabeth [name] has no rales. Elizabeth [name] exhibits no tenderness.  Abdominal: Soft. Elizabeth [name] exhibits no distension. There is no tenderness.  Musculoskeletal: Elizabeth [name] exhibits edema.  Trace bilateral ankle edema  Neurological: Elizabeth [name] is alert and oriented to person, place, and time.  Skin: Skin is warm and dry. No cyanosis. Nails show no clubbing.  Psychiatric: Elizabeth [name] has a normal mood and affect. Her speech is normal and behavior is normal. Judgment and thought content normal.     EKG:  Was ordered and interpreted by me today. Shows NSR, 97 bpm, left axis deviation, possible prior inferior and anterior infarcts, nonspecific st/t changes  Recent Labs: 02/10/2018: Hemoglobin 14.6; Platelets 245.0; TSH 3.78 10/15/2018: ALT 18; BUN 19; Creatinine, Ser 1.04; Potassium 4.6; Sodium 133  10/15/2018: Cholesterol 170; HDL 57.00; LDL Cholesterol 92; Total CHOL/HDL Ratio 3; Triglycerides 103.0; VLDL  20.6   CrCl cannot be calculated (Elizabeth [name]'s most recent lab result is older than the maximum 21 days allowed.).   Wt Readings from Last 3 Encounters:  11/13/18 135 lb 4 oz (61.3 kg)  11/04/18 137 lb 3.2 oz (62.2 kg)  10/07/18 133 lb 11.2 oz (60.6 kg)     Other studies reviewed: Additional studies/records reviewed today include: summarized above  ASSESSMENT AND PLAN:  1. Dyspnea/lower extremity swelling: Not exertional and occurring when at rest or eating, lasting for few seconds to minutes with spontaneous resolution. Check echo and BNP. If symptoms persist, consider cardiac CT with FFR. Elizabeth [name] does not appear grossly volume up. Her lower extremity swelling appears to be consistent with dependent edema. Recommend leg elevation with consideration of compression stockings if needed.    2. Palpitations/dizziness: Schedule Zio monitor as well as echo as above. Dizziness appears to be positional. Cannot exclude orthostasis. Has a history of vertigo, though Elizabeth [name] notes this does feel different.   3. HTN: Blood pressure is well controlled tolday. Continue quinapril 40 mg daily.  Has not needed any as needed hydralazine.  4. HLD: Recent LDL of 92 from 09/2018 with normal LFT at that time. Remains on Lipitor 40 mg daily.   Disposition: F/u with Dr. Mariah Milling or an APP in 4 weeks.   Current medicines are reviewed at length with the Elizabeth [name] today.  The Elizabeth [name] did not have any concerns regarding medicines.  Signed, Eula Listen, PA-C 11/13/2018 11:15 AM     [facility] [place] [address] [place], [place] [address] [phone]

## 2018-11-13 ENCOUNTER — Encounter: Payer: Self-pay | Admitting: Physician Assistant

## 2018-11-13 ENCOUNTER — Other Ambulatory Visit (INDEPENDENT_AMBULATORY_CARE_PROVIDER_SITE_OTHER): Payer: Medicare Other

## 2018-11-13 ENCOUNTER — Ambulatory Visit (INDEPENDENT_AMBULATORY_CARE_PROVIDER_SITE_OTHER): Payer: Medicare Other | Admitting: Physician Assistant

## 2018-11-13 VITALS — BP 120/60 | HR 97 | Ht 61.0 in | Wt 135.2 lb

## 2018-11-13 DIAGNOSIS — E782 Mixed hyperlipidemia: Secondary | ICD-10-CM

## 2018-11-13 DIAGNOSIS — I872 Venous insufficiency (chronic) (peripheral): Secondary | ICD-10-CM

## 2018-11-13 DIAGNOSIS — R42 Dizziness and giddiness: Secondary | ICD-10-CM

## 2018-11-13 DIAGNOSIS — R0602 Shortness of breath: Secondary | ICD-10-CM | POA: Diagnosis not present

## 2018-11-13 DIAGNOSIS — R002 Palpitations: Secondary | ICD-10-CM

## 2018-11-13 DIAGNOSIS — I498 Other specified cardiac arrhythmias: Secondary | ICD-10-CM

## 2018-11-13 DIAGNOSIS — I10 Essential (primary) hypertension: Secondary | ICD-10-CM

## 2018-11-13 NOTE — Patient Instructions (Addendum)
Medication Instructions:  Your physician recommends that you continue on your current medications as directed. Please refer to the Current Medication list given to you today.  If you need a refill on your cardiac medications before your next appointment, please call your pharmacy.   Lab work: Your physician recommends that you return for lab work in: Today  If you have labs (blood work) drawn today and your tests are completely normal, you will receive your results only by: Marland Kitchen. MyChart Message (if you have MyChart) OR . A paper copy in the mail If you have any lab test that is abnormal or we need to change your treatment, we will call you to review the results.  Testing/Procedures: Your physician has recommended that you wear an event monitor. Event monitors are medical devices that record the heart's electrical activity. Doctors most often us these monitors to diagnose arrhythmias. Arrhythmias are problems with the speed or rhythm of the heartbeat. The monitor is a small, portable device. You can wear one while you do your normal daily activities. This is usually used to diagnose what is causing palpitations/syncope (passing out).  Your physician has requested that you have an echocardiogram. Echocardiography is a painless test that uses sound waves to create images of your heart. It provides your doctor with information about the size and shape of your heart and how well your heart's chambers and valves are working. This procedure takes approximately one hour. There are no restrictions for this procedure.   Follow-Up: At [facility], you and your health needs are our priority.  As part of our continuing mission to provide you with exceptional heart care, we have created designated Provider Care Teams.  These Care Teams include your primary Cardiologist (physician) and Advanced Practice Providers (APPs -  Physician Assistants and Nurse Practitioners) who all work together to provide you with  the care you need, when you need it. You will need a follow up appointment in 1 months.  Please call our office 2 months in advance to schedule this appointment.  You may see Dr. Mariah MillingGollan or one of the following Advanced Practice Providers on your designated Care Team:   Nicolasa Duckinghristopher Berge, NP Eula Listenyan Dunn, PA-C . Marisue IvanJacquelyn Visser, PA-C  Any Other Special Instructions Will Be Listed Below (If Applicable). Thank you for choosing  [facility]!

## 2018-11-14 LAB — BRAIN NATRIURETIC PEPTIDE: BNP: 44.9 pg/mL (ref 0.0–100.0)

## 2018-12-02 ENCOUNTER — Encounter: Payer: Self-pay | Admitting: Obstetrics and Gynecology

## 2018-12-02 ENCOUNTER — Ambulatory Visit (INDEPENDENT_AMBULATORY_CARE_PROVIDER_SITE_OTHER): Payer: Medicare Other | Admitting: Obstetrics and Gynecology

## 2018-12-02 VITALS — BP 103/66 | HR 90 | Ht 61.0 in | Wt 136.0 lb

## 2018-12-02 DIAGNOSIS — N814 Uterovaginal prolapse, unspecified: Secondary | ICD-10-CM

## 2018-12-02 DIAGNOSIS — N952 Postmenopausal atrophic vaginitis: Secondary | ICD-10-CM

## 2018-12-02 DIAGNOSIS — N8110 Cystocele, unspecified: Secondary | ICD-10-CM

## 2018-12-02 DIAGNOSIS — Z4689 Encounter for fitting and adjustment of other specified devices: Secondary | ICD-10-CM

## 2018-12-02 NOTE — Progress Notes (Signed)
Pt is present for pessary check. Pt stated that her pessary is doing well no problems.

## 2018-12-02 NOTE — Progress Notes (Signed)
    GYNECOLOGY PROGRESS NOTE  Subjective:    Patient ID: Elizabeth [name], female    DOB: 11/10/1927, 84 y.o.   MRN: [id number]  HPI  Patient is a 84 y.o. [id number] female who presents for pessary check. Notes that she is doing fairly well today.   She denies pelvic discomfort, difficulty urinating.    The following portions of the patient's history were reviewed and updated as appropriate: allergies, current medications, past family history, past medical history, past social history, past surgical history and problem list.   Review of Systems Pertinent items noted in HPI and remainder of comprehensive ROS otherwise negative.  Objective:    Blood pressure 103/66, pulse 90, height 5\' 1"  (1.549 m), weight 136 lb (61.7 kg). General appearance: alert and no distress Pelvis: The patient's  Size 2 3/4 Gelhorn pessary was removed, cleaned, and re-inserted today.  Speculum examination revealed mildly atrophic vaginal mucosa with no lesions or lacerations.  Scant amount of thin watery white-yellow discharge, no odor.   Assessment:   Pessary maintenance Vaginal atrophy Cystocele grade 3, Uterine prolapse Grade 1   Plan:   - Pessary maintenance.  -  Continue use of estrogen cream once weekly.  Continue use the Trimo-san gel when she needed once every 2-3 weeks.  - Return to clinic in 8-10 weeks.    Hildred Laserherry, Areebah Meinders, MD [facility]

## 2018-12-05 ENCOUNTER — Ambulatory Visit (INDEPENDENT_AMBULATORY_CARE_PROVIDER_SITE_OTHER): Payer: Medicare Other

## 2018-12-05 DIAGNOSIS — R0602 Shortness of breath: Secondary | ICD-10-CM

## 2018-12-08 ENCOUNTER — Telehealth: Payer: Self-pay | Admitting: *Deleted

## 2018-12-08 MED ORDER — METOPROLOL TARTRATE 25 MG PO TABS: 25.0000 mg | ORAL_TABLET | Freq: Two times a day (BID) | ORAL | 2 refills | Status: DC

## 2018-12-08 NOTE — Telephone Encounter (Signed)
Results called to pt. Pt verbalized understanding of results of echo and monitor and plan of care. She is agreeable to stop propranolol (which she says she never took) and start lopressor 25 mg by mouth two times a day. [facility] She will let us know if she has any questions or concerns. Rx sent to pharmacy.

## 2018-12-08 NOTE — Telephone Encounter (Signed)
Result Notes for ECHOCARDIOGRAM COMPLETE   Notes recorded by Creig HinesBerge, Christopher Ronald, NP on 12/05/2018 at 2:33 PM EST Nl heart squeezing function without any significant valvular disease. Reassuring study.

## 2018-12-08 NOTE — Telephone Encounter (Signed)
-----   Message from Sondra Bargesyan M Dunn, PA-C sent at 12/07/2018  2:50 PM EST ----- Heart monitor showed an overall sinus rhythm with an average heart rate of 77 bpm.  59 runs of atrial tachycardia/SVT with the fastest being 5 beats with a max rate of 226 bpm. The longest episode was 16 beats with an average rate of 108 bpm. There were some episodes of SVT with possible aberrancy. Patient triggered events corresponded to SVT. Rare isolated PACs, atrial couplets and triplets as well as rate PVCs, and ventricular couplets.   I recommend we start her on a standing beta blocker if she is agreeable. I would start her on Lopressor 25 mg bid and stop the prn propranolol. Follow up as planned.

## 2018-12-15 NOTE — Progress Notes (Signed)
Cardiology Office Note Date:  12/22/2018  Patient ID:  Elizabeth BurgerColleen C [name], DOB 11/08/1927, MRN [id number] PCP:  Dale DurhamScott, Charlene, MD  Cardiologist:  Dr. Mariah MillingGollan, MD    Chief Complaint: Follow up  History of Present Illness: Elizabeth HaymakerColleen C Serfass is a 84 y.o. female with history of SVT, HTN, HLD, hyponatremia, vertigo, and IBS who presents for follow up of SVT.  Per notes, patient previously underwent stress testing that was self reported as normal, echo for a murmur that showed valvular calcification, and Holter that was relatively unrevealing. She has reported a long history of palpitations for > 20 years. Patient was last evaluated by Dr. Mariah MillingGollan on 07/26/2016 for follow up of palpitations that were felt to be secondary to PACs/PVCs. She was placed on prn propranolol.   She was recently seen by PCP on 11/04/2018 with exertional SOB and increase in palpitations and well as a general unsteadiness. EKG at PCP office showed NSR, 90 bpm, left axis deviation, low voltage along the precordial leads, unable to exclude prior anterior infarct. CXR showed no active cardiopulmonary process.   Labs: 10/2018 - BNP 44.9 09/2018 - LFT normal, LDL 92, sodium 133, potassium 4.6, glucose 107, SCr 1.04 01/2018 - CBC unremarkable, TSH normal  She was seen on 11/21 noting an approximate 12 month history of SOB that occurred randomly and was described as needing to take a couple deep breaths. She was asymptomatic with exertion. She also felt like her palpitations were slightly more frequent. She continued to be more sedentary in the setting of the recent loss of her husband. She underwent echo on 12/05/2018 that showed an EF of 60-65%, normal LV diastolic function, trivial AI, mild MR, left atrium normal in size, RVSF normal, PASP 33 mmHg. Outpatient cardiac monitoring showed an overall sinus rhythm with an average heart rate of 77 bpm. There were 59 atrial tach/SVT runs with the fastest interval lasting 5 beats with a max rate  of 226 bpm and the longest interval lasting 16 beats with an average rate of 108 bpm. There were some episodes of SVT conducted with possible aberrancy. SVT was detected with patient triggered events. In this setting, she was placed on scheduled Lopressor 25 mg bid and her prn propranolol was stopped.   She comes in doing well from a cardiac perspective today.  Since starting metoprolol 25 mg twice daily blood pressures have improved to the 120s over 60s to 70s with heart rates in the 60s to 70s.  She has noted a significant improvement in her palpitations.  No chest pain.  No dizziness, presyncope, or syncope.  No lower extremity swelling, orthopnea, PND, or early satiety.  She continues to note an intermittent shortness of breath that occurs at rest which improves by taking 1 or 2 deep breaths in.  She walks on a treadmill on a daily basis for approximately 30 minutes without any symptoms of shortness of breath or chest pain.  She continues to attribute this to physical deconditioning following the passing of her husband.  She is interested in enrolling in cardiopulmonary rehab to increase her activity levels to assist with the shortness of breath.  She does not have any issues or concerns at this time.   Past Medical History:  Diagnosis Date  . Cystocele or rectocele with incomplete uterine prolapse   . Hyperlipidemia   . Hypertension   . Irritable bowel syndrome   . Palpitations   . Vaginal atrophy     Past Surgical History:  Procedure Laterality Date  . CATARACT EXTRACTION  2006    Current Meds  Medication Sig  . ACCUPRIL 40 MG tablet Take 1 tablet (40 mg total) by mouth daily.  Marland Kitchen aspirin 81 MG tablet Take 81 mg by mouth daily.  . cetirizine (ZYRTEC) 10 MG tablet Take 10 mg by mouth daily as needed.   . Cholecalciferol (VITAMIN D-3) 1000 UNITS CAPS Take by mouth.  . estradiol (ESTRACE) 0.1 MG/GM vaginal cream PLACE 1 APPLICATORFUL VAGINALLY 2 TIMES A WEEK  . hydrALAZINE (APRESOLINE)  25 MG tablet Take 1 tablet (25 mg total) by mouth 3 (three) times daily as needed.  Marland Kitchen LIPITOR 40 MG tablet TAKE 1 TABLET BY MOUTH ONCE DAILY  . magnesium oxide (MAG-OX) 400 (241.3 Mg) MG tablet TAKE 1 TABLET BY MOUTH ONCE DAILY  . metoprolol tartrate (LOPRESSOR) 25 MG tablet Take 1 tablet (25 mg total) by mouth 2 (two) times daily.  Marland Kitchen triamcinolone (NASACORT) 55 MCG/ACT nasal inhaler Place 2 sprays into the nose daily as needed.     Allergies:   Patient has no known allergies.   Social History:  The patient  reports that she has never smoked. She has never used smokeless tobacco. She reports that she does not drink alcohol or use drugs.   Family History:  The patient's family history includes Dementia in her mother and sister; Heart disease in her brother and sister; Leukemia in her brother; Lung cancer in her brother; Stroke in her father; Throat cancer in her brother; Transient ischemic attack in her mother.  ROS:   Review of Systems  Constitutional: Positive for malaise/fatigue. Negative for chills, diaphoresis, fever and weight loss.  HENT: Negative for congestion.   Eyes: Negative for discharge and redness.  Respiratory: Positive for shortness of breath. Negative for cough, hemoptysis, sputum production and wheezing.   Cardiovascular: Positive for palpitations. Negative for chest pain, orthopnea, claudication, leg swelling and PND.       Palpitations much improved  Gastrointestinal: Negative for abdominal pain, blood in stool, heartburn, melena, nausea and vomiting.  Genitourinary: Negative for hematuria.  Musculoskeletal: Negative for falls and myalgias.  Skin: Negative for rash.  Neurological: Negative for dizziness, tingling, tremors, sensory change, speech change, focal weakness, loss of consciousness and weakness.  Endo/Heme/Allergies: Does not bruise/bleed easily.  Psychiatric/Behavioral: Negative for substance abuse. The patient is not nervous/anxious.      PHYSICAL EXAM:    VS:  BP 124/70 (BP Location: Left Arm, Patient Position: Sitting, Cuff Size: Normal)   Pulse 64   Ht 5' 1.5" (1.562 m)   Wt 137 lb 8 oz (62.4 kg)   BMI 25.56 kg/m  BMI: Body mass index is 25.56 kg/m.  Physical Exam  Constitutional: She is oriented to person, place, and time. She appears well-developed and well-nourished.  HENT:  Head: Normocephalic and atraumatic.  Eyes: Right eye exhibits no discharge. Left eye exhibits no discharge.  Neck: Normal range of motion. No JVD present.  Cardiovascular: Normal rate, regular rhythm, S1 normal and S2 normal. Exam reveals no distant heart sounds, no friction rub, no midsystolic click and no opening snap.  Murmur heard. High-pitched blowing holosystolic murmur is present with a grade of 1/6 at the apex. Pulses:      Posterior tibial pulses are 2+ on the right side and 2+ on the left side.  Pulmonary/Chest: Effort normal and breath sounds normal. No respiratory distress. She has no decreased breath sounds. She has no wheezes. She has no rales. She exhibits  no tenderness.  Abdominal: Soft. She exhibits no distension. There is no abdominal tenderness.  Musculoskeletal:        General: No edema.  Neurological: She is alert and oriented to person, place, and time.  Skin: Skin is warm and dry. No cyanosis. Nails show no clubbing.  Psychiatric: She has a normal mood and affect. Her speech is normal and behavior is normal. Judgment and thought content normal.     EKG:  Was ordered and interpreted by me today. Shows NSR, 64 bpm, left axis deviation, LVH, baseline wandering, nonspecific st/t changes (unchanged from prior)  Recent Labs: 02/10/2018: Hemoglobin 14.6; Platelets 245.0; TSH 3.78 10/15/2018: ALT 18; BUN 19; Creatinine, Ser 1.04; Potassium 4.6; Sodium 133 11/13/2018: BNP 44.9  10/15/2018: Cholesterol 170; HDL 57.00; LDL Cholesterol 92; Total CHOL/HDL Ratio 3; Triglycerides 103.0; VLDL 20.6   CrCl cannot be calculated (Patient's most recent  lab result is older than the maximum 21 days allowed.).   Wt Readings from Last 3 Encounters:  12/22/18 137 lb 8 oz (62.4 kg)  12/02/18 136 lb (61.7 kg)  11/13/18 135 lb 4 oz (61.3 kg)     Other studies reviewed: Additional studies/records reviewed today include: summarized above  ASSESSMENT AND PLAN:  1. SVT: Symptoms of palpitations are significantly improved following scheduled Lopressor 25 mg twice daily.  Recent TSH and potassium unremarkable.  Continue Lopressor 25 mg twice daily.  Should she note return of symptoms consistent with SVT could consider referral to EP for SVT ablation.  2. Dyspnea: No evidence of heart failure based off recent labs and recent echocardiogram.  Symptoms are atypical given that they improve with taking 1-2 deep inhalations and are not exertional.  Discussed potential ischemic evaluation with patient however given her ability to ambulate without any symptoms she prefers to defer this at this time.  She has been referred to pulmonary rehab at her request.  Should symptoms persist or if patient would like, would consider stress testing versus cardiac CT with FFR.  3. Hypertension: Blood pressure is well controlled today.  Continue Lopressor and Accupril.  4. Hyperlipidemia: LDL of 92 from 09/2018 with normal LFT at that time.  Remains on Lipitor.  Disposition: F/u with Dr. Mariah MillingGollan or an APP in 6 months, sooner if needed.  Current medicines are reviewed at length with the patient today.  The patient did not have any concerns regarding medicines.  Signed, Eula Listenyan Nesiah Jump, PA-C 12/22/2018 1:32 PM     [facility] [address] [place], [place] [address] [phone]

## 2018-12-22 ENCOUNTER — Encounter: Payer: Self-pay | Admitting: Physician Assistant

## 2018-12-22 ENCOUNTER — Ambulatory Visit (INDEPENDENT_AMBULATORY_CARE_PROVIDER_SITE_OTHER): Payer: Medicare Other | Admitting: Physician Assistant

## 2018-12-22 VITALS — BP 124/70 | HR 64 | Ht 61.5 in | Wt 137.5 lb

## 2018-12-22 DIAGNOSIS — I10 Essential (primary) hypertension: Secondary | ICD-10-CM | POA: Diagnosis not present

## 2018-12-22 DIAGNOSIS — R0602 Shortness of breath: Secondary | ICD-10-CM | POA: Diagnosis not present

## 2018-12-22 DIAGNOSIS — E782 Mixed hyperlipidemia: Secondary | ICD-10-CM

## 2018-12-22 DIAGNOSIS — I471 Supraventricular tachycardia: Secondary | ICD-10-CM

## 2018-12-22 NOTE — Patient Instructions (Signed)
Medication Instructions:  No changes If you need a refill on your cardiac medications before your next appointment, please call your pharmacy.   Lab work: None ordered  Testing/Procedures: None ordered  Follow-Up: At [facility], you and your health needs are our priority.  As part of our continuing mission to provide you with exceptional heart care, we have created designated Provider Care Teams.  These Care Teams include your primary Cardiologist (physician) and Advanced Practice Providers (APPs -  Physician Assistants and Nurse Practitioners) who all work together to provide you with the care you need, when you need it. You will need a follow up appointment in 6 months.  Please call our office 2 months in advance to schedule this appointment.  You may see Dr. Mariah MillingGollan or one of the following Advanced Practice Providers on your designated Care Team:   Nicolasa Duckinghristopher Berge, NP Eula Listenyan Dunn, PA-C . Marisue IvanJacquelyn Visser, PA-C  Any Other Special Instructions Will Be Listed Below (If Applicable). A referral has been placed to Pulmonary Rehab. They will be calling you to set his appointment up.

## 2018-12-30 ENCOUNTER — Other Ambulatory Visit: Payer: Self-pay | Admitting: *Deleted

## 2018-12-30 DIAGNOSIS — R0609 Other forms of dyspnea: Principal | ICD-10-CM

## 2019-01-06 ENCOUNTER — Encounter: Payer: Medicare Other | Attending: Cardiovascular Disease

## 2019-01-06 ENCOUNTER — Other Ambulatory Visit: Payer: Self-pay

## 2019-01-06 VITALS — Ht 61.5 in | Wt 137.5 lb

## 2019-01-06 DIAGNOSIS — R0609 Other forms of dyspnea: Secondary | ICD-10-CM | POA: Diagnosis not present

## 2019-01-06 NOTE — Patient Instructions (Signed)
Patient Instructions  Patient Details  Name: Elizabeth [name] MRN: [id number] Date of Birth: 08/30/1927 Referring Provider:  Antonieta IbaGollan, Timothy J, MD  Below are your personal goals for exercise, nutrition, and risk factors. Our goal is to help you stay on track towards obtaining and maintaining these goals. We will be discussing your progress on these goals with you throughout the program.  Initial Exercise Prescription: Initial Exercise Prescription - 01/06/19 1500      Date of Initial Exercise RX and Referring Provider   Date  01/06/19    Referring Provider  Julien NordmannGollan, Timothy MD      Treadmill   MPH  2    Grade  0.5    Minutes  15    METs  2.67      NuStep   Level  1    SPM  80    Minutes  15    METs  2      REL-XR   Level  1    Speed  50    Minutes  15    METs  2      Prescription Details   Frequency (times per week)  3    Duration  Progress to 45 minutes of aerobic exercise without signs/symptoms of physical distress      Intensity   THRR 40-80% of Max Heartrate  93-118    Ratings of Perceived Exertion  11-13    Perceived Dyspnea  0-4      Progression   Progression  Continue to progress workloads to maintain intensity without signs/symptoms of physical distress.      Resistance Training   Training Prescription  Yes    Weight  3 lbs    Reps  10-15      Exercise Goals: Frequency: Be able to perform aerobic exercise two to three times per week in program working toward 2-5 days per week of home exercise. Intensity: Work with a perceived exertion of 11 (fairly light) - 15 (hard) while following your exercise prescription.  We will make changes to your prescription with you as you progress through the program. Duration: Be able to do 30 to 45 minutes of continuous aerobic exercise in addition to a 5 minute warm-up and a 5 minute cool-down routine. Nutrition Goals: Your personal nutrition goals will be established when you do your nutrition analysis with the  dietician. The following are general nutrition guidelines to follow: Cholesterol < 200mg /day Sodium < 1500mg /day Fiber: Women over 50 yrs - 21 grams per day Personal Goals: Personal Goals and Risk Factors at Admission - 01/06/19 1509      Core Components/Risk Factors/Patient Goals on Admission    Weight Management  Yes;Weight Maintenance    Intervention  Weight Management: Provide education and appropriate resources to help participant work on and attain dietary goals.;Weight Management: Develop a combined nutrition and exercise program designed to reach desired caloric intake, while maintaining appropriate intake of nutrient and fiber, sodium and fats, and appropriate energy expenditure required for the weight goal.;Weight Management/Obesity: Establish reasonable short term and long term weight goals.    Admit Weight  137 lb (62.1 kg)    Goal Weight: Short Term  132 lb (59.9 kg)    Goal Weight: Long Term  130 lb (59 kg)    Expected Outcomes  Short Term: Continue to assess and modify interventions until short term weight is achieved;Long Term: Adherence to nutrition and physical activity/exercise program aimed toward attainment of established weight goal;Weight Maintenance: Understanding  of the daily nutrition guidelines, which includes 25-35% calories from fat, 7% or less cal from saturated fats, less than 200mg  cholesterol, less than 1.5gm of sodium, & 5 or more servings of fruits and vegetables daily;Understanding recommendations for meals to include 15-35% energy as protein, 25-35% energy from fat, 35-60% energy from carbohydrates, less than 200mg  of dietary cholesterol, 20-35 gm of total fiber daily;Understanding of distribution of calorie intake throughout the day with the consumption of 4-5 meals/snacks    Improve shortness of breath with ADL's  Yes    Intervention  Provide education, individualized exercise plan and daily activity instruction to help decrease symptoms of SOB with activities  of daily living.    Expected Outcomes  Short Term: Improve cardiorespiratory fitness to achieve a reduction of symptoms when performing ADLs;Long Term: Be able to perform more ADLs without symptoms or delay the onset of symptoms    Hypertension  Yes    Intervention  Provide education on lifestyle modifcations including regular physical activity/exercise, weight management, moderate sodium restriction and increased consumption of fresh fruit, vegetables, and low fat dairy, alcohol moderation, and smoking cessation.;Monitor prescription use compliance.    Expected Outcomes  Short Term: Continued assessment and intervention until BP is < 140/74mm HG in hypertensive participants. < 130/22mm HG in hypertensive participants with diabetes, heart failure or chronic kidney disease.;Long Term: Maintenance of blood pressure at goal levels.    Lipids  Yes    Intervention  Provide education and support for participant on nutrition & aerobic/resistive exercise along with prescribed medications to achieve LDL 70mg , HDL >40mg .    Expected Outcomes  Short Term: Participant states understanding of desired cholesterol values and is compliant with medications prescribed. Participant is following exercise prescription and nutrition guidelines.;Long Term: Cholesterol controlled with medications as prescribed, with individualized exercise RX and with personalized nutrition plan. Value goals: LDL < 70mg , HDL > 40 mg.      Tobacco Use Initial Evaluation: Social History   Tobacco Use  Smoking Status Never Smoker  Smokeless Tobacco Never Used   Exercise Goals and Review: Exercise Goals    Row Name 01/06/19 1540             Exercise Goals   Increase Physical Activity  Yes       Intervention  Provide advice, education, support and counseling about physical activity/exercise needs.;Develop an individualized exercise prescription for aerobic and resistive training based on initial evaluation findings, risk  stratification, comorbidities and participant's personal goals.       Expected Outcomes  Short Term: Attend rehab on a regular basis to increase amount of physical activity.;Long Term: Add in home exercise to make exercise part of routine and to increase amount of physical activity.;Long Term: Exercising regularly at least 3-5 days a week.       Increase Strength and Stamina  Yes       Intervention  Provide advice, education, support and counseling about physical activity/exercise needs.;Develop an individualized exercise prescription for aerobic and resistive training based on initial evaluation findings, risk stratification, comorbidities and participant's personal goals.       Expected Outcomes  Short Term: Increase workloads from initial exercise prescription for resistance, speed, and METs.;Long Term: Improve cardiorespiratory fitness, muscular endurance and strength as measured by increased METs and functional capacity ( );Short Term: Perform resistance training exercises routinely during rehab and add in resistance training at home       Able to understand and use rate of perceived exertion (RPE) scale  Yes  Intervention  Provide education and explanation on how to use RPE scale       Expected Outcomes  Short Term: Able to use RPE daily in rehab to express subjective intensity level;Long Term:  Able to use RPE to guide intensity level when exercising independently       Able to understand and use Dyspnea scale  Yes       Intervention  Provide education and explanation on how to use Dyspnea scale       Expected Outcomes  Short Term: Able to use Dyspnea scale daily in rehab to express subjective sense of shortness of breath during exertion;Long Term: Able to use Dyspnea scale to guide intensity level when exercising independently       Knowledge and understanding of Target Heart Rate Range (THRR)  Yes       Intervention  Provide education and explanation of THRR including how the numbers  were predicted and where they are located for reference       Expected Outcomes  Short Term: Able to state/look up THRR;Short Term: Able to use daily as guideline for intensity in rehab;Long Term: Able to use THRR to govern intensity when exercising independently       Able to check pulse independently  Yes       Intervention  Provide education and demonstration on how to check pulse in carotid and radial arteries.;Review the importance of being able to check your own pulse for safety during independent exercise       Expected Outcomes  Short Term: Able to explain why pulse checking is important during independent exercise;Long Term: Able to check pulse independently and accurately       Understanding of Exercise Prescription  Yes       Intervention  Provide education, explanation, and written materials on patient's individual exercise prescription       Expected Outcomes  Short Term: Able to explain program exercise prescription;Long Term: Able to explain home exercise prescription to exercise independently         Copy of goals given to participant.

## 2019-01-06 NOTE — Progress Notes (Signed)
Pulmonary Individual Treatment Plan  Patient Details  Name: Elizabeth [name] MRN: [id number] Date of Birth: 1927-01-27 Referring Provider:     Pulmonary Rehab from 01/06/2019 in [facility] Cardiac and Pulmonary Rehab  Referring Provider  Ida Rogue MD      Initial Encounter Date:    Pulmonary Rehab from 01/06/2019 in [facility] Cardiac and Pulmonary Rehab  Date  01/06/19      Visit Diagnosis: Dyspnea on exertion  Patient's Home Medications on Admission:  Current Outpatient Medications:  .  ACCUPRIL 40 MG tablet, Take 1 tablet (40 mg total) by mouth daily., Disp: 90 tablet, Rfl: 1 .  aspirin 81 MG tablet, Take 81 mg by mouth daily., Disp: , Rfl:  .  cetirizine (ZYRTEC) 10 MG tablet, Take 10 mg by mouth daily as needed. , Disp: , Rfl:  .  Cholecalciferol (VITAMIN D-3) 1000 UNITS CAPS, Take by mouth., Disp: , Rfl:  .  estradiol (ESTRACE) 0.1 MG/GM vaginal cream, PLACE 1 APPLICATORFUL VAGINALLY 2 TIMES A WEEK, Disp: 42.5 g, Rfl: 2 .  hydrALAZINE (APRESOLINE) 25 MG tablet, Take 1 tablet (25 mg total) by mouth 3 (three) times daily as needed., Disp: 90 tablet, Rfl: 6 .  LIPITOR 40 MG tablet, TAKE 1 TABLET BY MOUTH ONCE DAILY, Disp: 30 tablet, Rfl: 5 .  magnesium oxide (MAG-OX) 400 (241.3 Mg) MG tablet, TAKE 1 TABLET BY MOUTH ONCE DAILY, Disp: 30 tablet, Rfl: 6 .  metoprolol tartrate (LOPRESSOR) 25 MG tablet, Take 1 tablet (25 mg total) by mouth 2 (two) times daily., Disp: 180 tablet, Rfl: 2 .  triamcinolone (NASACORT) 55 MCG/ACT nasal inhaler, Place 2 sprays into the nose daily as needed. , Disp: , Rfl:   Past Medical History: Past Medical History:  Diagnosis Date  . Cystocele or rectocele with incomplete uterine prolapse   . Hyperlipidemia   . Hypertension   . Irritable bowel syndrome   . Palpitations   . Vaginal atrophy     Tobacco Use: Social History   Tobacco Use  Smoking Status Never Smoker  Smokeless Tobacco Never Used    Labs: Recent Review Flowsheet Data    Labs for ITP  Cardiac and Pulmonary Rehab Latest Ref Rng & Units 05/27/2017 10/08/2017 02/10/2018 06/17/2018 10/15/2018   Cholestrol 0 - 200 mg/dL 195 205(H) 154 185 170   LDLCALC 0 - 99 mg/dL 119(H) 138(H) 80 111(H) 92   LDLDIRECT mg/dL - - - - -   HDL >39.00 mg/dL 55.90 45.00 54.50 57.10 57.00   Trlycerides 0.0 - 149.0 mg/dL 103.0 108.0 95.0 85.0 103.0       Pulmonary Assessment Scores: Pulmonary Assessment Scores    Row Name 01/06/19 1455         ADL UCSD   ADL Phase  Entry     SOB Score total  9     Rest  0     Walk  0     Stairs  1     Bath  0     Dress  0     Shop  1       CAT Score   CAT Score  4       mMRC Score   mMRC Score  1        Pulmonary Function Assessment: Pulmonary Function Assessment - 01/06/19 1514      Breath   Bilateral Breath Sounds  Clear    Shortness of Breath  No;Limiting activity   she gets short of breath sometimes.  Exercise Target Goals: Exercise Program Goal: Individual exercise prescription set using results from initial 6 min walk test and THRR while considering  patient's activity barriers and safety.   Exercise Prescription Goal: Initial exercise prescription builds to 30-45 minutes a day of aerobic activity, 2-3 days per week.  Home exercise guidelines will be given to patient during program as part of exercise prescription that the participant will acknowledge.  Activity Barriers & Risk Stratification: Activity Barriers & Cardiac Risk Stratification - 01/06/19 1537      Activity Barriers & Cardiac Risk Stratification   Activity Barriers  Balance Concerns;Shortness of Breath;Deconditioning;Muscular Weakness       6 Minute Walk: 6 Minute Walk    Row Name 01/06/19 1535         6 Minute Walk   Phase  Initial     Distance  1384 feet     Walk Time  6 minutes     # of Rest Breaks  0     MPH  2.62     METS  1.96     RPE  11     Perceived Dyspnea   1     VO2 Peak  6.88     Symptoms  No     Resting HR  67 bpm     Resting BP   134/62     Resting Oxygen Saturation   97 %     Exercise Oxygen Saturation  during 6 min walk  94 %     Max Ex. HR  92 bpm     Max Ex. BP  138/66     2 Minute Post BP  132/62       Interval HR   1 Minute HR  80     2 Minute HR  87     3 Minute HR  90     4 Minute HR  89     5 Minute HR  86     6 Minute HR  92     2 Minute Post HR  71     Interval Heart Rate?  Yes       Interval Oxygen   Interval Oxygen?  Yes     Baseline Oxygen Saturation %  97 %     1 Minute Oxygen Saturation %  97 %     1 Minute Liters of Oxygen  0 L Room Air     2 Minute Oxygen Saturation %  95 %     2 Minute Liters of Oxygen  0 L     3 Minute Oxygen Saturation %  94 %     3 Minute Liters of Oxygen  0 L     4 Minute Oxygen Saturation %  97 %     4 Minute Liters of Oxygen  0 L     5 Minute Oxygen Saturation %  96 %     5 Minute Liters of Oxygen  0 L     6 Minute Oxygen Saturation %  98 %     6 Minute Liters of Oxygen  0 L     2 Minute Post Oxygen Saturation %  98 %     2 Minute Post Liters of Oxygen  0 L       Oxygen Initial Assessment: Oxygen Initial Assessment - 01/06/19 1513      Home Oxygen   Home Oxygen Device  None    Sleep Oxygen Prescription  None  Home Exercise Oxygen Prescription  None    Home at Rest Exercise Oxygen Prescription  None      Initial 6 min Walk   Oxygen Used  None      Program Oxygen Prescription   Program Oxygen Prescription  None      Intervention   Short Term Goals  To learn and understand importance of maintaining oxygen saturations>88%;To learn and demonstrate proper pursed lip breathing techniques or other breathing techniques.;To learn and understand importance of monitoring SPO2 with pulse oximeter and demonstrate accurate use of the pulse oximeter.    Long  Term Goals  Maintenance of O2 saturations>88%;Exhibits proper breathing techniques, such as pursed lip breathing or other method taught during program session;Verbalizes importance of monitoring SPO2 with  pulse oximeter and return demonstration       Oxygen Re-Evaluation:   Oxygen Discharge (Final Oxygen Re-Evaluation):   Initial Exercise Prescription: Initial Exercise Prescription - 01/06/19 1500      Date of Initial Exercise RX and Referring Provider   Date  01/06/19    Referring Provider  Ida Rogue MD      Treadmill   MPH  2    Grade  0.5    Minutes  15    METs  2.67      NuStep   Level  1    SPM  80    Minutes  15    METs  2      REL-XR   Level  1    Speed  50    Minutes  15    METs  2      Prescription Details   Frequency (times per week)  3    Duration  Progress to 45 minutes of aerobic exercise without signs/symptoms of physical distress      Intensity   THRR 40-80% of Max Heartrate  93-118    Ratings of Perceived Exertion  11-13    Perceived Dyspnea  0-4      Progression   Progression  Continue to progress workloads to maintain intensity without signs/symptoms of physical distress.      Resistance Training   Training Prescription  Yes    Weight  3 lbs    Reps  10-15       Perform Capillary Blood Glucose checks as needed.  Exercise Prescription Changes: Exercise Prescription Changes    Row Name 01/06/19 1500             Response to Exercise   Blood Pressure (Admit)  134/62       Blood Pressure (Exercise)  138/66       Blood Pressure (Exit)  132/62       Heart Rate (Admit)  67 bpm       Heart Rate (Exercise)  92 bpm       Heart Rate (Exit)  71 bpm       Oxygen Saturation (Admit)  97 %       Oxygen Saturation (Exercise)  94 %       Oxygen Saturation (Exit)  98 %       Rating of Perceived Exertion (Exercise)  11       Perceived Dyspnea (Exercise)  1       Symptoms  none       Comments  walk test results          Exercise Comments:   Exercise Goals and Review: Exercise Goals    Row Name 01/06/19 1540  Exercise Goals   Increase Physical Activity  Yes       Intervention  Provide advice, education, support  and counseling about physical activity/exercise needs.;Develop an individualized exercise prescription for aerobic and resistive training based on initial evaluation findings, risk stratification, comorbidities and participant's personal goals.       Expected Outcomes  Short Term: Attend rehab on a regular basis to increase amount of physical activity.;Long Term: Add in home exercise to make exercise part of routine and to increase amount of physical activity.;Long Term: Exercising regularly at least 3-5 days a week.       Increase Strength and Stamina  Yes       Intervention  Provide advice, education, support and counseling about physical activity/exercise needs.;Develop an individualized exercise prescription for aerobic and resistive training based on initial evaluation findings, risk stratification, comorbidities and participant's personal goals.       Expected Outcomes  Short Term: Increase workloads from initial exercise prescription for resistance, speed, and METs.;Long Term: Improve cardiorespiratory fitness, muscular endurance and strength as measured by increased METs and functional capacity (6MWT);Short Term: Perform resistance training exercises routinely during rehab and add in resistance training at home       Able to understand and use rate of perceived exertion (RPE) scale  Yes       Intervention  Provide education and explanation on how to use RPE scale       Expected Outcomes  Short Term: Able to use RPE daily in rehab to express subjective intensity level;Long Term:  Able to use RPE to guide intensity level when exercising independently       Able to understand and use Dyspnea scale  Yes       Intervention  Provide education and explanation on how to use Dyspnea scale       Expected Outcomes  Short Term: Able to use Dyspnea scale daily in rehab to express subjective sense of shortness of breath during exertion;Long Term: Able to use Dyspnea scale to guide intensity level when exercising  independently       Knowledge and understanding of Target Heart Rate Range (THRR)  Yes       Intervention  Provide education and explanation of THRR including how the numbers were predicted and where they are located for reference       Expected Outcomes  Short Term: Able to state/look up THRR;Short Term: Able to use daily as guideline for intensity in rehab;Long Term: Able to use THRR to govern intensity when exercising independently       Able to check pulse independently  Yes       Intervention  Provide education and demonstration on how to check pulse in carotid and radial arteries.;Review the importance of being able to check your own pulse for safety during independent exercise       Expected Outcomes  Short Term: Able to explain why pulse checking is important during independent exercise;Long Term: Able to check pulse independently and accurately       Understanding of Exercise Prescription  Yes       Intervention  Provide education, explanation, and written materials on patient's individual exercise prescription       Expected Outcomes  Short Term: Able to explain program exercise prescription;Long Term: Able to explain home exercise prescription to exercise independently          Exercise Goals Re-Evaluation :   Discharge Exercise Prescription (Final Exercise Prescription Changes): Exercise Prescription Changes - 01/06/19  1500      Response to Exercise   Blood Pressure (Admit)  134/62    Blood Pressure (Exercise)  138/66    Blood Pressure (Exit)  132/62    Heart Rate (Admit)  67 bpm    Heart Rate (Exercise)  92 bpm    Heart Rate (Exit)  71 bpm    Oxygen Saturation (Admit)  97 %    Oxygen Saturation (Exercise)  94 %    Oxygen Saturation (Exit)  98 %    Rating of Perceived Exertion (Exercise)  11    Perceived Dyspnea (Exercise)  1    Symptoms  none    Comments  walk test results       Nutrition:  Target Goals: Understanding of nutrition guidelines, daily intake of sodium  <1532m, cholesterol <2067m calories 30% from fat and 7% or less from saturated fats, daily to have 5 or more servings of fruits and vegetables.  Biometrics: Pre Biometrics - 01/06/19 1541      Pre Biometrics   Height  5' 1.5" (1.562 m)    Weight  137 lb 8 oz (62.4 kg)    Waist Circumference  41.5 inches    Hip Circumference  39.5 inches    Waist to Hip Ratio  1.05 %    BMI (Calculated)  25.56    Single Leg Stand  4.04 seconds        Nutrition Therapy Plan and Nutrition Goals: Nutrition Therapy & Goals - 01/06/19 1518      Personal Nutrition Goals   Nutrition Goal  Lose some weight.    Comments  She feels that her diet is not as good as it used to be. She is states she tries to eat healthy but not as well as she used to. Her weight gain is new and she wants to lsoe a little weight.      Intervention Plan   Intervention  Prescribe, educate and counsel regarding individualized specific dietary modifications aiming towards targeted core components such as weight, hypertension, lipid management, diabetes, heart failure and other comorbidities.;Nutrition handout(s) given to patient.    Expected Outcomes  Short Term Goal: Understand basic principles of dietary content, such as calories, fat, sodium, cholesterol and nutrients.;Long Term Goal: Adherence to prescribed nutrition plan.       Nutrition Assessments: Nutrition Assessments - 01/06/19 1459      MEDFICTS Scores   Pre Score  24       Nutrition Goals Re-Evaluation:   Nutrition Goals Discharge (Final Nutrition Goals Re-Evaluation):   Psychosocial: Target Goals: Acknowledge presence or absence of significant depression and/or stress, maximize coping skills, provide positive support system. Participant is able to verbalize types and ability to use techniques and skills needed for reducing stress and depression.   Initial Review & Psychosocial Screening: Initial Psych Review & Screening - 01/06/19 1516      Initial Review    Current issues with  History of Depression;Current Depression;Current Stress Concerns    Source of Stress Concerns  Chronic Illness;Family    Comments  Her husband died recently and is coping with the loss. She is trying to get used to living alone.      Family Dynamics   Good Support System?  Yes    Comments  She can look to her two kids and three grandsons.      Barriers   Psychosocial barriers to participate in program  The patient should benefit from training in stress management and relaxation.  Screening Interventions   Interventions  Encouraged to exercise;To provide support and resources with identified psychosocial needs;Program counselor consult;Provide feedback about the scores to participant    Expected Outcomes  Short Term goal: Utilizing psychosocial counselor, staff and physician to assist with identification of specific Stressors or current issues interfering with healing process. Setting desired goal for each stressor or current issue identified.;Long Term Goal: Stressors or current issues are controlled or eliminated.;Short Term goal: Identification and review with participant of any Quality of Life or Depression concerns found by scoring the questionnaire.;Long Term goal: The participant improves quality of Life and PHQ9 Scores as seen by post scores and/or verbalization of changes       Quality of Life Scores:  Scores of 19 and below usually indicate a poorer quality of life in these areas.  A difference of  2-3 points is a clinically meaningful difference.  A difference of 2-3 points in the total score of the Quality of Life Index has been associated with significant improvement in overall quality of life, self-image, physical symptoms, and general health in studies assessing change in quality of life.  PHQ-9: Recent Review Flowsheet Data    Depression screen [facility] 2/9 01/06/2019 05/28/2018 05/27/2017 09/28/2016 07/19/2015   Decreased Interest 1 0 0 0 0   Down, Depressed,  Hopeless 0 0 0 0 0   PHQ - 2 Score 1 0 0 0 0   Altered sleeping 1 - - - -   Tired, decreased energy 1 - - - -   Change in appetite 0 - - - -   Feeling bad or failure about yourself  0 - - - -   Trouble concentrating 0 - - - -   Moving slowly or fidgety/restless 0 - - - -   Suicidal thoughts 0 - - - -   PHQ-9 Score 3 - - - -   Difficult doing work/chores Not difficult at all - - - -     Interpretation of Total Score  Total Score Depression Severity:  1-4 = Minimal depression, 5-9 = Mild depression, 10-14 = Moderate depression, 15-19 = Moderately severe depression, 20-27 = Severe depression   Psychosocial Evaluation and Intervention:   Psychosocial Re-Evaluation:   Psychosocial Discharge (Final Psychosocial Re-Evaluation):   Education: Education Goals: Education classes will be provided on a weekly basis, covering required topics. Participant will state understanding/return demonstration of topics presented.  Learning Barriers/Preferences: Learning Barriers/Preferences - 01/06/19 1516      Learning Barriers/Preferences   Learning Barriers  Hearing    Learning Preferences  None       Education Topics:  Initial Evaluation Education: - Verbal, written and demonstration of respiratory meds, oximetry and breathing techniques. Instruction on use of nebulizers and MDIs and importance of monitoring MDI activations.   Pulmonary Rehab from 01/06/2019 in [facility] Cardiac and Pulmonary Rehab  Date  01/06/19  Educator  [facility]  Instruction Review Code  1- Verbalizes Understanding      General Nutrition Guidelines/Fats and Fiber: -Group instruction provided by verbal, written material, models and posters to present the general guidelines for heart healthy nutrition. Gives an explanation and review of dietary fats and fiber.   Controlling Sodium/Reading Food Labels: -Group verbal and written material supporting the discussion of sodium use in heart healthy nutrition. Review and explanation  with models, verbal and written materials for utilization of the food label.   Exercise Physiology & General Exercise Guidelines: - Group verbal and written instruction with models to review  the exercise physiology of the cardiovascular system and associated critical values. Provides general exercise guidelines with specific guidelines to those with heart or lung disease.    Aerobic Exercise & Resistance Training: - Gives group verbal and written instruction on the various components of exercise. Focuses on aerobic and resistive training programs and the benefits of this training and how to safely progress through these programs.   Flexibility, Balance, Mind/Body Relaxation: Provides group verbal/written instruction on the benefits of flexibility and balance training, including mind/body exercise modes such as yoga, pilates and tai chi.  Demonstration and skill practice provided.   Stress and Anxiety: - Provides group verbal and written instruction about the health risks of elevated stress and causes of high stress.  Discuss the correlation between heart/lung disease and anxiety and treatment options. Review healthy ways to manage with stress and anxiety.   Depression: - Provides group verbal and written instruction on the correlation between heart/lung disease and depressed mood, treatment options, and the stigmas associated with seeking treatment.   Exercise & Equipment Safety: - Individual verbal instruction and demonstration of equipment use and safety with use of the equipment.   Pulmonary Rehab from 01/06/2019 in [facility] Cardiac and Pulmonary Rehab  Date  01/06/19  Educator  [facility]  Instruction Review Code  1- Verbalizes Understanding      Infection Prevention: - Provides verbal and written material to individual with discussion of infection control including proper hand washing and proper equipment cleaning during exercise session.   Pulmonary Rehab from 01/06/2019 in [facility] Cardiac and  Pulmonary Rehab  Date  01/06/19  Educator  [facility]  Instruction Review Code  1- Verbalizes Understanding      Falls Prevention: - Provides verbal and written material to individual with discussion of falls prevention and safety.   Pulmonary Rehab from 01/06/2019 in [facility] Cardiac and Pulmonary Rehab  Date  01/06/19  Educator  [facility]  Instruction Review Code  1- Verbalizes Understanding      Diabetes: - Individual verbal and written instruction to review signs/symptoms of diabetes, desired ranges of glucose level fasting, after meals and with exercise. Advice that pre and post exercise glucose checks will be done for 3 sessions at entry of program.   Chronic Lung Diseases: - Group verbal and written instruction to review updates, respiratory medications, advancements in procedures and treatments. Discuss use of supplemental oxygen including available portable oxygen systems, continuous and intermittent flow rates, concentrators, personal use and safety guidelines. Review proper use of inhaler and spacers. Provide informative websites for self-education.    Energy Conservation: - Provide group verbal and written instruction for methods to conserve energy, plan and organize activities. Instruct on pacing techniques, use of adaptive equipment and posture/positioning to relieve shortness of breath.   Triggers and Exacerbations: - Group verbal and written instruction to review types of environmental triggers and ways to prevent exacerbations. Discuss weather changes, air quality and the benefits of nasal washing. Review warning signs and symptoms to help prevent infections. Discuss techniques for effective airway clearance, coughing, and vibrations.   AED/CPR: - Group verbal and written instruction with the use of models to demonstrate the basic use of the AED with the basic ABC's of resuscitation.   Anatomy and Physiology of the Lungs: - Group verbal and written instruction with the use of models  to provide basic lung anatomy and physiology related to function, structure and complications of lung disease.   Anatomy & Physiology of the Heart: - Group verbal and written instruction  and models provide basic cardiac anatomy and physiology, with the coronary electrical and arterial systems. Review of Valvular disease and Heart Failure   Cardiac Medications: - Group verbal and written instruction to review commonly prescribed medications for heart disease. Reviews the medication, class of the drug, and side effects.   Know Your Numbers and Risk Factors: -Group verbal and written instruction about important numbers in your health.  Discussion of what are risk factors and how they play a role in the disease process.  Review of Cholesterol, Blood Pressure, Diabetes, and BMI and the role they play in your overall health.   Sleep Hygiene: -Provides group verbal and written instruction about how sleep can affect your health.  Define sleep hygiene, discuss sleep cycles and impact of sleep habits. Review good sleep hygiene tips.    Other: -Provides group and verbal instruction on various topics (see comments)    Knowledge Questionnaire Score: Knowledge Questionnaire Score - 01/06/19 1500      Knowledge Questionnaire Score   Pre Score  15/18   reviewed with patient       Core Components/Risk Factors/Patient Goals at Admission: Personal Goals and Risk Factors at Admission - 01/06/19 1509      Core Components/Risk Factors/Patient Goals on Admission    Weight Management  Yes;Weight Maintenance    Intervention  Weight Management: Provide education and appropriate resources to help participant work on and attain dietary goals.;Weight Management: Develop a combined nutrition and exercise program designed to reach desired caloric intake, while maintaining appropriate intake of nutrient and fiber, sodium and fats, and appropriate energy expenditure required for the weight goal.;Weight  Management/Obesity: Establish reasonable short term and long term weight goals.    Admit Weight  137 lb (62.1 kg)    Goal Weight: Short Term  132 lb (59.9 kg)    Goal Weight: Long Term  130 lb (59 kg)    Expected Outcomes  Short Term: Continue to assess and modify interventions until short term weight is achieved;Long Term: Adherence to nutrition and physical activity/exercise program aimed toward attainment of established weight goal;Weight Maintenance: Understanding of the daily nutrition guidelines, which includes 25-35% calories from fat, 7% or less cal from saturated fats, less than '200mg'$  cholesterol, less than 1.5gm of sodium, & 5 or more servings of fruits and vegetables daily;Understanding recommendations for meals to include 15-35% energy as protein, 25-35% energy from fat, 35-60% energy from carbohydrates, less than '200mg'$  of dietary cholesterol, 20-35 gm of total fiber daily;Understanding of distribution of calorie intake throughout the day with the consumption of 4-5 meals/snacks    Improve shortness of breath with ADL's  Yes    Intervention  Provide education, individualized exercise plan and daily activity instruction to help decrease symptoms of SOB with activities of daily living.    Expected Outcomes  Short Term: Improve cardiorespiratory fitness to achieve a reduction of symptoms when performing ADLs;Long Term: Be able to perform more ADLs without symptoms or delay the onset of symptoms    Hypertension  Yes    Intervention  Provide education on lifestyle modifcations including regular physical activity/exercise, weight management, moderate sodium restriction and increased consumption of fresh fruit, vegetables, and low fat dairy, alcohol moderation, and smoking cessation.;Monitor prescription use compliance.    Expected Outcomes  Short Term: Continued assessment and intervention until BP is < 140/43m HG in hypertensive participants. < 130/839mHG in hypertensive participants with  diabetes, heart failure or chronic kidney disease.;Long Term: Maintenance of blood pressure at goal levels.  Lipids  Yes    Intervention  Provide education and support for participant on nutrition & aerobic/resistive exercise along with prescribed medications to achieve LDL <44m, HDL >427m    Expected Outcomes  Short Term: Participant states understanding of desired cholesterol values and is compliant with medications prescribed. Participant is following exercise prescription and nutrition guidelines.;Long Term: Cholesterol controlled with medications as prescribed, with individualized exercise RX and with personalized nutrition plan. Value goals: LDL < 7073mHDL > 40 mg.       Core Components/Risk Factors/Patient Goals Review:    Core Components/Risk Factors/Patient Goals at Discharge (Final Review):    ITP Comments: ITP Comments    Row Name 01/06/19 1431           ITP Comments  Medical Evaluation completed. Chart sent for review and changes to Dr. MarEmily Filbertrector of [place] be found [facility]          Comments: Initial ITP

## 2019-01-06 NOTE — Progress Notes (Signed)
Daily Session Note  Patient Details  Name: Elizabeth [name] MRN: [id number] Date of Birth: 05-Oct-1927 Referring Provider:     Pulmonary Rehab from 01/06/2019 in [facility] Cardiac and Pulmonary Rehab  Referring Provider  Ida Rogue MD      Encounter Date: 01/06/2019  Check In: Session Check In - 01/06/19 1427      Check-In   Supervising physician immediately available to respond to emergencies  [facility] immediately available ER MD    Physician(s)  Dr. Rockey Situ    Location  [name]    Staff Present  Alberteen Sam, MA, RCEP, CCRP, Exercise Physiologist;[employer] Tessie Fass RCP,RRT,BSRT    Medication changes reported      No    Fall or balance concerns reported     No    Warm-up and Cool-down  Not performed (comment)   medical evaluation   Resistance Training Performed  No    VAD Patient?  No    PAD/SET Patient?  No      Pain Assessment   Currently in Pain?  No/denies        Exercise Prescription Changes - 01/06/19 1500      Response to Exercise   Blood Pressure (Admit)  134/62    Blood Pressure (Exercise)  138/66    Blood Pressure (Exit)  132/62    Heart Rate (Admit)  67 bpm    Heart Rate (Exercise)  92 bpm    Heart Rate (Exit)  71 bpm    Oxygen Saturation (Admit)  97 %    Oxygen Saturation (Exercise)  94 %    Oxygen Saturation (Exit)  98 %    Rating of Perceived Exertion (Exercise)  11    Perceived Dyspnea (Exercise)  1    Symptoms  none    Comments  walk test results       Social History   Tobacco Use  Smoking Status Never Smoker  Smokeless Tobacco Never Used    Goals Met:  Exercise tolerated well Personal goals reviewed Queuing for purse lip breathing No report of cardiac concerns or symptoms Strength training completed today  Goals Unmet:  Not Applicable  Comments:  6 Minute Walk    Row Name 01/06/19 1535         6 Minute Walk   Phase  Initial     Distance  1384 feet     Walk Time  6 minutes     # of Rest Breaks  0     MPH   2.62     METS  1.96     RPE  11     Perceived Dyspnea   1     VO2 Peak  6.88     Symptoms  No     Resting HR  67 bpm     Resting BP  134/62     Resting Oxygen Saturation   97 %     Exercise Oxygen Saturation  during 6 min walk  94 %     Max Ex. HR  92 bpm     Max Ex. BP  138/66     2 Minute Post BP  132/62       Interval HR   1 Minute HR  80     2 Minute HR  87     3 Minute HR  90     4 Minute HR  89     5 Minute HR  86     6  Minute HR  92     2 Minute Post HR  71     Interval Heart Rate?  Yes       Interval Oxygen   Interval Oxygen?  Yes     Baseline Oxygen Saturation %  97 %     1 Minute Oxygen Saturation %  97 %     1 Minute Liters of Oxygen  0 L Room Air     2 Minute Oxygen Saturation %  95 %     2 Minute Liters of Oxygen  0 L     3 Minute Oxygen Saturation %  94 %     3 Minute Liters of Oxygen  0 L     4 Minute Oxygen Saturation %  97 %     4 Minute Liters of Oxygen  0 L     5 Minute Oxygen Saturation %  96 %     5 Minute Liters of Oxygen  0 L     6 Minute Oxygen Saturation %  98 %     6 Minute Liters of Oxygen  0 L     2 Minute Post Oxygen Saturation %  98 %     2 Minute Post Liters of Oxygen  0 L       Service Time 1430-1530   Dr. Emily Filbert is Medical Director for [place] and [facility].

## 2019-01-14 DIAGNOSIS — R0609 Other forms of dyspnea: Secondary | ICD-10-CM | POA: Diagnosis not present

## 2019-01-14 NOTE — Progress Notes (Signed)
Daily Session Note  Patient Details  Name: Elizabeth [name] MRN: [id number] Date of Birth: 1927-08-26 Referring Provider:     Pulmonary Rehab from 01/06/2019 in [facility] Cardiac and Pulmonary Rehab  Referring Provider  Ida Rogue MD      Encounter Date: 01/14/2019  Check In: Session Check In - 01/14/19 1153      Check-In   Supervising physician immediately available to respond to emergencies  [facility] immediately available ER MD    Physician(s)  Dr. Mariea Clonts and Archie Balboa    Location  [name]    Staff Present  Alberteen Sam, MA, RCEP, CCRP, Exercise Physiologist;Dejana Pugsley Alcus Dad, RN BSN    Medication changes reported      No    Fall or balance concerns reported     No    Warm-up and Cool-down  Performed as group-led Higher education careers adviser Performed  Yes    VAD Patient?  No    PAD/SET Patient?  No      Pain Assessment   Currently in Pain?  No/denies          Social History   Tobacco Use  Smoking Status Never Smoker  Smokeless Tobacco Never Used    Goals Met:  Exercise tolerated well Personal goals reviewed Queuing for purse lip breathing No report of cardiac concerns or symptoms Strength training completed today  Goals Unmet:  Not Applicable  Comments: First full day of exercise!  Patient was oriented to gym and equipment including functions, settings, policies, and procedures.  Patient's individual exercise prescription and treatment plan were reviewed.  All starting workloads were established based on the results of the 6 minute walk test done at initial orientation visit.  The plan for exercise progression was also introduced and progression will be customized based on patient's performance and goals.    Dr. Emily Filbert is Medical Director for [place] and [facility].

## 2019-01-16 DIAGNOSIS — R0609 Other forms of dyspnea: Secondary | ICD-10-CM | POA: Diagnosis not present

## 2019-01-16 NOTE — Progress Notes (Signed)
Daily Session Note  Patient Details  Name: Elizabeth [name] MRN: [id number] Date of Birth: 09/06/1927 Referring Provider:     Pulmonary Rehab from 01/06/2019 in [facility] Cardiac and Pulmonary Rehab  Referring Provider  Gollan, Timothy MD      Encounter Date: 01/16/2019  Check In: Session Check In - 01/16/19 1133      Check-In   Supervising physician immediately available to respond to emergencies  [facility] immediately available ER MD    Physician(s)  Dr. Norman and Kinner    Location  [name]    Staff Present  Jessica Hawkins, MA, RCEP, CCRP, Exercise Physiologist;Joseph Hood RCP,RRT,BSRT;Meredith Craven, RN BSN    Medication changes reported      No    Fall or balance concerns reported     No    Warm-up and Cool-down  Performed as group-led instruction    Resistance Training Performed  Yes    VAD Patient?  No    PAD/SET Patient?  No      Pain Assessment   Currently in Pain?  No/denies          Social History   Tobacco Use  Smoking Status Never Smoker  Smokeless Tobacco Never Used    Goals Met:  Independence with exercise equipment Exercise tolerated well No report of cardiac concerns or symptoms Strength training completed today  Goals Unmet:  Not Applicable  Comments: Pt able to follow exercise prescription today without complaint.  Will continue to monitor for progression.    Dr. Mark Miller is Medical Director for [facility] and [facility]. 

## 2019-01-19 DIAGNOSIS — R0609 Other forms of dyspnea: Principal | ICD-10-CM

## 2019-01-19 NOTE — Progress Notes (Signed)
Daily Session Note  Patient Details  Name: Elizabeth [name] MRN: [id number] Date of Birth: [birth year] Referring Provider:     Pulmonary Rehab from 01/06/2019 in [facility] Cardiac and Pulmonary Rehab  Referring Provider  Ida Rogue MD      Encounter Date: 01/19/2019  Check In: Session Check In - 01/19/19 1346      Check-In   Supervising physician immediately available to respond to emergencies  [facility] immediately available ER MD    Physician(s)  Dr. Jodell Cipro and Quentin Cornwall    Location  [name]    Staff Present  Earlean Shawl, BS, ACSM CEP, Exercise Physiologist;Kirby Argueta Foy [place], [place], ACSM CEP, Exercise Physiologist    Medication changes reported      No    Fall or balance concerns reported     No    Warm-up and Cool-down  Performed as group-led instruction    Resistance Training Performed  Yes    VAD Patient?  No    PAD/SET Patient?  No      Pain Assessment   Currently in Pain?  No/denies          Social History   Tobacco Use  Smoking Status Never Smoker  Smokeless Tobacco Never Used    Goals Met:  Independence with exercise equipment Exercise tolerated well No report of cardiac concerns or symptoms Strength training completed today  Goals Unmet:  Not Applicable  Comments: Pt able to follow exercise prescription today without complaint.  Will continue to monitor for progression.    Dr. Emily Filbert is Medical Director for [place] and [facility].

## 2019-01-21 ENCOUNTER — Encounter: Payer: Medicare Other | Admitting: *Deleted

## 2019-01-21 DIAGNOSIS — R0609 Other forms of dyspnea: Secondary | ICD-10-CM | POA: Diagnosis not present

## 2019-01-21 NOTE — Progress Notes (Signed)
Daily Session Note  Patient Details  Name: Elizabeth [name] MRN: [id number] Date of Birth: 03/20/1927 Referring Provider:     Pulmonary Rehab from 01/06/2019 in [facility] Cardiac and Pulmonary Rehab  Referring Provider  Gollan, Timothy MD      Encounter Date: 01/21/2019  Check In: Session Check In - 01/21/19 1138      Check-In   Supervising physician immediately available to respond to emergencies  [facility] immediately available ER MD    Physician(s)  Drs. Williams and Paduchowski    Location  [name]    Staff Present  Meredith Craven, RN BSN;Jessica Hawkins, MA, RCEP, CCRP, Exercise Physiologist;Joseph Hood RCP,RRT,BSRT    Medication changes reported      No    Fall or balance concerns reported     No    Warm-up and Cool-down  Performed as group-led instruction    Resistance Training Performed  Yes    VAD Patient?  No    PAD/SET Patient?  No      Pain Assessment   Currently in Pain?  No/denies          Social History   Tobacco Use  Smoking Status Never Smoker  Smokeless Tobacco Never Used    Goals Met:  Proper associated with RPD/PD & O2 Sat Independence with exercise equipment Exercise tolerated well Queuing for purse lip breathing No report of cardiac concerns or symptoms Strength training completed today  Goals Unmet:  Not Applicable  Comments: Pt able to follow exercise prescription today without complaint.  Will continue to monitor for progression.  Reviewed home exercise with pt today.  Pt plans to walk at home for exercise.  Reviewed THR, pulse, RPE, sign and symptoms, and when to call 911 or MD.  Also discussed weather considerations and indoor options.  Pt voiced understanding.   Dr. Mark Miller is Medical Director for [facility] and [facility]. 

## 2019-01-23 DIAGNOSIS — R0609 Other forms of dyspnea: Principal | ICD-10-CM

## 2019-01-23 NOTE — Progress Notes (Signed)
Daily Session Note  Patient Details  Name: Elizabeth [name] MRN: [id number] Date of Birth: [birth year] Referring Provider:     Pulmonary Rehab from 01/06/2019 in [facility] Cardiac and Pulmonary Rehab  Referring Provider  Ida Rogue MD      Encounter Date: 01/23/2019  Check In:      Social History   Tobacco Use  Smoking Status Never Smoker  Smokeless Tobacco Never Used    Goals Met:  Proper associated with RPD/PD & O2 Sat Independence with exercise equipment Exercise tolerated well Strength training completed today  Goals Unmet:  Not Applicable  Comments: Pt able to follow exercise prescription today without complaint.  Will continue to monitor for progression.    Dr. Emily Filbert is Medical Director for [place] and [facility].

## 2019-01-26 ENCOUNTER — Encounter: Payer: Medicare Other | Attending: Cardiovascular Disease

## 2019-01-26 DIAGNOSIS — R0609 Other forms of dyspnea: Secondary | ICD-10-CM | POA: Insufficient documentation

## 2019-01-28 ENCOUNTER — Encounter: Payer: Medicare Other | Admitting: *Deleted

## 2019-01-28 DIAGNOSIS — R0609 Other forms of dyspnea: Secondary | ICD-10-CM | POA: Diagnosis not present

## 2019-01-28 NOTE — Progress Notes (Signed)
Daily Session Note  Patient Details  Name: Elizabeth [name] MRN: [id number] Date of Birth: [birth year] Referring Provider:     Pulmonary Rehab from 01/06/2019 in [facility] Cardiac and Pulmonary Rehab  Referring Provider  Ida Rogue MD      Encounter Date: 01/28/2019  Check In: Session Check In - 01/28/19 1148      Check-In   Supervising physician immediately available to respond to emergencies  [facility] immediately available ER MD    Physician(s)  Dr. Jimmye Norman and Alfred Levins    Location  [name]    Staff Present  Renita Papa, RN BSN;Joseph Darrin Ni[place], [place], RCEP, CCRP, Exercise Physiologist    Medication changes reported      No    Fall or balance concerns reported     No    Warm-up and Cool-down  Performed as group-led instruction    Resistance Training Performed  Yes    VAD Patient?  No    PAD/SET Patient?  No      Pain Assessment   Currently in Pain?  No/denies          Social History   Tobacco Use  Smoking Status Never Smoker  Smokeless Tobacco Never Used    Goals Met:  Proper associated with RPD/PD & O2 Sat Independence with exercise equipment Exercise tolerated well No report of cardiac concerns or symptoms Strength training completed today  Goals Unmet:  Not Applicable  Comments: Pt able to follow exercise prescription today without complaint.  Will continue to monitor for progression.    Dr. Emily Filbert is Medical Director for [place] and [facility].

## 2019-01-30 ENCOUNTER — Encounter: Payer: Medicare Other | Admitting: *Deleted

## 2019-01-30 DIAGNOSIS — R0609 Other forms of dyspnea: Secondary | ICD-10-CM | POA: Diagnosis not present

## 2019-01-30 NOTE — Progress Notes (Signed)
Daily Session Note  Patient Details  Name: Elizabeth [name] MRN: [id number] Date of Birth: [birth year] Referring Provider:     Pulmonary Rehab from 01/06/2019 in [facility] Cardiac and Pulmonary Rehab  Referring Provider  Ida Rogue MD      Encounter Date: 01/30/2019  Check In: Session Check In - 01/30/19 1134      Check-In   Supervising physician immediately available to respond to emergencies  [facility] immediately available ER MD    Physician(s)  Drs. Filiberto Pinks    Location  [name]    Staff Present  Renita Papa, RN BSN;Masashi Snowdon Luan [place], [place], RCEP, CCRP, Exercise Physiologist;Joseph Tessie Fass RCP,RRT,BSRT    Medication changes reported      No    Fall or balance concerns reported     No    Warm-up and Cool-down  Performed as group-led instruction    Resistance Training Performed  Yes    VAD Patient?  No    PAD/SET Patient?  No      Pain Assessment   Currently in Pain?  No/denies          Social History   Tobacco Use  Smoking Status Never Smoker  Smokeless Tobacco Never Used    Goals Met:  Proper associated with RPD/PD & O2 Sat Independence with exercise equipment Using PLB without cueing & demonstrates good technique Exercise tolerated well No report of cardiac concerns or symptoms Strength training completed today  Goals Unmet:  Not Applicable  Comments: Pt able to follow exercise prescription today without complaint.  Will continue to monitor for progression.    Dr. Emily Filbert is Medical Director for [place] and [facility].

## 2019-02-02 DIAGNOSIS — R0609 Other forms of dyspnea: Principal | ICD-10-CM

## 2019-02-02 NOTE — Progress Notes (Signed)
Pulmonary Individual Treatment Plan  Patient Details  Name: JUDIETH MCKOWN MRN: [id number] Date of Birth: 1927-01-27 Referring Provider:     Pulmonary Rehab from 01/06/2019 in [facility] Cardiac and Pulmonary Rehab  Referring Provider  Ida Rogue MD      Initial Encounter Date:    Pulmonary Rehab from 01/06/2019 in [facility] Cardiac and Pulmonary Rehab  Date  01/06/19      Visit Diagnosis: Dyspnea on exertion  Patient's Home Medications on Admission:  Current Outpatient Medications:  .  ACCUPRIL 40 MG tablet, Take 1 tablet (40 mg total) by mouth daily., Disp: 90 tablet, Rfl: 1 .  aspirin 81 MG tablet, Take 81 mg by mouth daily., Disp: , Rfl:  .  cetirizine (ZYRTEC) 10 MG tablet, Take 10 mg by mouth daily as needed. , Disp: , Rfl:  .  Cholecalciferol (VITAMIN D-3) 1000 UNITS CAPS, Take by mouth., Disp: , Rfl:  .  estradiol (ESTRACE) 0.1 MG/GM vaginal cream, PLACE 1 APPLICATORFUL VAGINALLY 2 TIMES A WEEK, Disp: 42.5 g, Rfl: 2 .  hydrALAZINE (APRESOLINE) 25 MG tablet, Take 1 tablet (25 mg total) by mouth 3 (three) times daily as needed., Disp: 90 tablet, Rfl: 6 .  LIPITOR 40 MG tablet, TAKE 1 TABLET BY MOUTH ONCE DAILY, Disp: 30 tablet, Rfl: 5 .  magnesium oxide (MAG-OX) 400 (241.3 Mg) MG tablet, TAKE 1 TABLET BY MOUTH ONCE DAILY, Disp: 30 tablet, Rfl: 6 .  metoprolol tartrate (LOPRESSOR) 25 MG tablet, Take 1 tablet (25 mg total) by mouth 2 (two) times daily., Disp: 180 tablet, Rfl: 2 .  triamcinolone (NASACORT) 55 MCG/ACT nasal inhaler, Place 2 sprays into the nose daily as needed. , Disp: , Rfl:   Past Medical History: Past Medical History:  Diagnosis Date  . Cystocele or rectocele with incomplete uterine prolapse   . Hyperlipidemia   . Hypertension   . Irritable bowel syndrome   . Palpitations   . Vaginal atrophy     Tobacco Use: Social History   Tobacco Use  Smoking Status Never Smoker  Smokeless Tobacco Never Used    Labs: Recent Review Flowsheet Data    Labs for ITP  Cardiac and Pulmonary Rehab Latest Ref Rng & Units 05/27/2017 10/08/2017 02/10/2018 06/17/2018 10/15/2018   Cholestrol 0 - 200 mg/dL 195 205(H) 154 185 170   LDLCALC 0 - 99 mg/dL 119(H) 138(H) 80 111(H) 92   LDLDIRECT mg/dL - - - - -   HDL >39.00 mg/dL 55.90 45.00 54.50 57.10 57.00   Trlycerides 0.0 - 149.0 mg/dL 103.0 108.0 95.0 85.0 103.0       Pulmonary Assessment Scores: Pulmonary Assessment Scores    Row Name 01/06/19 1455         ADL UCSD   ADL Phase  Entry     SOB Score total  9     Rest  0     Walk  0     Stairs  1     Bath  0     Dress  0     Shop  1       CAT Score   CAT Score  4       mMRC Score   mMRC Score  1        Pulmonary Function Assessment: Pulmonary Function Assessment - 01/06/19 1514      Breath   Bilateral Breath Sounds  Clear    Shortness of Breath  No;Limiting activity   she gets short of breath sometimes.  Exercise Target Goals: Exercise Program Goal: Individual exercise prescription set using results from initial 6 min walk test and THRR while considering  patient's activity barriers and safety.   Exercise Prescription Goal: Initial exercise prescription builds to 30-45 minutes a day of aerobic activity, 2-3 days per week.  Home exercise guidelines will be given to patient during program as part of exercise prescription that the participant will acknowledge.  Activity Barriers & Risk Stratification: Activity Barriers & Cardiac Risk Stratification - 01/06/19 1537      Activity Barriers & Cardiac Risk Stratification   Activity Barriers  Balance Concerns;Shortness of Breath;Deconditioning;Muscular Weakness       6 Minute Walk: 6 Minute Walk    Row Name 01/06/19 1535         6 Minute Walk   Phase  Initial     Distance  1384 feet     Walk Time  6 minutes     # of Rest Breaks  0     MPH  2.62     METS  1.96     RPE  11     Perceived Dyspnea   1     VO2 Peak  6.88     Symptoms  No     Resting HR  67 bpm     Resting BP   134/62     Resting Oxygen Saturation   97 %     Exercise Oxygen Saturation  during 6 min walk  94 %     Max Ex. HR  92 bpm     Max Ex. BP  138/66     2 Minute Post BP  132/62       Interval HR   1 Minute HR  80     2 Minute HR  87     3 Minute HR  90     4 Minute HR  89     5 Minute HR  86     6 Minute HR  92     2 Minute Post HR  71     Interval Heart Rate?  Yes       Interval Oxygen   Interval Oxygen?  Yes     Baseline Oxygen Saturation %  97 %     1 Minute Oxygen Saturation %  97 %     1 Minute Liters of Oxygen  0 L Room Air     2 Minute Oxygen Saturation %  95 %     2 Minute Liters of Oxygen  0 L     3 Minute Oxygen Saturation %  94 %     3 Minute Liters of Oxygen  0 L     4 Minute Oxygen Saturation %  97 %     4 Minute Liters of Oxygen  0 L     5 Minute Oxygen Saturation %  96 %     5 Minute Liters of Oxygen  0 L     6 Minute Oxygen Saturation %  98 %     6 Minute Liters of Oxygen  0 L     2 Minute Post Oxygen Saturation %  98 %     2 Minute Post Liters of Oxygen  0 L       Oxygen Initial Assessment: Oxygen Initial Assessment - 01/06/19 1513      Home Oxygen   Home Oxygen Device  None    Sleep Oxygen Prescription  None  Home Exercise Oxygen Prescription  None    Home at Rest Exercise Oxygen Prescription  None      Initial 6 min Walk   Oxygen Used  None      Program Oxygen Prescription   Program Oxygen Prescription  None      Intervention   Short Term Goals  To learn and understand importance of maintaining oxygen saturations>88%;To learn and demonstrate proper pursed lip breathing techniques or other breathing techniques.;To learn and understand importance of monitoring SPO2 with pulse oximeter and demonstrate accurate use of the pulse oximeter.    Long  Term Goals  Maintenance of O2 saturations>88%;Exhibits proper breathing techniques, such as pursed lip breathing or other method taught during program session;Verbalizes importance of monitoring SPO2 with  pulse oximeter and return demonstration       Oxygen Re-Evaluation: Oxygen Re-Evaluation    Row Name 01/14/19 1211             Program Oxygen Prescription   Program Oxygen Prescription  None         Home Oxygen   Home Oxygen Device  None       Sleep Oxygen Prescription  None       Home Exercise Oxygen Prescription  None       Home at Rest Exercise Oxygen Prescription  None         Goals/Expected Outcomes   Short Term Goals  To learn and understand importance of maintaining oxygen saturations>88%;To learn and demonstrate proper pursed lip breathing techniques or other breathing techniques.;To learn and understand importance of monitoring SPO2 with pulse oximeter and demonstrate accurate use of the pulse oximeter.       Long  Term Goals  Maintenance of O2 saturations>88%;Exhibits proper breathing techniques, such as pursed lip breathing or other method taught during program session;Verbalizes importance of monitoring SPO2 with pulse oximeter and return demonstration       Comments  Reviewed PLB technique with pt.  Talked about how it work and it's important to maintaining his exercise saturations.         Goals/Expected Outcomes  Short: Become more profiecient at using PLB.   Long: Become independent at using PLB.          Oxygen Discharge (Final Oxygen Re-Evaluation): Oxygen Re-Evaluation - 01/14/19 1211      Program Oxygen Prescription   Program Oxygen Prescription  None      Home Oxygen   Home Oxygen Device  None    Sleep Oxygen Prescription  None    Home Exercise Oxygen Prescription  None    Home at Rest Exercise Oxygen Prescription  None      Goals/Expected Outcomes   Short Term Goals  To learn and understand importance of maintaining oxygen saturations>88%;To learn and demonstrate proper pursed lip breathing techniques or other breathing techniques.;To learn and understand importance of monitoring SPO2 with pulse oximeter and demonstrate accurate use of the pulse  oximeter.    Long  Term Goals  Maintenance of O2 saturations>88%;Exhibits proper breathing techniques, such as pursed lip breathing or other method taught during program session;Verbalizes importance of monitoring SPO2 with pulse oximeter and return demonstration    Comments  Reviewed PLB technique with pt.  Talked about how it work and it's important to maintaining his exercise saturations.      Goals/Expected Outcomes  Short: Become more profiecient at using PLB.   Long: Become independent at using PLB.       Initial Exercise Prescription: Initial  Exercise Prescription - 01/06/19 1500      Date of Initial Exercise RX and Referring Provider   Date  01/06/19    Referring Provider  Ida Rogue MD      Treadmill   MPH  2    Grade  0.5    Minutes  15    METs  2.67      NuStep   Level  1    SPM  80    Minutes  15    METs  2      REL-XR   Level  1    Speed  50    Minutes  15    METs  2      Prescription Details   Frequency (times per week)  3    Duration  Progress to 45 minutes of aerobic exercise without signs/symptoms of physical distress      Intensity   THRR 40-80% of Max Heartrate  93-118    Ratings of Perceived Exertion  11-13    Perceived Dyspnea  0-4      Progression   Progression  Continue to progress workloads to maintain intensity without signs/symptoms of physical distress.      Resistance Training   Training Prescription  Yes    Weight  3 lbs    Reps  10-15       Perform Capillary Blood Glucose checks as needed.  Exercise Prescription Changes: Exercise Prescription Changes    Row Name 01/06/19 1500 01/20/19 1400 01/21/19 1200         Response to Exercise   Blood Pressure (Admit)  134/62  126/68  -     Blood Pressure (Exercise)  138/66  142/70  -     Blood Pressure (Exit)  132/62  112/68  -     Heart Rate (Admit)  67 bpm  71 bpm  -     Heart Rate (Exercise)  92 bpm  91 bpm  -     Heart Rate (Exit)  71 bpm  70 bpm  -     Oxygen Saturation  (Admit)  97 %  97 %  -     Oxygen Saturation (Exercise)  94 %  94 %  -     Oxygen Saturation (Exit)  98 %  95 %  -     Rating of Perceived Exertion (Exercise)  11  11  -     Perceived Dyspnea (Exercise)  1  1  -     Symptoms  none  none  -     Comments  walk test results  -  -     Duration  -  Continue with 45 min of aerobic exercise without signs/symptoms of physical distress.  -     Intensity  -  THRR unchanged  -       Progression   Progression  -  Continue to progress workloads to maintain intensity without signs/symptoms of physical distress.  -     Average METs  -  2.93  -       Resistance Training   Training Prescription  -  Yes  -     Weight  -  3 lbs  -     Reps  -  10-15  -       Interval Training   Interval Training  -  No  -       Treadmill   MPH  -  2.5  -  Grade  -  0.5  -     Minutes  -  15  -     METs  -  3.09  -       NuStep   Level  -  1  -     Minutes  -  15  -     METs  -  2.7  -       REL-XR   Level  -  1  -     Minutes  -  15  -     METs  -  3  -       Home Exercise Plan   Plans to continue exercise at  -  -  Home (comment) walking     Frequency  -  -  Add 1 additional day to program exercise sessions.     Initial Home Exercises Provided  -  -  01/21/19        Exercise Comments: Exercise Comments    Row Name 01/14/19 1210           Exercise Comments  First full day of exercise!  Patient was oriented to gym and equipment including functions, settings, policies, and procedures.  Patient's individual exercise prescription and treatment plan were reviewed.  All starting workloads were established based on the results of the 6 minute walk test done at initial orientation visit.  The plan for exercise progression was also introduced and progression will be customized based on patient's performance and goals.          Exercise Goals and Review: Exercise Goals    Row Name 01/06/19 1540             Exercise Goals   Increase Physical  Activity  Yes       Intervention  Provide advice, education, support and counseling about physical activity/exercise needs.;Develop an individualized exercise prescription for aerobic and resistive training based on initial evaluation findings, risk stratification, comorbidities and participant's personal goals.       Expected Outcomes  Short Term: Attend rehab on a regular basis to increase amount of physical activity.;Long Term: Add in home exercise to make exercise part of routine and to increase amount of physical activity.;Long Term: Exercising regularly at least 3-5 days a week.       Increase Strength and Stamina  Yes       Intervention  Provide advice, education, support and counseling about physical activity/exercise needs.;Develop an individualized exercise prescription for aerobic and resistive training based on initial evaluation findings, risk stratification, comorbidities and participant's personal goals.       Expected Outcomes  Short Term: Increase workloads from initial exercise prescription for resistance, speed, and METs.;Long Term: Improve cardiorespiratory fitness, muscular endurance and strength as measured by increased METs and functional capacity (6MWT);Short Term: Perform resistance training exercises routinely during rehab and add in resistance training at home       Able to understand and use rate of perceived exertion (RPE) scale  Yes       Intervention  Provide education and explanation on how to use RPE scale       Expected Outcomes  Short Term: Able to use RPE daily in rehab to express subjective intensity level;Long Term:  Able to use RPE to guide intensity level when exercising independently       Able to understand and use Dyspnea scale  Yes       Intervention  Provide education and explanation  on how to use Dyspnea scale       Expected Outcomes  Short Term: Able to use Dyspnea scale daily in rehab to express subjective sense of shortness of breath during exertion;Long  Term: Able to use Dyspnea scale to guide intensity level when exercising independently       Knowledge and understanding of Target Heart Rate Range (THRR)  Yes       Intervention  Provide education and explanation of THRR including how the numbers were predicted and where they are located for reference       Expected Outcomes  Short Term: Able to state/look up THRR;Short Term: Able to use daily as guideline for intensity in rehab;Long Term: Able to use THRR to govern intensity when exercising independently       Able to check pulse independently  Yes       Intervention  Provide education and demonstration on how to check pulse in carotid and radial arteries.;Review the importance of being able to check your own pulse for safety during independent exercise       Expected Outcomes  Short Term: Able to explain why pulse checking is important during independent exercise;Long Term: Able to check pulse independently and accurately       Understanding of Exercise Prescription  Yes       Intervention  Provide education, explanation, and written materials on patient's individual exercise prescription       Expected Outcomes  Short Term: Able to explain program exercise prescription;Long Term: Able to explain home exercise prescription to exercise independently          Exercise Goals Re-Evaluation : Exercise Goals Re-Evaluation    Row Name 01/14/19 1210 01/20/19 1410 01/21/19 1225         Exercise Goal Re-Evaluation   Exercise Goals Review  Able to understand and use rate of perceived exertion (RPE) scale;Able to understand and use Dyspnea scale;Understanding of Exercise Prescription;Knowledge and understanding of Target Heart Rate Range (THRR)  Increase Physical Activity;Increase Strength and Stamina;Understanding of Exercise Prescription  Increase Physical Activity;Increase Strength and Stamina;Able to understand and use rate of perceived exertion (RPE) scale;Able to understand and use Dyspnea  scale;Knowledge and understanding of Target Heart Rate Range (THRR);Able to check pulse independently;Understanding of Exercise Prescription     Comments  Reviewed RPE scale, THR and program prescription with pt today.  Pt voiced understanding and was given a copy of goals to take home.   Eilene is off to a good start in rehab.  She has been doing well with her workloads and should be able to start to increase workloads and review home exercise guidlines.  We will continue to monitor her progress.   Reviewed home exercise with pt today.  Pt plans to walk at home for exercise.  Reviewed THR, pulse, RPE, sign and symptoms, and when to call 911 or MD.  Also discussed weather considerations and indoor options.  Pt voiced understanding.     Expected Outcomes  Short: Use RPE daily to regulate intensity. Long: Follow program prescription in THR.  Short: Increase workloads and review home exercise guidelines.  Long: Continue to rebuild strength and stamina.   Short: Start to increase intensity of walks to make more intentional for exercise at least one day a week.  Long: Continue to build strength and stamina.         Discharge Exercise Prescription (Final Exercise Prescription Changes): Exercise Prescription Changes - 01/21/19 1200      Home Exercise  Plan   Plans to continue exercise at  Home (comment)   walking   Frequency  Add 1 additional day to program exercise sessions.    Initial Home Exercises Provided  01/21/19       Nutrition:  Target Goals: Understanding of nutrition guidelines, daily intake of sodium <1574m, cholesterol <2056m calories 30% from fat and 7% or less from saturated fats, daily to have 5 or more servings of fruits and vegetables.  Biometrics: Pre Biometrics - 01/06/19 1541      Pre Biometrics   Height  5' 1.5" (1.562 m)    Weight  137 lb 8 oz (62.4 kg)    Waist Circumference  41.5 inches    Hip Circumference  39.5 inches    Waist to Hip Ratio  1.05 %    BMI  (Calculated)  25.56    Single Leg Stand  4.04 seconds        Nutrition Therapy Plan and Nutrition Goals: Nutrition Therapy & Goals - 01/06/19 1518      Personal Nutrition Goals   Nutrition Goal  Lose some weight.    Comments  She feels that her diet is not as good as it used to be. She is states she tries to eat healthy but not as well as she used to. Her weight gain is new and she wants to lsoe a little weight.      Intervention Plan   Intervention  Prescribe, educate and counsel regarding individualized specific dietary modifications aiming towards targeted core components such as weight, hypertension, lipid management, diabetes, heart failure and other comorbidities.;Nutrition handout(s) given to patient.    Expected Outcomes  Short Term Goal: Understand basic principles of dietary content, such as calories, fat, sodium, cholesterol and nutrients.;Long Term Goal: Adherence to prescribed nutrition plan.       Nutrition Assessments: Nutrition Assessments - 01/06/19 1459      MEDFICTS Scores   Pre Score  24       Nutrition Goals Re-Evaluation:   Nutrition Goals Discharge (Final Nutrition Goals Re-Evaluation):   Psychosocial: Target Goals: Acknowledge presence or absence of significant depression and/or stress, maximize coping skills, provide positive support system. Participant is able to verbalize types and ability to use techniques and skills needed for reducing stress and depression.   Initial Review & Psychosocial Screening: Initial Psych Review & Screening - 01/06/19 1516      Initial Review   Current issues with  History of Depression;Current Depression;Current Stress Concerns    Source of Stress Concerns  Chronic Illness;Family    Comments  Her husband died recently and is coping with the loss. She is trying to get used to living alone.      Family Dynamics   Good Support System?  Yes    Comments  She can look to her two kids and three grandsons.      Barriers    Psychosocial barriers to participate in program  The patient should benefit from training in stress management and relaxation.      Screening Interventions   Interventions  Encouraged to exercise;To provide support and resources with identified psychosocial needs;Program counselor consult;Provide feedback about the scores to participant    Expected Outcomes  Short Term goal: Utilizing psychosocial counselor, staff and physician to assist with identification of specific Stressors or current issues interfering with healing process. Setting desired goal for each stressor or current issue identified.;Long Term Goal: Stressors or current issues are controlled or eliminated.;Short Term goal: Identification and  review with participant of any Quality of Life or Depression concerns found by scoring the questionnaire.;Long Term goal: The participant improves quality of Life and PHQ9 Scores as seen by post scores and/or verbalization of changes       Quality of Life Scores:  Scores of 19 and below usually indicate a poorer quality of life in these areas.  A difference of  2-3 points is a clinically meaningful difference.  A difference of 2-3 points in the total score of the Quality of Life Index has been associated with significant improvement in overall quality of life, self-image, physical symptoms, and general health in studies assessing change in quality of life.  PHQ-9: Recent Review Flowsheet Data    Depression screen [facility] 2/9 01/06/2019 05/28/2018 05/27/2017 09/28/2016 07/19/2015   Decreased Interest 1 0 0 0 0   Down, Depressed, Hopeless 0 0 0 0 0   PHQ - 2 Score 1 0 0 0 0   Altered sleeping 1 - - - -   Tired, decreased energy 1 - - - -   Change in appetite 0 - - - -   Feeling bad or failure about yourself  0 - - - -   Trouble concentrating 0 - - - -   Moving slowly or fidgety/restless 0 - - - -   Suicidal thoughts 0 - - - -   PHQ-9 Score 3 - - - -   Difficult doing work/chores Not difficult at all  - - - -     Interpretation of Total Score  Total Score Depression Severity:  1-4 = Minimal depression, 5-9 = Mild depression, 10-14 = Moderate depression, 15-19 = Moderately severe depression, 20-27 = Severe depression   Psychosocial Evaluation and Intervention: Psychosocial Evaluation - 01/28/19 1237      Psychosocial Evaluation & Interventions   Interventions  Stress management education;Encouraged to exercise with the program and follow exercise prescription    Comments  Counselor met with Ms. [place] Jaclyn Shaggy) today for initial psychosocial evaluation.    She just turned 85 years old two days ago and has been experiencing shortness of breath.  Filippa has a strong support system with her son and his family; a daughter and her family and some friends.  She is not sleeping well and doesn't have much of an appetite since her spouse died 13 months ago.  She denies a history of depression or anxiety and her mood is generally positive.  Her primary stress at this time is learning to live alone and her current health condition.  She has goals to improve her breathing in order to return to the [employer] class.   Staff will follow with her.     Expected Outcomes  Short:  Harumi will exercise consistently for her health and to provide some social interactions while she is missing so many loved ones.  Long:  Samantha will get back into [employer] and continue to exercise for her health and mental health.      Continue Psychosocial Services   Follow up required by staff       Psychosocial Re-Evaluation:   Psychosocial Discharge (Final Psychosocial Re-Evaluation):   Education: Education Goals: Education classes will be provided on a weekly basis, covering required topics. Participant will state understanding/return demonstration of topics presented.  Learning Barriers/Preferences: Learning Barriers/Preferences - 01/06/19 1516      Learning Barriers/Preferences   Learning Barriers  Hearing     Learning Preferences  None  Education Topics:  Initial Evaluation Education: - Verbal, written and demonstration of respiratory meds, oximetry and breathing techniques. Instruction on use of nebulizers and MDIs and importance of monitoring MDI activations.   Pulmonary Rehab from 01/30/2019 in [facility] Cardiac and Pulmonary Rehab  Date  01/06/19  Educator  [facility]  Instruction Review Code  1- Verbalizes Understanding      General Nutrition Guidelines/Fats and Fiber: -Group instruction provided by verbal, written material, models and posters to present the general guidelines for heart healthy nutrition. Gives an explanation and review of dietary fats and fiber.   Controlling Sodium/Reading Food Labels: -Group verbal and written material supporting the discussion of sodium use in heart healthy nutrition. Review and explanation with models, verbal and written materials for utilization of the food label.   Exercise Physiology & General Exercise Guidelines: - Group verbal and written instruction with models to review the exercise physiology of the cardiovascular system and associated critical values. Provides general exercise guidelines with specific guidelines to those with heart or lung disease.    Pulmonary Rehab from 01/30/2019 in [facility] Cardiac and Pulmonary Rehab  Date  01/14/19  Educator  [facility]  Instruction Review Code  1- Verbalizes Understanding      Aerobic Exercise & Resistance Training: - Gives group verbal and written instruction on the various components of exercise. Focuses on aerobic and resistive training programs and the benefits of this training and how to safely progress through these programs.   Pulmonary Rehab from 01/30/2019 in [facility] Cardiac and Pulmonary Rehab  Date  01/16/19  Educator  [facility]  Instruction Review Code  1- Verbalizes Understanding      Flexibility, Balance, Mind/Body Relaxation: Provides group verbal/written instruction on the benefits of flexibility and  balance training, including mind/body exercise modes such as yoga, pilates and tai chi.  Demonstration and skill practice provided.   Pulmonary Rehab from 01/30/2019 in [facility] Cardiac and Pulmonary Rehab  Date  01/21/19  Educator  [facility]  Instruction Review Code  1- Verbalizes Understanding      Stress and Anxiety: - Provides group verbal and written instruction about the health risks of elevated stress and causes of high stress.  Discuss the correlation between heart/lung disease and anxiety and treatment options. Review healthy ways to manage with stress and anxiety.   Pulmonary Rehab from 01/30/2019 in [facility] Cardiac and Pulmonary Rehab  Date  01/28/19  Educator  [facility]  Instruction Review Code  1- Verbalizes Understanding      Depression: - Provides group verbal and written instruction on the correlation between heart/lung disease and depressed mood, treatment options, and the stigmas associated with seeking treatment.   Exercise & Equipment Safety: - Individual verbal instruction and demonstration of equipment use and safety with use of the equipment.   Pulmonary Rehab from 01/30/2019 in [facility] Cardiac and Pulmonary Rehab  Date  01/06/19  Educator  [facility]  Instruction Review Code  1- Verbalizes Understanding      Infection Prevention: - Provides verbal and written material to individual with discussion of infection control including proper hand washing and proper equipment cleaning during exercise session.   Pulmonary Rehab from 01/30/2019 in [facility] Cardiac and Pulmonary Rehab  Date  01/06/19  Educator  [facility]  Instruction Review Code  1- Verbalizes Understanding      Falls Prevention: - Provides verbal and written material to individual with discussion of falls prevention and safety.   Pulmonary Rehab from 01/30/2019 in [facility] Cardiac and Pulmonary Rehab  Date  01/06/19  Educator  [facility]  Instruction Review Code  1- Verbalizes Understanding      Diabetes: - Individual verbal and written instruction to  review signs/symptoms of diabetes, desired ranges of glucose level fasting, after meals and with exercise. Advice that pre and post exercise glucose checks will be done for 3 sessions at entry of program.   Chronic Lung Diseases: - Group verbal and written instruction to review updates, respiratory medications, advancements in procedures and treatments. Discuss use of supplemental oxygen including available portable oxygen systems, continuous and intermittent flow rates, concentrators, personal use and safety guidelines. Review proper use of inhaler and spacers. Provide informative websites for self-education.    Pulmonary Rehab from 01/30/2019 in [facility] Cardiac and Pulmonary Rehab  Date  01/30/19  Educator  [facility]  Instruction Review Code  1- Verbalizes Understanding      Energy Conservation: - Provide group verbal and written instruction for methods to conserve energy, plan and organize activities. Instruct on pacing techniques, use of adaptive equipment and posture/positioning to relieve shortness of breath.   Triggers and Exacerbations: - Group verbal and written instruction to review types of environmental triggers and ways to prevent exacerbations. Discuss weather changes, air quality and the benefits of nasal washing. Review warning signs and symptoms to help prevent infections. Discuss techniques for effective airway clearance, coughing, and vibrations.   AED/CPR: - Group verbal and written instruction with the use of models to demonstrate the basic use of the AED with the basic ABC's of resuscitation.   Anatomy and Physiology of the Lungs: - Group verbal and written instruction with the use of models to provide basic lung anatomy and physiology related to function, structure and complications of lung disease.   Anatomy & Physiology of the Heart: - Group verbal and written instruction and models provide basic cardiac anatomy and physiology, with the coronary electrical and arterial  systems. Review of Valvular disease and Heart Failure   Cardiac Medications: - Group verbal and written instruction to review commonly prescribed medications for heart disease. Reviews the medication, class of the drug, and side effects.   Know Your Numbers and Risk Factors: -Group verbal and written instruction about important numbers in your health.  Discussion of what are risk factors and how they play a role in the disease process.  Review of Cholesterol, Blood Pressure, Diabetes, and BMI and the role they play in your overall health.   Sleep Hygiene: -Provides group verbal and written instruction about how sleep can affect your health.  Define sleep hygiene, discuss sleep cycles and impact of sleep habits. Review good sleep hygiene tips.    Other: -Provides group and verbal instruction on various topics (see comments)    Knowledge Questionnaire Score: Knowledge Questionnaire Score - 01/06/19 1500      Knowledge Questionnaire Score   Pre Score  15/18   reviewed with patient       Core Components/Risk Factors/Patient Goals at Admission: Personal Goals and Risk Factors at Admission - 01/06/19 1509      Core Components/Risk Factors/Patient Goals on Admission    Weight Management  Yes;Weight Maintenance    Intervention  Weight Management: Provide education and appropriate resources to help participant work on and attain dietary goals.;Weight Management: Develop a combined nutrition and exercise program designed to reach desired caloric intake, while maintaining appropriate intake of nutrient and fiber, sodium and fats, and appropriate energy expenditure required for the weight goal.;Weight Management/Obesity: Establish reasonable short term and long term weight goals.  Admit Weight  137 lb (62.1 kg)    Goal Weight: Short Term  132 lb (59.9 kg)    Goal Weight: Long Term  130 lb (59 kg)    Expected Outcomes  Short Term: Continue to assess and modify interventions until short  term weight is achieved;Long Term: Adherence to nutrition and physical activity/exercise program aimed toward attainment of established weight goal;Weight Maintenance: Understanding of the daily nutrition guidelines, which includes 25-35% calories from fat, 7% or less cal from saturated fats, less than 237m cholesterol, less than 1.5gm of sodium, & 5 or more servings of fruits and vegetables daily;Understanding recommendations for meals to include 15-35% energy as protein, 25-35% energy from fat, 35-60% energy from carbohydrates, less than 2043mof dietary cholesterol, 20-35 gm of total fiber daily;Understanding of distribution of calorie intake throughout the day with the consumption of 4-5 meals/snacks    Improve shortness of breath with ADL's  Yes    Intervention  Provide education, individualized exercise plan and daily activity instruction to help decrease symptoms of SOB with activities of daily living.    Expected Outcomes  Short Term: Improve cardiorespiratory fitness to achieve a reduction of symptoms when performing ADLs;Long Term: Be able to perform more ADLs without symptoms or delay the onset of symptoms    Hypertension  Yes    Intervention  Provide education on lifestyle modifcations including regular physical activity/exercise, weight management, moderate sodium restriction and increased consumption of fresh fruit, vegetables, and low fat dairy, alcohol moderation, and smoking cessation.;Monitor prescription use compliance.    Expected Outcomes  Short Term: Continued assessment and intervention until BP is < 140/9029mG in hypertensive participants. < 130/40m57m in hypertensive participants with diabetes, heart failure or chronic kidney disease.;Long Term: Maintenance of blood pressure at goal levels.    Lipids  Yes    Intervention  Provide education and support for participant on nutrition & aerobic/resistive exercise along with prescribed medications to achieve LDL <70mg73mL >40mg.20m Expected Outcomes  Short Term: Participant states understanding of desired cholesterol values and is compliant with medications prescribed. Participant is following exercise prescription and nutrition guidelines.;Long Term: Cholesterol controlled with medications as prescribed, with individualized exercise RX and with personalized nutrition plan. Value goals: LDL < 70mg, 41m> 40 mg.       Core Components/Risk Factors/Patient Goals Review:    Core Components/Risk Factors/Patient Goals at Discharge (Final Review):    ITP Comments: ITP Comments    Row Name 01/06/19 1431 02/02/19 0845         ITP Comments  Medical Evaluation completed. Chart sent for review and changes to Dr. Mark [facility]ily [facility] can be found in [facility] encounter 11/13/2018  30 day review completed. ITP sent to Dr. Mark [facility] with ITP unless changes are made by physician.         Comments: 30 day review

## 2019-02-02 NOTE — Progress Notes (Signed)
Daily Session Note  Patient Details  Name: Elizabeth [name] MRN: [id number] Date of Birth: June 11, 1927 Referring Provider:     Pulmonary Rehab from 01/06/2019 in [facility] Cardiac and Pulmonary Rehab  Referring Provider  Ida Rogue MD      Encounter Date: 02/02/2019  Check In:      Social History   Tobacco Use  Smoking Status Never Smoker  Smokeless Tobacco Never Used    Goals Met:  Proper associated with RPD/PD & O2 Sat Independence with exercise equipment Exercise tolerated well No report of cardiac concerns or symptoms Strength training completed today  Goals Unmet:  Not Applicable  Comments: Pt able to follow exercise prescription today without complaint.  Will continue to monitor for progression.    Dr. Emily Filbert is Medical Director for [place] and [facility].

## 2019-02-04 ENCOUNTER — Encounter: Payer: Medicare Other | Admitting: Obstetrics and Gynecology

## 2019-02-04 DIAGNOSIS — R0609 Other forms of dyspnea: Principal | ICD-10-CM

## 2019-02-04 NOTE — Progress Notes (Signed)
Daily Session Note  Patient Details  Name: LISAANNE LAWRIE MRN: [id number] Date of Birth: [birth year] Referring Provider:     Pulmonary Rehab from 01/06/2019 in [facility] Cardiac and Pulmonary Rehab  Referring Provider  Ida Rogue MD      Encounter Date: 02/04/2019  Check In: Session Check In - 02/04/19 1140      Check-In   Supervising physician immediately available to respond to emergencies  [facility] immediately available ER MD    Physician(s)  Dr. Kerman Passey and Dr. Mariea Clonts    Staff Present  Jasper Loser BS, Exercise Physiologist;Jessica Luan Pulling, MA, RCEP, CCRP, Exercise Physiologist;Joseph Tessie Fass RCP,RRT,BSRT    Medication changes reported      No    Fall or balance concerns reported     No    Tobacco Cessation  No Change    Warm-up and Cool-down  Performed as group-led instruction    Resistance Training Performed  Yes    VAD Patient?  No    PAD/SET Patient?  No      Pain Assessment   Currently in Pain?  No/denies    Multiple Pain Sites  No          Social History   Tobacco Use  Smoking Status Never Smoker  Smokeless Tobacco Never Used    Goals Met:  Proper associated with RPD/PD & O2 Sat Independence with exercise equipment Exercise tolerated well Strength training completed today  Goals Unmet:  Not Applicable  Comments: Pt able to follow exercise prescription today without complaint.  Will continue to monitor for progression.    Dr. Emily Filbert is Medical Director for [place] and [facility].

## 2019-02-06 ENCOUNTER — Encounter: Payer: Medicare Other | Admitting: *Deleted

## 2019-02-06 DIAGNOSIS — R0609 Other forms of dyspnea: Principal | ICD-10-CM

## 2019-02-06 NOTE — Progress Notes (Signed)
Daily Session Note  Patient Details  Name: Elizabeth [name] MRN: [id number] Date of Birth: 28-Dec-1926 Referring Provider:     Pulmonary Rehab from 01/06/2019 in [facility] Cardiac and Pulmonary Rehab  Referring Provider  Ida Rogue MD      Encounter Date: 02/06/2019  Check In: Session Check In - 02/06/19 1222      Check-In   Supervising physician immediately available to respond to emergencies  [facility] immediately available ER MD    Physician(s)  Dr. Cherylann Banas and Clearnce Hasten    Location  [name]    Staff Present  Renita Papa, RN BSN;Jessica Luan Pulling, MA, RCEP, CCRP, Exercise Physiologist;Joseph Tessie Fass RCP,RRT,BSRT    Medication changes reported      No    Fall or balance concerns reported     No    Tobacco Cessation  No Change    Warm-up and Cool-down  Performed as group-led instruction    Resistance Training Performed  Yes    VAD Patient?  No    PAD/SET Patient?  No      Pain Assessment   Currently in Pain?  No/denies          Social History   Tobacco Use  Smoking Status Never Smoker  Smokeless Tobacco Never Used    Goals Met:  Proper associated with RPD/PD & O2 Sat Independence with exercise equipment Using PLB without cueing & demonstrates good technique Exercise tolerated well No report of cardiac concerns or symptoms Strength training completed today  Goals Unmet:  Not Applicable  Comments: Pt able to follow exercise prescription today without complaint.  Will continue to monitor for progression.    Dr. Emily Filbert is Medical Director for [place] and [facility].

## 2019-02-09 DIAGNOSIS — R0609 Other forms of dyspnea: Secondary | ICD-10-CM | POA: Diagnosis not present

## 2019-02-09 NOTE — Progress Notes (Signed)
Daily Session Note  Patient Details  Name: Elizabeth [name] MRN: [id number] Date of Birth: 07/31/1927 Referring Provider:     Pulmonary Rehab from 01/06/2019 in [facility] Cardiac and Pulmonary Rehab  Referring Provider  Ida Rogue MD      Encounter Date: 02/09/2019  Check In:      Social History   Tobacco Use  Smoking Status Never Smoker  Smokeless Tobacco Never Used    Goals Met:  Proper associated with RPD/PD & O2 Sat Independence with exercise equipment Exercise tolerated well Strength training completed today  Goals Unmet:  Not Applicable  Comments: Pt able to follow exercise prescription today without complaint.  Will continue to monitor for progression.    Dr. Emily Filbert is Medical Director for [place] and [facility].

## 2019-02-11 ENCOUNTER — Ambulatory Visit: Payer: Medicare Other | Admitting: Internal Medicine

## 2019-02-11 ENCOUNTER — Encounter: Payer: Self-pay | Admitting: Internal Medicine

## 2019-02-11 ENCOUNTER — Encounter: Payer: Self-pay | Admitting: Obstetrics and Gynecology

## 2019-02-11 ENCOUNTER — Ambulatory Visit (INDEPENDENT_AMBULATORY_CARE_PROVIDER_SITE_OTHER): Payer: Medicare Other | Admitting: Obstetrics and Gynecology

## 2019-02-11 VITALS — BP 125/74 | HR 65 | Ht 61.5 in | Wt 139.7 lb

## 2019-02-11 DIAGNOSIS — Z9109 Other allergy status, other than to drugs and biological substances: Secondary | ICD-10-CM | POA: Diagnosis not present

## 2019-02-11 DIAGNOSIS — R42 Dizziness and giddiness: Secondary | ICD-10-CM

## 2019-02-11 DIAGNOSIS — F439 Reaction to severe stress, unspecified: Secondary | ICD-10-CM

## 2019-02-11 DIAGNOSIS — Z4689 Encounter for fitting and adjustment of other specified devices: Secondary | ICD-10-CM

## 2019-02-11 DIAGNOSIS — Z960 Presence of urogenital implants: Secondary | ICD-10-CM

## 2019-02-11 DIAGNOSIS — K219 Gastro-esophageal reflux disease without esophagitis: Secondary | ICD-10-CM

## 2019-02-11 DIAGNOSIS — E7800 Pure hypercholesterolemia, unspecified: Secondary | ICD-10-CM

## 2019-02-11 DIAGNOSIS — N952 Postmenopausal atrophic vaginitis: Secondary | ICD-10-CM | POA: Diagnosis not present

## 2019-02-11 DIAGNOSIS — I10 Essential (primary) hypertension: Secondary | ICD-10-CM

## 2019-02-11 DIAGNOSIS — I471 Supraventricular tachycardia: Secondary | ICD-10-CM

## 2019-02-11 DIAGNOSIS — N814 Uterovaginal prolapse, unspecified: Secondary | ICD-10-CM

## 2019-02-11 NOTE — Progress Notes (Addendum)
Patient ID: Elizabeth [name], female   DOB: [birth year], 85 y.o.   MRN: [id number]   Subjective:    Patient ID: Elizabeth [name], female    DOB: 1927/11/28, 85 y.o.   MRN: [id number]  HPI  Patient here for a scheduled follow up.  Recently evaluated by cardiology.  Just evaluated 12/22/18 for f/u SVT.  Previous stress test ok.  ECHO 12/05/18 - EF 60-65%, trivial AI, mild MR left atrium normal in size.  Outpatient monitoring reveals SVT.  Was placed on lopressor.  Referred to pulmonary rehab.  Still some issues with noticed of palpitations, may be some better.  No chest pain.  Still tries to stay active.  No acid reflux.  Bowels stable.  Has a history of vertigo.  States has been noticing a heavy head sensation.  Discussed possible sinus issues.  Discussed taking zyrtec and using nasacort.  Has seen ENT for evaluation of vertigo.  Still have intermittent issues with vertigo.  This feels different.  Discussed further w/up.  Desires neurology evaluation.     Past Medical History:  Diagnosis Date  . Cystocele or rectocele with incomplete uterine prolapse   . Hyperlipidemia   . Hypertension   . Irritable bowel syndrome   . Palpitations   . Vaginal atrophy    Past Surgical History:  Procedure Laterality Date  . CATARACT EXTRACTION  2006   Family History  Problem Relation Age of Onset  . Transient ischemic attack Mother        multiple   . Dementia Mother   . Stroke Father   . Heart disease Brother        MI - died age 56  . Leukemia Brother   . Lung cancer Brother   . Throat cancer Brother        had heart disease also  . Heart disease Sister   . Dementia Sister    Social History   Socioeconomic History  . Marital status: Married    Spouse name: Not on file  . Number of children: 2  . Years of education: Not on file  . Highest education level: Not on file  Occupational History  . Not on file  Social Needs  . Financial resource strain: Not on file  . Food insecurity:    Worry: Not  on file    Inability: Not on file  . Transportation needs:    Medical: Not on file    Non-medical: Not on file  Tobacco Use  . Smoking status: Never Smoker  . Smokeless tobacco: Never Used  Substance and Sexual Activity  . Alcohol use: No    Alcohol/week: 0.0 standard drinks  . Drug use: No  . Sexual activity: Not Currently    Birth control/protection: None  Lifestyle  . Physical activity:    Days per week: Not on file    Minutes per session: Not on file  . Stress: Not on file  Relationships  . Social connections:    Talks on phone: Not on file    Gets together: Not on file    Attends religious service: Not on file    Active member of club or organization: Not on file    Attends meetings of clubs or organizations: Not on file    Relationship status: Not on file  Other Topics Concern  . Not on file  Social History Narrative  . Not on file    Outpatient Encounter Medications as of 02/11/2019  Medication Sig  .  ACCUPRIL 40 MG tablet Take 1 tablet (40 mg total) by mouth daily.  Marland Kitchen aspirin 81 MG tablet Take 81 mg by mouth daily.  . cetirizine (ZYRTEC) 10 MG tablet Take 10 mg by mouth daily as needed.   . Cholecalciferol (VITAMIN D-3) 1000 UNITS CAPS Take by mouth.  . estradiol (ESTRACE) 0.1 MG/GM vaginal cream PLACE 1 APPLICATORFUL VAGINALLY 2 TIMES A WEEK  . hydrALAZINE (APRESOLINE) 25 MG tablet Take 1 tablet (25 mg total) by mouth 3 (three) times daily as needed.  Marland Kitchen LIPITOR 40 MG tablet TAKE 1 TABLET BY MOUTH ONCE DAILY  . magnesium oxide (MAG-OX) 400 (241.3 Mg) MG tablet TAKE 1 TABLET BY MOUTH ONCE DAILY  . metoprolol tartrate (LOPRESSOR) 25 MG tablet Take 1 tablet (25 mg total) by mouth 2 (two) times daily.  Marland Kitchen triamcinolone (NASACORT) 55 MCG/ACT nasal inhaler Place 2 sprays into the nose daily as needed.    No facility-administered encounter medications on file as of 02/11/2019.     Review of Systems  Constitutional: Negative for appetite change and fever.  HENT:  Positive for congestion. Negative for sinus pressure.   Respiratory: Negative for cough, chest tightness and shortness of breath.   Cardiovascular: Negative for chest pain, palpitations and leg swelling.  Gastrointestinal: Negative for abdominal pain, diarrhea, nausea and vomiting.  Genitourinary: Negative for difficulty urinating and dysuria.  Musculoskeletal: Negative for myalgias and neck pain.  Skin: Negative for color change and rash.  Neurological:       Intermittent dizziness and heavy headed sensation.   Psychiatric/Behavioral: Negative for agitation and dysphoric mood.       Objective:    Physical Exam Constitutional:      General: She is not in acute distress.    Appearance: Normal appearance.  HENT:     Nose: Nose normal. No congestion.     Mouth/Throat:     Pharynx: No oropharyngeal exudate or posterior oropharyngeal erythema.  Neck:     Musculoskeletal: Neck supple. No muscular tenderness.     Thyroid: No thyromegaly.  Cardiovascular:     Rate and Rhythm: Normal rate and regular rhythm.  Pulmonary:     Effort: No respiratory distress.     Breath sounds: Normal breath sounds. No wheezing.  Abdominal:     General: Bowel sounds are normal.     Palpations: Abdomen is soft.     Tenderness: There is no abdominal tenderness.  Musculoskeletal:        General: No swelling or tenderness.  Lymphadenopathy:     Cervical: No cervical adenopathy.  Skin:    Findings: No erythema or rash.  Neurological:     Mental Status: She is alert.  Psychiatric:        Mood and Affect: Mood normal.        Behavior: Behavior normal.     BP (!) 130/50   Pulse 71   Temp 97.7 F (36.5 C) (Oral)   Wt 139 lb 6.4 oz (63.2 kg)   SpO2 96%   BMI 25.91 kg/m  Wt Readings from Last 3 Encounters:  02/11/19 139 lb 11.2 oz (63.4 kg)  02/11/19 139 lb 6.4 oz (63.2 kg)  01/06/19 137 lb 8 oz (62.4 kg)     Lab Results  Component Value Date   WBC 7.3 02/10/2018   HGB 14.6 02/10/2018    HCT 44.3 02/10/2018   PLT 245.0 02/10/2018   GLUCOSE 107 (H) 10/15/2018   CHOL 170 10/15/2018   TRIG 103.0 10/15/2018  HDL 57.00 10/15/2018   LDLDIRECT 178.4 11/04/2013   LDLCALC 92 10/15/2018   ALT 18 10/15/2018   AST 18 10/15/2018   NA 133 (L) 10/15/2018   K 4.6 10/15/2018   CL 101 10/15/2018   CREATININE 1.04 10/15/2018   BUN 19 10/15/2018   CO2 24 10/15/2018   TSH 3.78 02/10/2018       Assessment & Plan:   Problem List Items Addressed This Visit    Dizziness    Has seen ENT.  Has a history of vertigo.  Current symptoms feel different.  Has seen cardiology.  No association with SVT.  Describes heavy head sensation.  Discussed further evaluation.  Discussed scan.  Desires referral to neurology for further evaluation.        Relevant Orders   Ambulatory referral to Neurology   Environmental allergies    Zyrtec and nasacort as directed.  Follow.        GERD (gastroesophageal reflux disease)    No upper symptoms reported.       Hypercholesterolemia    On lipitor.  Low cholesterol diet and exercise.  Follow lipid panel and liver function tests.        Relevant Orders   Lipid panel   Hepatic function panel   Hypertension    Blood pressure under good control.  Continue same medication regimen.  Follow pressures.  Follow metabolic panel.        Relevant Orders   CBC with Differential/Platelet   TSH   Basic metabolic panel   Stress    Increased stress as outlined.  Dealing with her husband's death.  Overall feels she is handling things relatively well.        SVT (supraventricular tachycardia) (HCC)    Saw cardiology.  Outpatient monitoring revealed SVT.  On lopressor.  Desires to continue.  Hold on any further intervention.  Follow.        Vaginal pessary in situ    Followed and changed by gyn.  Due for f/u today.            Dale Durhamharlene Kristian Hazzard, MD

## 2019-02-11 NOTE — Progress Notes (Signed)
    GYNECOLOGY PROGRESS NOTE  Subjective:    Patient ID: Elizabeth [name], female    DOB: 1927/12/12, 85 y.o.   MRN: [id number]  HPI  Patient is a 85 y.o. G93P2002 female who presents for pessary check. Notes that she is doing fairly well today.   She denies pelvic discomfort, difficulty urinating.  She is excited that she has just had another birthday.  She is noting some issues with vaginal spotting/bleeding, but notes that it is most often occurring when she is trying to apply her cream.  She also notes that she has not been as consistent with her applications, may have been at least 1 month since last use.   The following portions of the patient's history were reviewed and updated as appropriate: allergies, current medications, past family history, past medical history, past social history, past surgical history and problem list.   Review of Systems Pertinent items noted in HPI and remainder of comprehensive ROS otherwise negative.  Objective:    Blood pressure 125/74, pulse 65, height 5' 1.5" (1.562 m), weight 139 lb 11.2 oz (63.4 kg). General appearance: alert and no distress Pelvis: The patient's  Size 2 3/4 Gelhorn pessary was removed, cleaned, and re-inserted today.  Speculum examination revealed mildly atrophic vaginal mucosa with no lesions or lacerations.  Scant amount of thin watery white-yellow discharge, no odor.   Assessment:   Pessary maintenance Vaginal atrophy Cystocele grade 3, Uterine prolapse Grade 1   Plan:   - Pessary maintenance.  -  Continue use of estrogen cream once weekly.  Continue use the Trimo-san gel when she needed once every 2-3 weeks. Strongly encouraged compliance as atrophy may be causing some of her spotting. Also discussed different techniques for application to decrease vaginal trauma which can also be a cause for bleeding.  - Return to clinic in 8-10 weeks.    Hildred Laser, MD [facility]

## 2019-02-11 NOTE — Progress Notes (Signed)
Pt is present today for pessary check. Pt stated that the pessary is doing well.

## 2019-02-14 ENCOUNTER — Encounter: Payer: Self-pay | Admitting: Internal Medicine

## 2019-02-14 NOTE — Assessment & Plan Note (Signed)
On lipitor.  Low cholesterol diet and exercise.  Follow lipid panel and liver function tests.   

## 2019-02-14 NOTE — Assessment & Plan Note (Signed)
No upper symptoms reported.   

## 2019-02-14 NOTE — Assessment & Plan Note (Signed)
Increased stress as outlined.  Dealing with her husband's death.  Overall feels she is handling things relatively well.

## 2019-02-14 NOTE — Assessment & Plan Note (Signed)
Has seen ENT.  Has a history of vertigo.  Current symptoms feel different.  Has seen cardiology.  No association with SVT.  Describes heavy head sensation.  Discussed further evaluation.  Discussed scan.  Desires referral to neurology for further evaluation.

## 2019-02-14 NOTE — Assessment & Plan Note (Signed)
Zyrtec and nasacort as directed.  Follow.

## 2019-02-14 NOTE — Assessment & Plan Note (Signed)
Followed and changed by gyn.  Due for f/u today.

## 2019-02-14 NOTE — Assessment & Plan Note (Signed)
Blood pressure under good control.  Continue same medication regimen.  Follow pressures.  Follow metabolic panel.   

## 2019-02-14 NOTE — Addendum Note (Signed)
Addended by: Charm Barges on: 02/14/2019 01:44 PM   Modules accepted: Orders

## 2019-02-14 NOTE — Assessment & Plan Note (Signed)
Saw cardiology.  Outpatient monitoring revealed SVT.  On lopressor.  Desires to continue.  Hold on any further intervention.  Follow.

## 2019-02-16 DIAGNOSIS — R0609 Other forms of dyspnea: Principal | ICD-10-CM

## 2019-02-16 NOTE — Progress Notes (Signed)
Daily Session Note  Patient Details  Name: NYISHA CLIPPARD MRN: [id number] Date of Birth: 1927-10-04 Referring Provider:     Pulmonary Rehab from 01/06/2019 in [facility] Cardiac and Pulmonary Rehab  Referring Provider  Ida Rogue MD      Encounter Date: 02/16/2019  Check In:      Social History   Tobacco Use  Smoking Status Never Smoker  Smokeless Tobacco Never Used    Goals Met:  Proper associated with RPD/PD & O2 Sat Independence with exercise equipment Exercise tolerated well Strength training completed today  Goals Unmet:  Not Applicable  Comments: Pt able to follow exercise prescription today without complaint.  Will continue to monitor for progression.    Dr. Emily Filbert is Medical Director for [place] and [facility].

## 2019-02-17 ENCOUNTER — Other Ambulatory Visit: Payer: Self-pay | Admitting: Internal Medicine

## 2019-02-18 ENCOUNTER — Encounter: Payer: Medicare Other | Admitting: *Deleted

## 2019-02-18 DIAGNOSIS — R0609 Other forms of dyspnea: Principal | ICD-10-CM

## 2019-02-18 NOTE — Progress Notes (Signed)
Daily Session Note  Patient Details  Name: Elizabeth [name] MRN: [id number] Date of Birth: [birth year] Referring Provider:     Pulmonary Rehab from 01/06/2019 in [facility] Cardiac and Pulmonary Rehab  Referring Provider  Ida Rogue MD      Encounter Date: 02/18/2019  Check In: Session Check In - 02/18/19 1117      Check-In   Supervising physician immediately available to respond to emergencies  [facility] immediately available ER MD    Physician(s)  Drs. Guadalupe Dawn    Location  [name]    Staff Present  Alberteen Sam, MA, RCEP, CCRP, Exercise Physiologist;Joseph Alcus Dad, RN BSN    Medication changes reported      No    Fall or balance concerns reported     No    Warm-up and Cool-down  Performed as group-led Higher education careers adviser Performed  Yes    VAD Patient?  No    PAD/SET Patient?  No      Pain Assessment   Currently in Pain?  No/denies          Social History   Tobacco Use  Smoking Status Never Smoker  Smokeless Tobacco Never Used    Goals Met:  Proper associated with RPD/PD & O2 Sat Independence with exercise equipment Using PLB without cueing & demonstrates good technique Exercise tolerated well No report of cardiac concerns or symptoms Strength training completed today  Goals Unmet:  Not Applicable  Comments: Pt able to follow exercise prescription today without complaint.  Will continue to monitor for progression.    Dr. Emily Filbert is Medical Director for [place] and [facility].

## 2019-02-20 DIAGNOSIS — R0609 Other forms of dyspnea: Secondary | ICD-10-CM | POA: Diagnosis not present

## 2019-02-20 NOTE — Progress Notes (Signed)
Daily Session Note  Patient Details  Name: Elizabeth [name] MRN: [id number] Date of Birth: 07-13-1927 Referring Provider:     Pulmonary Rehab from 01/06/2019 in [facility] Cardiac and Pulmonary Rehab  Referring Provider  Ida Rogue MD      Encounter Date: 02/20/2019  Check In: Session Check In - 02/20/19 1145      Check-In   Supervising physician immediately available to respond to emergencies  [facility] immediately available ER MD    Physician(s)  Drs. Cinda Quest and [employer]    Location  [name]    Staff Present  Alberteen Sam, MA, RCEP, CCRP, Exercise Physiologist;Chinwe Lope Alcus Dad, RN BSN    Medication changes reported      No    Fall or balance concerns reported     No    Warm-up and Cool-down  Performed as group-led Higher education careers adviser Performed  Yes    VAD Patient?  No    PAD/SET Patient?  No      Pain Assessment   Currently in Pain?  No/denies          Social History   Tobacco Use  Smoking Status Never Smoker  Smokeless Tobacco Never Used    Goals Met:  Independence with exercise equipment Exercise tolerated well No report of cardiac concerns or symptoms Strength training completed today  Goals Unmet:  Not Applicable  Comments: Pt able to follow exercise prescription today without complaint.  Will continue to monitor for progression.    Dr. Emily Filbert is Medical Director for [place] and [facility].

## 2019-02-23 ENCOUNTER — Encounter: Payer: Medicare Other | Attending: Cardiovascular Disease

## 2019-02-23 DIAGNOSIS — R0609 Other forms of dyspnea: Secondary | ICD-10-CM | POA: Insufficient documentation

## 2019-02-23 NOTE — Progress Notes (Signed)
Daily Session Note  Patient Details  Name: Elizabeth [name] MRN: [id number] Date of Birth: 1927-07-21 Referring Provider:     Pulmonary Rehab from 01/06/2019 in [facility] Cardiac and Pulmonary Rehab  Referring Provider  Ida Rogue MD      Encounter Date: 02/23/2019  Check In:      Social History   Tobacco Use  Smoking Status Never Smoker  Smokeless Tobacco Never Used    Goals Met:  Independence with exercise equipment Exercise tolerated well No report of cardiac concerns or symptoms Strength training completed today  Goals Unmet:  Not Applicable  Comments: Pt able to follow exercise prescription today without complaint.  Will continue to monitor for progression.    Dr. Emily Filbert is Medical Director for [place] and [facility].

## 2019-02-25 ENCOUNTER — Encounter: Payer: Medicare Other | Admitting: *Deleted

## 2019-02-25 DIAGNOSIS — R0609 Other forms of dyspnea: Secondary | ICD-10-CM | POA: Diagnosis not present

## 2019-02-25 NOTE — Progress Notes (Signed)
Daily Session Note  Patient Details  Name: Elizabeth [name] MRN: [id number] Date of Birth: March 21, 1927 Referring Provider:     Pulmonary Rehab from 01/06/2019 in [facility] Cardiac and Pulmonary Rehab  Referring Provider  Ida Rogue MD      Encounter Date: 02/25/2019  Check In: Session Check In - 02/25/19 1132      Check-In   Supervising physician immediately available to respond to emergencies  [facility] immediately available ER MD    Physician(s)  Dr. Corky Downs and Joni Fears    Location  [name]    Staff Present  Renita Papa, RN BSN;Jessica Luan Pulling, MA, RCEP, CCRP, Exercise Physiologist;Joseph Tessie Fass RCP,RRT,BSRT    Medication changes reported      No    Fall or balance concerns reported     No    Tobacco Cessation  No Change    Warm-up and Cool-down  Performed as group-led instruction    Resistance Training Performed  Yes    VAD Patient?  No    PAD/SET Patient?  No      Pain Assessment   Currently in Pain?  No/denies          Social History   Tobacco Use  Smoking Status Never Smoker  Smokeless Tobacco Never Used    Goals Met:  Proper associated with RPD/PD & O2 Sat Independence with exercise equipment Using PLB without cueing & demonstrates good technique Exercise tolerated well No report of cardiac concerns or symptoms Strength training completed today  Goals Unmet:  Not Applicable  Comments: Pt able to follow exercise prescription today without complaint.  Will continue to monitor for progression.    Dr. Emily Filbert is Medical Director for [place] and [facility].

## 2019-02-27 ENCOUNTER — Encounter: Payer: Medicare Other | Admitting: *Deleted

## 2019-02-27 DIAGNOSIS — R0609 Other forms of dyspnea: Secondary | ICD-10-CM | POA: Diagnosis not present

## 2019-02-27 NOTE — Progress Notes (Signed)
Daily Session Note  Patient Details  Name: Elizabeth [name] MRN: [id number] Date of Birth: 10-Oct-1927 Referring Provider:     Pulmonary Rehab from 01/06/2019 in [facility] Cardiac and Pulmonary Rehab  Referring Provider  Ida Rogue MD      Encounter Date: 02/27/2019  Check In: Session Check In - 02/27/19 1138      Check-In   Supervising physician immediately available to respond to emergencies  [facility] immediately available ER MD    Physician(s)  Dr. Cherylann Banas and Corky Downs    Location  [name]    Staff Present  Renita Papa, RN BSN;Jessica Luan Pulling, MA, RCEP, CCRP, Exercise Physiologist;Joseph Tessie Fass RCP,RRT,BSRT    Medication changes reported      No    Fall or balance concerns reported     No    Tobacco Cessation  No Change    Warm-up and Cool-down  Performed as group-led instruction    Resistance Training Performed  Yes    VAD Patient?  No    PAD/SET Patient?  No      Pain Assessment   Currently in Pain?  No/denies          Social History   Tobacco Use  Smoking Status Never Smoker  Smokeless Tobacco Never Used    Goals Met:  Proper associated with RPD/PD & O2 Sat Independence with exercise equipment Using PLB without cueing & demonstrates good technique Exercise tolerated well No report of cardiac concerns or symptoms Strength training completed today  Goals Unmet:  Not Applicable  Comments: Pt able to follow exercise prescription today without complaint.  Will continue to monitor for progression.    Dr. Emily Filbert is Medical Director for [place] and [facility].

## 2019-03-02 DIAGNOSIS — R0609 Other forms of dyspnea: Principal | ICD-10-CM

## 2019-03-02 NOTE — Progress Notes (Signed)
Pulmonary Individual Treatment Plan  Patient Details  Name: JUDIETH MCKOWN MRN: [id number] Date of Birth: 1927-01-27 Referring Provider:     Pulmonary Rehab from 01/06/2019 in [facility] Cardiac and Pulmonary Rehab  Referring Provider  Ida Rogue MD      Initial Encounter Date:    Pulmonary Rehab from 01/06/2019 in [facility] Cardiac and Pulmonary Rehab  Date  01/06/19      Visit Diagnosis: Dyspnea on exertion  Patient's Home Medications on Admission:  Current Outpatient Medications:  .  ACCUPRIL 40 MG tablet, Take 1 tablet (40 mg total) by mouth daily., Disp: 90 tablet, Rfl: 1 .  aspirin 81 MG tablet, Take 81 mg by mouth daily., Disp: , Rfl:  .  cetirizine (ZYRTEC) 10 MG tablet, Take 10 mg by mouth daily as needed. , Disp: , Rfl:  .  Cholecalciferol (VITAMIN D-3) 1000 UNITS CAPS, Take by mouth., Disp: , Rfl:  .  estradiol (ESTRACE) 0.1 MG/GM vaginal cream, PLACE 1 APPLICATORFUL VAGINALLY 2 TIMES A WEEK, Disp: 42.5 g, Rfl: 2 .  hydrALAZINE (APRESOLINE) 25 MG tablet, Take 1 tablet (25 mg total) by mouth 3 (three) times daily as needed., Disp: 90 tablet, Rfl: 6 .  LIPITOR 40 MG tablet, TAKE 1 TABLET BY MOUTH ONCE DAILY, Disp: 30 tablet, Rfl: 5 .  magnesium oxide (MAG-OX) 400 (241.3 Mg) MG tablet, TAKE 1 TABLET BY MOUTH ONCE DAILY, Disp: 30 tablet, Rfl: 6 .  metoprolol tartrate (LOPRESSOR) 25 MG tablet, Take 1 tablet (25 mg total) by mouth 2 (two) times daily., Disp: 180 tablet, Rfl: 2 .  triamcinolone (NASACORT) 55 MCG/ACT nasal inhaler, Place 2 sprays into the nose daily as needed. , Disp: , Rfl:   Past Medical History: Past Medical History:  Diagnosis Date  . Cystocele or rectocele with incomplete uterine prolapse   . Hyperlipidemia   . Hypertension   . Irritable bowel syndrome   . Palpitations   . Vaginal atrophy     Tobacco Use: Social History   Tobacco Use  Smoking Status Never Smoker  Smokeless Tobacco Never Used    Labs: Recent Review Flowsheet Data    Labs for ITP  Cardiac and Pulmonary Rehab Latest Ref Rng & Units 05/27/2017 10/08/2017 02/10/2018 06/17/2018 10/15/2018   Cholestrol 0 - 200 mg/dL 195 205(H) 154 185 170   LDLCALC 0 - 99 mg/dL 119(H) 138(H) 80 111(H) 92   LDLDIRECT mg/dL - - - - -   HDL >39.00 mg/dL 55.90 45.00 54.50 57.10 57.00   Trlycerides 0.0 - 149.0 mg/dL 103.0 108.0 95.0 85.0 103.0       Pulmonary Assessment Scores: Pulmonary Assessment Scores    Row Name 01/06/19 1455         ADL UCSD   ADL Phase  Entry     SOB Score total  9     Rest  0     Walk  0     Stairs  1     Bath  0     Dress  0     Shop  1       CAT Score   CAT Score  4       mMRC Score   mMRC Score  1        Pulmonary Function Assessment: Pulmonary Function Assessment - 01/06/19 1514      Breath   Bilateral Breath Sounds  Clear    Shortness of Breath  No;Limiting activity   she gets short of breath sometimes.  Exercise Target Goals: Exercise Program Goal: Individual exercise prescription set using results from initial 6 min walk test and THRR while considering  patient's activity barriers and safety.   Exercise Prescription Goal: Initial exercise prescription builds to 30-45 minutes a day of aerobic activity, 2-3 days per week.  Home exercise guidelines will be given to patient during program as part of exercise prescription that the participant will acknowledge.  Activity Barriers & Risk Stratification: Activity Barriers & Cardiac Risk Stratification - 01/06/19 1537      Activity Barriers & Cardiac Risk Stratification   Activity Barriers  Balance Concerns;Shortness of Breath;Deconditioning;Muscular Weakness       6 Minute Walk: 6 Minute Walk    Row Name 01/06/19 1535         6 Minute Walk   Phase  Initial     Distance  1384 feet     Walk Time  6 minutes     # of Rest Breaks  0     MPH  2.62     METS  1.96     RPE  11     Perceived Dyspnea   1     VO2 Peak  6.88     Symptoms  No     Resting HR  67 bpm     Resting BP   134/62     Resting Oxygen Saturation   97 %     Exercise Oxygen Saturation  during 6 min walk  94 %     Max Ex. HR  92 bpm     Max Ex. BP  138/66     2 Minute Post BP  132/62       Interval HR   1 Minute HR  80     2 Minute HR  87     3 Minute HR  90     4 Minute HR  89     5 Minute HR  86     6 Minute HR  92     2 Minute Post HR  71     Interval Heart Rate?  Yes       Interval Oxygen   Interval Oxygen?  Yes     Baseline Oxygen Saturation %  97 %     1 Minute Oxygen Saturation %  97 %     1 Minute Liters of Oxygen  0 L Room Air     2 Minute Oxygen Saturation %  95 %     2 Minute Liters of Oxygen  0 L     3 Minute Oxygen Saturation %  94 %     3 Minute Liters of Oxygen  0 L     4 Minute Oxygen Saturation %  97 %     4 Minute Liters of Oxygen  0 L     5 Minute Oxygen Saturation %  96 %     5 Minute Liters of Oxygen  0 L     6 Minute Oxygen Saturation %  98 %     6 Minute Liters of Oxygen  0 L     2 Minute Post Oxygen Saturation %  98 %     2 Minute Post Liters of Oxygen  0 L       Oxygen Initial Assessment: Oxygen Initial Assessment - 01/06/19 1513      Home Oxygen   Home Oxygen Device  None    Sleep Oxygen Prescription  None  Home Exercise Oxygen Prescription  None    Home at Rest Exercise Oxygen Prescription  None      Initial 6 min Walk   Oxygen Used  None      Program Oxygen Prescription   Program Oxygen Prescription  None      Intervention   Short Term Goals  To learn and understand importance of maintaining oxygen saturations>88%;To learn and demonstrate proper pursed lip breathing techniques or other breathing techniques.;To learn and understand importance of monitoring SPO2 with pulse oximeter and demonstrate accurate use of the pulse oximeter.    Long  Term Goals  Maintenance of O2 saturations>88%;Exhibits proper breathing techniques, such as pursed lip breathing or other method taught during program session;Verbalizes importance of monitoring SPO2 with  pulse oximeter and return demonstration       Oxygen Re-Evaluation: Oxygen Re-Evaluation    Row Name 01/14/19 1211 02/20/19 1240           Program Oxygen Prescription   Program Oxygen Prescription  None  None        Home Oxygen   Home Oxygen Device  None  None      Sleep Oxygen Prescription  None  None      Home Exercise Oxygen Prescription  None  None      Home at Rest Exercise Oxygen Prescription  None  None        Goals/Expected Outcomes   Short Term Goals  To learn and understand importance of maintaining oxygen saturations>88%;To learn and demonstrate proper pursed lip breathing techniques or other breathing techniques.;To learn and understand importance of monitoring SPO2 with pulse oximeter and demonstrate accurate use of the pulse oximeter.  To learn and understand importance of maintaining oxygen saturations>88%;To learn and demonstrate proper pursed lip breathing techniques or other breathing techniques.;To learn and understand importance of monitoring SPO2 with pulse oximeter and demonstrate accurate use of the pulse oximeter.      Long  Term Goals  Maintenance of O2 saturations>88%;Exhibits proper breathing techniques, such as pursed lip breathing or other method taught during program session;Verbalizes importance of monitoring SPO2 with pulse oximeter and return demonstration  Maintenance of O2 saturations>88%;Exhibits proper breathing techniques, such as pursed lip breathing or other method taught during program session;Verbalizes importance of monitoring SPO2 with pulse oximeter and return demonstration      Comments  Reviewed PLB technique with pt.  Talked about how it work and it's important to maintaining his exercise saturations.    Karrisa has been doing well with her PLB, when she practices. She forgets about it at home. She has noticed an improvement in her SOB since starting the program      Goals/Expected Outcomes  Short: Become more profiecient at using PLB.   Long:  Become independent at using PLB.  Short: Become independent at home with PLB. Long: maintain independence with SOB management.          Oxygen Discharge (Final Oxygen Re-Evaluation): Oxygen Re-Evaluation - 02/20/19 1240      Program Oxygen Prescription   Program Oxygen Prescription  None      Home Oxygen   Home Oxygen Device  None    Sleep Oxygen Prescription  None    Home Exercise Oxygen Prescription  None    Home at Rest Exercise Oxygen Prescription  None      Goals/Expected Outcomes   Short Term Goals  To learn and understand importance of maintaining oxygen saturations>88%;To learn and demonstrate proper pursed lip breathing techniques  or other breathing techniques.;To learn and understand importance of monitoring SPO2 with pulse oximeter and demonstrate accurate use of the pulse oximeter.    Long  Term Goals  Maintenance of O2 saturations>88%;Exhibits proper breathing techniques, such as pursed lip breathing or other method taught during program session;Verbalizes importance of monitoring SPO2 with pulse oximeter and return demonstration    Comments  Sinclaire has been doing well with her PLB, when she practices. She forgets about it at home. She has noticed an improvement in her SOB since starting the program    Goals/Expected Outcomes  Short: Become independent at home with PLB. Long: maintain independence with SOB management.        Initial Exercise Prescription: Initial Exercise Prescription - 01/06/19 1500      Date of Initial Exercise RX and Referring Provider   Date  01/06/19    Referring Provider  Ida Rogue MD      Treadmill   MPH  2    Grade  0.5    Minutes  15    METs  2.67      NuStep   Level  1    SPM  80    Minutes  15    METs  2      REL-XR   Level  1    Speed  50    Minutes  15    METs  2      Prescription Details   Frequency (times per week)  3    Duration  Progress to 45 minutes of aerobic exercise without signs/symptoms of physical  distress      Intensity   THRR 40-80% of Max Heartrate  93-118    Ratings of Perceived Exertion  11-13    Perceived Dyspnea  0-4      Progression   Progression  Continue to progress workloads to maintain intensity without signs/symptoms of physical distress.      Resistance Training   Training Prescription  Yes    Weight  3 lbs    Reps  10-15       Perform Capillary Blood Glucose checks as needed.  Exercise Prescription Changes: Exercise Prescription Changes    Row Name 01/06/19 1500 01/20/19 1400 01/21/19 1200 02/03/19 1300 02/17/19 1500     Response to Exercise   Blood Pressure (Admit)  134/62  126/68  -  116/62  110/70   Blood Pressure (Exercise)  138/66  142/70  -  -  -   Blood Pressure (Exit)  132/62  112/68  -  120/70  120/70   Heart Rate (Admit)  67 bpm  71 bpm  -  75 bpm  75 bpm   Heart Rate (Exercise)  92 bpm  91 bpm  -  91 bpm  77 bpm   Heart Rate (Exit)  71 bpm  70 bpm  -  72 bpm  77 bpm   Oxygen Saturation (Admit)  97 %  97 %  -  97 %  98 %   Oxygen Saturation (Exercise)  94 %  94 %  -  90 %  93 %   Oxygen Saturation (Exit)  98 %  95 %  -  96 %  95 %   Rating of Perceived Exertion (Exercise)  11  11  -  12  12   Perceived Dyspnea (Exercise)  1  1  -  3  1   Symptoms  none  none  -  SOB on treadmill  none   Comments  walk test results  -  -  -  -   Duration  -  Continue with 45 min of aerobic exercise without signs/symptoms of physical distress.  -  Continue with 45 min of aerobic exercise without signs/symptoms of physical distress.  Continue with 45 min of aerobic exercise without signs/symptoms of physical distress.   Intensity  -  THRR unchanged  -  THRR unchanged  THRR unchanged     Progression   Progression  -  Continue to progress workloads to maintain intensity without signs/symptoms of physical distress.  -  Continue to progress workloads to maintain intensity without signs/symptoms of physical distress.  Continue to progress workloads to maintain  intensity without signs/symptoms of physical distress.   Average METs  -  2.93  -  2.96  2.97     Resistance Training   Training Prescription  -  Yes  -  Yes  Yes   Weight  -  3 lbs  -  3 lbs  3 lbs   Reps  -  10-15  -  10-15  10-15     Interval Training   Interval Training  -  No  -  No  No     Treadmill   MPH  -  2.5  -  2.5  2.8   Grade  -  0.5  -  0.5  1.5   Minutes  -  15  -  15  15   METs  -  3.09  -  3.09  3.72     NuStep   Level  -  1  -  1  3   Minutes  -  15  -  15  15   METs  -  2.7  -  2.6  2.6     REL-XR   Level  -  1  -  1  3   Minutes  -  15  -  15  15   METs  -  3  -  3.2  2.6     Home Exercise Plan   Plans to continue exercise at  -  -  Home (comment) walking  Home (comment) walking  Home (comment) walking   Frequency  -  -  Add 1 additional day to program exercise sessions.  Add 1 additional day to program exercise sessions.  Add 2 additional days to program exercise sessions.   Initial Home Exercises Provided  -  -  01/21/19  01/21/19  01/21/19      Exercise Comments: Exercise Comments    Row Name 01/14/19 1210           Exercise Comments  First full day of exercise!  Patient was oriented to gym and equipment including functions, settings, policies, and procedures.  Patient's individual exercise prescription and treatment plan were reviewed.  All starting workloads were established based on the results of the 6 minute walk test done at initial orientation visit.  The plan for exercise progression was also introduced and progression will be customized based on patient's performance and goals.          Exercise Goals and Review: Exercise Goals    Row Name 01/06/19 1540             Exercise Goals   Increase Physical Activity  Yes       Intervention  Provide advice, education, support and counseling about physical activity/exercise needs.;Develop an individualized  exercise prescription for aerobic and resistive training based on initial evaluation  findings, risk stratification, comorbidities and participant's personal goals.       Expected Outcomes  Short Term: Attend rehab on a regular basis to increase amount of physical activity.;Long Term: Add in home exercise to make exercise part of routine and to increase amount of physical activity.;Long Term: Exercising regularly at least 3-5 days a week.       Increase Strength and Stamina  Yes       Intervention  Provide advice, education, support and counseling about physical activity/exercise needs.;Develop an individualized exercise prescription for aerobic and resistive training based on initial evaluation findings, risk stratification, comorbidities and participant's personal goals.       Expected Outcomes  Short Term: Increase workloads from initial exercise prescription for resistance, speed, and METs.;Long Term: Improve cardiorespiratory fitness, muscular endurance and strength as measured by increased METs and functional capacity (6MWT);Short Term: Perform resistance training exercises routinely during rehab and add in resistance training at home       Able to understand and use rate of perceived exertion (RPE) scale  Yes       Intervention  Provide education and explanation on how to use RPE scale       Expected Outcomes  Short Term: Able to use RPE daily in rehab to express subjective intensity level;Long Term:  Able to use RPE to guide intensity level when exercising independently       Able to understand and use Dyspnea scale  Yes       Intervention  Provide education and explanation on how to use Dyspnea scale       Expected Outcomes  Short Term: Able to use Dyspnea scale daily in rehab to express subjective sense of shortness of breath during exertion;Long Term: Able to use Dyspnea scale to guide intensity level when exercising independently       Knowledge and understanding of Target Heart Rate Range (THRR)  Yes       Intervention  Provide education and explanation of THRR including how  the numbers were predicted and where they are located for reference       Expected Outcomes  Short Term: Able to state/look up THRR;Short Term: Able to use daily as guideline for intensity in rehab;Long Term: Able to use THRR to govern intensity when exercising independently       Able to check pulse independently  Yes       Intervention  Provide education and demonstration on how to check pulse in carotid and radial arteries.;Review the importance of being able to check your own pulse for safety during independent exercise       Expected Outcomes  Short Term: Able to explain why pulse checking is important during independent exercise;Long Term: Able to check pulse independently and accurately       Understanding of Exercise Prescription  Yes       Intervention  Provide education, explanation, and written materials on patient's individual exercise prescription       Expected Outcomes  Short Term: Able to explain program exercise prescription;Long Term: Able to explain home exercise prescription to exercise independently          Exercise Goals Re-Evaluation : Exercise Goals Re-Evaluation    Row Name 01/14/19 1210 01/20/19 1410 01/21/19 1225 02/03/19 1311 02/17/19 1505     Exercise Goal Re-Evaluation   Exercise Goals Review  Able to understand and use rate of perceived exertion (RPE) scale;Able to  understand and use Dyspnea scale;Understanding of Exercise Prescription;Knowledge and understanding of Target Heart Rate Range (THRR)  Increase Physical Activity;Increase Strength and Stamina;Understanding of Exercise Prescription  Increase Physical Activity;Increase Strength and Stamina;Able to understand and use rate of perceived exertion (RPE) scale;Able to understand and use Dyspnea scale;Knowledge and understanding of Target Heart Rate Range (THRR);Able to check pulse independently;Understanding of Exercise Prescription  Increase Physical Activity;Increase Strength and Stamina;Understanding of Exercise  Prescription  Increase Physical Activity;Increase Strength and Stamina;Understanding of Exercise Prescription   Comments  Reviewed RPE scale, THR and program prescription with pt today.  Pt voiced understanding and was given a copy of goals to take home.   Ren is off to a good start in rehab.  She has been doing well with her workloads and should be able to start to increase workloads and review home exercise guidlines.  We will continue to monitor her progress.   Reviewed home exercise with pt today.  Pt plans to walk at home for exercise.  Reviewed THR, pulse, RPE, sign and symptoms, and when to call 911 or MD.  Also discussed weather considerations and indoor options.  Pt voiced understanding.  Raniah has been doing well in rehab.  The treadmill is her easiest piece as the RPE is only a 9.  We will move this up and continue to try to challenge her.  We will continue to monitor her progress.   Ozie continues to do well in rehab.  She has increased her treadmill to 2.8 mph.  We will continue to monitor her progress.    Expected Outcomes  Short: Use RPE daily to regulate intensity. Long: Follow program prescription in THR.  Short: Increase workloads and review home exercise guidelines.  Long: Continue to rebuild strength and stamina.   Short: Start to increase intensity of walks to make more intentional for exercise at least one day a week.  Long: Continue to build strength and stamina.   Short: Increase treadmill.  Long: Continue to increase strength and stamina.   Short: Continue to increase workloads.  Long: Continue to increase activities.       Discharge Exercise Prescription (Final Exercise Prescription Changes): Exercise Prescription Changes - 02/17/19 1500      Response to Exercise   Blood Pressure (Admit)  110/70    Blood Pressure (Exit)  120/70    Heart Rate (Admit)  75 bpm    Heart Rate (Exercise)  77 bpm    Heart Rate (Exit)  77 bpm    Oxygen Saturation (Admit)  98 %    Oxygen  Saturation (Exercise)  93 %    Oxygen Saturation (Exit)  95 %    Rating of Perceived Exertion (Exercise)  12    Perceived Dyspnea (Exercise)  1    Symptoms  none    Duration  Continue with 45 min of aerobic exercise without signs/symptoms of physical distress.    Intensity  THRR unchanged      Progression   Progression  Continue to progress workloads to maintain intensity without signs/symptoms of physical distress.    Average METs  2.97      Resistance Training   Training Prescription  Yes    Weight  3 lbs    Reps  10-15      Interval Training   Interval Training  No      Treadmill   MPH  2.8    Grade  1.5    Minutes  15    METs  3.72      NuStep   Level  3    Minutes  15    METs  2.6      REL-XR   Level  3    Minutes  15    METs  2.6      Home Exercise Plan   Plans to continue exercise at  Home (comment)   walking   Frequency  Add 2 additional days to program exercise sessions.    Initial Home Exercises Provided  01/21/19       Nutrition:  Target Goals: Understanding of nutrition guidelines, daily intake of sodium '1500mg'$ , cholesterol '200mg'$ , calories 30% from fat and 7% or less from saturated fats, daily to have 5 or more servings of fruits and vegetables.  Biometrics: Pre Biometrics - 01/06/19 1541      Pre Biometrics   Height  5' 1.5" (1.562 m)    Weight  137 lb 8 oz (62.4 kg)    Waist Circumference  41.5 inches    Hip Circumference  39.5 inches    Waist to Hip Ratio  1.05 %    BMI (Calculated)  25.56    Single Leg Stand  4.04 seconds        Nutrition Therapy Plan and Nutrition Goals: Nutrition Therapy & Goals - 01/06/19 1518      Personal Nutrition Goals   Nutrition Goal  Lose some weight.    Comments  She feels that her diet is not as good as it used to be. She is states she tries to eat healthy but not as well as she used to. Her weight gain is new and she wants to lsoe a little weight.      Intervention Plan   Intervention  Prescribe,  educate and counsel regarding individualized specific dietary modifications aiming towards targeted core components such as weight, hypertension, lipid management, diabetes, heart failure and other comorbidities.;Nutrition handout(s) given to patient.    Expected Outcomes  Short Term Goal: Understand basic principles of dietary content, such as calories, fat, sodium, cholesterol and nutrients.;Long Term Goal: Adherence to prescribed nutrition plan.       Nutrition Assessments: Nutrition Assessments - 01/06/19 1459      MEDFICTS Scores   Pre Score  24       Nutrition Goals Re-Evaluation:   Nutrition Goals Discharge (Final Nutrition Goals Re-Evaluation):   Psychosocial: Target Goals: Acknowledge presence or absence of significant depression and/or stress, maximize coping skills, provide positive support system. Participant is able to verbalize types and ability to use techniques and skills needed for reducing stress and depression.   Initial Review & Psychosocial Screening: Initial Psych Review & Screening - 01/06/19 1516      Initial Review   Current issues with  History of Depression;Current Depression;Current Stress Concerns    Source of Stress Concerns  Chronic Illness;Family    Comments  Her husband died recently and is coping with the loss. She is trying to get used to living alone.      Family Dynamics   Good Support System?  Yes    Comments  She can look to her two kids and three grandsons.      Barriers   Psychosocial barriers to participate in program  The patient should benefit from training in stress management and relaxation.      Screening Interventions   Interventions  Encouraged to exercise;To provide support and resources with identified psychosocial needs;Program counselor consult;Provide feedback about the scores to participant  Expected Outcomes  Short Term goal: Utilizing psychosocial counselor, staff and physician to assist with identification of specific  Stressors or current issues interfering with healing process. Setting desired goal for each stressor or current issue identified.;Long Term Goal: Stressors or current issues are controlled or eliminated.;Short Term goal: Identification and review with participant of any Quality of Life or Depression concerns found by scoring the questionnaire.;Long Term goal: The participant improves quality of Life and PHQ9 Scores as seen by post scores and/or verbalization of changes       Quality of Life Scores:  Scores of 19 and below usually indicate a poorer quality of life in these areas.  A difference of  2-3 points is a clinically meaningful difference.  A difference of 2-3 points in the total score of the Quality of Life Index has been associated with significant improvement in overall quality of life, self-image, physical symptoms, and general health in studies assessing change in quality of life.  PHQ-9: Recent Review Flowsheet Data    Depression screen [facility] 2/9 01/06/2019 05/28/2018 05/27/2017 09/28/2016 07/19/2015   Decreased Interest 1 0 0 0 0   Down, Depressed, Hopeless 0 0 0 0 0   PHQ - 2 Score 1 0 0 0 0   Altered sleeping 1 - - - -   Tired, decreased energy 1 - - - -   Change in appetite 0 - - - -   Feeling bad or failure about yourself  0 - - - -   Trouble concentrating 0 - - - -   Moving slowly or fidgety/restless 0 - - - -   Suicidal thoughts 0 - - - -   PHQ-9 Score 3 - - - -   Difficult doing work/chores Not difficult at all - - - -     Interpretation of Total Score  Total Score Depression Severity:  1-4 = Minimal depression, 5-9 = Mild depression, 10-14 = Moderate depression, 15-19 = Moderately severe depression, 20-27 = Severe depression   Psychosocial Evaluation and Intervention: Psychosocial Evaluation - 01/28/19 1237      Psychosocial Evaluation & Interventions   Interventions  Stress management education;Encouraged to exercise with the program and follow exercise prescription     Comments  Counselor met with Ms. Raffield Jaclyn Shaggy) today for initial psychosocial evaluation.    She just turned 85 years old two days ago and has been experiencing shortness of breath.  Sayward has a strong support system with her son and his family; a daughter and her family and some friends.  She is not sleeping well and doesn't have much of an appetite since her spouse died 13 months ago.  She denies a history of depression or anxiety and her mood is generally positive.  Her primary stress at this time is learning to live alone and her current health condition.  She has goals to improve her breathing in order to return to the [employer] class.   Staff will follow with her.     Expected Outcomes  Short:  Koleen will exercise consistently for her health and to provide some social interactions while she is missing so many loved ones.  Long:  Sharea will get back into [employer] and continue to exercise for her health and mental health.      Continue Psychosocial Services   Follow up required by staff       Psychosocial Re-Evaluation: Psychosocial Re-Evaluation    [place] Name 02/20/19 [phone]  Psychosocial Re-Evaluation   Current issues with  Current Depression;History of Depression;Current Stress Concerns;Current Sleep Concerns       Comments  Fawna continues to struggle to sleep at night, but has noticed an increase in sleep time since starting the program. She has been so busy in the past with her children, taking care of her husband before he passed, and job. Now she is trying to find a new groove being alone at home. SHe is supposed to see a neurologist next week to check on her dizziness and memory. She loves having her grandson over weekly for dinner and she has gotten back to painting which has been a huge excitement for her. She is also really enjoying coming to class and getting to know her classmates       Expected Outcomes  Short: Eyana will continue to paint this oil painting  that is expected from her and continue coming to [facility] for socialization Long: Quintin Alto will find hobbies to fill her extra time and keep her "mentally sharp"        Continue Psychosocial Services   Follow up required by staff          Psychosocial Discharge (Final Psychosocial Re-Evaluation): Psychosocial Re-Evaluation - 02/20/19 1234      Psychosocial Re-Evaluation   Current issues with  Current Depression;History of Depression;Current Stress Concerns;Current Sleep Concerns    Comments  Alliya continues to struggle to sleep at night, but has noticed an increase in sleep time since starting the program. She has been so busy in the past with her children, taking care of her husband before he passed, and job. Now she is trying to find a new groove being alone at home. SHe is supposed to see a neurologist next week to check on her dizziness and memory. She loves having her grandson over weekly for dinner and she has gotten back to painting which has been a huge excitement for her. She is also really enjoying coming to class and getting to know her classmates    Expected Outcomes  Short: Illa will continue to paint this oil painting that is expected from her and continue coming to [facility] for socialization Long: Quintin Alto will find hobbies to fill her extra time and keep her "mentally sharp"     Continue Psychosocial Services   Follow up required by staff       Education: Education Goals: Education classes will be provided on a weekly basis, covering required topics. Participant will state understanding/return demonstration of topics presented.  Learning Barriers/Preferences: Learning Barriers/Preferences - 01/06/19 1516      Learning Barriers/Preferences   Learning Barriers  Hearing    Learning Preferences  None       Education Topics:  Initial Evaluation Education: - Verbal, written and demonstration of respiratory meds, oximetry and breathing techniques. Instruction on use of nebulizers and  MDIs and importance of monitoring MDI activations.   Pulmonary Rehab from 02/27/2019 in [facility] Cardiac and Pulmonary Rehab  Date  01/06/19  Educator  [facility]  Instruction Review Code  1- Verbalizes Understanding      General Nutrition Guidelines/Fats and Fiber: -Group instruction provided by verbal, written material, models and posters to present the general guidelines for heart healthy nutrition. Gives an explanation and review of dietary fats and fiber.   Pulmonary Rehab from 02/27/2019 in [facility] Cardiac and Pulmonary Rehab  Date  02/04/19  Educator  [facility]  Instruction Review Code  1- Verbalizes Understanding      Controlling  Sodium/Reading Food Labels: -Group verbal and written material supporting the discussion of sodium use in heart healthy nutrition. Review and explanation with models, verbal and written materials for utilization of the food label.   Exercise Physiology & General Exercise Guidelines: - Group verbal and written instruction with models to review the exercise physiology of the cardiovascular system and associated critical values. Provides general exercise guidelines with specific guidelines to those with heart or lung disease.    Pulmonary Rehab from 02/27/2019 in [facility] Cardiac and Pulmonary Rehab  Date  01/14/19  Educator  [facility]  Instruction Review Code  1- Verbalizes Understanding      Aerobic Exercise & Resistance Training: - Gives group verbal and written instruction on the various components of exercise. Focuses on aerobic and resistive training programs and the benefits of this training and how to safely progress through these programs.   Pulmonary Rehab from 02/27/2019 in [facility] Cardiac and Pulmonary Rehab  Date  01/16/19  Educator  [facility]  Instruction Review Code  1- Verbalizes Understanding      Flexibility, Balance, Mind/Body Relaxation: Provides group verbal/written instruction on the benefits of flexibility and balance training, including mind/body exercise modes such as  yoga, pilates and tai chi.  Demonstration and skill practice provided.   Pulmonary Rehab from 02/27/2019 in [facility] Cardiac and Pulmonary Rehab  Date  01/21/19  Educator  [facility]  Instruction Review Code  1- Verbalizes Understanding      Stress and Anxiety: - Provides group verbal and written instruction about the health risks of elevated stress and causes of high stress.  Discuss the correlation between heart/lung disease and anxiety and treatment options. Review healthy ways to manage with stress and anxiety.   Pulmonary Rehab from 02/27/2019 in [facility] Cardiac and Pulmonary Rehab  Date  01/28/19  Educator  [facility]  Instruction Review Code  1- Verbalizes Understanding      Depression: - Provides group verbal and written instruction on the correlation between heart/lung disease and depressed mood, treatment options, and the stigmas associated with seeking treatment.   Pulmonary Rehab from 02/27/2019 in [facility] Cardiac and Pulmonary Rehab  Date  02/25/19  Educator  [place]  Instruction Review Code  1- Verbalizes Understanding      Exercise & Equipment Safety: - Individual verbal instruction and demonstration of equipment use and safety with use of the equipment.   Pulmonary Rehab from 02/27/2019 in [facility] Cardiac and Pulmonary Rehab  Date  01/06/19  Educator  [facility]  Instruction Review Code  1- Verbalizes Understanding      Infection Prevention: - Provides verbal and written material to individual with discussion of infection control including proper hand washing and proper equipment cleaning during exercise session.   Pulmonary Rehab from 02/27/2019 in [facility] Cardiac and Pulmonary Rehab  Date  01/06/19  Educator  [facility]  Instruction Review Code  1- Verbalizes Understanding      Falls Prevention: - Provides verbal and written material to individual with discussion of falls prevention and safety.   Pulmonary Rehab from 02/27/2019 in [facility] Cardiac and Pulmonary Rehab  Date  01/06/19  Educator  [facility]  Instruction Review  Code  1- Verbalizes Understanding      Diabetes: - Individual verbal and written instruction to review signs/symptoms of diabetes, desired ranges of glucose level fasting, after meals and with exercise. Advice that pre and post exercise glucose checks will be done for 3 sessions at entry of program.   Chronic Lung Diseases: - Group verbal and written instruction to  review updates, respiratory medications, advancements in procedures and treatments. Discuss use of supplemental oxygen including available portable oxygen systems, continuous and intermittent flow rates, concentrators, personal use and safety guidelines. Review proper use of inhaler and spacers. Provide informative websites for self-education.    Pulmonary Rehab from 02/27/2019 in [facility] Cardiac and Pulmonary Rehab  Date  01/30/19  Educator  [facility]  Instruction Review Code  1- Verbalizes Understanding      Energy Conservation: - Provide group verbal and written instruction for methods to conserve energy, plan and organize activities. Instruct on pacing techniques, use of adaptive equipment and posture/positioning to relieve shortness of breath.   Pulmonary Rehab from 02/27/2019 in [facility] Cardiac and Pulmonary Rehab  Date  02/27/19  Educator  [facility]  Instruction Review Code  1- Verbalizes Understanding      Triggers and Exacerbations: - Group verbal and written instruction to review types of environmental triggers and ways to prevent exacerbations. Discuss weather changes, air quality and the benefits of nasal washing. Review warning signs and symptoms to help prevent infections. Discuss techniques for effective airway clearance, coughing, and vibrations.   AED/CPR: - Group verbal and written instruction with the use of models to demonstrate the basic use of the AED with the basic ABC's of resuscitation.   Anatomy and Physiology of the Lungs: - Group verbal and written instruction with the use of models to provide basic lung anatomy and  physiology related to function, structure and complications of lung disease.   Anatomy & Physiology of the Heart: - Group verbal and written instruction and models provide basic cardiac anatomy and physiology, with the coronary electrical and arterial systems. Review of Valvular disease and Heart Failure   Cardiac Medications: - Group verbal and written instruction to review commonly prescribed medications for heart disease. Reviews the medication, class of the drug, and side effects.   Know Your Numbers and Risk Factors: -Group verbal and written instruction about important numbers in your health.  Discussion of what are risk factors and how they play a role in the disease process.  Review of Cholesterol, Blood Pressure, Diabetes, and BMI and the role they play in your overall health.   Pulmonary Rehab from 02/27/2019 in [facility] Cardiac and Pulmonary Rehab  Date  02/18/19  Educator  [facility]  Instruction Review Code  1- Verbalizes Understanding      Sleep Hygiene: -Provides group verbal and written instruction about how sleep can affect your health.  Define sleep hygiene, discuss sleep cycles and impact of sleep habits. Review good sleep hygiene tips.    Other: -Provides group and verbal instruction on various topics (see comments)    Knowledge Questionnaire Score: Knowledge Questionnaire Score - 01/06/19 1500      Knowledge Questionnaire Score   Pre Score  15/18   reviewed with patient       Core Components/Risk Factors/Patient Goals at Admission: Personal Goals and Risk Factors at Admission - 01/06/19 1509      Core Components/Risk Factors/Patient Goals on Admission    Weight Management  Yes;Weight Maintenance    Intervention  Weight Management: Provide education and appropriate resources to help participant work on and attain dietary goals.;Weight Management: Develop a combined nutrition and exercise program designed to reach desired caloric intake, while maintaining appropriate  intake of nutrient and fiber, sodium and fats, and appropriate energy expenditure required for the weight goal.;Weight Management/Obesity: Establish reasonable short term and long term weight goals.    Admit Weight  137 lb (62.1 kg)  Goal Weight: Short Term  132 lb (59.9 kg)    Goal Weight: Long Term  130 lb (59 kg)    Expected Outcomes  Short Term: Continue to assess and modify interventions until short term weight is achieved;Long Term: Adherence to nutrition and physical activity/exercise program aimed toward attainment of established weight goal;Weight Maintenance: Understanding of the daily nutrition guidelines, which includes 25-35% calories from fat, 7% or less cal from saturated fats, less than '200mg'$  cholesterol, less than 1.5gm of sodium, & 5 or more servings of fruits and vegetables daily;Understanding recommendations for meals to include 15-35% energy as protein, 25-35% energy from fat, 35-60% energy from carbohydrates, less than '200mg'$  of dietary cholesterol, 20-35 gm of total fiber daily;Understanding of distribution of calorie intake throughout the day with the consumption of 4-5 meals/snacks    Improve shortness of breath with ADL's  Yes    Intervention  Provide education, individualized exercise plan and daily activity instruction to help decrease symptoms of SOB with activities of daily living.    Expected Outcomes  Short Term: Improve cardiorespiratory fitness to achieve a reduction of symptoms when performing ADLs;Long Term: Be able to perform more ADLs without symptoms or delay the onset of symptoms    Hypertension  Yes    Intervention  Provide education on lifestyle modifcations including regular physical activity/exercise, weight management, moderate sodium restriction and increased consumption of fresh fruit, vegetables, and low fat dairy, alcohol moderation, and smoking cessation.;Monitor prescription use compliance.    Expected Outcomes  Short Term: Continued assessment and  intervention until BP is < 140/7m HG in hypertensive participants. < 130/828mHG in hypertensive participants with diabetes, heart failure or chronic kidney disease.;Long Term: Maintenance of blood pressure at goal levels.    Lipids  Yes    Intervention  Provide education and support for participant on nutrition & aerobic/resistive exercise along with prescribed medications to achieve LDL '70mg'$ , HDL >'40mg'$ .    Expected Outcomes  Short Term: Participant states understanding of desired cholesterol values and is compliant with medications prescribed. Participant is following exercise prescription and nutrition guidelines.;Long Term: Cholesterol controlled with medications as prescribed, with individualized exercise RX and with personalized nutrition plan. Value goals: LDL < '70mg'$ , HDL > 40 mg.       Core Components/Risk Factors/Patient Goals Review:  Goals and Risk Factor Review    Row Name 02/20/19 1231             Core Components/Risk Factors/Patient Goals Review   Personal Goals Review  Weight Management/Obesity;Improve shortness of breath with ADL's;Lipids;Hypertension       Review  CoJulanneas enjoyed attending LW and can tell a slight difference already in her SOB. She reports not really getting SOB during activites, but more so at rest some days. She has lived an active lifestyle her whole life so she enjoys the challenge of physical activity. Her blood pressure and weight have been consistent        Expected Outcomes  Short: CoPriscilleill continue to attend LW regularly to improve SOB and increase strength to help with ADLs Long: CoDalylahill maintain independence with ADLs by gaining strength.           Core Components/Risk Factors/Patient Goals at Discharge (Final Review):  Goals and Risk Factor Review - 02/20/19 1231      Core Components/Risk Factors/Patient Goals Review   Personal Goals Review  Weight Management/Obesity;Improve shortness of breath with ADL's;Lipids;Hypertension     Review  CoShantanaas enjoyed attending LW and  can tell a slight difference already in her SOB. She reports not really getting SOB during activites, but more so at rest some days. She has lived an active lifestyle her whole life so she enjoys the challenge of physical activity. Her blood pressure and weight have been consistent     Expected Outcomes  Short: Jahnia will continue to attend LW regularly to improve SOB and increase strength to help with ADLs Long: Jamella will maintain independence with ADLs by gaining strength.        ITP Comments: ITP Comments    Row Name 01/06/19 1431 02/02/19 0845 03/02/19 0826       ITP Comments  Medical Evaluation completed. Chart sent for review and changes to Dr. Emily Filbert Director of [place]. Diagnosis can be found in [facility] encounter 11/13/2018  30 day review completed. ITP sent to Dr. Emily Filbert Director of [place]. Continue with ITP unless changes are made by physician.  30 day review completed. ITP sent to Dr. Emily Filbert Director of [place]. Continue with ITP unless changes are made by physician.        Comments: 30 day review

## 2019-03-04 ENCOUNTER — Other Ambulatory Visit: Payer: Self-pay

## 2019-03-04 ENCOUNTER — Encounter: Payer: Medicare Other | Admitting: *Deleted

## 2019-03-04 DIAGNOSIS — R0609 Other forms of dyspnea: Secondary | ICD-10-CM | POA: Diagnosis not present

## 2019-03-04 NOTE — Progress Notes (Signed)
Daily Session Note  Patient Details  Name: Elizabeth [name] MRN: [id number] Date of Birth: 1927-10-26 Referring Provider:     Pulmonary Rehab from 01/06/2019 in [facility] Cardiac and Pulmonary Rehab  Referring Provider  Ida Rogue MD      Encounter Date: 03/04/2019  Check In: Session Check In - 03/04/19 1124      Check-In   Supervising physician immediately available to respond to emergencies  [facility] immediately available ER MD    Physician(s)  Drs. Glena [place]    Location  [name]    Staff Present  Renita Papa, RN BSN;Dejanay Wamboldt Luan [place], [place], RCEP, CCRP, Exercise Physiologist;Joseph Tessie Fass RCP,RRT,BSRT    Medication changes reported      No    Fall or balance concerns reported     No    Warm-up and Cool-down  Performed as group-led instruction    Resistance Training Performed  Yes    VAD Patient?  No    PAD/SET Patient?  No      Pain Assessment   Currently in Pain?  No/denies          Social History   Tobacco Use  Smoking Status Never Smoker  Smokeless Tobacco Never Used    Goals Met:  Proper associated with RPD/PD & O2 Sat Independence with exercise equipment Using PLB without cueing & demonstrates good technique Exercise tolerated well No report of cardiac concerns or symptoms Strength training completed today  Goals Unmet:  Not Applicable  Comments: Pt able to follow exercise prescription today without complaint.  Will continue to monitor for progression.    Dr. Emily Filbert is Medical Director for [place] and [facility].

## 2019-03-12 ENCOUNTER — Other Ambulatory Visit: Payer: Medicare Other

## 2019-03-16 DIAGNOSIS — R0609 Other forms of dyspnea: Principal | ICD-10-CM

## 2019-03-30 ENCOUNTER — Encounter: Payer: Self-pay | Admitting: *Deleted

## 2019-03-30 DIAGNOSIS — R0609 Other forms of dyspnea: Principal | ICD-10-CM

## 2019-03-30 NOTE — Progress Notes (Signed)
Pulmonary Individual Treatment Plan  Patient Details  [name]: Elizabeth [name] MRN: [id number] Date of Birth: 1927-01-27 Referring Provider:     Pulmonary Rehab from 01/06/2019 in [facility] Cardiac and Pulmonary Rehab  Referring Provider  Elizabeth Rogue MD      Initial Encounter Date:    Pulmonary Rehab from 01/06/2019 in [facility] Cardiac and Pulmonary Rehab  Date  01/06/19      Visit Diagnosis: Dyspnea on exertion  Patient's Home Medications on Admission:  Current Outpatient Medications:  .  ACCUPRIL 40 MG tablet, Take 1 tablet (40 mg total) by mouth daily., Disp: 90 tablet, Rfl: 1 .  aspirin 81 MG tablet, Take 81 mg by mouth daily., Disp: , Rfl:  .  cetirizine (ZYRTEC) 10 MG tablet, Take 10 mg by mouth daily as needed. , Disp: , Rfl:  .  Cholecalciferol (VITAMIN D-3) 1000 UNITS CAPS, Take by mouth., Disp: , Rfl:  .  estradiol (ESTRACE) 0.1 MG/GM vaginal cream, PLACE 1 APPLICATORFUL VAGINALLY 2 TIMES A WEEK, Disp: 42.5 g, Rfl: 2 .  hydrALAZINE (APRESOLINE) 25 MG tablet, Take 1 tablet (25 mg total) by mouth 3 (three) times daily as needed., Disp: 90 tablet, Rfl: 6 .  LIPITOR 40 MG tablet, TAKE 1 TABLET BY MOUTH ONCE DAILY, Disp: 30 tablet, Rfl: 5 .  magnesium oxide (MAG-OX) 400 (241.3 Mg) MG tablet, TAKE 1 TABLET BY MOUTH ONCE DAILY, Disp: 30 tablet, Rfl: 6 .  metoprolol tartrate (LOPRESSOR) 25 MG tablet, Take 1 tablet (25 mg total) by mouth 2 (two) times daily., Disp: 180 tablet, Rfl: 2 .  triamcinolone (NASACORT) 55 MCG/ACT nasal inhaler, Place 2 sprays into the nose daily as needed. , Disp: , Rfl:   Past Medical History: Past Medical History:  Diagnosis Date  . Cystocele or rectocele with incomplete uterine prolapse   . Hyperlipidemia   . Hypertension   . Irritable bowel syndrome   . Palpitations   . Vaginal atrophy     Tobacco Use: Social History   Tobacco Use  Smoking Status Never Smoker  Smokeless Tobacco Never Used    Labs: Recent Review Flowsheet Data    Labs for ITP  Cardiac and Pulmonary Rehab Latest Ref Rng & Units 05/27/2017 10/08/2017 02/10/2018 06/17/2018 10/15/2018   Cholestrol 0 - 200 mg/dL 195 205(H) 154 185 170   LDLCALC 0 - 99 mg/dL 119(H) 138(H) 80 111(H) 92   LDLDIRECT mg/dL - - - - -   HDL >39.00 mg/dL 55.90 45.00 54.50 57.10 57.00   Trlycerides 0.0 - 149.0 mg/dL 103.0 108.0 95.0 85.0 103.0       Pulmonary Assessment Scores: Pulmonary Assessment Scores    Row [name] 01/06/19 1455         ADL UCSD   ADL Phase  Entry     SOB Score total  9     Rest  0     Walk  0     Stairs  1     Bath  0     Dress  0     Shop  1       CAT Score   CAT Score  4       mMRC Score   mMRC Score  1        Pulmonary Function Assessment: Pulmonary Function Assessment - 01/06/19 1514      Breath   Bilateral Breath Sounds  Clear    Shortness of Breath  No;Limiting activity   she gets short of breath sometimes.  Exercise Target Goals: Exercise Program Goal: Individual exercise prescription set using results from initial 6 min walk test and THRR while considering  patient's activity barriers and safety.   Exercise Prescription Goal: Initial exercise prescription builds to 30-45 minutes a day of aerobic activity, 2-3 days per week.  Home exercise guidelines will be given to patient during program as part of exercise prescription that the participant will acknowledge.  Activity Barriers & Risk Stratification: Activity Barriers & Cardiac Risk Stratification - 01/06/19 1537      Activity Barriers & Cardiac Risk Stratification   Activity Barriers  Balance Concerns;Shortness of Breath;Deconditioning;Muscular Weakness       6 Minute Walk: 6 Minute Walk    Row [name] 01/06/19 1535         6 Minute Walk   Phase  Initial     Distance  1384 feet     Walk Time  6 minutes     # of Rest Breaks  0     MPH  2.62     METS  1.96     RPE  11     Perceived Dyspnea   1     VO2 Peak  6.88     Symptoms  No     Resting HR  67 bpm     Resting BP   134/62     Resting Oxygen Saturation   97 %     Exercise Oxygen Saturation  during 6 min walk  94 %     Max Ex. HR  92 bpm     Max Ex. BP  138/66     2 Minute Post BP  132/62       Interval HR   1 Minute HR  80     2 Minute HR  87     3 Minute HR  90     4 Minute HR  89     5 Minute HR  86     6 Minute HR  92     2 Minute Post HR  71     Interval Heart Rate?  Yes       Interval Oxygen   Interval Oxygen?  Yes     Baseline Oxygen Saturation %  97 %     1 Minute Oxygen Saturation %  97 %     1 Minute Liters of Oxygen  0 L Room Air     2 Minute Oxygen Saturation %  95 %     2 Minute Liters of Oxygen  0 L     3 Minute Oxygen Saturation %  94 %     3 Minute Liters of Oxygen  0 L     4 Minute Oxygen Saturation %  97 %     4 Minute Liters of Oxygen  0 L     5 Minute Oxygen Saturation %  96 %     5 Minute Liters of Oxygen  0 L     6 Minute Oxygen Saturation %  98 %     6 Minute Liters of Oxygen  0 L     2 Minute Post Oxygen Saturation %  98 %     2 Minute Post Liters of Oxygen  0 L       Oxygen Initial Assessment: Oxygen Initial Assessment - 01/06/19 1513      Home Oxygen   Home Oxygen Device  None    Sleep Oxygen Prescription  None  Home Exercise Oxygen Prescription  None    Home at Rest Exercise Oxygen Prescription  None      Initial 6 min Walk   Oxygen Used  None      Program Oxygen Prescription   Program Oxygen Prescription  None      Intervention   Short Term Goals  To learn and understand importance of maintaining oxygen saturations>88%;To learn and demonstrate proper pursed lip breathing techniques or other breathing techniques.;To learn and understand importance of monitoring SPO2 with pulse oximeter and demonstrate accurate use of the pulse oximeter.    Long  Term Goals  Maintenance of O2 saturations>88%;Exhibits proper breathing techniques, such as pursed lip breathing or other method taught during program session;Verbalizes importance of monitoring SPO2 with  pulse oximeter and return demonstration       Oxygen Re-Evaluation: Oxygen Re-Evaluation    Row [name] 01/14/19 1211 02/20/19 1240           Program Oxygen Prescription   Program Oxygen Prescription  None  None        Home Oxygen   Home Oxygen Device  None  None      Sleep Oxygen Prescription  None  None      Home Exercise Oxygen Prescription  None  None      Home at Rest Exercise Oxygen Prescription  None  None        Goals/Expected Outcomes   Short Term Goals  To learn and understand importance of maintaining oxygen saturations>88%;To learn and demonstrate proper pursed lip breathing techniques or other breathing techniques.;To learn and understand importance of monitoring SPO2 with pulse oximeter and demonstrate accurate use of the pulse oximeter.  To learn and understand importance of maintaining oxygen saturations>88%;To learn and demonstrate proper pursed lip breathing techniques or other breathing techniques.;To learn and understand importance of monitoring SPO2 with pulse oximeter and demonstrate accurate use of the pulse oximeter.      Long  Term Goals  Maintenance of O2 saturations>88%;Exhibits proper breathing techniques, such as pursed lip breathing or other method taught during program session;Verbalizes importance of monitoring SPO2 with pulse oximeter and return demonstration  Maintenance of O2 saturations>88%;Exhibits proper breathing techniques, such as pursed lip breathing or other method taught during program session;Verbalizes importance of monitoring SPO2 with pulse oximeter and return demonstration      Comments  Reviewed PLB technique with pt.  Talked about how it work and it's important to maintaining his exercise saturations.    Elizabeth [name] has been doing well with her PLB, when she practices. She forgets about it at home. She has noticed an improvement in her SOB since starting the program      Goals/Expected Outcomes  Short: Become more profiecient at using PLB.   Long:  Become independent at using PLB.  Short: Become independent at home with PLB. Long: maintain independence with SOB management.          Oxygen Discharge (Final Oxygen Re-Evaluation): Oxygen Re-Evaluation - 02/20/19 1240      Program Oxygen Prescription   Program Oxygen Prescription  None      Home Oxygen   Home Oxygen Device  None    Sleep Oxygen Prescription  None    Home Exercise Oxygen Prescription  None    Home at Rest Exercise Oxygen Prescription  None      Goals/Expected Outcomes   Short Term Goals  To learn and understand importance of maintaining oxygen saturations>88%;To learn and demonstrate proper pursed lip breathing techniques  or other breathing techniques.;To learn and understand importance of monitoring SPO2 with pulse oximeter and demonstrate accurate use of the pulse oximeter.    Long  Term Goals  Maintenance of O2 saturations>88%;Exhibits proper breathing techniques, such as pursed lip breathing or other method taught during program session;Verbalizes importance of monitoring SPO2 with pulse oximeter and return demonstration    Comments  Elizabeth [name] has been doing well with her PLB, when she practices. She forgets about it at home. She has noticed an improvement in her SOB since starting the program    Goals/Expected Outcomes  Short: Become independent at home with PLB. Long: maintain independence with SOB management.        Initial Exercise Prescription: Initial Exercise Prescription - 01/06/19 1500      Date of Initial Exercise RX and Referring Provider   Date  01/06/19    Referring Provider  Elizabeth Rogue MD      Treadmill   MPH  2    Grade  0.5    Minutes  15    METs  2.67      NuStep   Level  1    SPM  80    Minutes  15    METs  2      REL-XR   Level  1    Speed  50    Minutes  15    METs  2      Prescription Details   Frequency (times per week)  3    Duration  Progress to 45 minutes of aerobic exercise without signs/symptoms of physical  distress      Intensity   THRR 40-80% of Max Heartrate  93-118    Ratings of Perceived Exertion  11-13    Perceived Dyspnea  0-4      Progression   Progression  Continue to progress workloads to maintain intensity without signs/symptoms of physical distress.      Resistance Training   Training Prescription  Yes    Weight  3 lbs    Reps  10-15       Perform Capillary Blood Glucose checks as needed.  Exercise Prescription Changes: Exercise Prescription Changes    Row [name] 01/06/19 1500 01/20/19 1400 01/21/19 1200 02/03/19 1300 02/17/19 1500     Response to Exercise   Blood Pressure (Admit)  134/62  126/68  -  116/62  110/70   Blood Pressure (Exercise)  138/66  142/70  -  -  -   Blood Pressure (Exit)  132/62  112/68  -  120/70  120/70   Heart Rate (Admit)  67 bpm  71 bpm  -  75 bpm  75 bpm   Heart Rate (Exercise)  92 bpm  91 bpm  -  91 bpm  77 bpm   Heart Rate (Exit)  71 bpm  70 bpm  -  72 bpm  77 bpm   Oxygen Saturation (Admit)  97 %  97 %  -  97 %  98 %   Oxygen Saturation (Exercise)  94 %  94 %  -  90 %  93 %   Oxygen Saturation (Exit)  98 %  95 %  -  96 %  95 %   Rating of Perceived Exertion (Exercise)  11  11  -  12  12   Perceived Dyspnea (Exercise)  1  1  -  3  1   Symptoms  none  none  -  SOB on treadmill  none   Comments  walk test results  -  -  -  -   Duration  -  Continue with 45 min of aerobic exercise without signs/symptoms of physical distress.  -  Continue with 45 min of aerobic exercise without signs/symptoms of physical distress.  Continue with 45 min of aerobic exercise without signs/symptoms of physical distress.   Intensity  -  THRR unchanged  -  THRR unchanged  THRR unchanged     Progression   Progression  -  Continue to progress workloads to maintain intensity without signs/symptoms of physical distress.  -  Continue to progress workloads to maintain intensity without signs/symptoms of physical distress.  Continue to progress workloads to maintain  intensity without signs/symptoms of physical distress.   Average METs  -  2.93  -  2.96  2.97     Resistance Training   Training Prescription  -  Yes  -  Yes  Yes   Weight  -  3 lbs  -  3 lbs  3 lbs   Reps  -  10-15  -  10-15  10-15     Interval Training   Interval Training  -  No  -  No  No     Treadmill   MPH  -  2.5  -  2.5  2.8   Grade  -  0.5  -  0.5  1.5   Minutes  -  15  -  15  15   METs  -  3.09  -  3.09  3.72     NuStep   Level  -  1  -  1  3   Minutes  -  15  -  15  15   METs  -  2.7  -  2.6  2.6     REL-XR   Level  -  1  -  1  3   Minutes  -  15  -  15  15   METs  -  3  -  3.2  2.6     Home Exercise Plan   Plans to continue exercise at  -  -  Home (comment) walking  Home (comment) walking  Home (comment) walking   Frequency  -  -  Add 1 additional day to program exercise sessions.  Add 1 additional day to program exercise sessions.  Add 2 additional days to program exercise sessions.   Initial Home Exercises Provided  -  -  01/21/19  01/21/19  01/21/19   Row [name] 03/02/19 1600 03/18/19 1500           Response to Exercise   Blood Pressure (Admit)  130/62  120/72      Blood Pressure (Exit)  130/60  116/68      Heart Rate (Admit)  98 bpm  74 bpm      Heart Rate (Exercise)  90 bpm  87 bpm      Heart Rate (Exit)  73 bpm  71 bpm      Oxygen Saturation (Admit)  97 %  98 %      Oxygen Saturation (Exercise)  96 %  95 %      Oxygen Saturation (Exit)  97 %  98 %      Rating of Perceived Exertion (Exercise)  11  13      Perceived Dyspnea (Exercise)  1  1      Symptoms  none  none  Duration  Continue with 45 min of aerobic exercise without signs/symptoms of physical distress.  Continue with 45 min of aerobic exercise without signs/symptoms of physical distress.      Intensity  THRR unchanged  THRR unchanged        Progression   Progression  Continue to progress workloads to maintain intensity without signs/symptoms of physical distress.  Continue to progress  workloads to maintain intensity without signs/symptoms of physical distress.      Average METs  3.51  3.27        Resistance Training   Training Prescription  Yes  Yes      Weight  3 lbs  3 lbs      Reps  10-15  10-15        Interval Training   Interval Training  No  No        Treadmill   MPH  3  3      Grade  1.5  1.5      Minutes  15  15      METs  3.92  3.92        NuStep   Level  3  3      Minutes  15  15      METs  2.6  2        REL-XR   Level  3  3      Minutes  15  15      METs  4  3.9        Home Exercise Plan   Plans to continue exercise at  Home (comment) walking  Home (comment) walking      Frequency  Add 2 additional days to program exercise sessions.  Add 2 additional days to program exercise sessions.      Initial Home Exercises Provided  01/21/19  01/21/19         Exercise Comments: Exercise Comments    Row [name] 01/14/19 1210           Exercise Comments  First full day of exercise!  Patient was oriented to gym and equipment including functions, settings, policies, and procedures.  Patient's individual exercise prescription and treatment plan were reviewed.  All starting workloads were established based on the results of the 6 minute walk test done at initial orientation visit.  The plan for exercise progression was also introduced and progression will be customized based on patient's performance and goals.          Exercise Goals and Review: Exercise Goals    Row [name] 01/06/19 1540             Exercise Goals   Increase Physical Activity  Yes       Intervention  Provide advice, education, support and counseling about physical activity/exercise needs.;Develop an individualized exercise prescription for aerobic and resistive training based on initial evaluation findings, risk stratification, comorbidities and participant's personal goals.       Expected Outcomes  Short Term: Attend rehab on a regular basis to increase amount of physical  activity.;Long Term: Add in home exercise to make exercise part of routine and to increase amount of physical activity.;Long Term: Exercising regularly at least 3-5 days a week.       Increase Strength and Stamina  Yes       Intervention  Provide advice, education, support and counseling about physical activity/exercise needs.;Develop an individualized exercise prescription for aerobic and resistive training based on initial evaluation findings,  risk stratification, comorbidities and participant's personal goals.       Expected Outcomes  Short Term: Increase workloads from initial exercise prescription for resistance, speed, and METs.;Long Term: Improve cardiorespiratory fitness, muscular endurance and strength as measured by increased METs and functional capacity (6MWT);Short Term: Perform resistance training exercises routinely during rehab and add in resistance training at home       Able to understand and use rate of perceived exertion (RPE) scale  Yes       Intervention  Provide education and explanation on how to use RPE scale       Expected Outcomes  Short Term: Able to use RPE daily in rehab to express subjective intensity level;Long Term:  Able to use RPE to guide intensity level when exercising independently       Able to understand and use Dyspnea scale  Yes       Intervention  Provide education and explanation on how to use Dyspnea scale       Expected Outcomes  Short Term: Able to use Dyspnea scale daily in rehab to express subjective sense of shortness of breath during exertion;Long Term: Able to use Dyspnea scale to guide intensity level when exercising independently       Knowledge and understanding of Target Heart Rate Range (THRR)  Yes       Intervention  Provide education and explanation of THRR including how the numbers were predicted and where they are located for reference       Expected Outcomes  Short Term: Able to state/look up THRR;Short Term: Able to use daily as guideline for  intensity in rehab;Long Term: Able to use THRR to govern intensity when exercising independently       Able to check pulse independently  Yes       Intervention  Provide education and demonstration on how to check pulse in carotid and radial arteries.;Review the importance of being able to check your own pulse for safety during independent exercise       Expected Outcomes  Short Term: Able to explain why pulse checking is important during independent exercise;Long Term: Able to check pulse independently and accurately       Understanding of Exercise Prescription  Yes       Intervention  Provide education, explanation, and written materials on patient's individual exercise prescription       Expected Outcomes  Short Term: Able to explain program exercise prescription;Long Term: Able to explain home exercise prescription to exercise independently          Exercise Goals Re-Evaluation : Exercise Goals Re-Evaluation    Row [name] 01/14/19 1210 01/20/19 1410 01/21/19 1225 02/03/19 1311 02/17/19 1505     Exercise Goal Re-Evaluation   Exercise Goals Review  Able to understand and use rate of perceived exertion (RPE) scale;Able to understand and use Dyspnea scale;Understanding of Exercise Prescription;Knowledge and understanding of Target Heart Rate Range (THRR)  Increase Physical Activity;Increase Strength and Stamina;Understanding of Exercise Prescription  Increase Physical Activity;Increase Strength and Stamina;Able to understand and use rate of perceived exertion (RPE) scale;Able to understand and use Dyspnea scale;Knowledge and understanding of Target Heart Rate Range (THRR);Able to check pulse independently;Understanding of Exercise Prescription  Increase Physical Activity;Increase Strength and Stamina;Understanding of Exercise Prescription  Increase Physical Activity;Increase Strength and Stamina;Understanding of Exercise Prescription   Comments  Reviewed RPE scale, THR and program prescription with pt  today.  Pt voiced understanding and was given a copy of goals to take home.  Elizabeth [name] is off to a good start in rehab.  She has been doing well with her workloads and should be able to start to increase workloads and review home exercise guidlines.  We will continue to monitor her progress.   Reviewed home exercise with pt today.  Pt plans to walk at home for exercise.  Reviewed THR, pulse, RPE, sign and symptoms, and when to call 911 or MD.  Also discussed weather considerations and indoor options.  Pt voiced understanding.  Elizabeth [name] has been doing well in rehab.  The treadmill is her easiest piece as the RPE is only a 9.  We will move this up and continue to try to challenge her.  We will continue to monitor her progress.   Elizabeth [name] continues to do well in rehab.  She has increased her treadmill to 2.8 mph.  We will continue to monitor her progress.    Expected Outcomes  Short: Use RPE daily to regulate intensity. Long: Follow program prescription in THR.  Short: Increase workloads and review home exercise guidelines.  Long: Continue to rebuild strength and stamina.   Short: Start to increase intensity of walks to make more intentional for exercise at least one day a week.  Long: Continue to build strength and stamina.   Short: Increase treadmill.  Long: Continue to increase strength and stamina.   Short: Continue to increase workloads.  Long: Continue to increase activities.    [place] [name] 03/02/19 1629 03/18/19 1520           Exercise Goal Re-Evaluation   Exercise Goals Review  Increase Physical Activity;Increase Strength and Stamina;Understanding of Exercise Prescription  Increase Physical Activity;Increase Strength and Stamina;Understanding of Exercise Prescription      Comments  Elizabeth [name] continues to do well in rehab. She is up to 4 METs on the XR and 3.0 mph on the treadmill.  We will continue to monitor her progress.   Elizabeth [name] has been doing well in rehab.  She continues to make some improvements and the  XR is still her hardest piece.  She should be walking at home and has access to our videos with email.  We will continue to monitor her progress.       Expected Outcomes  Short: Increase Nustep  Long: Continue to move more.   Short: Walk at home.  Long; Continue to maintain her activity levels.          Discharge Exercise Prescription (Final Exercise Prescription Changes): Exercise Prescription Changes - 03/18/19 1500      Response to Exercise   Blood Pressure (Admit)  120/72    Blood Pressure (Exit)  116/68    Heart Rate (Admit)  74 bpm    Heart Rate (Exercise)  87 bpm    Heart Rate (Exit)  71 bpm    Oxygen Saturation (Admit)  98 %    Oxygen Saturation (Exercise)  95 %    Oxygen Saturation (Exit)  98 %    Rating of Perceived Exertion (Exercise)  13    Perceived Dyspnea (Exercise)  1    Symptoms  none    Duration  Continue with 45 min of aerobic exercise without signs/symptoms of physical distress.    Intensity  THRR unchanged      Progression   Progression  Continue to progress workloads to maintain intensity without signs/symptoms of physical distress.    Average METs  3.27      Resistance Training   Training Prescription  Yes  Weight  3 lbs    Reps  10-15      Interval Training   Interval Training  No      Treadmill   MPH  3    Grade  1.5    Minutes  15    METs  3.92      NuStep   Level  3    Minutes  15    METs  2      REL-XR   Level  3    Minutes  15    METs  3.9      Home Exercise Plan   Plans to continue exercise at  Home (comment)   walking   Frequency  Add 2 additional days to program exercise sessions.    Initial Home Exercises Provided  01/21/19       Nutrition:  Target Goals: Understanding of nutrition guidelines, daily intake of sodium '1500mg'$ , cholesterol '200mg'$ , calories 30% from fat and 7% or less from saturated fats, daily to have 5 or more servings of fruits and vegetables.  Biometrics: Pre Biometrics - 01/06/19 1541      Pre  Biometrics   Height  5' 1.5" (1.562 m)    Weight  137 lb 8 oz (62.4 kg)    Waist Circumference  41.5 inches    Hip Circumference  39.5 inches    Waist to Hip Ratio  1.05 %    BMI (Calculated)  25.56    Single Leg Stand  4.04 seconds        Nutrition Therapy Plan and Nutrition Goals: Nutrition Therapy & Goals - 03/04/19 1154      Nutrition Therapy   Protein (specify units)  45-50g   ~1100kcal   Fiber  25 grams    Whole Grain Foods  3 servings    Fruits and Vegetables  5 servings/day    Sodium  2 grams      Personal Nutrition Goals   Nutrition Goal  ST: eat protein after workouts to help with muscle gain, eat more whole grains such as whole grain sourdough and whole wheat pasta with beans. LT: Lose weight    Comments  She feels she is eating less than she did a year ago and is still gaining weight (about 1lb per month) - discussed aging process and body composition, she eats fruits and vegetables regularly, eats 1/2 grapefruit for breakfast with oats (aware that there is a medication interaction with grapefruit), eats unsaturated fats and lean meats, limits high fat and high sodium foods.        Intervention Plan   Intervention  Prescribe, educate and counsel regarding individualized specific dietary modifications aiming towards targeted core components such as weight, hypertension, lipid management, diabetes, heart failure and other comorbidities.    Expected Outcomes  Short Term Goal: A plan has been developed with personal nutrition goals set during dietitian appointment.;Long Term Goal: Adherence to prescribed nutrition plan.       Nutrition Assessments: Nutrition Assessments - 01/06/19 1459      MEDFICTS Scores   Pre Score  24       Nutrition Goals Re-Evaluation:   Nutrition Goals Discharge (Final Nutrition Goals Re-Evaluation):   Psychosocial: Target Goals: Acknowledge presence or absence of significant depression and/or stress, maximize coping skills, provide  positive support system. Participant is able to verbalize types and ability to use techniques and skills needed for reducing stress and depression.   Initial Review & Psychosocial Screening: Initial Psych Review &  Screening - 01/06/19 1516      Initial Review   Current issues with  History of Depression;Current Depression;Current Stress Concerns    Source of Stress Concerns  Chronic Illness;Family    Comments  Her husband died recently and is coping with the loss. She is trying to get used to living alone.      Family Dynamics   Good Support System?  Yes    Comments  She can look to her two kids and three grandsons.      Barriers   Psychosocial barriers to participate in program  The patient should benefit from training in stress management and relaxation.      Screening Interventions   Interventions  Encouraged to exercise;To provide support and resources with identified psychosocial needs;Program counselor consult;Provide feedback about the scores to participant    Expected Outcomes  Short Term goal: Utilizing psychosocial counselor, staff and physician to assist with identification of specific Stressors or current issues interfering with healing process. Setting desired goal for each stressor or current issue identified.;Long Term Goal: Stressors or current issues are controlled or eliminated.;Short Term goal: Identification and review with participant of any Quality of Life or Depression concerns found by scoring the questionnaire.;Long Term goal: The participant improves quality of Life and PHQ9 Scores as seen by post scores and/or verbalization of changes       Quality of Life Scores:  Scores of 19 and below usually indicate a poorer quality of life in these areas.  A difference of  2-3 points is a clinically meaningful difference.  A difference of 2-3 points in the total score of the Quality of Life Index has been associated with significant improvement in overall quality of life,  self-image, physical symptoms, and general health in studies assessing change in quality of life.  PHQ-9: Recent Review Flowsheet Data    Depression screen [facility] 2/9 01/06/2019 05/28/2018 05/27/2017 09/28/2016 07/19/2015   Decreased Interest 1 0 0 0 0   Down, Depressed, Hopeless 0 0 0 0 0   PHQ - 2 Score 1 0 0 0 0   Altered sleeping 1 - - - -   Tired, decreased energy 1 - - - -   Change in appetite 0 - - - -   Feeling bad or failure about yourself  0 - - - -   Trouble concentrating 0 - - - -   Moving slowly or fidgety/restless 0 - - - -   Suicidal thoughts 0 - - - -   PHQ-9 Score 3 - - - -   Difficult doing work/chores Not difficult at all - - - -     Interpretation of Total Score  Total Score Depression Severity:  1-4 = Minimal depression, 5-9 = Mild depression, 10-14 = Moderate depression, 15-19 = Moderately severe depression, 20-27 = Severe depression   Psychosocial Evaluation and Intervention: Psychosocial Evaluation - 01/28/19 1237      Psychosocial Evaluation & Interventions   Interventions  Stress management education;Encouraged to exercise with the program and follow exercise prescription    Comments  Counselor met with Ms. Hickling Jaclyn Shaggy) today for initial psychosocial evaluation.    She just turned 85 years old two days ago and has been experiencing shortness of breath.  Itati has a strong support system with her son and his family; a daughter and her family and some friends.  She is not sleeping well and doesn't have much of an appetite since her spouse died 13 months ago.  She denies a history of depression or anxiety and her mood is generally positive.  Her primary stress at this time is learning to live alone and her current health condition.  She has goals to improve her breathing in order to return to the [employer] class.   Staff will follow with her.     Expected Outcomes  Short:  Elizabeth [name] will exercise consistently for her health and to provide some social interactions while  she is missing so many loved ones.  Long:  Elizabeth [name] will get back into [employer] and continue to exercise for her health and mental health.      Continue Psychosocial Services   Follow up required by staff       Psychosocial Re-Evaluation: Psychosocial Re-Evaluation    Elizabeth [name] 02/20/19 1234             Psychosocial Re-Evaluation   Current issues with  Current Depression;History of Depression;Current Stress Concerns;Current Sleep Concerns       Comments  Elizabeth [name] continues to struggle to sleep at night, but has noticed an increase in sleep time since starting the program. She has been so busy in the past with her children, taking care of her husband before he passed, and job. Now she is trying to find a new groove being alone at home. SHe is supposed to see a neurologist next week to check on her dizziness and memory. She loves having her grandson over weekly for dinner and she has gotten back to painting which has been a huge excitement for her. 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 and continue coming to [facility] for socialization Long: Elizabeth [name] will find hobbies to fill her extra time and keep her "mentally sharp"        Continue Psychosocial Services   Follow up required by staff          Psychosocial Discharge (Final Psychosocial Re-Evaluation): Psychosocial Re-Evaluation - 02/20/19 1234      Psychosocial Re-Evaluation   Current issues with  Current Depression;History of Depression;Current Stress Concerns;Current Sleep Concerns    Comments  Elizabeth [name] continues to struggle to sleep at night, but has noticed an increase in sleep time since starting the program. She has been so busy in the past with her children, taking care of her husband before he passed, and job. Now she is trying to find a new groove being alone at home. SHe is supposed to see a  neurologist next week to check on her dizziness and memory. She loves having her grandson over weekly for dinner and she has gotten back to painting which has been a huge excitement for her. 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 Participant will state understanding/return demonstration of topics presented.  Learning Barriers/Preferences: Learning Barriers/Preferences - 01/06/19 1516      Learning Barriers/Preferences  Learning Barriers  Hearing    Learning Preferences  None       Education Topics:  Initial Evaluation Education: - Verbal, written and demonstration of respiratory meds, oximetry and breathing techniques. Instruction on use of nebulizers and MDIs and importance of monitoring MDI activations.   Pulmonary Rehab from 03/04/2019 in [facility] Cardiac and Pulmonary Rehab  Date  01/06/19  Educator  [facility]  Instruction Review Code  1- Verbalizes Understanding      General Nutrition Guidelines/Fats and Fiber: -Group instruction provided by verbal, written material, models and posters to present the general guidelines for heart healthy nutrition. Gives an explanation and review of dietary fats and fiber.   Pulmonary Rehab from 03/04/2019 in [facility] Cardiac and Pulmonary Rehab  Date  02/04/19  Educator  [facility]  Instruction Review Code  1- Verbalizes Understanding      Controlling Sodium/Reading Food Labels: -Group verbal and written material supporting the discussion of sodium use in heart healthy nutrition. Review and explanation with models,  verbal and written materials for utilization of the food label.   Exercise Physiology & General Exercise Guidelines: - Group verbal and written instruction with models to review the exercise physiology of the cardiovascular system and associated critical values. Provides general exercise guidelines with specific guidelines to those with heart or lung disease.    Pulmonary Rehab from 03/04/2019 in [facility] Cardiac and Pulmonary Rehab  Date  01/14/19  Educator  [facility]  Instruction Review Code  1- Verbalizes Understanding      Aerobic Exercise & Resistance Training: - Gives group verbal and written instruction on the various components of exercise. Focuses on aerobic and resistive training programs and the benefits of this training and how to safely progress through these programs.   Pulmonary Rehab from 03/04/2019 in [facility] Cardiac and Pulmonary Rehab  Date  01/16/19  Educator  [facility]  Instruction Review Code  1- Verbalizes Understanding      Flexibility, Balance, Mind/Body Relaxation: Provides group verbal/written instruction on the benefits of flexibility and balance training, including mind/body exercise modes such as yoga, pilates and tai chi.  Demonstration and skill practice provided.   Pulmonary Rehab from 03/04/2019 in [facility] Cardiac and Pulmonary Rehab  Date  01/21/19  Educator  [facility]  Instruction Review Code  1- Verbalizes Understanding      Stress and Anxiety: - Provides group verbal and written instruction about the health risks of elevated stress and causes of high stress.  Discuss the correlation between heart/lung disease and anxiety and treatment options. Review healthy ways to manage with stress and anxiety.   Pulmonary Rehab from 03/04/2019 in [facility] Cardiac and Pulmonary Rehab  Date  01/28/19  Educator  [facility]  Instruction Review Code  1- Verbalizes Understanding      Depression: - Provides group verbal and written instruction on the correlation between heart/lung disease and depressed  mood, treatment options, and the stigmas associated with seeking treatment.   Pulmonary Rehab from 03/04/2019 in [facility] Cardiac and Pulmonary Rehab  Date  02/25/19  Educator  [place]  Instruction Review Code  1- Verbalizes Understanding      Exercise & Equipment Safety: - Individual verbal instruction and demonstration of equipment use and safety with use of the equipment.   Pulmonary Rehab from 03/04/2019 in [facility] Cardiac and Pulmonary Rehab  Date  01/06/19  Educator  [facility]  Instruction Review Code  1- Verbalizes Understanding      Infection Prevention: - Provides verbal and written material to individual with discussion of  infection control including proper hand washing and proper equipment cleaning during exercise session.   Pulmonary Rehab from 03/04/2019 in [facility] Cardiac and Pulmonary Rehab  Date  01/06/19  Educator  [facility]  Instruction Review Code  1- Verbalizes Understanding      Falls Prevention: - Provides verbal and written material to individual with discussion of falls prevention and safety.   Pulmonary Rehab from 03/04/2019 in [facility] Cardiac and Pulmonary Rehab  Date  01/06/19  Educator  [facility]  Instruction Review Code  1- Verbalizes Understanding      Diabetes: - Individual verbal and written instruction to review signs/symptoms of diabetes, desired ranges of glucose level fasting, after meals and with exercise. Advice that pre and post exercise glucose checks will be done for 3 sessions at entry of program.   Chronic Lung Diseases: - Group verbal and written instruction to review updates, respiratory medications, advancements in procedures and treatments. Discuss use of supplemental oxygen including available portable oxygen systems, continuous and intermittent flow rates, concentrators, personal use and safety guidelines. Review proper use of inhaler and spacers. Provide informative websites for self-education.    Pulmonary Rehab from 03/04/2019 in [facility] Cardiac and Pulmonary Rehab   Date  01/30/19  Educator  [facility]  Instruction Review Code  1- Verbalizes Understanding      Energy Conservation: - Provide group verbal and written instruction for methods to conserve energy, plan and organize activities. Instruct on pacing techniques, use of adaptive equipment and posture/positioning to relieve shortness of breath.   Pulmonary Rehab from 03/04/2019 in [facility] Cardiac and Pulmonary Rehab  Date  02/27/19  Educator  [facility]  Instruction Review Code  1- Verbalizes Understanding      Triggers and Exacerbations: - Group verbal and written instruction to review types of environmental triggers and ways to prevent exacerbations. Discuss weather changes, air quality and the benefits of nasal washing. Review warning signs and symptoms to help prevent infections. Discuss techniques for effective airway clearance, coughing, and vibrations.   AED/CPR: - Group verbal and written instruction with the use of models to demonstrate the basic use of the AED with the basic ABC's of resuscitation.   Anatomy and Physiology of the Lungs: - Group verbal and written instruction with the use of models to provide basic lung anatomy and physiology related to function, structure and complications of lung disease.   Pulmonary Rehab from 03/04/2019 in [facility] Cardiac and Pulmonary Rehab  Date  03/04/19  Educator  [facility]  Instruction Review Code  1- Verbalizes Understanding      Anatomy & Physiology of the Heart: - Group verbal and written instruction and models provide basic cardiac anatomy and physiology, with the coronary electrical and arterial systems. Review of Valvular disease and Heart Failure   Cardiac Medications: - Group verbal and written instruction to review commonly prescribed medications for heart disease. Reviews the medication, class of the drug, and side effects.   Know Your Numbers and Risk Factors: -Group verbal and written instruction about important numbers in your health.  Discussion  of what are risk factors and how they play a role in the disease process.  Review of Cholesterol, Blood Pressure, Diabetes, and BMI and the role they play in your overall health.   Pulmonary Rehab from 03/04/2019 in [facility] Cardiac and Pulmonary Rehab  Date  02/18/19  Educator  [facility]  Instruction Review Code  1- Verbalizes Understanding      Sleep Hygiene: -Provides group verbal and written instruction about how sleep can affect  your health.  Define sleep hygiene, discuss sleep cycles and impact of sleep habits. Review good sleep hygiene tips.    Other: -Provides group and verbal instruction on various topics (see comments)    Knowledge Questionnaire Score: Knowledge Questionnaire Score - 01/06/19 1500      Knowledge Questionnaire Score   Pre Score  15/18   reviewed with patient       Core Components/Risk Factors/Patient Goals at Admission: Personal Goals and Risk Factors at Admission - 01/06/19 1509      Core Components/Risk Factors/Patient Goals on Admission    Weight Management  Yes;Weight Maintenance    Intervention  Weight Management: Provide education and appropriate resources to help participant work on and attain dietary goals.;Weight Management: Develop a combined nutrition and exercise program designed to reach desired caloric intake, while maintaining appropriate intake of nutrient and fiber, sodium and fats, and appropriate energy expenditure required for the weight goal.;Weight Management/Obesity: Establish reasonable short term and long term weight goals.    Admit Weight  137 lb (62.1 kg)    Goal Weight: Short Term  132 lb (59.9 kg)    Goal Weight: Long Term  130 lb (59 kg)    Expected Outcomes  Short Term: Continue to assess and modify interventions until short term weight is achieved;Long Term: Adherence to nutrition and physical activity/exercise program aimed toward attainment of established weight goal;Weight Maintenance: Understanding of the daily nutrition guidelines,  which includes 25-35% calories from fat, 7% or less cal from saturated fats, less than 210m cholesterol, less than 1.5gm of sodium, & 5 or more servings of fruits and vegetables daily;Understanding recommendations for meals to include 15-35% energy as protein, 25-35% energy from fat, 35-60% energy from carbohydrates, less than 2023mof dietary cholesterol, 20-35 gm of total fiber daily;Understanding of distribution of calorie intake throughout the day with the consumption of 4-5 meals/snacks    Improve shortness of breath with ADL's  Yes    Intervention  Provide education, individualized exercise plan and daily activity instruction to help decrease symptoms of SOB with activities of daily living.    Expected Outcomes  Short Term: Improve cardiorespiratory fitness to achieve a reduction of symptoms when performing ADLs;Long Term: Be able to perform more ADLs without symptoms or delay the onset of symptoms    Hypertension  Yes    Intervention  Provide education on lifestyle modifcations including regular physical activity/exercise, weight management, moderate sodium restriction and increased consumption of fresh fruit, vegetables, and low fat dairy, alcohol moderation, and smoking cessation.;Monitor prescription use compliance.    Expected Outcomes  Short Term: Continued assessment and intervention until BP is < 140/9050mG in hypertensive participants. < 130/27m74m in hypertensive participants with diabetes, heart failure or chronic kidney disease.;Long Term: Maintenance of blood pressure at goal levels.    Lipids  Yes    Intervention  Provide education and support for participant on nutrition & aerobic/resistive exercise along with prescribed medications to achieve LDL <70mg78mL >40mg.56mExpected Outcomes  Short Term: Participant states understanding of desired cholesterol values and is compliant with medications prescribed. Participant is following exercise prescription and nutrition guidelines.;Long  Term: Cholesterol controlled with medications as prescribed, with individualized exercise RX and with personalized nutrition plan. Value goals: LDL < 70mg, 29m> 40 mg.       Core Components/Risk Factors/Patient Goals Review:  Goals and Risk Factor Review    Row [name] 02/20/19 1231  Core Components/Risk Factors/Patient Goals Review   Personal Goals Review  Weight Management/Obesity;Improve shortness of breath with ADL's;Lipids;Hypertension       Review  Kaidence has enjoyed attending LW and can tell a slight difference already in her SOB. She reports not really getting SOB during activites, but more so at rest some days. She has lived an active lifestyle her whole life so she enjoys the challenge of physical activity. Her blood pressure and weight have been consistent        Expected Outcomes  Short: Eureka will continue to attend LW regularly to improve SOB and increase strength to help with ADLs Long: Saphira will maintain independence with ADLs by gaining strength.           Core Components/Risk Factors/Patient Goals at Discharge (Final Review):  Goals and Risk Factor Review - 02/20/19 1231      Core Components/Risk Factors/Patient Goals Review   Personal Goals Review  Weight Management/Obesity;Improve shortness of breath with ADL's;Lipids;Hypertension    Review  Aimee has enjoyed attending LW and can tell a slight difference already in her SOB. She reports not really getting SOB during activites, but more so at rest some days. She has lived an active lifestyle her whole life so she enjoys the challenge of physical activity. Her blood pressure and weight have been consistent     Expected Outcomes  Short: Junnie will continue to attend LW regularly to improve SOB and increase strength to help with ADLs Long: Cassi will maintain independence with ADLs by gaining strength.        ITP Comments: ITP Comments    Row [name] 01/06/19 1431 02/02/19 0845 03/02/19 0826 03/16/19  0937 03/30/19 0945   ITP Comments  Medical Evaluation completed. Chart sent for review and changes to Dr. Emily Filbert Director of [place]. Diagnosis can be found in [facility] encounter 11/13/2018  30 day review completed. ITP sent to Dr. Emily Filbert Director of [place]. Continue with ITP unless changes are made by physician.  30 day review completed. ITP sent to Dr. Emily Filbert Director of [place]. Continue with ITP unless changes are made by physician.  Our program is currently closed due to COVID-19.  We are communicating with patient via phone calls and emails.  30 day review completed. ITP sent to Dr. Emily Filbert for review,changes as needed and signature. Continue with ITP unless changes directed by Dr. Sabra Heck.       Comments:

## 2019-04-06 ENCOUNTER — Telehealth: Payer: Self-pay

## 2019-04-06 NOTE — Telephone Encounter (Signed)
Pt was called to informed that she needed to reschedule her appointment due to COVID-19.

## 2019-04-08 ENCOUNTER — Encounter: Payer: Medicare Other | Admitting: Obstetrics and Gynecology

## 2019-04-27 ENCOUNTER — Other Ambulatory Visit: Payer: Self-pay | Admitting: Internal Medicine

## 2019-04-27 ENCOUNTER — Other Ambulatory Visit: Payer: Self-pay | Admitting: Obstetrics and Gynecology

## 2019-04-27 DIAGNOSIS — N952 Postmenopausal atrophic vaginitis: Secondary | ICD-10-CM

## 2019-04-28 ENCOUNTER — Telehealth: Payer: Self-pay | Admitting: *Deleted

## 2019-04-28 NOTE — Telephone Encounter (Signed)
Called to check up on patient.  Left message.  Last attempt was a message left on 4/10. Also sent an email today with request to let us know how she is doing.

## 2019-04-30 ENCOUNTER — Encounter: Payer: Medicare Other | Admitting: Internal Medicine

## 2019-06-01 ENCOUNTER — Encounter: Payer: Self-pay | Admitting: Obstetrics and Gynecology

## 2019-06-08 ENCOUNTER — Ambulatory Visit: Payer: Medicare Other | Admitting: Internal Medicine

## 2019-06-08 ENCOUNTER — Ambulatory Visit: Payer: Medicare Other

## 2019-06-16 ENCOUNTER — Ambulatory Visit (INDEPENDENT_AMBULATORY_CARE_PROVIDER_SITE_OTHER): Payer: Medicare Other | Admitting: Obstetrics and Gynecology

## 2019-06-16 ENCOUNTER — Encounter: Payer: Self-pay | Admitting: Obstetrics and Gynecology

## 2019-06-16 ENCOUNTER — Other Ambulatory Visit: Payer: Self-pay

## 2019-06-16 VITALS — BP 136/103 | HR 81 | Ht 61.5 in | Wt 138.0 lb

## 2019-06-16 DIAGNOSIS — Z4689 Encounter for fitting and adjustment of other specified devices: Secondary | ICD-10-CM

## 2019-06-16 DIAGNOSIS — N952 Postmenopausal atrophic vaginitis: Secondary | ICD-10-CM

## 2019-06-16 DIAGNOSIS — N814 Uterovaginal prolapse, unspecified: Secondary | ICD-10-CM

## 2019-06-16 DIAGNOSIS — L292 Pruritus vulvae: Secondary | ICD-10-CM

## 2019-06-16 NOTE — Progress Notes (Signed)
Pt is present today for pessary check. Pt is present today for pessary care. Pt stated that she was doing well.

## 2019-06-16 NOTE — Progress Notes (Signed)
    GYNECOLOGY PROGRESS NOTE  Subjective:    Patient ID: Elizabeth [name], female    DOB: [birth year], 85 y.o.   MRN: [id number]  HPI  Patient is a 85 y.o. G5P2002 female who presents for pessary check. Notes that she is doing fairly well today.   She denies pelvic discomfort, difficulty urinating. She is noting some issues with vaginal spotting/bleeding. She also notes an increase in her vaginal discharge and odor. Is also noting vulvar itching, is occasionally using Clotrimazole cream at home.   The following portions of the patient's history were reviewed and updated as appropriate: allergies, current medications, past family history, past medical history, past social history, past surgical history and problem list.   Review of Systems Pertinent items noted in HPI and remainder of comprehensive ROS otherwise negative.  Objective:    Blood pressure (!) 136/103, pulse 81, height 5' 1.5" (1.562 m), weight 138 lb (62.6 kg). General appearance: alert and no distress Pelvis: The patient's  Size 2 3/4 Gelhorn pessary was removed, cleaned, and re-inserted today.  Speculum examination revealed mildly atrophic vaginal mucosa with no lesions or lacerations.  Scant amount of thin watery white-yellow discharge, no odor.   Microscopic wet-mount exam shows negative for pathogens, normal epithelial cells.   Assessment:   Pessary maintenance Vaginal atrophy Cystocele grade 3, Uterine prolapse Grade 1   Plan:   - Pessary removed today at patient's request. Desires to leave it out to see how her symptoms respond.  -  Continue use of estrogen cream once weekly.  Can discontinue use of Trimosan gel for now.  - Wet prep negative today. No signs of infection. Likely leukorrhea.  - Vulva appears normal. Can continue occasional use of Clotrimazole cream as needed. Also advised on overuse of wipes/tissue as it can cause local irritation. If still noted at next visit, may need to consider biopsy to assess  for lichen sclerosis.  - Return to clinic in 8-10 weeks if she desire pessary reinsertion.    Rubie Maid, MD [facility]

## 2019-06-25 ENCOUNTER — Ambulatory Visit (INDEPENDENT_AMBULATORY_CARE_PROVIDER_SITE_OTHER): Payer: Medicare Other | Admitting: Internal Medicine

## 2019-06-25 ENCOUNTER — Other Ambulatory Visit: Payer: Self-pay

## 2019-06-25 DIAGNOSIS — K219 Gastro-esophageal reflux disease without esophagitis: Secondary | ICD-10-CM

## 2019-06-25 DIAGNOSIS — Z9109 Other allergy status, other than to drugs and biological substances: Secondary | ICD-10-CM | POA: Diagnosis not present

## 2019-06-25 DIAGNOSIS — R42 Dizziness and giddiness: Secondary | ICD-10-CM

## 2019-06-25 DIAGNOSIS — N812 Incomplete uterovaginal prolapse: Secondary | ICD-10-CM | POA: Diagnosis not present

## 2019-06-25 DIAGNOSIS — I10 Essential (primary) hypertension: Secondary | ICD-10-CM

## 2019-06-25 DIAGNOSIS — I471 Supraventricular tachycardia, unspecified: Secondary | ICD-10-CM

## 2019-06-25 DIAGNOSIS — F439 Reaction to severe stress, unspecified: Secondary | ICD-10-CM

## 2019-06-25 DIAGNOSIS — E7800 Pure hypercholesterolemia, unspecified: Secondary | ICD-10-CM

## 2019-06-25 NOTE — Progress Notes (Signed)
Patient ID: Elizabeth [name], female   DOB: 09/02/1927, 85 y.o.   MRN: [id number] .   Virtual Visit via telephone Note  This visit type was conducted due to national recommendations for restrictions regarding the COVID-19 pandemic (e.g. social distancing).  This format is felt to be most appropriate for this patient at this time.  All issues noted in this document were discussed and addressed.  No physical exam was performed (except for noted visual exam findings with Video Visits).   I connected with Elizabeth [name] by a video enabled telemedicine application or telephone and verified that I am speaking with the correct person using two identifiers. Location patient: home Location provider: work  Persons participating in the telephone visit: patient, provider  I discussed the limitations, risks, security and privacy concerns of performing an evaluation and management service by telephone and the availability of in person appointments.  The patient expressed understanding and agreed to proceed.   Reason for visit: scheduled follow up  HPI: She reports she is doing relatively well.  Trying to stay active.  Walking on her treadmill.  No chest pain with increased activity or exertion.  Sees cardiology for f/u SVT.  Last evaluated 11/2018.  On metoprolol.  States episodes are less frequent.  Was going to pulmonary/cardiac rehab.  Has been unable to go now secondary to covid restrictions.  She is walking 30 minutes per day.  Previous dizziness.  Still some occasional issues.  Some unsteadiness.  Discussed therpay.  She was referred to neurology.  She cancelled.  Does not feel needed.  Desires no further testing.  Stable. No headache.  No syncope or near syncopal episodes.  No acid reflux.  No abdominal pain.  Bowels moving.  Seeing Dr Valentino Saxonherry for f/u pessary.  Pessary out for now.  Wants to discuss with Dr Valentino Saxonherry being fitted for different pessary to see if fits better.  States weight 138 pounds.    ROS: See  pertinent positives and negatives per HPI.  Past Medical History:  Diagnosis Date  . Cystocele or rectocele with incomplete uterine prolapse   . Hyperlipidemia   . Hypertension   . Irritable bowel syndrome   . Palpitations   . Vaginal atrophy     Past Surgical History:  Procedure Laterality Date  . CATARACT EXTRACTION  2006    Family History  Problem Relation Age of Onset  . Transient ischemic attack Mother        multiple   . Dementia Mother   . Stroke Father   . Heart disease Brother        MI - died age 83  . Leukemia Brother   . Lung cancer Brother   . Throat cancer Brother        had heart disease also  . Heart disease Sister   . Dementia Sister     SOCIAL HX: reviewed.    Current Outpatient Medications:  .  ACCUPRIL 40 MG tablet, TAKE 1 TABLET BY MOUTH ONCE DAILY, Disp: 90 tablet, Rfl: 1 .  aspirin 81 MG tablet, Take 81 mg by mouth daily., Disp: , Rfl:  .  cetirizine (ZYRTEC) 10 MG tablet, Take 10 mg by mouth daily as needed. , Disp: , Rfl:  .  Cholecalciferol (VITAMIN D-3) 1000 UNITS CAPS, Take by mouth., Disp: , Rfl:  .  estradiol (ESTRACE) 0.1 MG/GM vaginal cream, PLACE 1 APPLICATORFUL VAGINALLY 2 TIMES A WEEK, Disp: 42.5 g, Rfl: 2 .  hydrALAZINE (APRESOLINE) 25 MG tablet,  Take 1 tablet (25 mg total) by mouth 3 (three) times daily as needed., Disp: 90 tablet, Rfl: 6 .  LIPITOR 40 MG tablet, TAKE 1 TABLET BY MOUTH ONCE DAILY, Disp: 30 tablet, Rfl: 5 .  magnesium oxide (MAG-OX) 400 (241.3 Mg) MG tablet, TAKE 1 TABLET BY MOUTH ONCE DAILY, Disp: 30 tablet, Rfl: 6 .  metoprolol tartrate (LOPRESSOR) 25 MG tablet, Take 1 tablet (25 mg total) by mouth 2 (two) times daily., Disp: 180 tablet, Rfl: 2 .  triamcinolone (NASACORT) 55 MCG/ACT nasal inhaler, Place 2 sprays into the nose daily as needed. , Disp: , Rfl:   EXAM:  GENERAL: alert.  Sounds to be in no acute distress.  Answering questions appropriately.    PSYCH/NEURO: pleasant and cooperative, no obvious  depression or anxiety, speech and thought processing grossly intact  ASSESSMENT AND PLAN:  Discussed the following assessment and plan:  Dizziness Has seen ENT.  Has a history of vertigo.  Has seen cardiology.  Has diagnosis of SVT.  Better controlled on metoprolol.  Declines further evaluation.  Feels stable.  Declines therapy.  Follow.    Environmental allergies Controlled with zyrtec.    First degree uterine prolapse Followed by Dr Marcelline Mates.  Pessary just removed.    GERD (gastroesophageal reflux disease) Controlled.    Hypercholesterolemia On lipitor.  Low cholesterol diet and exercise.  Follow lipid panel and liver function tests.    Hypertension Blood pressure has been under good control.  Continue current medication regimen.  Follow pressures.  Follow metabolic panel.    SVT (supraventricular tachycardia) (HCC) Episodes less frequent on metoprolol.  Continue f/u with cardiology.    Stress Increased stress. Discussed with her today.  Does not feel she needs any further intervention.  Follow.      I discussed the assessment and treatment plan with the patient. The patient was provided an opportunity to ask questions and all were answered. The patient agreed with the plan and demonstrated an understanding of the instructions.   The patient was advised to call back or seek an in-person evaluation if the symptoms worsen or if the condition fails to improve as anticipated.  I provided 25 minutes of non-face-to-face time during this encounter.   Einar Pheasant, MD

## 2019-06-28 ENCOUNTER — Encounter: Payer: Self-pay | Admitting: Internal Medicine

## 2019-06-28 NOTE — Assessment & Plan Note (Signed)
Controlled with zyrtec 

## 2019-06-28 NOTE — Assessment & Plan Note (Signed)
Followed by Dr Marcelline Mates.  Pessary just removed.

## 2019-06-28 NOTE — Assessment & Plan Note (Signed)
On lipitor.  Low cholesterol diet and exercise.  Follow lipid panel and liver function tests.   

## 2019-06-28 NOTE — Assessment & Plan Note (Signed)
Has seen ENT.  Has a history of vertigo.  Has seen cardiology.  Has diagnosis of SVT.  Better controlled on metoprolol.  Declines further evaluation.  Feels stable.  Declines therapy.  Follow.

## 2019-06-28 NOTE — Assessment & Plan Note (Signed)
Episodes less frequent on metoprolol.  Continue f/u with cardiology.

## 2019-06-28 NOTE — Assessment & Plan Note (Signed)
Controlled.  

## 2019-06-28 NOTE — Assessment & Plan Note (Signed)
Increased stress.  Discussed with her today.  Does not feel she needs any further intervention.  Follow.  

## 2019-06-28 NOTE — Assessment & Plan Note (Signed)
Blood pressure has been under good control.  Continue current medication regimen.  Follow pressures.  Follow metabolic panel.  

## 2019-07-16 ENCOUNTER — Other Ambulatory Visit: Payer: Self-pay

## 2019-07-16 ENCOUNTER — Other Ambulatory Visit (INDEPENDENT_AMBULATORY_CARE_PROVIDER_SITE_OTHER): Payer: Medicare Other

## 2019-07-16 ENCOUNTER — Telehealth: Payer: Self-pay | Admitting: Internal Medicine

## 2019-07-16 DIAGNOSIS — E7800 Pure hypercholesterolemia, unspecified: Secondary | ICD-10-CM | POA: Diagnosis not present

## 2019-07-16 DIAGNOSIS — I10 Essential (primary) hypertension: Secondary | ICD-10-CM | POA: Diagnosis not present

## 2019-07-16 LAB — CBC WITH DIFFERENTIAL/PLATELET
Basophils Absolute: 0 10*3/uL (ref 0.0–0.1)
Basophils Relative: 0.7 % (ref 0.0–3.0)
Eosinophils Absolute: 0.1 10*3/uL (ref 0.0–0.7)
Eosinophils Relative: 2 % (ref 0.0–5.0)
HCT: 42.6 % (ref 36.0–46.0)
Hemoglobin: 14 g/dL (ref 12.0–15.0)
Lymphocytes Relative: 45.9 % (ref 12.0–46.0)
Lymphs Abs: 3.1 10*3/uL (ref 0.7–4.0)
MCHC: 32.9 g/dL (ref 30.0–36.0)
MCV: 93.5 fl (ref 78.0–100.0)
Monocytes Absolute: 0.5 10*3/uL (ref 0.1–1.0)
Monocytes Relative: 6.9 % (ref 3.0–12.0)
Neutro Abs: 3 10*3/uL (ref 1.4–7.7)
Neutrophils Relative %: 44.5 % (ref 43.0–77.0)
Platelets: 230 10*3/uL (ref 150.0–400.0)
RBC: 4.55 Mil/uL (ref 3.87–5.11)
RDW: 13.6 % (ref 11.5–15.5)
WBC: 6.7 10*3/uL (ref 4.0–10.5)

## 2019-07-16 LAB — BASIC METABOLIC PANEL
BUN: 22 mg/dL (ref 6–23)
CO2: 20 mEq/L (ref 19–32)
Calcium: 9.1 mg/dL (ref 8.4–10.5)
Chloride: 104 mEq/L (ref 96–112)
Creatinine, Ser: 1.08 mg/dL (ref 0.40–1.20)
GFR: 47.61 mL/min — ABNORMAL LOW (ref >60.00)
Glucose, Bld: 94 mg/dL (ref 70–99)
Potassium: 4.4 mEq/L (ref 3.5–5.1)
Sodium: 133 mEq/L — ABNORMAL LOW (ref 135–145)

## 2019-07-16 LAB — LIPID PANEL
Cholesterol: 151 mg/dL (ref 0–200)
HDL: 48.9 mg/dL (ref >39.00)
LDL Cholesterol: 81 mg/dL (ref 0–99)
NonHDL: 101.77
Total CHOL/HDL Ratio: 3
Triglycerides: 104 mg/dL (ref 0.0–149.0)
VLDL: 20.8 mg/dL (ref 0.0–40.0)

## 2019-07-16 LAB — HEPATIC FUNCTION PANEL
ALT: 19 U/L (ref 0–35)
AST: 20 U/L (ref 0–37)
Albumin: 4.3 g/dL (ref 3.5–5.2)
Alkaline Phosphatase: 57 U/L (ref 39–117)
Bilirubin, Direct: 0.1 mg/dL (ref 0.0–0.3)
Total Bilirubin: 0.5 mg/dL (ref 0.2–1.2)
Total Protein: 6 g/dL (ref 6.0–8.3)

## 2019-07-16 LAB — TSH: TSH: 3.44 u[IU]/mL (ref 0.35–4.50)

## 2019-07-16 NOTE — Telephone Encounter (Signed)
Reviewed blood pressure readings.  Overall under reasonable control.  Continue current medication regimen. continue to follow.

## 2019-07-16 NOTE — Telephone Encounter (Signed)
BP readings placed In results folder

## 2019-07-16 NOTE — Telephone Encounter (Signed)
Pt dropped off BP readings. Paper is up front in color folder.

## 2019-07-17 ENCOUNTER — Encounter: Payer: Self-pay | Admitting: Internal Medicine

## 2019-07-17 NOTE — Telephone Encounter (Signed)
Pt aware and will continue to do so

## 2019-07-23 ENCOUNTER — Telehealth: Payer: Self-pay | Admitting: *Deleted

## 2019-07-23 NOTE — Telephone Encounter (Signed)
Tammra -    Not ready to get out yet.  Continues to use the Treadmill 30 min a day   Doing squats daily.  Is gaining some weight. HAs already talked with RD and knows what to do.   Does have appt with MD soon.

## 2019-07-31 ENCOUNTER — Other Ambulatory Visit: Payer: Self-pay

## 2019-07-31 ENCOUNTER — Telehealth: Payer: Self-pay | Admitting: Internal Medicine

## 2019-07-31 ENCOUNTER — Emergency Department: Payer: Medicare Other

## 2019-07-31 ENCOUNTER — Emergency Department
Admission: EM | Admit: 2019-07-31 | Discharge: 2019-07-31 | Disposition: A | Discharge status: Home / Self Care | Payer: Medicare Other | Attending: Emergency Medicine | Admitting: Emergency Medicine

## 2019-07-31 ENCOUNTER — Encounter: Payer: Self-pay | Admitting: Emergency Medicine

## 2019-07-31 DIAGNOSIS — I10 Essential (primary) hypertension: Secondary | ICD-10-CM | POA: Insufficient documentation

## 2019-07-31 DIAGNOSIS — Z79899 Other long term (current) drug therapy: Secondary | ICD-10-CM | POA: Insufficient documentation

## 2019-07-31 DIAGNOSIS — R0789 Other chest pain: Secondary | ICD-10-CM | POA: Diagnosis not present

## 2019-07-31 DIAGNOSIS — Z7982 Long term (current) use of aspirin: Secondary | ICD-10-CM | POA: Insufficient documentation

## 2019-07-31 LAB — COMPREHENSIVE METABOLIC PANEL
ALT: 24 U/L (ref 0–44)
AST: 23 U/L (ref 15–41)
Albumin: 4.5 g/dL (ref 3.5–5.0)
Alkaline Phosphatase: 60 U/L (ref 38–126)
Anion gap: 9 (ref 5–15)
BUN: 23 mg/dL (ref 8–23)
CO2: 23 mmol/L (ref 22–32)
Calcium: 8.7 mg/dL — ABNORMAL LOW (ref 8.9–10.3)
Chloride: 103 mmol/L (ref 98–111)
Creatinine, Ser: 1.09 mg/dL — ABNORMAL HIGH (ref 0.44–1.00)
GFR calc Af Amer: 52 mL/min — ABNORMAL LOW (ref >60)
GFR calc non Af Amer: 45 mL/min — ABNORMAL LOW (ref >60)
Glucose, Bld: 102 mg/dL — ABNORMAL HIGH (ref 70–99)
Potassium: 4.5 mmol/L (ref 3.5–5.1)
Sodium: 135 mmol/L (ref 135–145)
Total Bilirubin: 0.5 mg/dL (ref 0.3–1.2)
Total Protein: 7.3 g/dL (ref 6.5–8.1)

## 2019-07-31 LAB — TROPONIN I (HIGH SENSITIVITY)
Troponin I (High Sensitivity): 4 ng/L (ref <18)
Troponin I (High Sensitivity): 5 ng/L (ref <18)

## 2019-07-31 LAB — CBC WITH DIFFERENTIAL/PLATELET
Abs Immature Granulocytes: 0.03 10*3/uL (ref 0.00–0.07)
Basophils Absolute: 0 10*3/uL (ref 0.0–0.1)
Basophils Relative: 1 %
Eosinophils Absolute: 0.1 10*3/uL (ref 0.0–0.5)
Eosinophils Relative: 2 %
HCT: 42.8 % (ref 36.0–46.0)
Hemoglobin: 14.2 g/dL (ref 12.0–15.0)
Immature Granulocytes: 0 %
Lymphocytes Relative: 35 %
Lymphs Abs: 2.6 10*3/uL (ref 0.7–4.0)
MCH: 30.8 pg (ref 26.0–34.0)
MCHC: 33.2 g/dL (ref 30.0–36.0)
MCV: 92.8 fL (ref 80.0–100.0)
Monocytes Absolute: 0.5 10*3/uL (ref 0.1–1.0)
Monocytes Relative: 7 %
Neutro Abs: 4.1 10*3/uL (ref 1.7–7.7)
Neutrophils Relative %: 55 %
Platelets: 256 10*3/uL (ref 150–400)
RBC: 4.61 MIL/uL (ref 3.87–5.11)
RDW: 13.5 % (ref 11.5–15.5)
WBC: 7.5 10*3/uL (ref 4.0–10.5)
nRBC: 0 % (ref 0.0–0.2)

## 2019-07-31 LAB — LIPASE, BLOOD: Lipase: 28 U/L (ref 11–51)

## 2019-07-31 MED ORDER — LIDOCAINE VISCOUS HCL 2 % MT SOLN
15.0000 mL | Freq: Once | OROMUCOSAL | Status: AC
  Administered 2019-07-31: 15 mL via ORAL
  Filled 2019-07-31: qty 15

## 2019-07-31 MED ORDER — ALUM & MAG HYDROXIDE-SIMETH 200-200-20 MG/5ML PO SUSP
30.0000 mL | Freq: Once | ORAL | Status: AC
  Administered 2019-07-31: 30 mL via ORAL
  Filled 2019-07-31: qty 30

## 2019-07-31 MED ORDER — KETOROLAC TROMETHAMINE 30 MG/ML IJ SOLN
15.0000 mg | Freq: Once | INTRAMUSCULAR | Status: AC
  Administered 2019-07-31: 15 mg via INTRAVENOUS
  Filled 2019-07-31: qty 1

## 2019-07-31 NOTE — ED Provider Notes (Signed)
[facility] Emergency Department Provider Note  ____________________________________________   First MD Initiated Contact with Patient 07/31/19 1000     (approximate)  I have reviewed the triage vital signs and the nursing notes.   HISTORY  Chief Complaint Chest Pain    HPI Elizabeth [name] is a 85 y.o. female with history as below here with atypical chest pain.  Patient states that over the last several days, she has noticed intermittent burning type epigastric and left upper quadrant pain.  This feels like it radiates into her left chest.  She states it is sometimes worse with position changes and lying flat.  Denies any shortness of breath.  She also had one episode where she felt very hot and flushed after eating.  Denies any fevers.  No overt diaphoresis.  No history of coronary disease that she is aware of.  She is had previous negative stress test.  She states that she did not want to be here, but was sent by her PCP for further evaluation.  Denies known history of peptic ulcer disease.  No melena.  No heavy NSAID use.  No other acute complaints.  No history of PE.  No leg swelling.        Past Medical History:  Diagnosis Date  . Cystocele or rectocele with incomplete uterine prolapse   . Hyperlipidemia   . Hypertension   . Irritable bowel syndrome   . Palpitations   . Vaginal atrophy     Patient Active Problem List   Diagnosis Date Noted  . GERD (gastroesophageal reflux disease) 11/09/2018  . Dizziness 06/02/2017  . Vaginal atrophy 09/27/2016  . SVT (supraventricular tachycardia) (HCC) 01/27/2016  . Headache 01/26/2016  . Left arm numbness 01/26/2016  . Urethral caruncle 12/23/2015  . Vaginal pessary in situ 09/22/2015  . Health care maintenance 07/22/2015  . Baden-Walker grade 4 cystocele 06/26/2015  . First degree uterine prolapse 06/26/2015  . Hyperkalemia 04/27/2015  . Arrhythmia 04/27/2015  . SOB (shortness of breath) 04/27/2015  .  Double vision 07/18/2014  . Decreased hearing 07/18/2014  . Stress 07/18/2014  . Fatigue 07/18/2014  . Environmental allergies 07/09/2013  . Hypertension 12/09/2012  . Hypercholesterolemia 12/09/2012    Past Surgical History:  Procedure Laterality Date  . CATARACT EXTRACTION  2006    Prior to Admission medications   Medication Sig Start Date End Date Taking? Authorizing Provider  ACCUPRIL 40 MG tablet TAKE 1 TABLET BY MOUTH ONCE DAILY 04/27/19  Yes Dale DurhamScott, Charlene, MD  aspirin 81 MG tablet Take 81 mg by mouth daily.   Yes [provider]  cetirizine (ZYRTEC) 5 MG tablet Take 5 mg by mouth daily as needed.    Yes [provider]  estradiol (ESTRACE) 0.1 MG/GM vaginal cream PLACE 1 APPLICATORFUL VAGINALLY 2 TIMES A WEEK Patient taking differently: Place 1 Applicatorful vaginally once a week.  04/28/19  Yes Hildred Laserherry, Anika, MD  LIPITOR 40 MG tablet TAKE 1 TABLET BY MOUTH ONCE DAILY 02/17/19  Yes Dale DurhamScott, Charlene, MD  magnesium oxide (MAG-OX) 400 (241.3 Mg) MG tablet TAKE 1 TABLET BY MOUTH ONCE DAILY 07/28/18  Yes Dale DurhamScott, Charlene, MD  metoprolol tartrate (LOPRESSOR) 25 MG tablet Take 1 tablet (25 mg total) by mouth 2 (two) times daily. 12/08/18  Yes Dunn, Raymon Muttonyan M, PA-C  triamcinolone (NASACORT) 55 MCG/ACT nasal inhaler Place 2 sprays into the nose daily as needed.    Yes [provider]    Allergies Patient has no known allergies.  Family History  Problem Relation Age of Onset  . Transient ischemic attack Mother        multiple   . Dementia Mother   . Stroke Father   . Heart disease Brother        MI - died age 83  . Leukemia Brother   . Lung cancer Brother   . Throat cancer Brother        had heart disease also  . Heart disease Sister   . Dementia Sister     Social History Social History   Tobacco Use  . Smoking status: Never Smoker  . Smokeless tobacco: Never Used  Substance Use Topics  . Alcohol use: No    Alcohol/week: 0.0 standard drinks  . Drug  use: No    Review of Systems  Review of Systems  Constitutional: Positive for fatigue. Negative for fever.  HENT: Negative for congestion and sore throat.   Eyes: Negative for visual disturbance.  Respiratory: Positive for chest tightness. Negative for cough and shortness of breath.   Cardiovascular: Positive for chest pain.  Gastrointestinal: Negative for abdominal pain, diarrhea, nausea and vomiting.  Genitourinary: Negative for flank pain.  Musculoskeletal: Negative for back pain and neck pain.  Skin: Negative for rash and wound.  All other systems reviewed and are negative.    ____________________________________________  PHYSICAL EXAM:      VITAL SIGNS: ED Triage Vitals  Enc Vitals Group     BP 07/31/19 0955 (!) 176/74     Pulse Rate 07/31/19 0955 72     Resp 07/31/19 0955 16     Temp 07/31/19 0955 98.4 F (36.9 C)     Temp Source 07/31/19 0955 Oral     SpO2 07/31/19 0955 99 %     Weight --      Height --      Head Circumference --      Peak Flow --      Pain Score 07/31/19 0956 6     Pain Loc --      Pain Edu? --      Excl. in GC? --      Physical Exam Vitals signs and nursing note reviewed.  Constitutional:      General: She is not in acute distress.    Appearance: She is well-developed.  HENT:     Head: Normocephalic and atraumatic.  Eyes:     Conjunctiva/sclera: Conjunctivae normal.  Neck:     Musculoskeletal: Neck supple.  Cardiovascular:     Rate and Rhythm: Normal rate and regular rhythm.     Heart sounds: Normal heart sounds. No murmur. No friction rub.  Pulmonary:     Effort: Pulmonary effort is normal. No respiratory distress.     Breath sounds: Normal breath sounds. No wheezing or rales.     Comments: No overt chest wall tenderness to palpation Abdominal:     General: There is no distension.     Palpations: Abdomen is soft.     Tenderness: There is no abdominal tenderness.  Skin:    General: Skin is warm.     Capillary Refill:  Capillary refill takes less than 2 seconds.  Neurological:     Mental Status: She is alert and oriented to person, place, and time.     Motor: No abnormal muscle tone.       ____________________________________________   LABS (all labs ordered are listed, but only abnormal results are displayed)  Labs Reviewed  COMPREHENSIVE METABOLIC PANEL -  Abnormal; Notable for the following components:      Result Value   Glucose, Bld 102 (*)    Creatinine, Ser 1.09 (*)    Calcium 8.7 (*)    GFR calc non Af Amer 45 (*)    GFR calc Af Amer 52 (*)    All other components within normal limits  CBC WITH DIFFERENTIAL/PLATELET  LIPASE, BLOOD  TROPONIN I (HIGH SENSITIVITY)  TROPONIN I (HIGH SENSITIVITY)    ____________________________________________  EKG: Normal sinus rhythm, ventricular rate 73.  PR 172, QRS 76, QTc 420.  No acute ischemic changes. ________________________________________  RADIOLOGY All imaging, including plain films, CT scans, and ultrasounds, independently reviewed by me, and interpretations confirmed via formal radiology reads.  ED MD interpretation:   Chest x-ray: Clear  Official radiology report(s): Dg Chest 2 View  Result Date: 07/31/2019 CLINICAL DATA:  85 year old female with history of pain under the left shoulder blade radiating into the left arm for the past 2 days. EXAM: CHEST - 2 VIEW COMPARISON:  Chest x-ray 11/04/2018. FINDINGS: Lung volumes are normal. No consolidative airspace disease. No pleural effusions. No pneumothorax. No pulmonary nodule or mass noted. Pulmonary vasculature and the cardiomediastinal silhouette are within normal limits. Atherosclerosis in the thoracic aorta. IMPRESSION: 1.  No radiographic evidence of acute cardiopulmonary disease. 2. Aortic atherosclerosis. Electronically Signed   By: Vinnie Langton M.D.   On: 07/31/2019 12:13    ____________________________________________  PROCEDURES   Procedure(s) performed (including  Critical Care):  Procedures  ____________________________________________  INITIAL IMPRESSION / MDM / [place] / ED COURSE  As part of my medical decision making, I reviewed the following data within the electronic MEDICAL RECORD NUMBER Notes from prior ED visits and [place] Controlled Substance Database      *Luther was evaluated in Emergency Department on 07/31/2019 for the symptoms described in the history of present illness. She was evaluated in the context of the global COVID-19 pandemic, which necessitated consideration that the patient might be at risk for infection with the SARS-CoV-2 virus that causes COVID-19. Institutional protocols and algorithms that pertain to the evaluation of patients at risk for COVID-19 are in a state of rapid change based on information released by regulatory bodies including the CDC and federal and state organizations. These policies and algorithms were followed during the patient's care in the ED.  Some ED evaluations and interventions may be delayed as a result of limited staffing during the pandemic.*      Medical Decision Making: 85 year old female with very atypical chest pain.  Troponins negative x2 with symptoms greater than 24 hours and heart score of 4.  I discussed very low concern for acute angina/ischemic etiology, and patient declines further observation or stress testing for work-up.  Otherwise, her abdomen is completely soft, nontender, and nondistended and I do not suspect cholecystitis.  This could represent a moderate gastritis given burning component and advised over-the-counter antacids.  She is reluctant to start any new medications.  Otherwise, she is not hypoxic, short of breath, tachypneic, and shows no other evidence to suggest DVT or PE.  She would like to follow-up with her PCP as an outpatient which I think is reasonable given absence of any signs of acute ischemia or myocardial infarction.   ____________________________________________  FINAL CLINICAL IMPRESSION(S) / ED DIAGNOSES  Final diagnoses:  Atypical chest pain     MEDICATIONS GIVEN DURING THIS VISIT:  Medications  alum & mag hydroxide-simeth (MAALOX/MYLANTA) 200-200-20 MG/5ML suspension 30 mL (30  mLs Oral Given 07/31/19 1316)    And  lidocaine (XYLOCAINE) 2 % viscous mouth solution 15 mL (15 mLs Oral Given 07/31/19 1316)  ketorolac (TORADOL) 30 MG/ML injection 15 mg (15 mg Intravenous Given 07/31/19 1317)     ED Discharge Orders    None       Note:  This document was prepared using Dragon voice recognition software and may include unintentional dictation errors.   Shaune PollackIsaacs, Koleson Reifsteck, MD 07/31/19 [phone]

## 2019-07-31 NOTE — Telephone Encounter (Signed)
Patient called with Left sided back pain under the scapula area, radiating underarm and down into arm, patient says it comes and goes but is not affected by movement. Patient also noted yesterday and into last night she felt like indigestion in her chest,  Not relieved with antacids, patient says she took and 81 mg aspirin this morning and now she is feeling nauseated but they have not made her feel nauseated in the past. Advised patient she  Needs to be evaluated to rule out her heart, patient stated she is scared to got to the ER due to COVID advised patient it is safe as possible there due too all precautions taken. Patient ask that PCP be informed and if PCP decision she would go to ER. Consulted with PCP and decision was patient should be seen in ER and evaluated .  Patient refused EMS stated she will only let her son drive her this morning. Patient en route to ER.

## 2019-07-31 NOTE — ED Notes (Signed)
Pt states that she has pain under her left shoulder blade radiating down her arm. She states that this pain started two days ago, and has woken her up out of her sleep the past two nights. She states that the pain is intermittent. She states that when the pain occurs it is a 9 on a scale of 0-10 and it is a sharp pain; however, when the pain stops she feels discomfort. Pt states that she has felt nauseous, and tired the past few days.

## 2019-07-31 NOTE — Telephone Encounter (Signed)
Pt in ER

## 2019-07-31 NOTE — ED Notes (Signed)
Pt laying in bed speaking with this RN in NAD, pt reports pain has improved but states the pain does come and go, A&Ox4.

## 2019-07-31 NOTE — ED Triage Notes (Signed)
PT c/o CP x2days. With nausea. VSS

## 2019-08-06 ENCOUNTER — Other Ambulatory Visit: Payer: Self-pay | Admitting: Internal Medicine

## 2019-08-11 ENCOUNTER — Ambulatory Visit (INDEPENDENT_AMBULATORY_CARE_PROVIDER_SITE_OTHER): Payer: Medicare Other | Admitting: Obstetrics and Gynecology

## 2019-08-11 ENCOUNTER — Other Ambulatory Visit: Payer: Self-pay

## 2019-08-11 ENCOUNTER — Encounter: Payer: Self-pay | Admitting: Obstetrics and Gynecology

## 2019-08-11 VITALS — BP 134/79 | HR 65 | Ht 61.5 in | Wt 137.7 lb

## 2019-08-11 DIAGNOSIS — N814 Uterovaginal prolapse, unspecified: Secondary | ICD-10-CM | POA: Diagnosis not present

## 2019-08-11 DIAGNOSIS — N952 Postmenopausal atrophic vaginitis: Secondary | ICD-10-CM | POA: Diagnosis not present

## 2019-08-11 DIAGNOSIS — Z4689 Encounter for fitting and adjustment of other specified devices: Secondary | ICD-10-CM | POA: Diagnosis not present

## 2019-08-11 NOTE — Progress Notes (Signed)
Pt is present for pessary check. Pt stated that she is doing well and having no issues at this time with her pessary.

## 2019-08-11 NOTE — Progress Notes (Signed)
    GYNECOLOGY PROGRESS NOTE  Subjective:    Patient ID: Brylei Pedley Tolson, female    DOB: 1927/10/08, 85 y.o.   MRN: [id number]  HPI  Patient is a 85 y.o. G15P2002 female who presents for pessary check. Notes that she is doing fairly well today.   She denies pelvic discomfort, difficulty urinating. Notes that after taking a break from the pessary since last visit she has not had any vaginal discharge, and denies any pelvic pressure. Also denies any bleeding. Notes bowel movements have improved. She does note that she is going to the bathroom a little more frequently.   The following portions of the patient's history were reviewed and updated as appropriate: allergies, current medications, past family history, past medical history, past social history, past surgical history and problem list.   Review of Systems Pertinent items noted in HPI and remainder of comprehensive ROS otherwise negative.  Objective:    Blood pressure 134/79, pulse 65, height 5' 1.5" (1.562 m), weight 137 lb 11.2 oz (62.5 kg). General appearance: alert and no distress Pelvis: deferred today.   Assessment:   Pessary maintenance Vaginal atrophy Cystocele grade 3, Uterine prolapse Grade 1   Plan:   - Will continue to hold on pessary use.  -  Continue use of estrogen cream once weekly for vaginal atrophy.  - Return to clinic as needed if symptoms worsen, or if she desires pessary to be reinserted.     Rubie Maid, MD [facility]

## 2019-09-07 ENCOUNTER — Other Ambulatory Visit: Payer: Self-pay | Admitting: Internal Medicine

## 2019-09-09 DIAGNOSIS — R0609 Other forms of dyspnea: Secondary | ICD-10-CM

## 2019-09-09 NOTE — Progress Notes (Signed)
Pulmonary Individual Treatment Plan  Patient Details  Name: Elizabeth [name] MRN: [id number] Date of Birth: [birth year] Referring Provider:     Pulmonary Rehab from 01/06/2019 in [facility] Cardiac and Pulmonary Rehab  Referring Provider  Ida Rogue MD      Initial Encounter Date:    Pulmonary Rehab from 01/06/2019 in [facility] Cardiac and Pulmonary Rehab  Date  01/06/19      Visit Diagnosis: Dyspnea on exertion  Patient's Home Medications on Admission:  Current Outpatient Medications:  .  ACCUPRIL 40 MG tablet, TAKE 1 TABLET BY MOUTH ONCE DAILY, Disp: 90 tablet, Rfl: 1 .  aspirin 81 MG tablet, Take 81 mg by mouth daily., Disp: , Rfl:  .  cetirizine (ZYRTEC) 5 MG tablet, Take 5 mg by mouth daily as needed. , Disp: , Rfl:  .  estradiol (ESTRACE) 0.1 MG/GM vaginal cream, PLACE 1 APPLICATORFUL VAGINALLY 2 TIMES A WEEK (Patient taking differently: Place 1 Applicatorful vaginally once a week. ), Disp: 42.5 g, Rfl: 2 .  LIPITOR 40 MG tablet, TAKE 1 TABLET BY MOUTH ONCE DAILY, Disp: 90 tablet, Rfl: 3 .  magnesium oxide (MAG-OX) 400 (241.3 Mg) MG tablet, TAKE 1 TABLET BY MOUTH ONCE DAILY, Disp: 30 tablet, Rfl: 6 .  metoprolol tartrate (LOPRESSOR) 25 MG tablet, Take 1 tablet (25 mg total) by mouth 2 (two) times daily., Disp: 180 tablet, Rfl: 2 .  triamcinolone (NASACORT) 55 MCG/ACT nasal inhaler, Place 2 sprays into the nose daily as needed. , Disp: , Rfl:   Past Medical History: Past Medical History:  Diagnosis Date  . Cystocele or rectocele with incomplete uterine prolapse   . Hyperlipidemia   . Hypertension   . Irritable bowel syndrome   . Palpitations   . Vaginal atrophy     Tobacco Use: Social History   Tobacco Use  Smoking Status Never Smoker  Smokeless Tobacco Never Used    Labs: Recent Review Flowsheet Data    Labs for ITP Cardiac and Pulmonary Rehab Latest Ref Rng & Units 10/08/2017 02/10/2018 06/17/2018 10/15/2018 07/16/2019   Cholestrol 0 - 200 mg/dL 205(H) 154 185 170 151   LDLCALC 0 - 99 mg/dL 138(H) 80 111(H) 92 81   LDLDIRECT mg/dL - - - - -   HDL >39.00 mg/dL 45.00 54.50 57.10 57.00 48.90   Trlycerides 0.0 - 149.0 mg/dL 108.0 95.0 85.0 103.0 104.0       Pulmonary Assessment Scores:   UCSD: Self-administered rating of dyspnea associated with activities of daily living (ADLs) 6-point scale (0 = "not at all" to 5 = "maximal or unable to do because of breathlessness")  Scoring Scores range from 0 to 120.  Minimally important difference is 5 units  CAT: CAT can identify the health impairment of COPD patients and is better correlated with disease progression.  CAT has a scoring range of zero to 40. The CAT score is classified into four groups of low (less than 10), medium (10 - 20), high (21-30) and very high (31-40) based on the impact level of disease on health status. A CAT score over 10 suggests significant symptoms.  A worsening CAT score could be explained by an exacerbation, poor medication adherence, poor inhaler technique, or progression of COPD or comorbid conditions.  CAT MCID is 2 points  mMRC: mMRC (Modified Medical Research Council) Dyspnea Scale is used to assess the degree of baseline functional disability in patients of respiratory disease due to dyspnea. No minimal important difference is established. A decrease in score of  1 point or greater is considered a positive change.   Pulmonary Function Assessment:   Exercise Target Goals: Exercise Program Goal: Individual exercise prescription set using results from initial 6 min walk test and THRR while considering  patient's activity barriers and safety.   Exercise Prescription Goal: Initial exercise prescription builds to 30-45 minutes a day of aerobic activity, 2-3 days per week.  Home exercise guidelines will be given to patient during program as part of exercise prescription that the participant will acknowledge.  Activity Barriers & Risk Stratification:   6 Minute Walk:  Oxygen  Initial Assessment:   Oxygen Re-Evaluation:   Oxygen Discharge (Final Oxygen Re-Evaluation):   Initial Exercise Prescription:   Perform Capillary Blood Glucose checks as needed.  Exercise Prescription Changes: Exercise Prescription Changes    Row Name 03/18/19 1500             Response to Exercise   Blood Pressure (Admit)  120/72       Blood Pressure (Exit)  116/68       Heart Rate (Admit)  74 bpm       Heart Rate (Exercise)  87 bpm       Heart Rate (Exit)  71 bpm       Oxygen Saturation (Admit)  98 %       Oxygen Saturation (Exercise)  95 %       Oxygen Saturation (Exit)  98 %       Rating of Perceived Exertion (Exercise)  13       Perceived Dyspnea (Exercise)  1       Symptoms  none       Duration  Continue with 45 min of aerobic exercise without signs/symptoms of physical distress.       Intensity  THRR unchanged         Progression   Progression  Continue to progress workloads to maintain intensity without signs/symptoms of physical distress.       Average METs  3.27         Resistance Training   Training Prescription  Yes       Weight  3 lbs       Reps  10-15         Interval Training   Interval Training  No         Treadmill   MPH  3       Grade  1.5       Minutes  15       METs  3.92         NuStep   Level  3       Minutes  15       METs  2         REL-XR   Level  3       Minutes  15       METs  3.9         Home Exercise Plan   Plans to continue exercise at  Home (comment) walking       Frequency  Add 2 additional days to program exercise sessions.       Initial Home Exercises Provided  01/21/19          Exercise Comments:   Exercise Goals and Review:   Exercise Goals Re-Evaluation : Exercise Goals Re-Evaluation    Row Name 03/18/19 1520             Exercise Goal Re-Evaluation  Exercise Goals Review  Increase Physical Activity;Increase Strength and Stamina;Understanding of Exercise Prescription       Comments  Elizabeth [name] has  been doing well in rehab.  She continues to make some improvements and the XR is still her hardest piece.  She should be walking at home and has access to our videos with email.  We will continue to monitor her progress.        Expected Outcomes  Short: Walk at home.  Long; Continue to maintain her activity levels.           Discharge Exercise Prescription (Final Exercise Prescription Changes): Exercise Prescription Changes - 03/18/19 1500      Response to Exercise   Blood Pressure (Admit)  120/72    Blood Pressure (Exit)  116/68    Heart Rate (Admit)  74 bpm    Heart Rate (Exercise)  87 bpm    Heart Rate (Exit)  71 bpm    Oxygen Saturation (Admit)  98 %    Oxygen Saturation (Exercise)  95 %    Oxygen Saturation (Exit)  98 %    Rating of Perceived Exertion (Exercise)  13    Perceived Dyspnea (Exercise)  1    Symptoms  none    Duration  Continue with 45 min of aerobic exercise without signs/symptoms of physical distress.    Intensity  THRR unchanged      Progression   Progression  Continue to progress workloads to maintain intensity without signs/symptoms of physical distress.    Average METs  3.27      Resistance Training   Training Prescription  Yes    Weight  3 lbs    Reps  10-15      Interval Training   Interval Training  No      Treadmill   MPH  3    Grade  1.5    Minutes  15    METs  3.92      NuStep   Level  3    Minutes  15    METs  2      REL-XR   Level  3    Minutes  15    METs  3.9      Home Exercise Plan   Plans to continue exercise at  Home (comment)   walking   Frequency  Add 2 additional days to program exercise sessions.    Initial Home Exercises Provided  01/21/19       Nutrition:  Target Goals: Understanding of nutrition guidelines, daily intake of sodium <1563m, cholesterol <2050m calories 30% from fat and 7% or less from saturated fats, daily to have 5 or more servings of fruits and vegetables.  Biometrics:    Nutrition Therapy  Plan and Nutrition Goals:   Nutrition Assessments:   Nutrition Goals Re-Evaluation:   Nutrition Goals Discharge (Final Nutrition Goals Re-Evaluation):   Psychosocial: Target Goals: Acknowledge presence or absence of significant depression and/or stress, maximize coping skills, provide positive support system. Participant is able to verbalize types and ability to use techniques and skills needed for reducing stress and depression.   Initial Review & Psychosocial Screening:   Quality of Life Scores:  Scores of 19 and below usually indicate a poorer quality of life in these areas.  A difference of  2-3 points is a clinically meaningful difference.  A difference of 2-3 points in the total score of the Quality of Life Index has been associated with significant improvement in overall  quality of life, self-image, physical symptoms, and general health in studies assessing change in quality of life.  PHQ-9: Recent Review Flowsheet Data    Depression screen [facility] 2/9 01/06/2019 05/28/2018 05/27/2017 09/28/2016 07/19/2015   Decreased Interest 1 0 0 0 0   Down, Depressed, Hopeless 0 0 0 0 0   PHQ - 2 Score 1 0 0 0 0   Altered sleeping 1 - - - -   Tired, decreased energy 1 - - - -   Change in appetite 0 - - - -   Feeling bad or failure about yourself  0 - - - -   Trouble concentrating 0 - - - -   Moving slowly or fidgety/restless 0 - - - -   Suicidal thoughts 0 - - - -   PHQ-9 Score 3 - - - -   Difficult doing work/chores Not difficult at all - - - -     Interpretation of Total Score  Total Score Depression Severity:  1-4 = Minimal depression, 5-9 = Mild depression, 10-14 = Moderate depression, 15-19 = Moderately severe depression, 20-27 = Severe depression   Psychosocial Evaluation and Intervention:   Psychosocial Re-Evaluation:   Psychosocial Discharge (Final Psychosocial Re-Evaluation):   Education: Education Goals: Education classes will be provided on a weekly basis, covering  required topics. Participant will state understanding/return demonstration of topics presented.  Learning Barriers/Preferences:   Education Topics:  Initial Evaluation Education: - Verbal, written and demonstration of respiratory meds, oximetry and breathing techniques. Instruction on use of nebulizers and MDIs and importance of monitoring MDI activations.   Pulmonary Rehab from 03/04/2019 in [name] Cardiac and Pulmonary Rehab  Date  01/06/19  Educator  [facility]  Instruction Review Code  1- Verbalizes Understanding      General Nutrition Guidelines/Fats and Fiber: -Group instruction provided by verbal, written material, models and posters to present the general guidelines for heart healthy nutrition. Gives an explanation and review of dietary fats and fiber.   Pulmonary Rehab from 03/04/2019 in [facility] Cardiac and Pulmonary Rehab  Date  02/04/19  Educator  [facility]  Instruction Review Code  1- Verbalizes Understanding      Controlling Sodium/Reading Food Labels: -Group verbal and written material supporting the discussion of sodium use in heart healthy nutrition. Review and explanation with models, verbal and written materials for utilization of the food label.   Exercise Physiology & General Exercise Guidelines: - Group verbal and written instruction with models to review the exercise physiology of the cardiovascular system and associated critical values. Provides general exercise guidelines with specific guidelines to those with heart or lung disease.    Pulmonary Rehab from 03/04/2019 in [facility] Cardiac and Pulmonary Rehab  Date  01/14/19  Educator  [facility]  Instruction Review Code  1- Verbalizes Understanding      Aerobic Exercise & Resistance Training: - Gives group verbal and written instruction on the various components of exercise. Focuses on aerobic and resistive training programs and the benefits of this training and how to safely progress through these programs.   Pulmonary Rehab from  03/04/2019 in [facility] Cardiac and Pulmonary Rehab  Date  01/16/19  Educator  [facility]  Instruction Review Code  1- Verbalizes Understanding      Flexibility, Balance, Mind/Body Relaxation: Provides group verbal/written instruction on the benefits of flexibility and balance training, including mind/body exercise modes such as yoga, pilates and tai chi.  Demonstration and skill practice provided.   Pulmonary Rehab from 03/04/2019 in [facility] Cardiac and  Pulmonary Rehab  Date  01/21/19  Educator  [facility]  Instruction Review Code  1- Verbalizes Understanding      Stress and Anxiety: - Provides group verbal and written instruction about the health risks of elevated stress and causes of high stress.  Discuss the correlation between heart/lung disease and anxiety and treatment options. Review healthy ways to manage with stress and anxiety.   Pulmonary Rehab from 03/04/2019 in [facility] Cardiac and Pulmonary Rehab  Date  01/28/19  Educator  [facility]  Instruction Review Code  1- Verbalizes Understanding      Depression: - Provides group verbal and written instruction on the correlation between heart/lung disease and depressed mood, treatment options, and the stigmas associated with seeking treatment.   Pulmonary Rehab from 03/04/2019 in [facility] Cardiac and Pulmonary Rehab  Date  02/25/19  Educator  [place]  Instruction Review Code  1- Verbalizes Understanding      Exercise & Equipment Safety: - Individual verbal instruction and demonstration of equipment use and safety with use of the equipment.   Pulmonary Rehab from 03/04/2019 in [facility] Cardiac and Pulmonary Rehab  Date  01/06/19  Educator  [facility]  Instruction Review Code  1- Verbalizes Understanding      Infection Prevention: - Provides verbal and written material to individual with discussion of infection control including proper hand washing and proper equipment cleaning during exercise session.   Pulmonary Rehab from 03/04/2019 in [facility] Cardiac and Pulmonary Rehab  Date   01/06/19  Educator  [facility]  Instruction Review Code  1- Verbalizes Understanding      Falls Prevention: - Provides verbal and written material to individual with discussion of falls prevention and safety.   Pulmonary Rehab from 03/04/2019 in [facility] Cardiac and Pulmonary Rehab  Date  01/06/19  Educator  [facility]  Instruction Review Code  1- Verbalizes Understanding      Diabetes: - Individual verbal and written instruction to review signs/symptoms of diabetes, desired ranges of glucose level fasting, after meals and with exercise. Advice that pre and post exercise glucose checks will be done for 3 sessions at entry of program.   Chronic Lung Diseases: - Group verbal and written instruction to review updates, respiratory medications, advancements in procedures and treatments. Discuss use of supplemental oxygen including available portable oxygen systems, continuous and intermittent flow rates, concentrators, personal use and safety guidelines. Review proper use of inhaler and spacers. Provide informative websites for self-education.    Pulmonary Rehab from 03/04/2019 in [facility] Cardiac and Pulmonary Rehab  Date  01/30/19  Educator  [facility]  Instruction Review Code  1- Verbalizes Understanding      Energy Conservation: - Provide group verbal and written instruction for methods to conserve energy, plan and organize activities. Instruct on pacing techniques, use of adaptive equipment and posture/positioning to relieve shortness of breath.   Pulmonary Rehab from 03/04/2019 in [facility] Cardiac and Pulmonary Rehab  Date  02/27/19  Educator  [facility]  Instruction Review Code  1- Verbalizes Understanding      Triggers and Exacerbations: - Group verbal and written instruction to review types of environmental triggers and ways to prevent exacerbations. Discuss weather changes, air quality and the benefits of nasal washing. Review warning signs and symptoms to help prevent infections. Discuss techniques for effective airway  clearance, coughing, and vibrations.   AED/CPR: - Group verbal and written instruction with the use of models to demonstrate the basic use of the AED with the basic ABC's of resuscitation.   Anatomy and Physiology of  the Lungs: - Group verbal and written instruction with the use of models to provide basic lung anatomy and physiology related to function, structure and complications of lung disease.   Pulmonary Rehab from 03/04/2019 in [facility] Cardiac and Pulmonary Rehab  Date  03/04/19  Educator  [facility]  Instruction Review Code  1- Verbalizes Understanding      Anatomy & Physiology of the Heart: - Group verbal and written instruction and models provide basic cardiac anatomy and physiology, with the coronary electrical and arterial systems. Review of Valvular disease and Heart Failure   Cardiac Medications: - Group verbal and written instruction to review commonly prescribed medications for heart disease. Reviews the medication, class of the drug, and side effects.   Know Your Numbers and Risk Factors: -Group verbal and written instruction about important numbers in your health.  Discussion of what are risk factors and how they play a role in the disease process.  Review of Cholesterol, Blood Pressure, Diabetes, and BMI and the role they play in your overall health.   Pulmonary Rehab from 03/04/2019 in [facility] Cardiac and Pulmonary Rehab  Date  02/18/19  Educator  [facility]  Instruction Review Code  1- Verbalizes Understanding      Sleep Hygiene: -Provides group verbal and written instruction about how sleep can affect your health.  Define sleep hygiene, discuss sleep cycles and impact of sleep habits. Review good sleep hygiene tips.    Other: -Provides group and verbal instruction on various topics (see comments)    Knowledge Questionnaire Score:    Core Components/Risk Factors/Patient Goals at Admission:   Core Components/Risk Factors/Patient Goals Review:    Core Components/Risk  Factors/Patient Goals at Discharge (Final Review):    ITP Comments: ITP Comments    Row Name 03/16/19 0937 03/30/19 0945 09/09/19 1316       ITP Comments  Our program is currently closed due to COVID-19.  We are communicating with patient via phone calls and emails.  30 day review completed. ITP sent to Dr. Emily Filbert for review,changes as needed and signature. Continue with ITP unless changes directed by Dr. Sabra Heck.   Patient states she is worried abot COVID-19 to come back to [place]. Sent discharge ITP to Dr. Emily Filbert director of [place].        Comments: Discharge ITP

## 2019-09-09 NOTE — Progress Notes (Signed)
Discharge Progress Report  Patient Details  Name: Sherryll BurgerColleen C Astarita MRN: [id number] Date of Birth: 11/14/1927 Referring Provider:     Pulmonary Re[facility] Cardiac and Pulmonary Rehab  Referring Provider  Julien NordmannGollan, Timothy MD       Number of Visits: 19/36  Reason for Discharge:  Early Exit:  Personal  Smoking History:  Social History   Tobacco Use  Smoking Status Never Smoker  Smokeless Tobacco Never Used    Diagnosis:  Dyspnea on exertion  ADL UCSD:   Initial Exercise Prescription:   Discharge Exercise Prescription (Final Exercise Prescription Changes): Exercise Prescription Changes - 03/18/19 1500      Response to Exercise   Blood Pressure (Admit)  120/72    Blood Pressure (Exit)  116/68    Heart Rate (Admit)  74 bpm    Heart Rate (Exercise)  87 bpm    Heart Rate (Exit)  71 bpm    Oxygen Saturation (Admit)  98 %    Oxygen Saturation (Exercise)  95 %    Oxygen Saturation (Exit)  98 %    Rating of Perceived Exertion (Exercise)  13    Perceived Dyspnea (Exercise)  1    Symptoms  none    Duration  Continue with 45 min of aerobic exercise without signs/symptoms of physical distress.    Intensity  THRR unchanged      Progression   Progression  Continue to progress workloads to maintain intensity without signs/symptoms of physical distress.    Average METs  3.27      Resistance Training   Training Prescription  Yes    Weight  3 lbs    Reps  10-15      Interval Training   Interval Training  No      Treadmill   MPH  3    Grade  1.5    Minutes  15    METs  3.92      NuStep   Level  3    Minutes  15    METs  2      REL-XR   Level  3    Minutes  15    METs  3.9      Home Exercise Plan   Plans to continue exercise at  Home (comment)   walking   Frequency  Add 2 additional days to program exercise sessions.    Initial Home Exercises Provided  01/21/19       Functional Capacity:   Psychological, QOL, Others - Outcomes: PHQ  2/9: [facility] 01/06/2019 05/28/2018 05/27/2017 09/28/2016 07/19/2015  Decreased Interest 1 0 0 0 0  Down, Depressed, Hopeless 0 0 0 0 0  PHQ - 2 Score 1 0 0 0 0  Altered sleeping 1 - - - -  Tired, decreased energy 1 - - - -  Change in appetite 0 - - - -  Feeling bad or failure about yourself  0 - - - -  Trouble concentrating 0 - - - -  Moving slowly or fidgety/restless 0 - - - -  Suicidal thoughts 0 - - - -  PHQ-9 Score 3 - - - -  Difficult doing work/chores Not difficult at all - - - -    Quality of Life:   Personal Goals: Goals established at orientation with interventions provided to work toward goal.    Personal Goals Discharge:   Exercise Goals and Review:   Exercise Goals Re-Evaluation: Exercise Goals Re-Evaluation  [place] Name 03/18/19 1520             Exercise Goal Re-Evaluation   Exercise Goals Review  Increase Physical Activity;Increase Strength and Stamina;Understanding of Exercise Prescription       Comments  Quintin Alto has been doing well in rehab.  She continues to make some improvements and the XR is still her hardest piece.  She should be walking at home and has access to our videos with email.  We will continue to monitor her progress.        Expected Outcomes  Short: Walk at home.  Long; Continue to maintain her activity levels.           Nutrition & Weight - Outcomes:    Nutrition:   Nutrition Discharge:   Education Questionnaire Score:   Goals reviewed with patient; copy given to patient.

## 2019-09-15 ENCOUNTER — Other Ambulatory Visit: Payer: Self-pay | Admitting: Physician Assistant

## 2019-09-16 ENCOUNTER — Ambulatory Visit (INDEPENDENT_AMBULATORY_CARE_PROVIDER_SITE_OTHER): Payer: Medicare Other

## 2019-09-16 ENCOUNTER — Other Ambulatory Visit: Payer: Self-pay

## 2019-09-16 DIAGNOSIS — Z0000 Encounter for general adult medical examination without abnormal findings: Secondary | ICD-10-CM | POA: Diagnosis not present

## 2019-09-16 NOTE — Patient Instructions (Addendum)
  [name] , Thank you for taking time to come for your Medicare Wellness Visit. I appreciate your ongoing commitment to your health goals. Please review the following plan we discussed and let me know if I can assist you in the future.   These are the goals we discussed: Goals    . Increase water intake     Stay hydrated       This is a list of the screening recommended for you and due dates:  Health Maintenance  Topic Date Due  . Flu Shot  03/23/2020*  . DEXA scan (bone density measurement)  09/15/2020*  . Tetanus Vaccine  09/15/2020*  . Pneumonia vaccines  Completed  . Mammogram  Discontinued  *Topic was postponed. The date shown is not the original due date.

## 2019-09-16 NOTE — Progress Notes (Addendum)
Subjective:   Elizabeth [name] is a 85 y.o. female who presents for Medicare Annual (Subsequent) preventive examination.  Review of Systems:  No ROS.  Medicare Wellness Virtual Visit.  Visual/audio telehealth visit, UTA vital signs.   See social history for additional risk factors.   Cardiac Risk Factors include: advanced age (>40men, >31 women);hypertension     Objective:     Vitals: There were no vitals taken for this visit.  There is no height or weight on file to calculate BMI.  Advanced Directives 09/16/2019 07/31/2019 01/06/2019 05/28/2018 05/27/2017  Does Patient Have a Medical Advance Directive? No No Yes Yes No  Type of Advance Directive - Scientist, water quality of Elizabeth [name];Living will -  Does patient want to make changes to medical advance directive? - - Yes (Inpatient - patient requests chaplain consult to change a medical advance directive) No - Patient declined Yes (MAU/Ambulatory/Procedural Areas - Information given)  Copy of Elizabeth [name] in Chart? - - - No - copy requested -  Would patient like information on creating a medical advance directive? No - Patient declined - - - -    Tobacco Social History   Tobacco Use  Smoking Status Never Smoker  Smokeless Tobacco Never Used     Counseling given: Not Answered   Clinical Intake:  Pre-visit preparation completed: Yes           How often do you need to have someone help you when you read instructions, pamphlets, or other written materials from your doctor or pharmacy?: 1 - Never  Interpreter Needed?: No     Past Medical History:  Diagnosis Date  . Cystocele or rectocele with incomplete uterine prolapse   . Hyperlipidemia   . Hypertension   . Irritable bowel syndrome   . Palpitations   . Vaginal atrophy    Past Surgical History:  Procedure Laterality Date  . CATARACT EXTRACTION  2006   Family History  Problem Relation Age of Onset  . Transient ischemic  attack Mother        multiple   . Dementia Mother   . Stroke Father   . Heart disease Brother        MI - died age 58  . Leukemia Brother   . Lung cancer Brother   . Throat cancer Brother        had heart disease also  . Heart disease Sister   . Dementia Sister    Social History   Socioeconomic History  . Marital status: Widowed    Spouse name: Not on file  . Number of children: 2  . Years of education: Not on file  . Highest education level: Not on file  Occupational History  . Not on file  Social Needs  . Financial resource strain: Not hard at all  . Food insecurity    Worry: Never true    Inability: Never true  . Transportation needs    Medical: No    Non-medical: No  Tobacco Use  . Smoking status: Never Smoker  . Smokeless tobacco: Never Used  Substance and Sexual Activity  . Alcohol use: No    Alcohol/week: 0.0 standard drinks  . Drug use: No  . Sexual activity: Not Currently    Birth control/protection: None  Lifestyle  . Physical activity    Days per week: 7 days    Minutes per session: 30 min  . Stress: Not at all  Relationships  .  Social Musician on phone: Not on file    Gets together: Not on file    Attends religious service: Not on file    Active member of club or organization: Not on file    Attends meetings of clubs or organizations: Not on file    Relationship status: Widowed  Other Topics Concern  . Not on file  Social History Narrative  . Not on file    Outpatient Encounter Medications as of 09/16/2019  Medication Sig  . ACCUPRIL 40 MG tablet TAKE 1 TABLET BY MOUTH ONCE DAILY  . aspirin 81 MG tablet Take 81 mg by mouth daily.  . cetirizine (ZYRTEC) 5 MG tablet Take 5 mg by mouth daily as needed.   Marland Kitchen estradiol (ESTRACE) 0.1 MG/GM vaginal cream PLACE 1 APPLICATORFUL VAGINALLY 2 TIMES A WEEK (Patient taking differently: Place 1 Applicatorful vaginally once a week. )  . LIPITOR 40 MG tablet TAKE 1 TABLET BY MOUTH ONCE DAILY  .  magnesium oxide (MAG-OX) 400 (241.3 Mg) MG tablet TAKE 1 TABLET BY MOUTH ONCE DAILY  . metoprolol tartrate (LOPRESSOR) 25 MG tablet TAKE 1 TABLET BY MOUTH TWICE DAILY  . triamcinolone (NASACORT) 55 MCG/ACT nasal inhaler Place 2 sprays into the nose daily as needed.    No facility-administered encounter medications on file as of 09/16/2019.     Activities of Daily Living In your present state of health, do you have any difficulty performing the following activities: 09/16/2019  Hearing? Y  Comment Hearing aids  Vision? N  Difficulty concentrating or making decisions? N  Walking or climbing stairs? N  Dressing or bathing? N  Doing errands, shopping? N  Preparing Food and eating ? N  Using the Toilet? N  In the past six months, have you accidently leaked urine? Y  Comment Managed with daily liner  Do you have problems with loss of bowel control? N  Managing your Medications? N  Managing your Finances? N  Housekeeping or managing your Housekeeping? N  Some recent data might be hidden    Patient Care Team: Dale Pho[place], MD as PCP - General (Internal Medicine)    Assessment:   This is a routine wellness examination for Elizabeth [name].  I connected with patient 09/16/19 at 12:00 PM EDT by an audio enabled telemedicine application and verified that I am speaking with the correct person using two identifiers. Patient stated full name and DOB. Patient gave permission to continue with virtual visit. Patient's location was at home and Nurse's location was at Elizabeth [name] office.   Health Maintenance Due: -Influenza vaccine 2020- discussed; to be completed in season with doctor or local pharmacy.   -Tdap- discussed; to be completed with doctor in visit or local pharmacy.   Update all pending maintenance due as appropriate.   See completed HM at the end of note.   Eye: Visual acuity not assessed. Virtual visit. Wears corrective lenses.    Dental: UTD   Hearing: Hearing aids- yes  Safety:   Patient feels safe at home- yes Patient does have smoke detectors at home- yes Patient does wear sunscreen or protective clothing when in direct sunlight - yes Patient does wear seat belt when in a moving vehicle - yes Patient drives- yes Adequate lighting in walkways free from debris- yes Grab bars and handrails used as appropriate- yes Ambulates with no assistive device Cell phone or lifeline/life alert/medic alert on person when ambulating outside of the home- yes  Social: Alcohol intake - no  Smoking history- never  Smokers in home? none Illicit drug use? none  Depression: PHQ 2 &9 complete. See screening below. Denies irritability, anhedonia, sadness/tearfullness.  Stable.   Falls: See screening below.    Medication: Taking as directed and without issues.   Covid-19: Precautions and sickness symptoms discussed. Wears mask, social distancing, hand hygiene as appropriate.   Activities of Daily Living Patient denies needing assistance with: household chores, feeding themselves, getting from bed to chair, getting to the toilet, bathing/showering, dressing, managing money, or preparing meals.   Memory: Patient is alert. Patient denies difficulty focusing or concentrating. Correctly identified the president of the [place], season and recall. Patient likes to read and paint for brain stimulation.  BMI- discussed the importance of a healthy diet, water intake and the benefits of aerobic exercise.  Educational material provided.  Physical activity- pulmonary rehab exercises, stretches, treadmill walking 30 minutes daily.  Diet:  Regular; monitors  Water: minimal intake; encouraged to stay hydrated Caffeine: no  Advanced Directive: End of life planning; Advanced aging; Advanced directives discussed.  No HCPOA/Living Will.  Additional information declined at this time.  Other Providers Patient Care Team: Dale [place], MD as PCP - General (Internal Medicine)  Exercise  Activities and Dietary recommendations Current Exercise Habits: Home exercise routine, Type of exercise: treadmill;stretching, Time (Minutes): 30, Frequency (Times/Week): 7, Weekly Exercise (Minutes/Week): 210, Intensity: Mild  Goals    . Increase water intake     Stay hydrated       Fall Risk Fall Risk  09/16/2019 06/25/2019 05/28/2018 05/27/2017 09/28/2016  Falls in the past year? 0 0 No No No   Timed Get Up and Go performed: no, virtual visit  Depression Screen PHQ 2/9 Scores 09/16/2019 01/06/2019 05/28/2018 05/27/2017  PHQ - 2 Score 0 1 0 0  PHQ- 9 Score - 3 - -     Cognitive Function MMSE - Mini Mental State Exam 05/28/2018  Orientation to time 5  Orientation to Place 5  Registration 3  Attention/ Calculation 5  Recall 3  Language- name 2 objects 2  Language- repeat 1  Language- follow 3 step command 3  Language- read & follow direction 1  Write a sentence 1  Copy design 1  Total score 30     6CIT Screen 09/16/2019 05/27/2017  What Year? 0 points 0 points  What month? 0 points 0 points  What time? 0 points 0 points  Count back from 20 0 points 0 points  Months in reverse 0 points 0 points  Repeat phrase 0 points 0 points  Total Score 0 0    Immunization History  Administered Date(s) Administered  . Influenza Split 10/19/2009, 10/13/2013, 09/30/2014  . Influenza, High Dose Seasonal PF 09/28/2016, 10/30/2017, 11/04/2018  . Influenza-Unspecified 10/24/2015  . Pneumococcal Conjugate-13 03/11/2014  . Pneumococcal Polysaccharide-23 05/31/2017   Screening Tests Health Maintenance  Topic Date Due  . INFLUENZA VACCINE  03/23/2020 (Originally 07/25/2019)  . DEXA SCAN  09/15/2020 (Originally 01/26/1994)  . TETANUS/TDAP  09/15/2020 (Originally [birth year]/27/1948)  . PNA vac Low Risk Adult  Completed  . MAMMOGRAM  Discontinued      Plan:   Keep all routine maintenance appointments.   Follow up 10/15/19   Medicare Attestation I have personally reviewed: The patient's medical and social  history Their use of alcohol, tobacco or illicit drugs Their current medications and supplements The patient's functional ability including ADLs,fall risks, home safety risks, cognitive, and hearing and visual impairment Diet and physical activities Evidence for depression  In addition, I have reviewed and discussed with patient certain preventive protocols, quality metrics, and best practice recommendations. A written personalized care plan for preventive services as well as general preventive health recommendations were provided to patient via mail.     Ashok PallOBrien-Blaney, Serayah Yazdani L, LPN  1/61/09609/23/2020   Reviewed above information.  Agree with assessment and plan.    Dr Lorin PicketScott

## 2019-10-13 ENCOUNTER — Other Ambulatory Visit: Payer: Self-pay

## 2019-10-15 ENCOUNTER — Ambulatory Visit (INDEPENDENT_AMBULATORY_CARE_PROVIDER_SITE_OTHER): Payer: Medicare Other | Admitting: Internal Medicine

## 2019-10-15 ENCOUNTER — Other Ambulatory Visit: Payer: Self-pay

## 2019-10-15 DIAGNOSIS — N812 Incomplete uterovaginal prolapse: Secondary | ICD-10-CM

## 2019-10-15 DIAGNOSIS — K219 Gastro-esophageal reflux disease without esophagitis: Secondary | ICD-10-CM | POA: Diagnosis not present

## 2019-10-15 DIAGNOSIS — I471 Supraventricular tachycardia: Secondary | ICD-10-CM

## 2019-10-15 DIAGNOSIS — Z9109 Other allergy status, other than to drugs and biological substances: Secondary | ICD-10-CM

## 2019-10-15 DIAGNOSIS — Z23 Encounter for immunization: Secondary | ICD-10-CM | POA: Diagnosis not present

## 2019-10-15 DIAGNOSIS — F439 Reaction to severe stress, unspecified: Secondary | ICD-10-CM

## 2019-10-15 DIAGNOSIS — E7800 Pure hypercholesterolemia, unspecified: Secondary | ICD-10-CM

## 2019-10-15 DIAGNOSIS — R42 Dizziness and giddiness: Secondary | ICD-10-CM | POA: Diagnosis not present

## 2019-10-15 DIAGNOSIS — I10 Essential (primary) hypertension: Secondary | ICD-10-CM

## 2019-10-15 NOTE — Progress Notes (Signed)
Patient ID: Elizabeth [name], female   DOB: [birth year], 85 y.o.   MRN: [id number]   Subjective:    Patient ID: Elizabeth [name], female    DOB: 01-29-1927, 85 y.o.   MRN: [id number]  HPI  Patient here for a scheduled follow up.  She reports she is doing relatively well. Still with increased stress.  Still adjusting to her husband's death.  Not going out as much secondary to covid restrictions.  Increased stress related to this.  Is followed by cardiology for SVT - on metoprolol.  Still some increased heart rate at times.  Request f/u with cardiology.   Still exercises some, but not as much.  No chest pain.  Breathing stable.  No acid reflux.  No abdominal pain.  Bowels moving.     Past Medical History:  Diagnosis Date  . Cystocele or rectocele with incomplete uterine prolapse   . Hyperlipidemia   . Hypertension   . Irritable bowel syndrome   . Palpitations   . Vaginal atrophy    Past Surgical History:  Procedure Laterality Date  . CATARACT EXTRACTION  2006   Family History  Problem Relation Age of Onset  . Transient ischemic attack Mother        multiple   . Dementia Mother   . Stroke Father   . Heart disease Brother        MI - died age 53  . Leukemia Brother   . Lung cancer Brother   . Throat cancer Brother        had heart disease also  . Heart disease Sister   . Dementia Sister    Social History   Socioeconomic History  . Marital status: Widowed    Spouse name: Not on file  . Number of children: 2  . Years of education: Not on file  . Highest education level: Not on file  Occupational History  . Not on file  Social Needs  . Financial resource strain: Not hard at all  . Food insecurity    Worry: Never true    Inability: Never true  . Transportation needs    Medical: No    Non-medical: No  Tobacco Use  . Smoking status: Never Smoker  . Smokeless tobacco: Never Used  Substance and Sexual Activity  . Alcohol use: No    Alcohol/week: 0.0 standard drinks  . Drug  use: No  . Sexual activity: Not Currently    Birth control/protection: None  Lifestyle  . Physical activity    Days per week: 7 days    Minutes per session: 30 min  . Stress: Not at all  Relationships  . Social Herbalist on phone: Not on file    Gets together: Not on file    Attends religious service: Not on file    Active member of club or organization: Not on file    Attends meetings of clubs or organizations: Not on file    Relationship status: Widowed  Other Topics Concern  . Not on file  Social History Narrative  . Not on file    Outpatient Encounter Medications as of 10/15/2019  Medication Sig  . ACCUPRIL 40 MG tablet TAKE 1 TABLET BY MOUTH ONCE DAILY  . aspirin 81 MG tablet Take 81 mg by mouth daily.  . cetirizine (ZYRTEC) 5 MG tablet Take 5 mg by mouth daily as needed.   Marland Kitchen estradiol (ESTRACE) 0.1 MG/GM vaginal cream PLACE 1 APPLICATORFUL VAGINALLY 2 TIMES  A WEEK (Patient taking differently: Place 1 Applicatorful vaginally once a week. )  . LIPITOR 40 MG tablet TAKE 1 TABLET BY MOUTH ONCE DAILY  . magnesium oxide (MAG-OX) 400 (241.3 Mg) MG tablet TAKE 1 TABLET BY MOUTH ONCE DAILY  . metoprolol tartrate (LOPRESSOR) 25 MG tablet TAKE 1 TABLET BY MOUTH TWICE DAILY  . triamcinolone (NASACORT) 55 MCG/ACT nasal inhaler Place 2 sprays into the nose daily as needed.    No facility-administered encounter medications on file as of 10/15/2019.     Review of Systems  Constitutional: Negative for appetite change and unexpected weight change.  HENT: Negative for congestion and sinus pressure.   Respiratory: Negative for cough, chest tightness and shortness of breath.   Cardiovascular: Negative for chest pain and leg swelling.  Gastrointestinal: Negative for abdominal pain, diarrhea, nausea and vomiting.  Genitourinary: Negative for difficulty urinating and dysuria.  Musculoskeletal: Negative for joint swelling and myalgias.  Skin: Negative for color change and rash.   Neurological: Negative for dizziness, light-headedness and headaches.  Psychiatric/Behavioral: Negative for agitation and dysphoric mood.       Objective:    Physical Exam Constitutional:      General: She is not in acute distress.    Appearance: Normal appearance.  HENT:     Right Ear: External ear normal.     Left Ear: External ear normal.  Eyes:     General: No scleral icterus.       Right eye: No discharge.        Left eye: No discharge.     Conjunctiva/sclera: Conjunctivae normal.  Neck:     Musculoskeletal: Neck supple. No muscular tenderness.     Thyroid: No thyromegaly.  Cardiovascular:     Rate and Rhythm: Normal rate and regular rhythm.  Pulmonary:     Effort: No respiratory distress.     Breath sounds: Normal breath sounds. No wheezing.  Abdominal:     General: Bowel sounds are normal.     Palpations: Abdomen is soft.     Tenderness: There is no abdominal tenderness.  Musculoskeletal:        General: No swelling or tenderness.  Lymphadenopathy:     Cervical: No cervical adenopathy.  Skin:    Findings: No erythema or rash.  Neurological:     Mental Status: She is alert.  Psychiatric:        Mood and Affect: Mood normal.        Behavior: Behavior normal.     BP (!) 146/70   Pulse 68   Temp (!) 95.3 F (35.2 C)   Resp 16   Ht 5' 1.5" (1.562 m)   Wt 138 lb (62.6 kg)   SpO2 98%   BMI 25.65 kg/m  Wt Readings from Last 3 Encounters:  10/15/19 138 lb (62.6 kg)  08/11/19 137 lb 11.2 oz (62.5 kg)  06/16/19 138 lb (62.6 kg)     Lab Results  Component Value Date   WBC 7.5 07/31/2019   HGB 14.2 07/31/2019   HCT 42.8 07/31/2019   PLT 256 07/31/2019   GLUCOSE 102 (H) 07/31/2019   CHOL 151 07/16/2019   TRIG 104.0 07/16/2019   HDL 48.90 07/16/2019   LDLDIRECT 178.4 11/04/2013   LDLCALC 81 07/16/2019   ALT 24 07/31/2019   AST 23 07/31/2019   NA 135 07/31/2019   K 4.5 07/31/2019   CL 103 07/31/2019   CREATININE 1.09 (H) 07/31/2019   BUN 23  07/31/2019   CO2  23 07/31/2019   TSH 3.44 07/16/2019    Dg Chest 2 View  Result Date: 07/31/2019 CLINICAL DATA:  [age] year old female with history of pain under the left shoulder blade radiating into the left arm for the past 2 days. EXAM: CHEST - 2 VIEW COMPARISON:  Chest x-ray 11/04/2018. FINDINGS: Lung volumes are normal. No consolidative airspace disease. No pleural effusions. No pneumothorax. No pulmonary nodule or mass noted. Pulmonary vasculature and the cardiomediastinal silhouette are within normal limits. Atherosclerosis in the thoracic aorta. IMPRESSION: 1.  No radiographic evidence of acute cardiopulmonary disease. 2. Aortic atherosclerosis. Electronically Signed   By: Trudie Reedaniel  Entrikin M.D.   On: 07/31/2019 12:13       Assessment & Plan:   Problem List Items Addressed This Visit    Dizziness    Has seen ENT.  Has a history of vertigo.  Has seen cardiology.  Diagnosed with SVT.  On metoprolol.  Overall feels stable.  Has declined neurology evaluation.  Follow.        Environmental allergies    Reasonably controlled with zyrtec.        First degree uterine prolapse    Followed by Dr Valentino Saxonherry. Pessary.       GERD (gastroesophageal reflux disease)    Controlled.        Hypercholesterolemia    On lipitor.  Low cholesterol diet and exercise.  Follow lipid panel and liver function tests.        Relevant Orders   Hepatic function panel   Lipid panel   Hypertension    Blood pressure as outlined.  Continue same medication regimen.  Follow pressures.  Follow metabolic panel.       Relevant Orders   Basic metabolic panel   Stress    Increased stress as outlined.  Discussed with her today.  She does not feel needs anything more at this time.  Follow.        SVT (supraventricular tachycardia) (HCC)    On metoprolol.  Sees cardiology.  Appears to be improved.  Does request referral back to cardiolofy for f/u.        Relevant Orders   Ambulatory referral to Cardiology     Other Visit Diagnoses    Need for immunization against influenza       Relevant Orders   Flu Vaccine QUAD High Dose(Fluad) (Completed)       Dale Durhamharlene Kingsley Herandez, MD

## 2019-10-18 ENCOUNTER — Encounter: Payer: Self-pay | Admitting: Internal Medicine

## 2019-10-18 NOTE — Assessment & Plan Note (Signed)
Increased stress as outlined.  Discussed with her today.  She does not feel needs anything more at this time.  Follow.   

## 2019-10-18 NOTE — Assessment & Plan Note (Signed)
Followed by Dr Marcelline Mates. Pessary.

## 2019-10-18 NOTE — Assessment & Plan Note (Signed)
Has seen ENT.  Has a history of vertigo.  Has seen cardiology.  Diagnosed with SVT.  On metoprolol.  Overall feels stable.  Has declined neurology evaluation.  Follow.

## 2019-10-18 NOTE — Assessment & Plan Note (Signed)
On metoprolol.  Sees cardiology.  Appears to be improved.  Does request referral back to cardiolofy for f/u.

## 2019-10-18 NOTE — Assessment & Plan Note (Signed)
On lipitor.  Low cholesterol diet and exercise.  Follow lipid panel and liver function tests.   

## 2019-10-18 NOTE — Assessment & Plan Note (Signed)
Reasonably controlled with zyrtec.

## 2019-10-18 NOTE — Assessment & Plan Note (Signed)
Blood pressure as outlined.  Continue same medication regimen.  Follow pressures.  Follow metabolic panel.  

## 2019-10-18 NOTE — Assessment & Plan Note (Signed)
Controlled.  

## 2019-10-22 ENCOUNTER — Ambulatory Visit: Payer: Medicare Other | Admitting: Cardiology

## 2019-10-22 NOTE — Progress Notes (Signed)
Cardiology Office Note    Date:  10/26/2019   ID:  Elizabeth HaymakerColleen C [name], DOB 09/03/1927, MRN [id number]  PCP:  Elizabeth DurhamScott, Charlene, MD  Cardiologist:  Elizabeth Nordmannimothy Gollan, MD  Electrophysiologist:  None   Chief Complaint: Follow up  History of Present Illness:   Elizabeth [name] is a 85 y.o. female with history of SVT, HTN, HLD, hyponatremia, vertigo, and IBS who presents for follow up of SVT.   Patient has previously reported undergoing stress testing that was normal, an echo for murmur that showed valvular calcification, and a Holter that was unrevealing. She has reported a long history of palpitations for > 20 years and was previously maintained on propranolol. She was seen in 10/2018 for evaluation of a 12 month history of SOB that occurred randomly and was described as a need to take a couple deep breaths and was asymptomatic with exertion. She also noted her palpitations were more noticeable. She had been more sedentary following the death of her husband. Echo in 11/2018 showed an EF of 60-65%, normal LV diastolic function, trivial AI, mild MR, left atrium normal in size, RVSF normal, PASP 33 mmHg. Outpatient cardiac monitoring showed an overall sinus rhythm with an average heart rate of 77 bpm. There were 59 atrial tach/SVT runs with the fastest interval lasting 5 beats with a max rate of 226 bpm and the longest interval lasting 16 beats with an average rate of 108 bpm. There were some episodes of SVT conducted with possible aberrancy. SVT was detected with patient triggered events. In this setting, she was placed on scheduled Lopressor 25 mg bid and her prn propranolol was stopped. Labs including TSH and potassium were unrevealing.   I last saw her in 11/2018 for follow up of SVT/HTN at which time she was doing well and reported improved BP on metoprolol. She was walking on a treadmill without any SOB or chest pain daily for about 30 minutes. She preferred to defer ischemic testing at that time.   She was  seen in the ED in 07/2019 with chest pain with high sensitivity troponin of 4 with a delta of 5, CXR without acute process with aortic atherosclerosis noted, and EKG showing sinus rhythm with nonspecific changes. She was discharged with atypical chest pain.   Most recently, she was seen by PCP on 10/15/2019 for routine follow up. She continued to adjust following her husband's death. She noted increased stress in the setting of the loss of her husband and COVID restrictions. She noted some increased heart rates at times.   She comes in doing reasonably well from a cardiac perspective.  She does note a slight increase in palpitations which she describes as a "fluttering."  She states these episodes will occur 3-4 times per week and will last for several seconds to several minutes with spontaneous resolution.  She has not paid attention to see if there are any associated symptoms with these palpitations.  They do feel somewhat different than her prior long history of palpitations.  She has been compliant with metoprolol.  She continues to live a relatively sedentary lifestyle following the death of her husband and in the setting of the COVID-19 pandemic.  She walks on her treadmill for 30 minutes each day without any symptoms of chest pain, shortness of breath, palpitations, dizziness, presyncope, or syncope.  She notes her shortness of breath is typically only noted when she is sitting down reading a book or drinking coffee and is described as a rare  involuntary deep inhalation.  BP has been running between the 161W to 960A systolic.  She does frequently forget to take her Accupril.  She remains very anxious.   Labs: 07/2019 - potassium 4.5, BUN 23, SCr 1.09, albumin 4.5, AST/ALT normal, HGB 14.2, PLT 256 06/2019 - TSH normal, TC 151, TG 104, HDL 48, LDL 81   Past Medical History:  Diagnosis Date  . Cystocele or rectocele with incomplete uterine prolapse   . Hyperlipidemia   . Hypertension   . Irritable  bowel syndrome   . Palpitations   . Vaginal atrophy     Past Surgical History:  Procedure Laterality Date  . CATARACT EXTRACTION  2006    Current Medications: Current Meds  Medication Sig  . ACCUPRIL 40 MG tablet TAKE 1 TABLET BY MOUTH ONCE DAILY  . aspirin 81 MG tablet Take 81 mg by mouth daily.  . cetirizine (ZYRTEC) 5 MG tablet Take 5 mg by mouth daily as needed.   Marland Kitchen estradiol (ESTRACE) 0.1 MG/GM vaginal cream PLACE 1 APPLICATORFUL VAGINALLY 2 TIMES A WEEK (Patient taking differently: Place 1 Applicatorful vaginally once a week. )  . LIPITOR 40 MG tablet TAKE 1 TABLET BY MOUTH ONCE DAILY  . magnesium oxide (MAG-OX) 400 (241.3 Mg) MG tablet TAKE 1 TABLET BY MOUTH ONCE DAILY  . metoprolol tartrate (LOPRESSOR) 25 MG tablet TAKE 1 TABLET BY MOUTH TWICE DAILY  . triamcinolone (NASACORT) 55 MCG/ACT nasal inhaler Place 2 sprays into the nose daily as needed.     Allergies:   Patient has no known allergies.   Social History   Socioeconomic History  . Marital status: Widowed    Spouse name: Not on file  . Number of children: 2  . Years of education: Not on file  . Highest education level: Not on file  Occupational History  . Not on file  Social Needs  . Financial resource strain: Not hard at all  . Food insecurity    Worry: Never true    Inability: Never true  . Transportation needs    Medical: No    Non-medical: No  Tobacco Use  . Smoking status: Never Smoker  . Smokeless tobacco: Never Used  Substance and Sexual Activity  . Alcohol use: No    Alcohol/week: 0.0 standard drinks  . Drug use: No  . Sexual activity: Not Currently    Birth control/protection: None  Lifestyle  . Physical activity    Days per week: 7 days    Minutes per session: 30 min  . Stress: Not at all  Relationships  . Social Herbalist on phone: Not on file    Gets together: Not on file    Attends religious service: Not on file    Active member of club or organization: Not on file     Attends meetings of clubs or organizations: Not on file    Relationship status: Widowed  Other Topics Concern  . Not on file  Social History Narrative  . Not on file     Family History:  The patient's family history includes Dementia in her mother and sister; Heart disease in her brother and sister; Leukemia in her brother; Lung cancer in her brother; Stroke in her father; Throat cancer in her brother; Transient ischemic attack in her mother.  ROS:   Review of Systems  Constitutional: Positive for malaise/fatigue. Negative for chills, diaphoresis, fever and weight loss.  HENT: Negative for congestion.   Eyes: Negative for discharge  and redness.  Respiratory: Positive for shortness of breath. Negative for cough, hemoptysis, sputum production and wheezing.   Cardiovascular: Positive for palpitations. Negative for chest pain, orthopnea, claudication, leg swelling and PND.  Gastrointestinal: Negative for abdominal pain, blood in stool, heartburn, melena, nausea and vomiting.  Genitourinary: Negative for hematuria.  Musculoskeletal: Negative for falls and myalgias.  Skin: Negative for rash.  Neurological: Positive for weakness. Negative for dizziness, tingling, tremors, sensory change, speech change, focal weakness and loss of consciousness.  Endo/Heme/Allergies: Does not bruise/bleed easily.  Psychiatric/Behavioral: Negative for substance abuse. The patient is nervous/anxious.   All other systems reviewed and are negative.    EKGs/Labs/Other Studies Reviewed:    Studies reviewed were summarized above. The additional studies were reviewed today: As above.   EKG:  EKG is ordered today.  The EKG ordered today demonstrates NSR, 74 bpm, left axis deviation, LVH, cannot exclude prior septal infarct, no acute ST-T changes  Recent Labs: 11/13/2018: BNP 44.9 07/16/2019: TSH 3.44 07/31/2019: ALT 24; BUN 23; Creatinine, Ser 1.09; Hemoglobin 14.2; Platelets 256; Potassium 4.5; Sodium 135   Recent Lipid Panel    Component Value Date/Time   CHOL 151 07/16/2019 0857   TRIG 104.0 07/16/2019 0857   HDL 48.90 07/16/2019 0857   CHOLHDL 3 07/16/2019 0857   VLDL 20.8 07/16/2019 0857   LDLCALC 81 07/16/2019 0857   LDLDIRECT 178.4 11/04/2013 0821    PHYSICAL EXAM:    VS:  BP (!) 144/72 (BP Location: Left Arm, Patient Position: Sitting, Cuff Size: Normal)   Pulse 74   Ht  (1.549 m)   Wt 139 lb 8 oz (63.3 kg)   BMI 26.36 kg/m   BMI: Body mass index is 26.36 kg/m.  Physical Exam  Constitutional: She is oriented to person, place, and time. She appears well-developed and well-nourished.  HENT:  Head: Normocephalic and atraumatic.  Eyes: Right eye exhibits no discharge. Left eye exhibits no discharge.  Neck: Normal range of motion. No JVD present.  Cardiovascular: Normal rate, regular rhythm, S1 normal, S2 normal and normal heart sounds. Exam reveals no distant heart sounds, no friction rub, no midsystolic click and no opening snap.  No murmur heard. Pulses:      Posterior tibial pulses are 2+ on the right side and 2+ on the left side.  Pulmonary/Chest: Effort normal and breath sounds normal. No respiratory distress. She has no decreased breath sounds. She has no wheezes. She has no rales. She exhibits no tenderness.  Abdominal: Soft. She exhibits no distension. There is no abdominal tenderness.  Musculoskeletal:        General: No edema.  Neurological: She is alert and oriented to person, place, and time.  Skin: Skin is warm and dry. No cyanosis. Nails show no clubbing.  Psychiatric: She has a normal mood and affect. Her speech is normal and behavior is normal. Judgment and thought content normal.  Vitals reviewed.   Wt Readings from Last 3 Encounters:  10/26/19 139 lb 8 oz (63.3 kg)  10/15/19 138 lb (62.6 kg)  08/11/19 137 lb 11.2 oz (62.5 kg)     ASSESSMENT & PLAN:   1. Paroxysmal SVT/palpitations: Patient has noted a slight increase in tachypalpitations which  do feel somewhat different compared to her long history of palpitations.  She does report compliance with metoprolol and initially noted improvement in palpitations with a daily beta-blockade.  Given change in frequency and overall symptoms of palpitations we will repeat 14-day ZIO monitor to reevaluate.  Based  on outpatient cardiac monitoring results we may escalate her metoprolol versus transition to calcium channel blocker versus referral to EP for possible SVT ablation if there is a significant burden noted.  For now, continue Lopressor 25 mg twice daily.  Recent labs including potassium, hemoglobin, and TSH were unrevealing as outlined above.  2. Dyspnea: Overall unchanged when compared to most recent visit with continued notation of shortness of breath typically occurring at rest and described as a rare deep inhalation without any symptoms associated with exertion.  She continues to ambulate for 30 minutes/day on her treadmill without cardiac limitation.  Recent echo unrevealing as outlined above.  She has previously deferred ischemic testing.  Given lack of anginal symptoms with exertion we will start evaluation with outpatient cardiac monitoring as outlined above.  Pending these results we may need to repeat echo.  In follow-up, we could potentially revisit ischemic testing with a Lexiscan Myoview, which she prefers to defer at this time.  Symptoms appear to be related to increased sedentary lifestyle and overall anxiety.  3. HTN: Blood pressure is mildly elevated today.  Likely in the setting of her frequently missed doses of Accupril and increased stress/anxiety.  Plan to escalate metoprolol pending outpatient cardiac monitoring results as outlined above.  Recommend she start taking Accupril in the morning rather than around midday in an effort to improve compliance.  Continue current dose metoprolol.  4. HLD: LDL of 81 from 06/2019.  Remains on atorvastatin 40 mg daily.  Disposition: F/u with Dr.  Mariah Milling or an APP in 6 weeks.   Medication Adjustments/Labs and Tests Ordered: Current medicines are reviewed at length with the patient today.  Concerns regarding medicines are outlined above. Medication changes, Labs and Tests ordered today are summarized above and listed in the Patient Instructions accessible in Encounters.   Signed, Eula Listen, PA-C 10/26/2019 11:13 AM     [facility] [place] [address] [place], [place] [address] [phone]

## 2019-10-26 ENCOUNTER — Ambulatory Visit (INDEPENDENT_AMBULATORY_CARE_PROVIDER_SITE_OTHER): Payer: Medicare Other | Admitting: Physician Assistant

## 2019-10-26 ENCOUNTER — Encounter: Payer: Self-pay | Admitting: Physician Assistant

## 2019-10-26 ENCOUNTER — Ambulatory Visit (INDEPENDENT_AMBULATORY_CARE_PROVIDER_SITE_OTHER): Payer: Medicare Other

## 2019-10-26 ENCOUNTER — Other Ambulatory Visit: Payer: Self-pay

## 2019-10-26 VITALS — BP 144/72 | HR 74 | Ht 61.0 in | Wt 139.5 lb

## 2019-10-26 DIAGNOSIS — R0602 Shortness of breath: Secondary | ICD-10-CM

## 2019-10-26 DIAGNOSIS — E782 Mixed hyperlipidemia: Secondary | ICD-10-CM

## 2019-10-26 DIAGNOSIS — R002 Palpitations: Secondary | ICD-10-CM

## 2019-10-26 DIAGNOSIS — I10 Essential (primary) hypertension: Secondary | ICD-10-CM

## 2019-10-26 DIAGNOSIS — I471 Supraventricular tachycardia: Secondary | ICD-10-CM

## 2019-10-26 NOTE — Patient Instructions (Addendum)
Medication Instructions:  - Your physician recommends that you continue on your current medications as directed. Please refer to the Current Medication list given to you today.  *If you need a refill on your cardiac medications before your next appointment, please call your pharmacy*  Lab Work: - none ordered  If you have labs (blood work) drawn today and your tests are completely normal, you will receive your results only by: Marland Kitchen MyChart Message (if you have MyChart) OR . A paper copy in the mail If you have any lab test that is abnormal or we need to change your treatment, we will call you to review the results.  Testing/Procedures: - Your physician has recommended that you wear a 14 day Zio monitor. This monitor is a medical device that records the heart's electrical activity. Doctors most often use these monitors to diagnose arrhythmias. Arrhythmias are problems with the speed or rhythm of the heartbeat. The monitor is a small device applied to your chest. You can wear one while you do your normal daily activities. While wearing this monitor if you have any symptoms to push the button and record what you felt. Once you have worn this monitor for the period of time provider prescribed (Usually 14 days), you will return the monitor device in the postage paid box. Once it is returned they will download the data collected and provide [place] with a report which the provider will then review and we will call you with those results. Important tips:  1. Avoid showering during the first 24 hours of wearing the monitor. 2. Avoid excessive sweating to help maximize wear time. 3. Do not submerge the device, no hot tubs, and no swimming pools. 4. Keep any lotions or oils away from the patch. 5. After 24 hours you may shower with the patch on. Take brief showers with your back facing the shower head.  6. Do not remove patch once it has been placed because that will interrupt data and decrease adhesive wear  time. 7. Push the button when you have any symptoms and write down what you were feeling. 8. Once you have completed wearing your monitor, remove and place into box which has postage paid and place in your outgoing mailbox.  9. If for some reason you have misplaced your box then call our office and we can provide another box and/or mail it off for you.        Follow-Up: At [facility], you and your health needs are our priority.  As part of our continuing mission to provide you with exceptional heart care, we have created designated Provider Care Teams.  These Care Teams include your primary Cardiologist (physician) and Advanced Practice Providers (APPs -  Physician Assistants and Nurse Practitioners) who all work together to provide you with the care you need, when you need it.  Your next appointment:   6 weeks  The format for your next appointment:   In Person  Provider:    You may see Ida Rogue, MD or one of the following Advanced Practice Providers on your designated Care Team:    Murray Hodgkins, NP  Christell Faith, PA-C  Marrianne Mood, PA-C   Other Instructions - n/a

## 2019-12-07 ENCOUNTER — Telehealth: Payer: Self-pay

## 2019-12-07 NOTE — Telephone Encounter (Signed)
Call to patient to discuss monitor results. Pt verbalized understanding and reported that she had appt with Dr. Rockey Situ tomorrow. She will hold off making any med changes until then.   No further questions or orders at this time.   Advised pt to call for any further questions or concerns.

## 2019-12-07 NOTE — Telephone Encounter (Signed)
-----   Message from Rise Mu, PA-C sent at 12/07/2019  7:56 AM EST ----- Outpatient cardiac monitoring showed normal sinus rhythm with an average heart rate of 64 bpm, 61 runs of SVT/atrial tachycardia were noted with the fastest interval lasting 9 beats and the longest interval lasting 12.6 seconds.  Patient triggered events were associated with normal sinus rhythm and rarely atrial tachycardic episodes.  If she would like, we can transition her from Lopressor to verapamil 180 mg daily to see if this improves her ectopy burden.  We could also see if she would want to see EP for consideration of SVT ablation.  She can discuss these options with her primary cardiologist when she is seen later this week.

## 2019-12-07 NOTE — Progress Notes (Signed)
Cardiology Office Note  Date:  12/08/2019   ID:  Elizabeth [name], DOB 1927/04/08, MRN [id number]  PCP:  Dale [place], MD   Chief Complaint  Patient presents with  . Other    6 week follow up. Meds reviewed vebrally with patient.     HPI:  Elizabeth [name] is an 85 year old woman with history of  hypertension,  hyperlipidemia  Rare episodes of near syncope, no arrhythmia on monitor who presents for  Follow-up of her arrhythmia, lightheadedness, Hypertension  Recently in the emergency room August 2020 for chest pain, ruled out, felt to be atypical in nature  Adjusting to loss of husband last year Lives independently alone in an apartment block  On last clinic visit November 2020 was walking on the treadmill Continued medication noncompliance Continued problems with anxiety  ZIO monitor with rare short runs of tachycardia SVT/atrial tach longest 12 seconds Average heart rate 64 bpm No change in monitor compares to 11/2018  Labs reviewed Total chol 151, LDL 81  Previously, very nervous about pills   EKG on today's visit shows normal sinus rhythm with rate 65 bpm, no significant ST or T-wave changes,  Poor R-wave progression to the anterior precordial leads  Other past medical hx previous episode of severe tachycardia , persisted longer than other episodes ,  Seem to go away on its own without intervention.  She does have significant stress at home, taking care of her husband. She is the primary caretaker   long history of short runs of palpitations, she has had these for 20 years  previously was using the treadmill 4-5 times per week She tried a lower dose of benazepril, reported her systolic pressure was 170 in the evenings on 20 mg daily, went back to 40 mg daily   PMH:   has a past medical history of Cystocele or rectocele with incomplete uterine prolapse, Hyperlipidemia, Hypertension, Irritable bowel syndrome, Palpitations, and Vaginal atrophy.  PSH:    Past Surgical  History:  Procedure Laterality Date  . CATARACT EXTRACTION  2006    Current Outpatient Medications  Medication Sig Dispense Refill  . ACCUPRIL 40 MG tablet TAKE 1 TABLET BY MOUTH ONCE DAILY 90 tablet 1  . aspirin 81 MG tablet Take 81 mg by mouth daily.    . cetirizine (ZYRTEC) 5 MG tablet Take 5 mg by mouth daily as needed.     Marland Kitchen estradiol (ESTRACE) 0.1 MG/GM vaginal cream PLACE 1 APPLICATORFUL VAGINALLY 2 TIMES A WEEK (Patient taking differently: Place 1 Applicatorful vaginally once a week. ) 42.5 g 2  . LIPITOR 40 MG tablet TAKE 1 TABLET BY MOUTH ONCE DAILY 90 tablet 3  . magnesium oxide (MAG-OX) 400 (241.3 Mg) MG tablet TAKE 1 TABLET BY MOUTH ONCE DAILY 30 tablet 6  . metoprolol tartrate (LOPRESSOR) 25 MG tablet TAKE 1 TABLET BY MOUTH TWICE DAILY 180 tablet 0  . triamcinolone (NASACORT) 55 MCG/ACT nasal inhaler Place 2 sprays into the nose daily as needed.      No current facility-administered medications for this visit.     Allergies:   Patient has no known allergies.   Social History:  The patient  reports that she has never smoked. She has never used smokeless tobacco. She reports that she does not drink alcohol or use drugs.   Family History:   family history includes Dementia in her mother and sister; Heart disease in her brother and sister; Leukemia in her brother; Lung cancer in her brother; Stroke in her  father; Throat cancer in her brother; Transient ischemic attack in her mother.    Review of Systems: Review of Systems  Constitutional: Negative.   HENT: Negative.   Respiratory: Negative.   Cardiovascular: Negative.   Gastrointestinal: Negative.   Musculoskeletal: Negative.   Neurological: Negative.   Psychiatric/Behavioral: The patient is nervous/anxious.   All other systems reviewed and are negative.   PHYSICAL EXAM: VS:  BP 140/68 (BP Location: Left Arm, Patient Position: Sitting, Cuff Size: Normal)   Pulse 65   Ht 5' 1.5" (1.562 m)   Wt 140 lb (63.5 kg)    SpO2 98%   BMI 26.02 kg/m  , BMI Body mass index is 26.02 kg/m. Constitutional:  oriented to person, place, and time. No distress.  HENT:  Head: Grossly normal Eyes:  no discharge. No scleral icterus.  Neck: No JVD, no carotid bruits  Cardiovascular: Regular rate and rhythm, no murmurs appreciated Pulmonary/Chest: Clear to auscultation bilaterally, no wheezes or rails Abdominal: Soft.  no distension.  no tenderness.  Musculoskeletal: Normal range of motion Neurological:  normal muscle tone. Coordination normal. No atrophy Skin: Skin warm and dry Psychiatric: normal affect, pleasant    Recent Labs: 07/16/2019: TSH 3.44 07/31/2019: ALT 24; BUN 23; Creatinine, Ser 1.09; Hemoglobin 14.2; Platelets 256; Potassium 4.5; Sodium 135    Lipid Panel Lab Results  Component Value Date   CHOL 151 07/16/2019   HDL 48.90 07/16/2019   LDLCALC 81 07/16/2019   TRIG 104.0 07/16/2019      Wt Readings from Last 3 Encounters:  12/08/19 140 lb (63.5 kg)  10/26/19 139 lb 8 oz (63.3 kg)  10/15/19 138 lb (62.6 kg)     ASSESSMENT AND PLAN:  Cardiac arrhythmia, unspecified cardiac arrhythmia type  Rare shorts runs of SVT/atrial tach on monitor this year and last year Minimal symptoms No med changes does not want propranolol  Essential hypertension -  Blood pressure is well controlled on today's visit. No changes made to the medications.  Hypercholesterolemia Back on her lipitor, numbers improved  Stress Significant anxiety, stress stable, recommended continued exercise    Total encounter time more than 25 minutes  Greater than 50% was spent in counseling and coordination of care with the patient  Disposition:   F/U  12 months   Orders Placed This Encounter  Procedures  . EKG 12-Lead     Signed, Esmond Plants, M.D., Ph.D. 12/08/2019  [place], [place]

## 2019-12-08 ENCOUNTER — Ambulatory Visit (INDEPENDENT_AMBULATORY_CARE_PROVIDER_SITE_OTHER): Payer: Medicare Other | Admitting: Cardiovascular Disease

## 2019-12-08 ENCOUNTER — Encounter: Payer: Self-pay | Admitting: Cardiovascular Disease

## 2019-12-08 ENCOUNTER — Other Ambulatory Visit: Payer: Self-pay

## 2019-12-08 VITALS — BP 140/68 | HR 65 | Ht 61.5 in | Wt 140.0 lb

## 2019-12-08 DIAGNOSIS — I471 Supraventricular tachycardia: Secondary | ICD-10-CM | POA: Diagnosis not present

## 2019-12-08 DIAGNOSIS — R0602 Shortness of breath: Secondary | ICD-10-CM | POA: Diagnosis not present

## 2019-12-08 DIAGNOSIS — R002 Palpitations: Secondary | ICD-10-CM

## 2019-12-08 DIAGNOSIS — E782 Mixed hyperlipidemia: Secondary | ICD-10-CM

## 2019-12-08 DIAGNOSIS — I10 Essential (primary) hypertension: Secondary | ICD-10-CM

## 2019-12-08 DIAGNOSIS — I872 Venous insufficiency (chronic) (peripheral): Secondary | ICD-10-CM

## 2019-12-08 NOTE — Patient Instructions (Signed)

## 2019-12-22 ENCOUNTER — Other Ambulatory Visit: Payer: Self-pay | Admitting: Cardiovascular Disease

## 2019-12-22 NOTE — Telephone Encounter (Signed)
°*  STAT* If patient is at the pharmacy, call can be transferred to refill team.   1. Which medications need to be refilled? (please list name of each medication and dose if known) metoprolol 25 mg bid  2. Which pharmacy/location (including street and city if local pharmacy) is medication to be sent to? [facility]  3. Do they need a 30 day or 90 day supply? 90  Patient is out of medication

## 2019-12-23 MED ORDER — METOPROLOL TARTRATE 25 MG PO TABS: 25.0000 mg | ORAL_TABLET | Freq: Two times a day (BID) | ORAL | 3 refills | Status: DC

## 2019-12-23 NOTE — Telephone Encounter (Signed)
Requested Prescriptions   Signed Prescriptions Disp Refills   metoprolol tartrate (LOPRESSOR) 25 MG tablet 180 tablet 3    Sig: Take 1 tablet (25 mg total) by mouth 2 (two) times daily.    Authorizing Provider: GOLLAN, TIMOTHY J    Ordering User: Elayne Gruver C    

## 2019-12-30 ENCOUNTER — Other Ambulatory Visit: Payer: Self-pay | Admitting: Internal Medicine

## 2019-12-31 ENCOUNTER — Telehealth: Payer: Self-pay | Admitting: Internal Medicine

## 2019-12-31 ENCOUNTER — Other Ambulatory Visit: Payer: Self-pay

## 2019-12-31 MED ORDER — ACCUPRIL 40 MG PO TABS: 40.0000 mg | ORAL_TABLET | Freq: Every day | ORAL | 1 refills | Status: DC

## 2019-12-31 NOTE — Telephone Encounter (Signed)
[facility] called and asked for refill on ACCUPRIL 40 MG tablet

## 2019-12-31 NOTE — Telephone Encounter (Signed)
Rx refilled.

## 2020-01-04 ENCOUNTER — Telehealth: Payer: Self-pay | Admitting: Internal Medicine

## 2020-01-04 ENCOUNTER — Other Ambulatory Visit: Payer: Self-pay

## 2020-01-04 MED ORDER — QUINAPRIL HCL 40 MG PO TABS: 40.0000 mg | ORAL_TABLET | Freq: Every day | ORAL | 0 refills | Status: DC

## 2020-01-04 NOTE — Telephone Encounter (Signed)
Patient brought in BP readings. She is out of BP medication, her pharmacy will not stock her ACCUPRIL 40 MG tablet, she is requesting generic. She is out of medication, since last Tuesday.

## 2020-01-04 NOTE — Telephone Encounter (Signed)
Generic rx sent in. Stated pharmacy doesn't carry brand name drug due to cost. Pt did not tolerate generic in past but stated she would like to try again

## 2020-01-05 ENCOUNTER — Other Ambulatory Visit (INDEPENDENT_AMBULATORY_CARE_PROVIDER_SITE_OTHER): Payer: Medicare Other

## 2020-01-05 ENCOUNTER — Other Ambulatory Visit: Payer: Self-pay

## 2020-01-05 DIAGNOSIS — I10 Essential (primary) hypertension: Secondary | ICD-10-CM | POA: Diagnosis not present

## 2020-01-05 DIAGNOSIS — E7800 Pure hypercholesterolemia, unspecified: Secondary | ICD-10-CM

## 2020-01-05 LAB — HEPATIC FUNCTION PANEL
ALT: 20 U/L (ref 0–35)
AST: 20 U/L (ref 0–37)
Albumin: 4.4 g/dL (ref 3.5–5.2)
Alkaline Phosphatase: 64 U/L (ref 39–117)
Bilirubin, Direct: 0.1 mg/dL (ref 0.0–0.3)
Total Bilirubin: 0.5 mg/dL (ref 0.2–1.2)
Total Protein: 6.5 g/dL (ref 6.0–8.3)

## 2020-01-05 LAB — BASIC METABOLIC PANEL
BUN: 20 mg/dL (ref 6–23)
CO2: 25 mEq/L (ref 19–32)
Calcium: 9.2 mg/dL (ref 8.4–10.5)
Chloride: 104 mEq/L (ref 96–112)
Creatinine, Ser: 1.02 mg/dL (ref 0.40–1.20)
GFR: 50.8 mL/min — ABNORMAL LOW (ref >60.00)
Glucose, Bld: 101 mg/dL — ABNORMAL HIGH (ref 70–99)
Potassium: 4.4 mEq/L (ref 3.5–5.1)
Sodium: 137 mEq/L (ref 135–145)

## 2020-01-05 LAB — LIPID PANEL
Cholesterol: 151 mg/dL (ref 0–200)
HDL: 48.2 mg/dL (ref >39.00)
LDL Cholesterol: 83 mg/dL (ref 0–99)
NonHDL: 102.6
Total CHOL/HDL Ratio: 3
Triglycerides: 98 mg/dL (ref 0.0–149.0)
VLDL: 19.6 mg/dL (ref 0.0–40.0)

## 2020-01-07 ENCOUNTER — Other Ambulatory Visit: Payer: Self-pay

## 2020-01-07 ENCOUNTER — Ambulatory Visit (INDEPENDENT_AMBULATORY_CARE_PROVIDER_SITE_OTHER): Payer: Medicare Other | Admitting: Internal Medicine

## 2020-01-07 ENCOUNTER — Encounter: Payer: Self-pay | Admitting: Internal Medicine

## 2020-01-07 VITALS — BP 139/83 | Ht 61.0 in | Wt 137.0 lb

## 2020-01-07 DIAGNOSIS — Z9109 Other allergy status, other than to drugs and biological substances: Secondary | ICD-10-CM | POA: Diagnosis not present

## 2020-01-07 DIAGNOSIS — R739 Hyperglycemia, unspecified: Secondary | ICD-10-CM | POA: Diagnosis not present

## 2020-01-07 DIAGNOSIS — E7800 Pure hypercholesterolemia, unspecified: Secondary | ICD-10-CM

## 2020-01-07 DIAGNOSIS — E1165 Type 2 diabetes mellitus with hyperglycemia: Secondary | ICD-10-CM | POA: Insufficient documentation

## 2020-01-07 DIAGNOSIS — I471 Supraventricular tachycardia: Secondary | ICD-10-CM

## 2020-01-07 DIAGNOSIS — I10 Essential (primary) hypertension: Secondary | ICD-10-CM

## 2020-01-07 DIAGNOSIS — K219 Gastro-esophageal reflux disease without esophagitis: Secondary | ICD-10-CM

## 2020-01-07 DIAGNOSIS — F439 Reaction to severe stress, unspecified: Secondary | ICD-10-CM

## 2020-01-07 MED ORDER — QUINAPRIL HCL 40 MG PO TABS: 40.0000 mg | ORAL_TABLET | Freq: Every day | ORAL | 2 refills | Status: DC

## 2020-01-07 NOTE — Progress Notes (Signed)
Patient ID: Elizabeth [name], female   DOB: March 07, 1927, 86 y.o.   MRN: [id number]   Virtual Visit via telephone Note  This visit type was conducted due to national recommendations for restrictions regarding the COVID-19 pandemic (e.g. social distancing).  This format is felt to be most appropriate for this patient at this time.  All issues noted in this document were discussed and addressed.  No physical exam was performed (except for noted visual exam findings with Video Visits).   I connected with Elizabeth [name] by telephone and verified that I am speaking with the correct person using two identifiers. Location patient: home Location provider: work Persons participating in the telephone visit: patient, provider  The limitations, risks, security and privacy concerns of performing an evaluation and management service by telephone and the availability of in person appointments have been discussed. The patient expressed understanding and agreed to proceed.   Reason for visit: scheduled follow up.    HPI: She reports she is doing relatively well.  Not as active.  Still walks on her treadmill 30 minutes per day.  No chest pain reported.  Seeing cardiology.  Last evaluated 12/08/19.  [facility] - short runs - tachy (SVT/atrial tach).  On metoprolol.  Stable.  Handling stress.  Not eating as much.  No nausea or vomiting reported.  No abdominal pain.  Bowels stable.  Started generic accupril.  Blood pressure doing ok on the generic - 139/83.     ROS: See pertinent positives and negatives per HPI.  Past Medical History:  Diagnosis Date  . Cystocele or rectocele with incomplete uterine prolapse   . Hyperlipidemia   . Hypertension   . Irritable bowel syndrome   . Palpitations   . Vaginal atrophy     Past Surgical History:  Procedure Laterality Date  . CATARACT EXTRACTION  2006    Family History  Problem Relation Age of Onset  . Transient ischemic attack Mother        multiple   . Dementia Mother     . Stroke Father   . Heart disease Brother        MI - died age 79  . Leukemia Brother   . Lung cancer Brother   . Throat cancer Brother        had heart disease also  . Heart disease Sister   . Dementia Sister     SOCIAL HX: reviewed.    Current Outpatient Medications:  .  aspirin 81 MG tablet, Take 81 mg by mouth daily., Disp: , Rfl:  .  cetirizine (ZYRTEC) 5 MG tablet, Take 5 mg by mouth daily as needed. , Disp: , Rfl:  .  estradiol (ESTRACE) 0.1 MG/GM vaginal cream, PLACE 1 APPLICATORFUL VAGINALLY 2 TIMES A WEEK (Patient taking differently: Place 1 Applicatorful vaginally once a week. ), Disp: 42.5 g, Rfl: 2 .  LIPITOR 40 MG tablet, TAKE 1 TABLET BY MOUTH ONCE DAILY, Disp: 90 tablet, Rfl: 3 .  magnesium oxide (MAG-OX) 400 (241.3 Mg) MG tablet, TAKE 1 TABLET BY MOUTH ONCE DAILY, Disp: 30 tablet, Rfl: 6 .  metoprolol tartrate (LOPRESSOR) 25 MG tablet, Take 1 tablet (25 mg total) by mouth 2 (two) times daily., Disp: 180 tablet, Rfl: 3 .  quinapril (ACCUPRIL) 40 MG tablet, Take 1 tablet (40 mg total) by mouth daily., Disp: 30 tablet, Rfl: 2 .  triamcinolone (NASACORT) 55 MCG/ACT nasal inhaler, Place 2 sprays into the nose daily as needed. , Disp: , Rfl:   EXAM:  VITALS per patient if applicable: 234/14  GENERAL: alert.  Sounds to be in no acute distress.  Answering questions appropriately.    PSYCH/NEURO: pleasant and cooperative, no obvious depression or anxiety, speech and thought processing grossly intact  ASSESSMENT AND PLAN:  Discussed the following assessment and plan:  Environmental allergies Stable.   GERD (gastroesophageal reflux disease) Controlled    Hypercholesterolemia On lipitor.  Follow lipid panel and liver function tests.    Hyperglycemia Follow met b and a1c.   Hypertension Blood pressure as outlined.  Started generic accupril.  Continue current medication regimen.  Follow pressures.  Follow metabolic panel.    Stress Overall appears to be  handling stress well.  Follow.    SVT (supraventricular tachycardia) ([place]) Followed by cardiology.  W/up as outlined.  On metoprolol.  Stable.     Orders Placed This Encounter  Procedures  . Hepatic function panel    Standing Status:   Future    Standing Expiration Date:   01/06/2021  . Hemoglobin A1c    Standing Status:   Future    Standing Expiration Date:   01/06/2021  . Lipid panel    Standing Status:   Future    Standing Expiration Date:   01/06/2021  . Basic metabolic panel (future)    Standing Status:   Future    Standing Expiration Date:   01/06/2021    Meds ordered this encounter  Medications  . quinapril (ACCUPRIL) 40 MG tablet    Sig: Take 1 tablet (40 mg total) by mouth daily.    Dispense:  30 tablet    Refill:  2     I discussed the assessment and treatment plan with the patient. The patient was provided an opportunity to ask questions and all were answered. The patient agreed with the plan and demonstrated an understanding of the instructions.   The patient was advised to call back or seek an in-person evaluation if the symptoms worsen or if the condition fails to improve as anticipated.  I provided 22 minutes of non-face-to-face time during this encounter.   Einar Pheasant, MD

## 2020-01-10 ENCOUNTER — Encounter: Payer: Self-pay | Admitting: Internal Medicine

## 2020-01-10 NOTE — Assessment & Plan Note (Signed)
Blood pressure as outlined.  Started generic accupril.  Continue current medication regimen.  Follow pressures.  Follow metabolic panel.

## 2020-01-10 NOTE — Assessment & Plan Note (Signed)
Stable

## 2020-01-10 NOTE — Assessment & Plan Note (Signed)
Followed by cardiology.  W/up as outlined.  On metoprolol.  Stable.

## 2020-01-10 NOTE — Assessment & Plan Note (Signed)
Follow met b and a1c.  

## 2020-01-10 NOTE — Assessment & Plan Note (Signed)
On lipitor.  Follow lipid panel and liver function tests.   

## 2020-01-10 NOTE — Assessment & Plan Note (Signed)
Controlled.  

## 2020-01-10 NOTE — Assessment & Plan Note (Signed)
Overall appears to be handling stress well.  Follow.  

## 2020-03-01 ENCOUNTER — Other Ambulatory Visit: Payer: Self-pay

## 2020-03-01 ENCOUNTER — Telehealth: Payer: Self-pay | Admitting: Internal Medicine

## 2020-03-01 MED ORDER — QUINAPRIL HCL 40 MG PO TABS: 40.0000 mg | ORAL_TABLET | Freq: Every day | ORAL | 1 refills | Status: DC

## 2020-03-01 NOTE — Telephone Encounter (Signed)
90 day supply sent to [facility]

## 2020-03-01 NOTE — Telephone Encounter (Signed)
[facility] called pt is wanting the quinapril (ACCUPRIL) 40 MG tablet as a 90 day supply

## 2020-05-17 ENCOUNTER — Other Ambulatory Visit (INDEPENDENT_AMBULATORY_CARE_PROVIDER_SITE_OTHER): Payer: Medicare Other

## 2020-05-17 ENCOUNTER — Other Ambulatory Visit: Payer: Self-pay

## 2020-05-17 DIAGNOSIS — I10 Essential (primary) hypertension: Secondary | ICD-10-CM

## 2020-05-17 DIAGNOSIS — E7800 Pure hypercholesterolemia, unspecified: Secondary | ICD-10-CM | POA: Diagnosis not present

## 2020-05-17 DIAGNOSIS — R739 Hyperglycemia, unspecified: Secondary | ICD-10-CM | POA: Diagnosis not present

## 2020-05-17 LAB — BASIC METABOLIC PANEL
BUN: 19 mg/dL (ref 6–23)
CO2: 22 mEq/L (ref 19–32)
Calcium: 9 mg/dL (ref 8.4–10.5)
Chloride: 104 mEq/L (ref 96–112)
Creatinine, Ser: 0.94 mg/dL (ref 0.40–1.20)
GFR: 55.78 mL/min — ABNORMAL LOW (ref >60.00)
Glucose, Bld: 101 mg/dL — ABNORMAL HIGH (ref 70–99)
Potassium: 4.4 mEq/L (ref 3.5–5.1)
Sodium: 134 mEq/L — ABNORMAL LOW (ref 135–145)

## 2020-05-17 LAB — LIPID PANEL
Cholesterol: 181 mg/dL (ref 0–200)
HDL: 49.1 mg/dL (ref >39.00)
LDL Cholesterol: 109 mg/dL — ABNORMAL HIGH (ref 0–99)
NonHDL: 132.12
Total CHOL/HDL Ratio: 4
Triglycerides: 115 mg/dL (ref 0.0–149.0)
VLDL: 23 mg/dL (ref 0.0–40.0)

## 2020-05-17 LAB — HEPATIC FUNCTION PANEL
ALT: 18 U/L (ref 0–35)
AST: 19 U/L (ref 0–37)
Albumin: 4.2 g/dL (ref 3.5–5.2)
Alkaline Phosphatase: 65 U/L (ref 39–117)
Bilirubin, Direct: 0.1 mg/dL (ref 0.0–0.3)
Total Bilirubin: 0.5 mg/dL (ref 0.2–1.2)
Total Protein: 6.1 g/dL (ref 6.0–8.3)

## 2020-05-17 LAB — HEMOGLOBIN A1C: Hgb A1c MFr Bld: 6.5 % (ref 4.6–6.5)

## 2020-05-19 ENCOUNTER — Encounter: Payer: Self-pay | Admitting: Internal Medicine

## 2020-05-19 ENCOUNTER — Other Ambulatory Visit: Payer: Self-pay

## 2020-05-19 ENCOUNTER — Ambulatory Visit: Payer: Medicare Other | Admitting: Internal Medicine

## 2020-05-19 DIAGNOSIS — F439 Reaction to severe stress, unspecified: Secondary | ICD-10-CM

## 2020-05-19 DIAGNOSIS — I10 Essential (primary) hypertension: Secondary | ICD-10-CM

## 2020-05-19 DIAGNOSIS — Z9109 Other allergy status, other than to drugs and biological substances: Secondary | ICD-10-CM

## 2020-05-19 DIAGNOSIS — R739 Hyperglycemia, unspecified: Secondary | ICD-10-CM | POA: Diagnosis not present

## 2020-05-19 DIAGNOSIS — I471 Supraventricular tachycardia: Secondary | ICD-10-CM

## 2020-05-19 DIAGNOSIS — E7800 Pure hypercholesterolemia, unspecified: Secondary | ICD-10-CM

## 2020-05-19 DIAGNOSIS — R531 Weakness: Secondary | ICD-10-CM

## 2020-05-19 NOTE — Progress Notes (Signed)
Patient ID: Elizabeth [name], female   DOB: Jan 06, 1927, 86 y.o.   MRN: [id number]   Subjective:    Patient ID: Elizabeth [name], female    DOB: July 03, 1927, 86 y.o.   MRN: [id number]  HPI This visit occurred during the SARS-CoV-2 public health emergency.  Safety protocols were in place, including screening questions prior to the visit, additional usage of staff PPE, and extensive cleaning of exam room while observing appropriate contact time as indicated for disinfecting solutions.  Patient here for a scheduled follow up.  She reports she is doing relatively well.  Not getting out.  Not seeing family as much - due to covid.  She is not as active.  States she has slowed down.  Has to use her arms to push herself up.  No chest pain.  Heart overall stable. No acid reflux or abdominal pain reported.  Bowels stable.  Has hearing aids.  Discussed therapy.    Past Medical History:  Diagnosis Date  . Cystocele or rectocele with incomplete uterine prolapse   . Hyperlipidemia   . Hypertension   . Irritable bowel syndrome   . Palpitations   . Vaginal atrophy    Past Surgical History:  Procedure Laterality Date  . CATARACT EXTRACTION  2006   Family History  Problem Relation Age of Onset  . Transient ischemic attack Mother        multiple   . Dementia Mother   . Stroke Father   . Heart disease Brother        MI - died age 44  . Leukemia Brother   . Lung cancer Brother   . Throat cancer Brother        had heart disease also  . Heart disease Sister   . Dementia Sister    Social History   Socioeconomic History  . Marital status: Widowed    Spouse name: Not on file  . Number of children: 2  . Years of education: Not on file  . Highest education level: Not on file  Occupational History  . Not on file  Tobacco Use  . Smoking status: Never Smoker  . Smokeless tobacco: Never Used  Substance and Sexual Activity  . Alcohol use: No    Alcohol/week: 0.0 standard drinks  . Drug use: No  . Sexual  activity: Not Currently    Birth control/protection: None  Other Topics Concern  . Not on file  Social History Narrative  . Not on file   Social Determinants of Health   Financial Resource Strain: Low Risk   . Difficulty of Paying Living Expenses: Not hard at all  Food Insecurity: No Food Insecurity  . Worried About Charity fundraiser in the Last Year: Never true  . Ran Out of Food in the Last Year: Never true  Transportation Needs: No Transportation Needs  . Lack of Transportation (Medical): No  . Lack of Transportation (Non-Medical): No  Physical Activity: Sufficiently Active  . Days of Exercise per Week: 7 days  . Minutes of Exercise per Session: 30 min  Stress: No Stress Concern Present  . Feeling of Stress : Not at all  Social Connections: Unknown  . Frequency of Communication with Friends and Family: Not on file  . Frequency of Social Gatherings with Friends and Family: Not on file  . Attends Religious Services: Not on file  . Active Member of Clubs or Organizations: Not on file  . Attends Archivist Meetings: Not on file  .  Marital Status: Widowed    Outpatient Encounter Medications as of 05/19/2020  Medication Sig  . aspirin 81 MG tablet Take 81 mg by mouth daily.  . cetirizine (ZYRTEC) 5 MG tablet Take 5 mg by mouth daily as needed.   Marland Kitchen estradiol (ESTRACE) 0.1 MG/GM vaginal cream PLACE 1 APPLICATORFUL VAGINALLY 2 TIMES A WEEK (Patient taking differently: Place 1 Applicatorful vaginally once a week. )  . LIPITOR 40 MG tablet TAKE 1 TABLET BY MOUTH ONCE DAILY  . magnesium oxide (MAG-OX) 400 (241.3 Mg) MG tablet TAKE 1 TABLET BY MOUTH ONCE DAILY  . metoprolol tartrate (LOPRESSOR) 25 MG tablet Take 1 tablet (25 mg total) by mouth 2 (two) times daily.  . quinapril (ACCUPRIL) 40 MG tablet Take 1 tablet (40 mg total) by mouth daily.  Marland Kitchen triamcinolone (NASACORT) 55 MCG/ACT nasal inhaler Place 2 sprays into the nose daily as needed.    No facility-administered  encounter medications on file as of 05/19/2020.    Review of Systems  Constitutional: Negative for appetite change and unexpected weight change.  HENT: Negative for congestion and sinus pressure.   Respiratory: Negative for cough, chest tightness and shortness of breath.   Cardiovascular: Negative for chest pain, palpitations and leg swelling.  Gastrointestinal: Negative for abdominal pain, diarrhea, nausea and vomiting.  Genitourinary: Negative for difficulty urinating and dysuria.  Musculoskeletal: Negative for joint swelling and myalgias.       Weakness as outlined.    Skin: Negative for color change and rash.  Neurological: Negative for dizziness, light-headedness and headaches.  Psychiatric/Behavioral: Negative for agitation and dysphoric mood.       Objective:    Physical Exam Constitutional:      General: She is not in acute distress.    Appearance: Normal appearance.  HENT:     Head: Normocephalic and atraumatic.     Right Ear: External ear normal.     Left Ear: External ear normal.  Eyes:     General: No scleral icterus.       Right eye: No discharge.        Left eye: No discharge.     Conjunctiva/sclera: Conjunctivae normal.  Neck:     Thyroid: No thyromegaly.  Cardiovascular:     Rate and Rhythm: Normal rate and regular rhythm.  Pulmonary:     Effort: No respiratory distress.     Breath sounds: Normal breath sounds. No wheezing.  Abdominal:     General: Bowel sounds are normal.     Palpations: Abdomen is soft.     Tenderness: There is no abdominal tenderness.  Musculoskeletal:        General: No swelling or tenderness.     Cervical back: Neck supple. No tenderness.  Lymphadenopathy:     Cervical: No cervical adenopathy.  Skin:    Findings: No erythema or rash.  Neurological:     Mental Status: She is alert.  Psychiatric:        Mood and Affect: Mood normal.        Behavior: Behavior normal.     BP 124/72   Pulse 74   Temp 97.8 F (36.6 C)    Resp 16   Ht '5\' 1"'  (1.549 m)   Wt 139 lb (63 kg)   SpO2 97%   BMI 26.26 kg/m  Wt Readings from Last 3 Encounters:  05/19/20 139 lb (63 kg)  01/07/20 137 lb (62.1 kg)  12/08/19 140 lb (63.5 kg)     Lab Results  Component Value Date   WBC 7.5 07/31/2019   HGB 14.2 07/31/2019   HCT 42.8 07/31/2019   PLT 256 07/31/2019   GLUCOSE 101 (H) 05/17/2020   CHOL 181 05/17/2020   TRIG 115.0 05/17/2020   HDL 49.10 05/17/2020   LDLDIRECT 178.4 11/04/2013   LDLCALC 109 (H) 05/17/2020   ALT 18 05/17/2020   AST 19 05/17/2020   NA 134 (L) 05/17/2020   K 4.4 05/17/2020   CL 104 05/17/2020   CREATININE 0.94 05/17/2020   BUN 19 05/17/2020   CO2 22 05/17/2020   TSH 3.44 07/16/2019   HGBA1C 6.5 05/17/2020    DG Chest 2 View  Result Date: 07/31/2019 CLINICAL DATA:  86 year old female with history of pain under the left shoulder blade radiating into the left arm for the past 2 days. EXAM: CHEST - 2 VIEW COMPARISON:  Chest x-ray 11/04/2018. FINDINGS: Lung volumes are normal. No consolidative airspace disease. No pleural effusions. No pneumothorax. No pulmonary nodule or mass noted. Pulmonary vasculature and the cardiomediastinal silhouette are within normal limits. Atherosclerosis in the thoracic aorta. IMPRESSION: 1.  No radiographic evidence of acute cardiopulmonary disease. 2. Aortic atherosclerosis. Electronically Signed   By: Vinnie Langton M.D.   On: 07/31/2019 12:13       Assessment & Plan:   Problem List Items Addressed This Visit    Environmental allergies    Stable.       Hypercholesterolemia    On lipitor.  Low cholesterol diet and exercise.  Follow lipid panel and liver function tests.        Relevant Orders   Hepatic function panel   Lipid panel   Hyperglycemia    Low carb diet and exercise.  Follow met b and a1c.       Relevant Orders   Hemoglobin A1c   Hypertension    Blood pressure doing well.  On quinapril and metoprolol.  Follow pressures.  Follow metabolic  panel.       Relevant Orders   CBC with Differential/Platelet   TSH   Basic metabolic panel   Stress    Increased stress with covid restrictions. Not getting out as much.  Discussed with her today.  Overall she feels she is handling things well.  Follow.        SVT (supraventricular tachycardia) ([place])    Followed by cardiology.  Stable.  On metoprolol.        Weakness    Describes the weakness as outlined.  Has to use her arms to get up.  Not as active.  Discussed physical therapy for strengthening, etc.  She is in agreement.        Relevant Orders   Ambulatory referral to Physical Therapy       Einar Pheasant, MD

## 2020-05-29 ENCOUNTER — Encounter: Payer: Self-pay | Admitting: Internal Medicine

## 2020-05-29 DIAGNOSIS — R531 Weakness: Secondary | ICD-10-CM | POA: Insufficient documentation

## 2020-05-29 NOTE — Assessment & Plan Note (Signed)
Describes the weakness as outlined.  Has to use her arms to get up.  Not as active.  Discussed physical therapy for strengthening, etc.  She is in agreement.

## 2020-05-29 NOTE — Assessment & Plan Note (Signed)
Low carb diet and exercise.  Follow met b and a1c.  

## 2020-05-29 NOTE — Assessment & Plan Note (Signed)
Blood pressure doing well.  On quinapril and metoprolol.  Follow pressures.  Follow metabolic panel.

## 2020-05-29 NOTE — Assessment & Plan Note (Signed)
Stable

## 2020-05-29 NOTE — Assessment & Plan Note (Signed)
On lipitor.  Low cholesterol diet and exercise.  Follow lipid panel and liver function tests.   

## 2020-05-29 NOTE — Assessment & Plan Note (Signed)
Followed by cardiology.  Stable.  On metoprolol.

## 2020-05-29 NOTE — Assessment & Plan Note (Signed)
Increased stress with covid restrictions. Not getting out as much.  Discussed with her today.  Overall she feels she is handling things well.  Follow.

## 2020-09-15 ENCOUNTER — Other Ambulatory Visit: Payer: Self-pay

## 2020-09-15 ENCOUNTER — Other Ambulatory Visit (INDEPENDENT_AMBULATORY_CARE_PROVIDER_SITE_OTHER): Payer: Medicare Other

## 2020-09-15 DIAGNOSIS — E7800 Pure hypercholesterolemia, unspecified: Secondary | ICD-10-CM | POA: Diagnosis not present

## 2020-09-15 DIAGNOSIS — R739 Hyperglycemia, unspecified: Secondary | ICD-10-CM

## 2020-09-15 DIAGNOSIS — I10 Essential (primary) hypertension: Secondary | ICD-10-CM | POA: Diagnosis not present

## 2020-09-15 LAB — BASIC METABOLIC PANEL
BUN: 23 mg/dL (ref 6–23)
CO2: 24 mEq/L (ref 19–32)
Calcium: 8.8 mg/dL (ref 8.4–10.5)
Chloride: 104 mEq/L (ref 96–112)
Creatinine, Ser: 1.04 mg/dL (ref 0.40–1.20)
GFR: 49.6 mL/min — ABNORMAL LOW (ref >60.00)
Glucose, Bld: 78 mg/dL (ref 70–99)
Potassium: 4.6 mEq/L (ref 3.5–5.1)
Sodium: 137 mEq/L (ref 135–145)

## 2020-09-15 LAB — HEPATIC FUNCTION PANEL
ALT: 16 U/L (ref 0–35)
AST: 18 U/L (ref 0–37)
Albumin: 4.1 g/dL (ref 3.5–5.2)
Alkaline Phosphatase: 62 U/L (ref 39–117)
Bilirubin, Direct: 0.1 mg/dL (ref 0.0–0.3)
Total Bilirubin: 0.5 mg/dL (ref 0.2–1.2)
Total Protein: 6 g/dL (ref 6.0–8.3)

## 2020-09-15 LAB — CBC WITH DIFFERENTIAL/PLATELET
Basophils Absolute: 0.1 10*3/uL (ref 0.0–0.1)
Basophils Relative: 0.9 % (ref 0.0–3.0)
Eosinophils Absolute: 0.2 10*3/uL (ref 0.0–0.7)
Eosinophils Relative: 2.5 % (ref 0.0–5.0)
HCT: 42.1 % (ref 36.0–46.0)
Hemoglobin: 14.1 g/dL (ref 12.0–15.0)
Lymphocytes Relative: 36.4 % (ref 12.0–46.0)
Lymphs Abs: 2.3 10*3/uL (ref 0.7–4.0)
MCHC: 33.5 g/dL (ref 30.0–36.0)
MCV: 93.2 fl (ref 78.0–100.0)
Monocytes Absolute: 0.4 10*3/uL (ref 0.1–1.0)
Monocytes Relative: 6.9 % (ref 3.0–12.0)
Neutro Abs: 3.4 10*3/uL (ref 1.4–7.7)
Neutrophils Relative %: 53.3 % (ref 43.0–77.0)
Platelets: 219 10*3/uL (ref 150.0–400.0)
RBC: 4.52 Mil/uL (ref 3.87–5.11)
RDW: 14.2 % (ref 11.5–15.5)
WBC: 6.4 10*3/uL (ref 4.0–10.5)

## 2020-09-15 LAB — LIPID PANEL
Cholesterol: 149 mg/dL (ref 0–200)
HDL: 48.3 mg/dL (ref >39.00)
LDL Cholesterol: 81 mg/dL (ref 0–99)
NonHDL: 100.23
Total CHOL/HDL Ratio: 3
Triglycerides: 96 mg/dL (ref 0.0–149.0)
VLDL: 19.2 mg/dL (ref 0.0–40.0)

## 2020-09-15 LAB — HEMOGLOBIN A1C: Hgb A1c MFr Bld: 6.7 % — ABNORMAL HIGH (ref 4.6–6.5)

## 2020-09-15 LAB — TSH: TSH: 4.91 u[IU]/mL — ABNORMAL HIGH (ref 0.35–4.50)

## 2020-09-16 ENCOUNTER — Ambulatory Visit (INDEPENDENT_AMBULATORY_CARE_PROVIDER_SITE_OTHER): Payer: Medicare Other

## 2020-09-16 ENCOUNTER — Ambulatory Visit: Payer: Medicare Other

## 2020-09-16 VITALS — Ht 61.0 in | Wt 139.0 lb

## 2020-09-16 DIAGNOSIS — Z0000 Encounter for general adult medical examination without abnormal findings: Secondary | ICD-10-CM | POA: Diagnosis not present

## 2020-09-16 NOTE — Patient Instructions (Addendum)
Elizabeth [name] , Thank you for taking time to come for your Medicare Wellness Visit. I appreciate your ongoing commitment to your health goals. Please review the following plan we discussed and let me know if I can assist you in the future.   These are the goals we discussed: Goals    . Increase physical activity     Use the treadmill as tolerated 30 minutes daily       This is a list of the screening recommended for you and due dates:  Health Maintenance  Topic Date Due  . COVID-19 Vaccine (1) Never done  . Tetanus Vaccine  Never done  . DEXA scan (bone density measurement)  Never done  . Flu Shot  07/24/2020  . Pneumonia vaccines  Completed  . Mammogram  Discontinued   Immunizations Immunization History  Administered Date(s) Administered  . Fluad Quad(high Dose 65+) 10/15/2019  . Influenza Split 10/19/2009, 10/13/2013, 09/30/2014  . Influenza, High Dose Seasonal PF 09/28/2016, 10/30/2017, 11/04/2018  . Influenza-Unspecified 10/24/2015  . Pneumococcal Conjugate-13 03/11/2014  . Pneumococcal Polysaccharide-23 05/31/2017   Keep all routine maintenance appointments.   Cpe 09/20/20@ 1:30  Advanced directives: End of life planning; Advance aging; Advanced directives discussed.  Copy of current HCPOA/Living Will requested.    Conditions/risks identified: none new  Follow up in one year for your annual wellness visit.   Preventive Care 84 Years and Older, Female Preventive care refers to lifestyle choices and visits with your health care provider that can promote health and wellness. What does preventive care include?  A yearly physical exam. This is also called an annual well check.  Dental exams once or twice a year.  Routine eye exams. Ask your health care provider how often you should have your eyes checked.  Personal lifestyle choices, including:  Daily care of your teeth and gums.  Regular physical activity.  Eating a healthy diet.  Avoiding tobacco and drug  use.  Limiting alcohol use.  Practicing safe sex.  Taking low-dose aspirin every day.  Taking vitamin and mineral supplements as recommended by your health care provider. What happens during an annual well check? The services and screenings done by your health care provider during your annual well check will depend on your age, overall health, lifestyle risk factors, and family history of disease. Counseling  Your health care provider may ask you questions about your:  Alcohol use.  Tobacco use.  Drug use.  Emotional well-being.  Home and relationship well-being.  Sexual activity.  Eating habits.  History of falls.  Memory and ability to understand (cognition).  Work and work Astronomer.  Reproductive health. Screening  You may have the following tests or measurements:  Height, weight, and BMI.  Blood pressure.  Lipid and cholesterol levels. These may be checked every 5 years, or more frequently if you are over 79 years old.  Skin check.  Lung cancer screening. You may have this screening every year starting at age 92 if you have a 30-pack-year history of smoking and currently smoke or have quit within the past 15 years.  Fecal occult blood test (FOBT) of the stool. You may have this test every year starting at age 90.  Flexible sigmoidoscopy or colonoscopy. You may have a sigmoidoscopy every 5 years or a colonoscopy every 10 years starting at age 69.  Hepatitis C blood test.  Hepatitis B blood test.  Sexually transmitted disease (STD) testing.  Diabetes screening. This is done by checking your blood sugar (glucose)  after you have not eaten for a while (fasting). You may have this done every 1-3 years.  Bone density scan. This is done to screen for osteoporosis. You may have this done starting at age 65.  Mammogram. This may be done every 1-2 years. Talk to your health care provider about how often you should have regular mammograms. Talk with your  health care provider about your test results, treatment options, and if necessary, the need for more tests. Vaccines  Your health care provider may recommend certain vaccines, such as:  Influenza vaccine. This is recommended every year.  Tetanus, diphtheria, and acellular pertussis (Tdap, Td) vaccine. You may need a Td booster every 10 years.  Zoster vaccine. You may need this after age 60.  Pneumococcal 13-valent conjugate (PCV13) vaccine. One dose is recommended after age 52.  Pneumococcal polysaccharide (PPSV23) vaccine. One dose is recommended after age 5. Talk to your health care provider about which screenings and vaccines you need and how often you need them. This information is not intended to replace advice given to you by your health care provider. Make sure you discuss any questions you have with your health care provider. Document Released: 01/06/2016 Document Revised: 08/29/2016 Document Reviewed: 10/11/2015 Elsevier Interactive Patient Education  2017 [place] Prevention in the Home Falls can cause injuries. They can happen to people of all ages. There are many things you can do to make your home safe and to help prevent falls. What can I do on the outside of my home?  Regularly fix the edges of walkways and driveways and fix any cracks.  Remove anything that might make you trip as you walk through a door, such as a raised step or threshold.  Trim any bushes or trees on the path to your home.  Use bright outdoor lighting.  Clear any walking paths of anything that might make someone trip, such as rocks or tools.  Regularly check to see if handrails are loose or broken. Make sure that both sides of any steps have handrails.  Any raised decks and porches should have guardrails on the edges.  Have any leaves, snow, or ice cleared regularly.  Use sand or salt on walking paths during winter.  Clean up any spills in your garage right away. This includes oil  or grease spills. What can I do in the bathroom?  Use night lights.  Install grab bars by the toilet and in the tub and shower. Do not use towel bars as grab bars.  Use non-skid mats or decals in the tub or shower.  If you need to sit down in the shower, use a plastic, non-slip stool.  Keep the floor dry. Clean up any water that spills on the floor as soon as it happens.  Remove soap buildup in the tub or shower regularly.  Attach bath mats securely with double-sided non-slip rug tape.  Do not have throw rugs and other things on the floor that can make you trip. What can I do in the bedroom?  Use night lights.  Make sure that you have a light by your bed that is easy to reach.  Do not use any sheets or blankets that are too big for your bed. They should not hang down onto the floor.  Have a firm chair that has side arms. You can use this for support while you get dressed.  Do not have throw rugs and other things on the floor that can make you  trip. What can I do in the kitchen?  Clean up any spills right away.  Avoid walking on wet floors.  Keep items that you use a lot in easy-to-reach places.  If you need to reach something above you, use a strong step stool that has a grab bar.  Keep electrical cords out of the way.  Do not use floor polish or wax that makes floors slippery. If you must use wax, use non-skid floor wax.  Do not have throw rugs and other things on the floor that can make you trip. What can I do with my stairs?  Do not leave any items on the stairs.  Make sure that there are handrails on both sides of the stairs and use them. Fix handrails that are broken or loose. Make sure that handrails are as long as the stairways.  Check any carpeting to make sure that it is firmly attached to the stairs. Fix any carpet that is loose or worn.  Avoid having throw rugs at the top or bottom of the stairs. If you do have throw rugs, attach them to the floor with  carpet tape.  Make sure that you have a light switch at the top of the stairs and the bottom of the stairs. If you do not have them, ask someone to add them for you. What else can I do to help prevent falls?  Wear shoes that:  Do not have high heels.  Have rubber bottoms.  Are comfortable and fit you well.  Are closed at the toe. Do not wear sandals.  If you use a stepladder:  Make sure that it is fully opened. Do not climb a closed stepladder.  Make sure that both sides of the stepladder are locked into place.  Ask someone to hold it for you, if possible.  Clearly mark and make sure that you can see:  Any grab bars or handrails.  First and last steps.  Where the edge of each step is.  Use tools that help you move around (mobility aids) if they are needed. These include:  Canes.  Walkers.  Scooters.  Crutches.  Turn on the lights when you go into a dark area. Replace any light bulbs as soon as they burn out.  Set up your furniture so you have a clear path. Avoid moving your furniture around.  If any of your floors are uneven, fix them.  If there are any pets around you, be aware of where they are.  Review your medicines with your doctor. Some medicines can make you feel dizzy. This can increase your chance of falling. Ask your doctor what other things that you can do to help prevent falls. This information is not intended to replace advice given to you by your health care provider. Make sure you discuss any questions you have with your health care provider. Document Released: 10/06/2009 Document Revised: 05/17/2016 Document Reviewed: 01/14/2015 Elsevier Interactive Patient Education  2017 [employer].

## 2020-09-16 NOTE — Progress Notes (Addendum)
Subjective:   Elizabeth [name] is a 86 y.o. female who presents for Medicare Annual (Subsequent) preventive examination.  Review of Systems    No ROS.  Medicare Wellness Virtual Visit.    Cardiac Risk Factors include: advanced age (>8655men, 51>65 women);hypertension     Objective:    Today's Vitals   09/16/20 1234  Weight: 139 lb (63 kg)  Height: 5\' 1"  (1.549 m)   Body mass index is 26.26 kg/m.  Advanced Directives 09/16/2020 09/16/2019 07/31/2019 01/06/2019 05/28/2018 05/27/2017  Does Patient Have a Medical Advance Directive? Yes No No Yes Yes No  Type of Diplomatic Services operational officerAdvance Directive Healthcare Power of Attorney - - Healthcare Power of State Street Corporationttorney Healthcare Power of [place];Living will -  Does patient want to make changes to medical advance directive? No - Patient declined - - Yes (Inpatient - patient requests chaplain consult to change a medical advance directive) No - Patient declined Yes (MAU/Ambulatory/Procedural Areas - Information given)  Copy of Healthcare Power of Attorney in Chart? No - copy requested - - - No - copy requested -  Would patient like information on creating a medical advance directive? - No - Patient declined - - - -    Current Medications (verified) Outpatient Encounter Medications as of 09/16/2020  Medication Sig  . aspirin 81 MG tablet Take 81 mg by mouth daily.  . cetirizine (ZYRTEC) 5 MG tablet Take 5 mg by mouth daily as needed.   Marland Kitchen. estradiol (ESTRACE) 0.1 MG/GM vaginal cream PLACE 1 APPLICATORFUL VAGINALLY 2 TIMES A WEEK (Patient taking differently: Place 1 Applicatorful vaginally once a week. )  . LIPITOR 40 MG tablet TAKE 1 TABLET BY MOUTH ONCE DAILY  . magnesium oxide (MAG-OX) 400 (241.3 Mg) MG tablet TAKE 1 TABLET BY MOUTH ONCE DAILY  . metoprolol tartrate (LOPRESSOR) 25 MG tablet Take 1 tablet (25 mg total) by mouth 2 (two) times daily.  . quinapril (ACCUPRIL) 40 MG tablet Take 1 tablet (40 mg total) by mouth daily.  Marland Kitchen. triamcinolone (NASACORT) 55 MCG/ACT nasal  inhaler Place 2 sprays into the nose daily as needed.    No facility-administered encounter medications on file as of 09/16/2020.    Allergies (verified) Patient has no known allergies.   History: Past Medical History:  Diagnosis Date  . Cystocele or rectocele with incomplete uterine prolapse   . Hyperlipidemia   . Hypertension   . Irritable bowel syndrome   . Palpitations   . Vaginal atrophy    Past Surgical History:  Procedure Laterality Date  . CATARACT EXTRACTION  2006   Family History  Problem Relation Age of Onset  . Transient ischemic attack Mother        multiple   . Dementia Mother   . Stroke Father   . Heart disease Brother        MI - died age 84  . Leukemia Brother   . Lung cancer Brother   . Throat cancer Brother        had heart disease also  . Heart disease Sister   . Dementia Sister    Social History   Socioeconomic History  . Marital status: Widowed    Spouse name: Not on file  . Number of children: 2  . Years of education: Not on file  . Highest education level: Not on file  Occupational History  . Not on file  Tobacco Use  . Smoking status: Never Smoker  . Smokeless tobacco: Never Used  Vaping Use  . Vaping  Use: Never used  Substance and Sexual Activity  . Alcohol use: No    Alcohol/week: 0.0 standard drinks  . Drug use: No  . Sexual activity: Not Currently    Birth control/protection: None  Other Topics Concern  . Not on file  Social History Narrative  . Not on file   Social Determinants of Health   Financial Resource Strain: Low Risk   . Difficulty of Paying Living Expenses: Not hard at all  Food Insecurity: No Food Insecurity  . Worried About Programme researcher, broadcasting/film/video in the Last Year: Never true  . Ran Out of Food in the Last Year: Never true  Transportation Needs: No Transportation Needs  . Lack of Transportation (Medical): No  . Lack of Transportation (Non-Medical): No  Physical Activity: Insufficiently Active  . Days of  Exercise per Week: 7 days  . Minutes of Exercise per Session: 20 min  Stress: No Stress Concern Present  . Feeling of Stress : Not at all  Social Connections: Unknown  . Frequency of Communication with Friends and Family: Not on file  . Frequency of Social Gatherings with Friends and Family: Not on file  . Attends Religious Services: Not on file  . Active Member of Clubs or Organizations: Not on file  . Attends Banker Meetings: Not on file  . Marital Status: Widowed    Tobacco Counseling Counseling given: Not Answered   Clinical Intake:  Pre-visit preparation completed: Yes        Diabetes: No  How often do you need to have someone help you when you read instructions, pamphlets, or other written materials from your doctor or pharmacy?: 1 - Never  Interpreter Needed?: No      Activities of Daily Living In your present state of health, do you have any difficulty performing the following activities: 09/16/2020  Hearing? Y  Comment Hearing aids  Vision? N  Difficulty concentrating or making decisions? N  Walking or climbing stairs? N  Dressing or bathing? N  Doing errands, shopping? N  Preparing Food and eating ? N  Using the Toilet? N  In the past six months, have you accidently leaked urine? Y  Comment Managed with daily liner  Do you have problems with loss of bowel control? N  Managing your Medications? N  Managing your Finances? N  Housekeeping or managing your Housekeeping? N  Some recent data might be hidden    Patient Care Team: Dale Concord, MD as PCP - General (Internal Medicine) Antonieta Iba, MD as PCP - Cardiology (Cardiology)  Indicate any recent Medical Services you may have received from other than [facility] providers in the past year (date may be approximate).     Assessment:   This is a routine wellness examination for Elizabeth [name].  I connected with Elizabeth [name] today by telephone and verified that I am speaking with the correct  person using two identifiers. Location patient: home Location provider: work Persons participating in the virtual visit: patient, Engineer, civil (consulting).    I discussed the limitations, risks, security and privacy concerns of performing an evaluation and management service by telephone and the availability of in person appointments. The patient expressed understanding and verbally consented to this telephonic visit.    Interactive audio and video telecommunications were attempted between this provider and patient, however failed, due to patient having technical difficulties OR patient did not have access to video capability.  We continued and completed visit with audio only.  Some vital signs may be  absent or patient reported.   Hearing/Vision screen No exam data present  Dietary issues and exercise activities discussed: Current Exercise Habits: Home exercise routine, Type of exercise: stretching;treadmill, Time (Minutes): 20, Frequency (Times/Week): 7, Weekly Exercise (Minutes/Week): 140, Intensity: Mild  Goals    . Increase physical activity     Use the treadmill as tolerated 30 minutes daily      Depression Screen PHQ 2/9 Scores 09/16/2020 09/16/2019 01/06/2019 05/28/2018 05/27/2017 09/28/2016 07/19/2015  PHQ - 2 Score 0 0 1 0 0 0 0  PHQ- 9 Score - - 3 - - - -    Fall Risk Fall Risk  09/16/2020 09/16/2019 06/25/2019 05/28/2018 05/27/2017  Falls in the past year? 0 0 0 No No  Number falls in past yr: 0 - - - -  Follow up Falls evaluation completed - - - -   Handrails in use when climbing stairs? Yes Home free of loose throw rugs in walkways, pet beds, electrical cords, etc? Yes  Adequate lighting in your home to reduce risk of falls? Yes   ASSISTIVE DEVICES UTILIZED TO PREVENT FALLS: Life alert? Yes  Use of a cane, walker or w/c? No  Grab bars in the bathroom? Yes   Shower chair or bench in shower? No  Elevated toilet seat or a handicapped toilet? No   TIMED UP AND GO: Was the test performed? No .   Virtual visit.   Cognitive Function: Patient is alert and oriented x3.  Denies difficulty focusing, making decisions, making decisions.  Enjoys painting and reading aloud for brain health.   MMSE - Mini Mental State Exam 05/28/2018  Orientation to time 5  Orientation to Place 5  Registration 3  Attention/ Calculation 5  Recall 3  Language- name 2 objects 2  Language- repeat 1  Language- follow 3 step command 3  Language- read & follow direction 1  Write a sentence 1  Copy design 1  Total score 30     6CIT Screen 09/16/2019 05/27/2017  What Year? 0 points 0 points  What month? 0 points 0 points  What time? 0 points 0 points  Count back from 20 0 points 0 points  Months in reverse 0 points 0 points  Repeat phrase 0 points 0 points  Total Score 0 0   Immunizations Immunization History  Administered Date(s) Administered  . Fluad Quad(high Dose 65+) 10/15/2019  . Influenza Split 10/19/2009, 10/13/2013, 09/30/2014  . Influenza, High Dose Seasonal PF 09/28/2016, 10/30/2017, 11/04/2018  . Influenza-Unspecified 10/24/2015  . Pneumococcal Conjugate-13 03/11/2014  . Pneumococcal Polysaccharide-23 05/31/2017   Health Maintenance Health Maintenance  Topic Date Due  . COVID-19 Vaccine (1) Never done  . TETANUS/TDAP  Never done  . DEXA SCAN  Never done  . INFLUENZA VACCINE  07/24/2020  . PNA vac Low Risk Adult  Completed  . MAMMOGRAM  Discontinued   Dental Screening: Recommended annual dental exams for proper oral hygiene. Visit every 6 months.   Community Resource Referral / Chronic Care Management: CRR required this visit?  No   CCM required this visit?  No      Plan:   Keep all routine maintenance appointments.   Cpe 09/20/20@ 1:30  I have personally reviewed and noted the following in the patient's chart:   . Medical and social history . Use of alcohol, tobacco or illicit drugs  . Current medications and supplements . Functional ability and status . Nutritional  status . Physical activity . Advanced directives . List of  other physicians . Hospitalizations, surgeries, and ER visits in previous 12 months . Vitals . Screenings to include cognitive, depression, and falls . Referrals and appointments  In addition, I have reviewed and discussed with patient certain preventive protocols, quality metrics, and best practice recommendations. A written personalized care plan for preventive services as well as general preventive health recommendations were provided to patient via mychart.     Ashok Pall, LPN   [id number]     Reviewed above information.  Agree with assessment and plan.    Dr Lorin Picket

## 2020-09-20 ENCOUNTER — Ambulatory Visit (INDEPENDENT_AMBULATORY_CARE_PROVIDER_SITE_OTHER): Payer: Medicare Other | Admitting: Internal Medicine

## 2020-09-20 ENCOUNTER — Other Ambulatory Visit: Payer: Self-pay

## 2020-09-20 ENCOUNTER — Encounter: Payer: Self-pay | Admitting: Internal Medicine

## 2020-09-20 VITALS — BP 126/70 | HR 71 | Temp 98.2°F | Resp 16 | Ht 61.0 in | Wt 137.4 lb

## 2020-09-20 DIAGNOSIS — F439 Reaction to severe stress, unspecified: Secondary | ICD-10-CM

## 2020-09-20 DIAGNOSIS — I10 Essential (primary) hypertension: Secondary | ICD-10-CM | POA: Diagnosis not present

## 2020-09-20 DIAGNOSIS — Z9109 Other allergy status, other than to drugs and biological substances: Secondary | ICD-10-CM

## 2020-09-20 DIAGNOSIS — R739 Hyperglycemia, unspecified: Secondary | ICD-10-CM

## 2020-09-20 DIAGNOSIS — I471 Supraventricular tachycardia: Secondary | ICD-10-CM

## 2020-09-20 DIAGNOSIS — N1831 Chronic kidney disease, stage 3a: Secondary | ICD-10-CM

## 2020-09-20 DIAGNOSIS — M25511 Pain in right shoulder: Secondary | ICD-10-CM

## 2020-09-20 DIAGNOSIS — Z960 Presence of urogenital implants: Secondary | ICD-10-CM

## 2020-09-20 DIAGNOSIS — Z0000 Encounter for general adult medical examination without abnormal findings: Secondary | ICD-10-CM

## 2020-09-20 DIAGNOSIS — E7800 Pure hypercholesterolemia, unspecified: Secondary | ICD-10-CM

## 2020-09-20 DIAGNOSIS — R7989 Other specified abnormal findings of blood chemistry: Secondary | ICD-10-CM

## 2020-09-20 NOTE — Progress Notes (Signed)
Patient ID: Elizabeth [name], female   DOB: July 22, 1927, 86 y.o.   MRN: [id number]   Subjective:    Patient ID: Elizabeth [name], female    DOB: [birth year], 86 y.o.   MRN: [id number]  HPI This visit occurred during the SARS-CoV-2 public health emergency.  Safety protocols were in place, including screening questions prior to the visit, additional usage of staff PPE, and extensive cleaning of exam room while observing appropriate contact time as indicated for disinfecting solutions.  Patient here for her physical exam.  She reports she is doing relatively well. Increased stress.  Living by herself, etc.  Discussed with her today.  Does not feel she needs any further intervention.  No chest pain or increased sob reported.  No abdominal pain.  Bowels stable.  Some occasional stuffiness and drainage.  Takes zyrtec, nasacort and uses neti pot.  Controls fairly well.  No chest congestion or cough.  Did injure her shoulder approximately one month ago.  Fell into the shower door.  Limited rom.  Is some better.  Discussed gentle exercises.  Desires no further intervention at this time.  Blood pressure ok. Discussed labs.  TSH slightly elevated.  Will recheck.    Past Medical History:  Diagnosis Date  . Cystocele or rectocele with incomplete uterine prolapse   . Hyperlipidemia   . Hypertension   . Irritable bowel syndrome   . Palpitations   . Vaginal atrophy    Past Surgical History:  Procedure Laterality Date  . CATARACT EXTRACTION  2006   Family History  Problem Relation Age of Onset  . Transient ischemic attack Mother        multiple   . Dementia Mother   . Stroke Father   . Heart disease Brother        MI - died age 36  . Leukemia Brother   . Lung cancer Brother   . Throat cancer Brother        had heart disease also  . Heart disease Sister   . Dementia Sister    Social History   Socioeconomic History  . Marital status: Widowed    Spouse name: Not on file  . Number of children: 2  .  Years of education: Not on file  . Highest education level: Not on file  Occupational History  . Not on file  Tobacco Use  . Smoking status: Never Smoker  . Smokeless tobacco: Never Used  Vaping Use  . Vaping Use: Never used  Substance and Sexual Activity  . Alcohol use: No    Alcohol/week: 0.0 standard drinks  . Drug use: No  . Sexual activity: Not Currently    Birth control/protection: None  Other Topics Concern  . Not on file  Social History Narrative  . Not on file   Social Determinants of Health   Financial Resource Strain: Low Risk   . Difficulty of Paying Living Expenses: Not hard at all  Food Insecurity: No Food Insecurity  . Worried About Charity fundraiser in the Last Year: Never true  . Ran Out of Food in the Last Year: Never true  Transportation Needs: No Transportation Needs  . Lack of Transportation (Medical): No  . Lack of Transportation (Non-Medical): No  Physical Activity: Insufficiently Active  . Days of Exercise per Week: 7 days  . Minutes of Exercise per Session: 20 min  Stress: No Stress Concern Present  . Feeling of Stress : Not at all  Social Connections: Unknown  .  Frequency of Communication with Friends and Family: Not on file  . Frequency of Social Gatherings with Friends and Family: Not on file  . Attends Religious Services: Not on file  . Active Member of Clubs or Organizations: Not on file  . Attends Archivist Meetings: Not on file  . Marital Status: Widowed    Outpatient Encounter Medications as of 09/20/2020  Medication Sig  . aspirin 81 MG tablet Take 81 mg by mouth daily.  . cetirizine (ZYRTEC) 5 MG tablet Take 5 mg by mouth daily as needed.   Marland Kitchen estradiol (ESTRACE) 0.1 MG/GM vaginal cream PLACE 1 APPLICATORFUL VAGINALLY 2 TIMES A WEEK (Patient taking differently: Place 1 Applicatorful vaginally once a week. )  . LIPITOR 40 MG tablet TAKE 1 TABLET BY MOUTH ONCE DAILY  . magnesium oxide (MAG-OX) 400 (241.3 Mg) MG tablet TAKE  1 TABLET BY MOUTH ONCE DAILY  . metoprolol tartrate (LOPRESSOR) 25 MG tablet Take 1 tablet (25 mg total) by mouth 2 (two) times daily.  . quinapril (ACCUPRIL) 40 MG tablet Take 1 tablet (40 mg total) by mouth daily.  Marland Kitchen triamcinolone (NASACORT) 55 MCG/ACT nasal inhaler Place 2 sprays into the nose daily as needed.    No facility-administered encounter medications on file as of 09/20/2020.    Review of Systems  Constitutional: Negative for appetite change and unexpected weight change.  HENT: Negative for congestion and sinus pressure.   Eyes: Negative for pain and visual disturbance.  Respiratory: Negative for cough, chest tightness and shortness of breath.   Cardiovascular: Negative for chest pain, palpitations and leg swelling.  Gastrointestinal: Negative for abdominal pain, constipation and diarrhea.  Genitourinary: Negative for difficulty urinating and dysuria.  Musculoskeletal: Negative for joint swelling and myalgias.       Previous right shoulder injury as outlined.    Skin: Negative for color change and rash.  Neurological: Negative for dizziness, light-headedness and headaches.  Hematological: Negative for adenopathy. Does not bruise/bleed easily.  Psychiatric/Behavioral: Negative for agitation and dysphoric mood.       Objective:    Physical Exam  BP 126/70   Pulse 71   Temp 98.2 F (36.8 C) (Oral)   Resp 16   Ht '5\' 1"'  (1.549 m)   Wt 137 lb 6.4 oz (62.3 kg)   SpO2 98%   BMI 25.96 kg/m  Wt Readings from Last 3 Encounters:  09/20/20 137 lb 6.4 oz (62.3 kg)  09/16/20 139 lb (63 kg)  05/19/20 139 lb (63 kg)     Lab Results  Component Value Date   WBC 6.4 09/15/2020   HGB 14.1 09/15/2020   HCT 42.1 09/15/2020   PLT 219.0 09/15/2020   GLUCOSE 78 09/15/2020   CHOL 149 09/15/2020   TRIG 96.0 09/15/2020   HDL 48.30 09/15/2020   LDLDIRECT 178.4 11/04/2013   LDLCALC 81 09/15/2020   ALT 16 09/15/2020   AST 18 09/15/2020   NA 137 09/15/2020   K 4.6 09/15/2020    CL 104 09/15/2020   CREATININE 1.04 09/15/2020   BUN 23 09/15/2020   CO2 24 09/15/2020   TSH 4.91 (H) 09/15/2020   HGBA1C 6.7 (H) 09/15/2020    DG Chest 2 View  Result Date: 07/31/2019 CLINICAL DATA:  86 year old female with history of pain under the left shoulder blade radiating into the left arm for the past 2 days. EXAM: CHEST - 2 VIEW COMPARISON:  Chest x-ray 11/04/2018. FINDINGS: Lung volumes are normal. No consolidative airspace disease. No pleural effusions.  No pneumothorax. No pulmonary nodule or mass noted. Pulmonary vasculature and the cardiomediastinal silhouette are within normal limits. Atherosclerosis in the thoracic aorta. IMPRESSION: 1.  No radiographic evidence of acute cardiopulmonary disease. 2. Aortic atherosclerosis. Electronically Signed   By: Vinnie Langton M.D.   On: 07/31/2019 12:13       Assessment & Plan:   Problem List Items Addressed This Visit    Vaginal pessary in situ    Followed by gyn.       SVT (supraventricular tachycardia) (HCC)    On metoprolol.  Stable.  Followed by cardiology.       Stress    Increased stress as outlined.  Discussed with her today.  Does not feel needs any further intervention.  Follow.       Right shoulder pain    S/p injury as outlined.  Is some better.  Gentle exercises.  Desires no further w/up or evaluation.       Hypertension    Blood pressure as outlined. Doing well on quinapril and metoprolol.  Hold on making any changes.  Follow pressures.  Follow metabolic panel.       Hyperglycemia    Low carb diet and exercise.  Follow met b and a1c.       Hypercholesterolemia    On lipitor.  Low cholesterol diet and exercise. Follow lipid panel and liver function tests.        Health care maintenance    Physical today 09/20/20.  Declines mammogram.       Environmental allergies    Relatively stable - on zyrtec, nasacort and using neti pot.       Elevated TSH    Elevated tsh on last check - slight.  Recheck tsh  in 6 weeks.        Relevant Orders   TSH   CKD (chronic kidney disease) stage 3, GFR 30-59 ml/min (HCC)    Avoid antiinflammatories.  Stay hydrated.  Recheck met b.        Relevant Orders   Basic metabolic panel   Urinalysis, Routine w reflex microscopic       Einar Pheasant, MD

## 2020-09-20 NOTE — Assessment & Plan Note (Addendum)
Physical today 09/20/20.  Declines mammogram.

## 2020-10-02 ENCOUNTER — Encounter: Payer: Self-pay | Admitting: Internal Medicine

## 2020-10-02 DIAGNOSIS — R7989 Other specified abnormal findings of blood chemistry: Secondary | ICD-10-CM | POA: Insufficient documentation

## 2020-10-02 DIAGNOSIS — N183 Chronic kidney disease, stage 3 unspecified: Secondary | ICD-10-CM | POA: Insufficient documentation

## 2020-10-02 DIAGNOSIS — M25511 Pain in right shoulder: Secondary | ICD-10-CM | POA: Insufficient documentation

## 2020-10-02 NOTE — Assessment & Plan Note (Signed)
Elevated tsh on last check - slight.  Recheck tsh in 6 weeks.

## 2020-10-02 NOTE — Assessment & Plan Note (Signed)
Relatively stable - on zyrtec, nasacort and using neti pot.

## 2020-10-02 NOTE — Assessment & Plan Note (Signed)
Followed by gyn

## 2020-10-02 NOTE — Assessment & Plan Note (Signed)
Increased stress as outlined.  Discussed with her today.  Does not feel needs any further intervention.  Follow.   

## 2020-10-02 NOTE — Assessment & Plan Note (Signed)
S/p injury as outlined.  Is some better.  Gentle exercises.  Desires no further w/up or evaluation.

## 2020-10-02 NOTE — Assessment & Plan Note (Signed)
On metoprolol.  Stable.  Followed by cardiology.

## 2020-10-02 NOTE — Assessment & Plan Note (Signed)
Low carb diet and exercise.  Follow met b and a1c.  

## 2020-10-02 NOTE — Assessment & Plan Note (Signed)
Avoid antiinflammatories.  Stay hydrated.  Recheck met b.

## 2020-10-02 NOTE — Assessment & Plan Note (Signed)
On lipitor.  Low cholesterol diet and exercise.  Follow lipid panel and liver function tests.   

## 2020-10-02 NOTE — Assessment & Plan Note (Signed)
Blood pressure as outlined. Doing well on quinapril and metoprolol.  Hold on making any changes.  Follow pressures.  Follow metabolic panel.

## 2020-11-01 ENCOUNTER — Other Ambulatory Visit: Payer: Self-pay

## 2020-11-01 ENCOUNTER — Other Ambulatory Visit (INDEPENDENT_AMBULATORY_CARE_PROVIDER_SITE_OTHER): Payer: Medicare Other

## 2020-11-01 DIAGNOSIS — N1831 Chronic kidney disease, stage 3a: Secondary | ICD-10-CM | POA: Diagnosis not present

## 2020-11-01 DIAGNOSIS — R7989 Other specified abnormal findings of blood chemistry: Secondary | ICD-10-CM | POA: Diagnosis not present

## 2020-11-01 LAB — BASIC METABOLIC PANEL
BUN: 26 mg/dL — ABNORMAL HIGH (ref 6–23)
CO2: 25 mEq/L (ref 19–32)
Calcium: 9.4 mg/dL (ref 8.4–10.5)
Chloride: 103 mEq/L (ref 96–112)
Creatinine, Ser: 1.21 mg/dL — ABNORMAL HIGH (ref 0.40–1.20)
GFR: 39.11 mL/min — ABNORMAL LOW (ref >60.00)
Glucose, Bld: 102 mg/dL — ABNORMAL HIGH (ref 70–99)
Potassium: 5 mEq/L (ref 3.5–5.1)
Sodium: 136 mEq/L (ref 135–145)

## 2020-11-01 LAB — TSH: TSH: 3.34 u[IU]/mL (ref 0.35–4.50)

## 2020-11-03 ENCOUNTER — Other Ambulatory Visit: Payer: Self-pay | Admitting: Internal Medicine

## 2020-11-03 DIAGNOSIS — R944 Abnormal results of kidney function studies: Secondary | ICD-10-CM

## 2020-11-03 NOTE — Progress Notes (Signed)
Orders placed for f/u labs.  

## 2020-11-09 ENCOUNTER — Other Ambulatory Visit: Payer: Self-pay

## 2020-11-09 ENCOUNTER — Other Ambulatory Visit (INDEPENDENT_AMBULATORY_CARE_PROVIDER_SITE_OTHER): Payer: Medicare Other

## 2020-11-09 DIAGNOSIS — R944 Abnormal results of kidney function studies: Secondary | ICD-10-CM

## 2020-11-09 LAB — BASIC METABOLIC PANEL
BUN: 18 mg/dL (ref 6–23)
CO2: 23 mEq/L (ref 19–32)
Calcium: 8.6 mg/dL (ref 8.4–10.5)
Chloride: 104 mEq/L (ref 96–112)
Creatinine, Ser: 1.02 mg/dL (ref 0.40–1.20)
GFR: 48 mL/min — ABNORMAL LOW (ref >60.00)
Glucose, Bld: 95 mg/dL (ref 70–99)
Potassium: 4.6 mEq/L (ref 3.5–5.1)
Sodium: 135 mEq/L (ref 135–145)

## 2020-11-09 NOTE — Addendum Note (Signed)
Addended by: Warden Fillers on: 11/09/2020 11:49 AM   Modules accepted: Orders

## 2020-11-10 ENCOUNTER — Other Ambulatory Visit (INDEPENDENT_AMBULATORY_CARE_PROVIDER_SITE_OTHER): Payer: Medicare Other

## 2020-11-10 ENCOUNTER — Other Ambulatory Visit: Payer: Self-pay

## 2020-11-10 DIAGNOSIS — R944 Abnormal results of kidney function studies: Secondary | ICD-10-CM | POA: Diagnosis not present

## 2020-11-10 LAB — URINALYSIS, ROUTINE W REFLEX MICROSCOPIC
Bilirubin Urine: NEGATIVE
Hgb urine dipstick: NEGATIVE
Ketones, ur: NEGATIVE
Nitrite: NEGATIVE
Specific Gravity, Urine: 1.02 (ref 1.000–1.030)
Total Protein, Urine: NEGATIVE
Urine Glucose: NEGATIVE
Urobilinogen, UA: 0.2 (ref 0.0–1.0)
pH: 6 (ref 5.0–8.0)

## 2020-11-11 ENCOUNTER — Other Ambulatory Visit: Payer: Self-pay | Admitting: Internal Medicine

## 2020-11-11 ENCOUNTER — Other Ambulatory Visit: Payer: Medicare Other

## 2020-11-11 DIAGNOSIS — R8281 Pyuria: Secondary | ICD-10-CM

## 2020-11-11 NOTE — Progress Notes (Signed)
Order placed for f/u urine culture.   

## 2020-11-13 LAB — URINE CULTURE
MICRO NUMBER:: 11226714
SPECIMEN QUALITY:: ADEQUATE

## 2020-11-28 ENCOUNTER — Other Ambulatory Visit: Payer: Self-pay | Admitting: Internal Medicine

## 2020-12-29 ENCOUNTER — Other Ambulatory Visit: Payer: Self-pay | Admitting: Internal Medicine

## 2020-12-29 ENCOUNTER — Other Ambulatory Visit: Payer: Self-pay | Admitting: Cardiovascular Disease

## 2020-12-29 NOTE — Telephone Encounter (Signed)
Please contact pt for future appointment. Pt due for 12 month f/u. 

## 2020-12-29 NOTE — Telephone Encounter (Signed)
Scheduled for 1/7.

## 2020-12-30 ENCOUNTER — Ambulatory Visit (INDEPENDENT_AMBULATORY_CARE_PROVIDER_SITE_OTHER): Payer: Medicare Other | Admitting: Physician Assistant

## 2020-12-30 ENCOUNTER — Other Ambulatory Visit: Payer: Self-pay

## 2020-12-30 ENCOUNTER — Encounter: Payer: Self-pay | Admitting: Physician Assistant

## 2020-12-30 VITALS — BP 144/74 | HR 70 | Ht 61.0 in | Wt 138.0 lb

## 2020-12-30 DIAGNOSIS — I471 Supraventricular tachycardia: Secondary | ICD-10-CM | POA: Diagnosis not present

## 2020-12-30 DIAGNOSIS — E782 Mixed hyperlipidemia: Secondary | ICD-10-CM

## 2020-12-30 DIAGNOSIS — R002 Palpitations: Secondary | ICD-10-CM

## 2020-12-30 DIAGNOSIS — I10 Essential (primary) hypertension: Secondary | ICD-10-CM | POA: Diagnosis not present

## 2020-12-30 DIAGNOSIS — I872 Venous insufficiency (chronic) (peripheral): Secondary | ICD-10-CM

## 2020-12-30 DIAGNOSIS — R0602 Shortness of breath: Secondary | ICD-10-CM

## 2020-12-30 DIAGNOSIS — R0600 Dyspnea, unspecified: Secondary | ICD-10-CM

## 2020-12-30 MED ORDER — METOPROLOL TARTRATE 25 MG PO TABS: 25.0000 mg | ORAL_TABLET | Freq: Two times a day (BID) | ORAL | 3 refills | Status: DC

## 2020-12-30 MED ORDER — QUINAPRIL HCL 40 MG PO TABS: 40.0000 mg | ORAL_TABLET | Freq: Every day | ORAL | 3 refills | Status: DC

## 2020-12-30 NOTE — Progress Notes (Signed)
Office Visit    Patient Name: Elizabeth [name] Date of Encounter: 12/30/2020  Primary Care Provider:  Dale Pandora, MD Primary Cardiologist:  Julien Nordmann, MD  Chief Complaint    Chief Complaint  Patient presents with   Other    12 month follow up. Patient c.o fluid in ankles.  Meds reviewed verbally with patient.     87 year old female with history of SVT, hypertension, hyperlipidemia, hyponatremia, vertigo, and IBS, and who presents for 1 year follow-up of SVT.  Past Medical History    Past Medical History:  Diagnosis Date   Cystocele or rectocele with incomplete uterine prolapse    Hyperlipidemia    Hypertension    Irritable bowel syndrome    Palpitations    Vaginal atrophy    Past Surgical History:  Procedure Laterality Date   CATARACT EXTRACTION  2006    Allergies  No Known Allergies  History of Present Illness    Elizabeth [name] is a 87 y.o. female with PMH as above.  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  She previously reported undergoing stress testing that was abnormal, and echo for murmur that showed valvular calcification, and Holter monitoring that was unrevealing.  She has a self-reported long history of palpitations for over 20 years, previously maintained on propanolol.  She was seen 10/2018 with report of shortness of breath that occurred randomly and was further described as feeling the need to take a couple of deep breaths.  She denied dyspnea.  She noted more frequent or noticeable palpitations.  She had been more sedentary following the death of her husband.  Echo 12/20/18 showed EF 60 to 65%, trivial AI, mild MR, PASP 33 mmHg.  Cardiac monitoring showed overall NSR with average rate 77 bpm and 59 atrial tach/SVT runs with the fastest interval lasting 5 beats and maximum rate of 226 bpm.  The longest interval lasted 16 beats with average heart rate 108 bpm.  There were some episodes of SVT with possible  aberrancy.  SVT was detected with triggered events.  She was placed on Lopressor 25 mg twice daily and as needed propanolol was stopped.  She was seen 12-20-18 with ischemic testing deferred.  She was seen in the ED 07/2019 with chest pain ruled atypical.  She was seen in office 10/26/2019.  She noted a slight increase in palpitations, described as fluttering, and occurring 3-4 times per week and lasting for several seconds to minutes with spontaneous resolution.  She continued to live a sedentary lifestyle following the death of her husband and in the setting of the COVID-19 pandemic.  She is walking on her treadmill for 30 minutes each day without symptoms of chest pain or shortness of breath.  Shortness of breath typically only occurred when sitting down to read a book or drinking coffee and was described as an involuntary deep inhalation.  BP 130s to 140s systolic.  She did note frequently forgetting to take her Accupril.  She remained anxious.  14-day ZIO monitoring was repeated given her palpitations.  Possible echo was recommended or Lexiscan Myoview with patient preference to defer.  Accupril was recommended in the morning rather than midday to improve compliance.  She was last seen by her primary cardiologist 12-21-19.  She reported short runs of SVT/atrial tachycardia.  She declined propanolol.  Today, 12/30/2020, she returns to clinic and notes that she is overall doing well from a cardiac standpoint. She reports  ongoing palpitations, though noted to feel different since starting on Lopressor. She states that her palpitations do not feel better or worse, but rather just different when compared with how the feel on other rate controlling medications but unable to specify which medications. She denies chest pain or shortness of breath with exertion; however, she also states that she does not exert herself. She notes frustration with an increasingly sedentary lifestyle 2/2 COVID-19 and also with stressors in her  life. She notes some shortness of breath at rest, similar to that reported in the past. Mainly, she reports shortness of breath in bed and most often on her left side. Shortness of breath is described as feeling as if she needs to take a deeper breath. She states that it occurs almost as if she forgets to breathe. She is uncertain if it is worse when laying flat. It does occur more when she is lying on her left side. She reports using 3 pillows for comfort, as they are down pillows. She reportedly does not sleep as well as she used to, often waking at midnight (but not due to PND) and not going back to sleep till 4 AM. She bought a blood pressure cuff, though she does not use it and notes that it is out of batteries and does not feel it is taking accurate measurements. Wt down from that of previous clinic wt.  She reports mild lower extremity edema that usually resolves with elevation. Asymmetric nonpitting edema noted on exam, which she reports is chronic with RLE>LLE. She denies any signs or symptoms of DVT, including palpable cord, erythema, or warmth. Bilateral lower extremity skin color changes noted in the dependent edema, resolving with leg elevation. She denies any symptoms of claudication. No paresthesias. No presyncope or syncope. No early satiety, abdominal distention. She denies any signs or symptoms of bleeding. She reports she is trying to eat healthy and with less sugar. She reports her PCP is monitoring her thyroid closely. BP noted to be increased from that of previous clinic visit, attributed to not taking her Lopressor today.   Home Medications    Current Outpatient Medications on File Prior to Visit  Medication Sig Dispense Refill   aspirin 81 MG tablet Take 81 mg by mouth daily.     cetirizine (ZYRTEC) 5 MG tablet Take 5 mg by mouth daily as needed.      estradiol (ESTRACE) 0.1 MG/GM vaginal cream PLACE 1 APPLICATORFUL VAGINALLY 2 TIMES A WEEK 42.5 g 2   LIPITOR 40 MG tablet TAKE 1  TABLET BY MOUTH ONCE DAILY 90 tablet 3   magnesium oxide (MAG-OX) 400 (241.3 Mg) MG tablet TAKE 1 TABLET BY MOUTH ONCE DAILY 30 tablet 6   metoprolol tartrate (LOPRESSOR) 25 MG tablet TAKE 1 TABLET BY MOUTH TWICE DAILY 60 tablet 0   quinapril (ACCUPRIL) 40 MG tablet TAKE 1 TABLET BY MOUTH ONCE DAILY 90 tablet 1   triamcinolone (NASACORT) 55 MCG/ACT nasal inhaler Place 2 sprays into the nose daily as needed.      No current facility-administered medications on file prior to visit.    Review of Systems    She denies chest pain, pnd, n, v, dizziness, syncope, or early satiety. She reports ongoing palpitations, though these reportedly feel different since starting on her metoprolol. She is unable to describe the reason that they feel different on metoprolol. She reports some minimal LEE, which improves with elevation.she reports feeling the need to take a deep breath or as if  she forgets to breathe, and that this is worse when on left side. She is using 3 pillows, but this is attributed to comfort given they are down pillows, rather than orthopnea. She reports waking suddenly at night, though not due to PND. She sleeps poorly. She notes stress surrounding COVID-19 and its restrictions, She notes frustration with her sedentary lifestyle. Weight loss noted on clinic visit today. She denies any symptoms consistent with claudication. No symptoms of bleeding. No signs or symptoms consistent with DVT. She reports chronic asymmetric edema, which is noted on exam. All other systems reviewed and are otherwise negative except as noted above.  Physical Exam    VS:  BP (!) 144/74 (BP Location: Left Arm, Patient Position: Sitting, Cuff Size: Normal)    Pulse 70    Ht 5\' 1"  (1.549 m)    Wt 138 lb (62.6 kg)    SpO2 98%    BMI 26.07 kg/m  , BMI Body mass index is 26.07 kg/m. GEN: Well nourished, well developed, in no acute distress. HEENT: normal. Neck: Supple, no JVD, carotid bruits, or masses. Cardiac: RRR,  no murmurs, rubs, or gallops. No clubbing, cyanosis, moderate nonpitting edema slightly worse on the right, chronic.  Radials/DP/PT 2+ and equal bilaterally.  Respiratory:  Respirations regular and unlabored, clear to auscultation bilaterally. GI: Soft, nontender, nondistended, BS + x 4. MS: no deformity or atrophy. Skin: warm and dry, no rash. Dependent rubor / skin color changes noted, resolving with elevation. Neuro:  Strength and sensation are intact. Psych: Normal affect.  Accessory Clinical Findings    ECG personally reviewed by me today - NSR, 70 bpm, left axis deviation and poor R wave progression in V1 to V3 as seen in prior EKGs, poor R wave progression and inferior to to aVF as seen in prior EKGs, no acute changes- no acute changes.  VITALS Reviewed today   Temp Readings from Last 3 Encounters:  09/20/20 98.2 F (36.8 C) (Oral)  05/19/20 97.8 F (36.6 C)  10/15/19 (!) 95.3 F (35.2 C)   BP Readings from Last 3 Encounters:  12/30/20 (!) 144/74  09/20/20 126/70  05/19/20 124/72   Pulse Readings from Last 3 Encounters:  12/30/20 70  09/20/20 71  05/19/20 74    Wt Readings from Last 3 Encounters:  12/30/20 138 lb (62.6 kg)  09/20/20 137 lb 6.4 oz (62.3 kg)  09/16/20 139 lb (63 kg)     LABS  reviewed today   Lab Results  Component Value Date   WBC 6.4 09/15/2020   HGB 14.1 09/15/2020   HCT 42.1 09/15/2020   MCV 93.2 09/15/2020   PLT 219.0 09/15/2020   Lab Results  Component Value Date   CREATININE 1.02 11/09/2020   BUN 18 11/09/2020   NA 135 11/09/2020   K 4.6 11/09/2020   CL 104 11/09/2020   CO2 23 11/09/2020   Lab Results  Component Value Date   ALT 16 09/15/2020   AST 18 09/15/2020   ALKPHOS 62 09/15/2020   BILITOT 0.5 09/15/2020   Lab Results  Component Value Date   CHOL 149 09/15/2020   HDL 48.30 09/15/2020   LDLCALC 81 09/15/2020   LDLDIRECT 178.4 11/04/2013   TRIG 96.0 09/15/2020   CHOLHDL 3 09/15/2020    Lab Results  Component  Value Date   HGBA1C 6.7 (H) 09/15/2020   Lab Results  Component Value Date   TSH 3.34 11/01/2020     STUDIES/PROCEDURES reviewed today   Monitor 10/2019  avg HR of 64 bpm. Sinus Rhythm.  61 Supraventricular Tachycardia/atrial tachycardia runs occurred, the run with the fastest interval lasting 7 beats with a max rate of 218 bpm, the longest lasting 12.6 secs with an avg rate of 148 bpm.  Isolated SVEs were rare (<1.0%), SVE Couplets were rare (<1.0%), and SVE Triplets were rare (<1.0%). Isolated VEs were rare (<1.0%), and no VE Couplets or VE Triplets were present. Patient triggered events were associated with normal sinus rhythm, rarely with atrial tachycardia episode.  Echo 2019 - Left ventricle: The cavity size was normal. Systolic function was  normal. The estimated ejection fraction was in the range of 60%  to 65%. Wall motion was normal; there were no regional wall  motion abnormalities. Left ventricular diastolic function  parameters were normal.  - Aortic valve: There was trivial regurgitation.  - Mitral valve: There was mild regurgitation.  - Left atrium: The atrium was normal in size.  - Right ventricle: Systolic function was normal.  - Pulmonary arteries: Systolic pressure was mildly elevated. PA  peak pressure: 33 mm Hg (S).   Assessment & Plan    Paroxysmal SVT/AT -She reports ongoing palpitations that are not better or worse from her previous visit and reportedly different on metoprolol than other rate controlling medications; however, she is unable to describe how her palpitations are different on what medications. 10/2019 cardiac monitoring showed 61 runs of SVT, maximum rate 218 bpm and longest 12.6 seconds. Ectopy was also noted. Most triggered events occurred with NSR and rare atrial tachycardia episode. Previously, verapamil had been recommended after cardiac monitoring, but it does not appear that she started on this medication. Previous echo as above  with nl EF. Update echo to reassess EF and wall motion, as well as rule out acute structural changes. She continues to defer MPI. Recent potassium WNL with stable renal function. Her TSH is followed closely by her PCP. At RTC, and pending echo results, consider referral to EP for possible ablation. For now, continue current Lopressor. Of note, she did not take her Lopressor this morning. Encouraged to take her Lopressor before her next clinic visit.  Dyspnea - On review of previous notes, dyspnea appears unchanged from previous documentation. Euvolemic and well compensated on exam. Wt down from previous clinic visit with Dr. Mariah Milling. She has become increasingly sedentary when physical activities compared with that of previous notes. 2019 echo as above with EF 60 to 65%, PASP 33 mmHg. Given her report of ongoing difficulty breathing, we will update her 2019 echo. She continues to defer further ischemic testing at this time. Further recommendations pending MPI. Suspect at least some level of deconditioning, given her overall sedentary lifestyle, which has progressed over the last year to 2 years, as well as her overall level of anxiety. Encouraged increased activity/exercise and reviewed recommendations for diet.  LE edema, history of venous insufficiency --No significant edema on exam and reports LEE resolves with leg elevation. Wt decreased from previous clinic visit. No other reported s/sx of volume overload reported. Edema most consistent with dependent edema. Previous echo as above. Will update echo to reassess EF, WM, heart pressures. Recommended leg elevation and compression stockings. Encouraged increased activity. Recommended avoiding salt and total salt under 2g daily and fluid under 2L daily. Reassess at RTC.   HTN, goal BP 130/80 or lower -- BP elevated today at 144/74. She reports that she did not take her Lopressor this morning. Suspect elevated BP is in the setting of missed BB  and  stress/anxiety. Encouraged to take her beta-blocker prior to her upcoming appointments. Continue current ACE. At RTC, if BP remains elevated, can consider medication changes at that time. Encouraged increased activity and reviewed limits for total fluid and salt intake each day. Encouraged daily BP log. Encouraged daily weights.  HLD, goal LDL below 100 -- 08/2020 LDL 81. Discussed recommendation for ongoing lifestyle and diet changes, as well as repeat labs to monitor. For now, continue current statin.   Elevated Hgb A1C --Per PCP.   Medication changes: None Labs ordered: None Studies / Imaging ordered: Echo Future considerations: LE studies, MPI, EP Disposition: RTC after echo    Lennon Alstrom, PA-C 12/30/2020

## 2020-12-30 NOTE — Patient Instructions (Signed)
Medication Instructions:   Your physician recommends that you continue on your current medications as directed. Please refer to the Current Medication list given to you today.  *If you need a refill on your cardiac medications before your next appointment, please call your pharmacy*   Lab Work: None ordered    Testing/Procedures:  Your physician has requested that you have an echocardiogram. Echocardiography is a painless test that uses sound waves to create images of your heart. It provides your doctor with information about the size and shape of your heart and how well your heart's chambers and valves are working. This procedure takes approximately one hour. There are no restrictions for this procedure.   Follow-Up: At [facility], you and your health needs are our priority.  As part of our continuing mission to provide you with exceptional heart care, we have created designated Provider Care Teams.  These Care Teams include your primary Cardiologist (physician) and Advanced Practice Providers (APPs -  Physician Assistants and Nurse Practitioners) who all work together to provide you with the care you need, when you need it.  We recommend signing up for the patient portal called "MyChart".  Sign up information is provided on this After Visit Summary.  MyChart is used to connect with patients for Virtual Visits (Telemedicine).  Patients are able to view lab/test results, encounter notes, upcoming appointments, etc.  Non-urgent messages can be sent to your provider as well.   To learn more about what you can do with MyChart, go to [email or web address].    Your next appointment:   1 year(s)  The format for your next appointment:   In Person  Provider:   You may see Julien Nordmann, MD or one of the following Advanced Practice Providers on your designated Care Team:     Marisue Ivan, PA-C    Other Instructions  Please monitor your blood pressure occasionally at home.  Call your doctor if you tend to get readings of greater than 130 on the top number or 80 on the bottom number.

## 2021-01-19 ENCOUNTER — Ambulatory Visit (INDEPENDENT_AMBULATORY_CARE_PROVIDER_SITE_OTHER): Payer: Medicare Other

## 2021-01-19 ENCOUNTER — Other Ambulatory Visit: Payer: Self-pay

## 2021-01-19 DIAGNOSIS — R0602 Shortness of breath: Secondary | ICD-10-CM

## 2021-01-19 LAB — ECHOCARDIOGRAM COMPLETE
Area-P 1/2: 3.66 cm2
MV M vel: 4.54 m/s
MV Peak grad: 82.4 mmHg
P 1/2 time: 604 msec
S' Lateral: 2.1 cm

## 2021-01-23 ENCOUNTER — Ambulatory Visit: Payer: Medicare Other | Admitting: Internal Medicine

## 2021-01-23 ENCOUNTER — Encounter: Payer: Self-pay | Admitting: Internal Medicine

## 2021-01-23 ENCOUNTER — Other Ambulatory Visit: Payer: Self-pay

## 2021-01-23 DIAGNOSIS — R0602 Shortness of breath: Secondary | ICD-10-CM | POA: Diagnosis not present

## 2021-01-23 DIAGNOSIS — F439 Reaction to severe stress, unspecified: Secondary | ICD-10-CM

## 2021-01-23 DIAGNOSIS — I471 Supraventricular tachycardia: Secondary | ICD-10-CM

## 2021-01-23 DIAGNOSIS — E7800 Pure hypercholesterolemia, unspecified: Secondary | ICD-10-CM

## 2021-01-23 DIAGNOSIS — R531 Weakness: Secondary | ICD-10-CM | POA: Diagnosis not present

## 2021-01-23 DIAGNOSIS — E1165 Type 2 diabetes mellitus with hyperglycemia: Secondary | ICD-10-CM

## 2021-01-23 DIAGNOSIS — I10 Essential (primary) hypertension: Secondary | ICD-10-CM

## 2021-01-23 NOTE — Progress Notes (Signed)
Patient ID: Elizabeth [name], female   DOB: [birth year], 87 y.o.   MRN: [id number]   Subjective:    Patient ID: Elizabeth [name], female    DOB: 12/18/1927, 87 y.o.   MRN: [id number]  HPI This visit occurred during the SARS-CoV-2 public health emergency.  Safety protocols were in place, including screening questions prior to the visit, additional usage of staff PPE, and extensive cleaning of exam room while observing appropriate contact time as indicated for disinfecting solutions.  Patient here for a scheduled follow up.  Here to follow up regarding her blood pressure and palpitations.  Seeing cardiology.  Cardiac monitoring revealed NSR with average rate 77 with noted atrial tach/SVT runs.  Placed on lopressor.  Continues on lopressor.  Blood pressure averaging 120s/70s.  Still will notice occasional palpitations, but overall seems to be stable.  Had ECHO recently to f/u regarding stable sob with exertion.  Feels related to not being as active.  Reports not as active.  Decreased strength.  Discussed PT - for core strengthening.  Eating.  No nausea or vomiting.  Bowels moving.    Past Medical History:  Diagnosis Date  . Cystocele or rectocele with incomplete uterine prolapse   . Hyperlipidemia   . Hypertension   . Irritable bowel syndrome   . Palpitations   . Vaginal atrophy    Past Surgical History:  Procedure Laterality Date  . CATARACT EXTRACTION  2006   Family History  Problem Relation Age of Onset  . Transient ischemic attack Mother        multiple   . Dementia Mother   . Stroke Father   . Heart disease Brother        MI - died age 85  . Leukemia Brother   . Lung cancer Brother   . Throat cancer Brother        had heart disease also  . Heart disease Sister   . Dementia Sister    Social History   Socioeconomic History  . Marital status: Widowed    Spouse name: Not on file  . Number of children: 2  . Years of education: Not on file  . Highest education level: Not on file   Occupational History  . Not on file  Tobacco Use  . Smoking status: Never Smoker  . Smokeless tobacco: Never Used  Vaping Use  . Vaping Use: Never used  Substance and Sexual Activity  . Alcohol use: No    Alcohol/week: 0.0 standard drinks  . Drug use: No  . Sexual activity: Not Currently    Birth control/protection: None  Other Topics Concern  . Not on file  Social History Narrative  . Not on file   Social Determinants of Health   Financial Resource Strain: Low Risk   . Difficulty of Paying Living Expenses: Not hard at all  Food Insecurity: No Food Insecurity  . Worried About Charity fundraiser in the Last Year: Never true  . Ran Out of Food in the Last Year: Never true  Transportation Needs: No Transportation Needs  . Lack of Transportation (Medical): No  . Lack of Transportation (Non-Medical): No  Physical Activity: Insufficiently Active  . Days of Exercise per Week: 7 days  . Minutes of Exercise per Session: 20 min  Stress: No Stress Concern Present  . Feeling of Stress : Not at all  Social Connections: Unknown  . Frequency of Communication with Friends and Family: Not on file  . Frequency of Social  Gatherings with Friends and Family: Not on file  . Attends Religious Services: Not on file  . Active Member of Clubs or Organizations: Not on file  . Attends Archivist Meetings: Not on file  . Marital Status: Widowed    Outpatient Encounter Medications as of 01/23/2021  Medication Sig  . aspirin 81 MG tablet Take 81 mg by mouth daily.  . cetirizine (ZYRTEC) 5 MG tablet Take 5 mg by mouth daily as needed.   Marland Kitchen estradiol (ESTRACE) 0.1 MG/GM vaginal cream PLACE 1 APPLICATORFUL VAGINALLY 2 TIMES A WEEK  . LIPITOR 40 MG tablet TAKE 1 TABLET BY MOUTH ONCE DAILY  . magnesium oxide (MAG-OX) 400 (241.3 Mg) MG tablet TAKE 1 TABLET BY MOUTH ONCE DAILY  . metoprolol tartrate (LOPRESSOR) 25 MG tablet Take 1 tablet (25 mg total) by mouth 2 (two) times daily.  . quinapril  (ACCUPRIL) 40 MG tablet Take 1 tablet (40 mg total) by mouth daily.  Marland Kitchen triamcinolone (NASACORT) 55 MCG/ACT nasal inhaler Place 2 sprays into the nose daily as needed.    No facility-administered encounter medications on file as of 01/23/2021.    Review of Systems  Constitutional: Negative for appetite change and unexpected weight change.  HENT: Negative for congestion and sinus pressure.   Respiratory: Negative for cough and chest tightness.        Breathing stable.   Cardiovascular: Negative for chest pain, palpitations and leg swelling.  Gastrointestinal: Negative for abdominal pain, diarrhea, nausea and vomiting.  Genitourinary: Negative for difficulty urinating and dysuria.  Musculoskeletal: Negative for joint swelling and myalgias.  Skin: Negative for color change and rash.  Neurological: Negative for dizziness, light-headedness and headaches.  Psychiatric/Behavioral: Negative for agitation and dysphoric mood.       Objective:    Physical Exam Vitals reviewed.  Constitutional:      General: She is not in acute distress.    Appearance: Normal appearance.  HENT:     Head: Normocephalic and atraumatic.     Right Ear: External ear normal.     Left Ear: External ear normal.     Mouth/Throat:     Mouth: Oropharynx is clear and moist.  Eyes:     General: No scleral icterus.       Right eye: No discharge.        Left eye: No discharge.  Neck:     Thyroid: No thyromegaly.  Cardiovascular:     Rate and Rhythm: Normal rate and regular rhythm.  Pulmonary:     Effort: No respiratory distress.     Breath sounds: Normal breath sounds. No wheezing.  Abdominal:     General: Bowel sounds are normal.     Palpations: Abdomen is soft.     Tenderness: There is no abdominal tenderness.  Musculoskeletal:        General: No swelling, tenderness or edema.     Cervical back: Neck supple. No tenderness.  Lymphadenopathy:     Cervical: No cervical adenopathy.  Skin:    Findings: No  erythema or rash.  Neurological:     Mental Status: She is alert.  Psychiatric:        Mood and Affect: Mood normal.        Behavior: Behavior normal.     BP 122/78   Pulse 77   Temp 98 F (36.7 C) (Oral)   Resp 16   Ht 5' 1" (1.549 m)   Wt 139 lb 9.6 oz (63.3 kg)   SpO2  98%   BMI 26.38 kg/m  Wt Readings from Last 3 Encounters:  01/23/21 139 lb 9.6 oz (63.3 kg)  12/30/20 138 lb (62.6 kg)  09/20/20 137 lb 6.4 oz (62.3 kg)     Lab Results  Component Value Date   WBC 6.4 09/15/2020   HGB 14.1 09/15/2020   HCT 42.1 09/15/2020   PLT 219.0 09/15/2020   GLUCOSE 95 11/09/2020   CHOL 149 09/15/2020   TRIG 96.0 09/15/2020   HDL 48.30 09/15/2020   LDLDIRECT 178.4 11/04/2013   LDLCALC 81 09/15/2020   ALT 16 09/15/2020   AST 18 09/15/2020   NA 135 11/09/2020   K 4.6 11/09/2020   CL 104 11/09/2020   CREATININE 1.02 11/09/2020   BUN 18 11/09/2020   CO2 23 11/09/2020   TSH 3.34 11/01/2020   HGBA1C 6.7 (H) 09/15/2020    DG Chest 2 View  Result Date: 07/31/2019 CLINICAL DATA:  87 year old female with history of pain under the left shoulder blade radiating into the left arm for the past 2 days. EXAM: CHEST - 2 VIEW COMPARISON:  Chest x-ray 11/04/2018. FINDINGS: Lung volumes are normal. No consolidative airspace disease. No pleural effusions. No pneumothorax. No pulmonary nodule or mass noted. Pulmonary vasculature and the cardiomediastinal silhouette are within normal limits. Atherosclerosis in the thoracic aorta. IMPRESSION: 1.  No radiographic evidence of acute cardiopulmonary disease. 2. Aortic atherosclerosis. Electronically Signed   By: Vinnie Langton M.D.   On: 07/31/2019 12:13       Assessment & Plan:   Problem List Items Addressed This Visit    Hypercholesterolemia    On lipitor.  Low cholesterol diet and exercise.  Follow lipid panel and liver function tests.       Relevant Orders   Hepatic function panel   Lipid panel   Hypertension    Blood pressure has  been doing well on quinapril and metoprolol.  Follow pressures.  Follow metabolic panel.       Relevant Orders   TSH   SOB (shortness of breath)    Breathing stable.       Stress    Increased stress.  Overall handling things well.  Follow.        SVT (supraventricular tachycardia) ([place])    Seeing cardiology.  On lopressor.  Just had echo.  Waiting for results.  Overall breathing stable.  No evidence of volume overload.  Currently stable.       Type 2 diabetes mellitus with hyperglycemia (HCC)    Low carb diet and exercise.  Follow met b and a1c.       Relevant Orders   Hemoglobin D2K   Basic metabolic panel   Microalbumin / creatinine urine ratio   Weakness    Not as active.  Weakness. Discussed physical therapy - to evaluate and treat - strengthening, etc.       Relevant Orders   Ambulatory referral to Physical Therapy       Einar Pheasant, MD

## 2021-01-25 ENCOUNTER — Telehealth: Payer: Self-pay | Admitting: *Deleted

## 2021-01-25 NOTE — Telephone Encounter (Signed)
Agree with recommendations to hold off on medication change to Quinapril.  --Continue current dose of Quinapril 40mg  daily.  -->In this setting, no need for BMET(s).  --Continue to monitor BP at home as she has been doing. --> Call office if BP consistently over 130/80.  It sounds like her BP has been much improved since her clinic visit, which is good news! I am so glad to hear it has been well controlled, and that she has been keeping an eye on it at home.

## 2021-01-25 NOTE — Telephone Encounter (Signed)
-----   Message from Lennon Alstrom, PA-C sent at 01/24/2021  9:50 PM EST ----- Echo showed --Normal pump function with EF 55-60%. --Walls of the heart are moving appropriately but mildly slow to relax, which happens with elevated BP over time.  --Normal right heart function.  --Mildly leaky mitral valve.  --Mildly leaky and slightly thickened aortic valve.  Recommendations:  --Strict BP control. --Increase to quinapril 80mg  daily.  --BMET needed now/baseline and again at 1-2 weeks after increased quinapril.  --We will monitor her valves with periodic echo.

## 2021-01-25 NOTE — Telephone Encounter (Signed)
Spoke to pt, notified of echo results and provider's recc.  Pt states that she checks her BP daily at home and states that her BP has been running 120s/70s and states that she is hesitant to increase her Quinapril. She asks if possible to continue current dose. Told pt that I would relay this information of her daily BP checks and call her with provider's advise.

## 2021-01-26 NOTE — Telephone Encounter (Signed)
Spoke to pt, notified of provider's recc. Pt verbalized understanding.  She will continue current dose of Quinapril 40mg  daily. Monitor BP daily and will call our office if consistently over 130/80. Pt understands no need for lab work at this time. Pt has no further questions at this time.

## 2021-01-29 ENCOUNTER — Encounter: Payer: Self-pay | Admitting: Internal Medicine

## 2021-01-29 NOTE — Assessment & Plan Note (Signed)
Blood pressure has been doing well on quinapril and metoprolol.  Follow pressures.  Follow metabolic panel.

## 2021-01-29 NOTE — Assessment & Plan Note (Signed)
Low carb diet and exercise.  Follow met b and a1c.  

## 2021-01-29 NOTE — Assessment & Plan Note (Signed)
Increased stress.  Overall handling things well.  Follow.   

## 2021-01-29 NOTE — Assessment & Plan Note (Signed)
On lipitor.  Low cholesterol diet and exercise.  Follow lipid panel and liver function tests.   

## 2021-01-29 NOTE — Assessment & Plan Note (Signed)
Breathing stable.

## 2021-01-29 NOTE — Assessment & Plan Note (Signed)
Not as active.  Weakness. Discussed physical therapy - to evaluate and treat - strengthening, etc.

## 2021-01-29 NOTE — Assessment & Plan Note (Signed)
Seeing cardiology.  On lopressor.  Just had echo.  Waiting for results.  Overall breathing stable.  No evidence of volume overload.  Currently stable.

## 2021-02-01 ENCOUNTER — Encounter: Payer: Self-pay | Admitting: Physical Therapy

## 2021-02-01 ENCOUNTER — Ambulatory Visit: Payer: Medicare Other | Attending: Internal Medicine | Admitting: Physical Therapy

## 2021-02-01 ENCOUNTER — Other Ambulatory Visit: Payer: Self-pay

## 2021-02-01 DIAGNOSIS — G8929 Other chronic pain: Secondary | ICD-10-CM | POA: Diagnosis present

## 2021-02-01 DIAGNOSIS — M6281 Muscle weakness (generalized): Secondary | ICD-10-CM | POA: Insufficient documentation

## 2021-02-01 DIAGNOSIS — M25511 Pain in right shoulder: Secondary | ICD-10-CM | POA: Insufficient documentation

## 2021-02-01 DIAGNOSIS — R2681 Unsteadiness on feet: Secondary | ICD-10-CM | POA: Insufficient documentation
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
## 2021-02-06 ENCOUNTER — Ambulatory Visit: Payer: Medicare Other | Admitting: Physical Therapy

## 2021-02-06 ENCOUNTER — Other Ambulatory Visit: Payer: Self-pay

## 2021-02-06 ENCOUNTER — Encounter: Payer: Self-pay | Admitting: Physical Therapy

## 2021-02-06 DIAGNOSIS — G8929 Other chronic pain: Secondary | ICD-10-CM

## 2021-02-06 DIAGNOSIS — M6281 Muscle weakness (generalized): Secondary | ICD-10-CM

## 2021-02-06 DIAGNOSIS — R2681 Unsteadiness on feet: Secondary | ICD-10-CM | POA: Diagnosis not present

## 2021-02-06 NOTE — Therapy (Signed)
[facility] PHYSICAL AND SPORTS MEDICINE [address] [place], [place], [address] Phone: [phone]   Fax:  [phone]  Physical Therapy Treatment  Patient Details  Name: Elizabeth HaymakerColleen C Livecchi MRN: [id number] Date of Birth: 03/04/1927 Referring Provider (PT): Dale DurhamScott, Charlene, MD   Encounter Date: 02/06/2021   PT End of Session - 02/06/21 1306    Visit Number 2    Number of Visits 16    Date for PT Re-Evaluation 03/29/21    Authorization Type [employer] Medicare reporting period from 02/01/2021    Progress Note Due on Visit 10    PT Start Time 1300    PT Stop Time 1340    PT Time Calculation (min) 40 min    Equipment Utilized During Treatment Gait belt    Activity Tolerance Patient tolerated treatment well    Behavior During Therapy [facility] for tasks assessed/performed           Past Medical History:  Diagnosis Date  . Cystocele or rectocele with incomplete uterine prolapse   . Hyperlipidemia   . Hypertension   . Irritable bowel syndrome   . Palpitations   . Vaginal atrophy     Past Surgical History:  Procedure Laterality Date  . CATARACT EXTRACTION  2006    There were no vitals filed for this visit.   Subjective Assessment - 02/06/21 1305    Subjective Patient reports she feels well today. Has had no falls or soreness since last treatment session. She has no pain upon arrival.    Pertinent History Patient is a 87 y.o. female who presents to outpatient physical therapy with a referral for medical diagnosis weakness. This patient's chief complaints consist of feeling like she is losing strength and loses her balance sometimes leading to the following functional deficits: difficulty maintaining her previous activity level and concern about being imbalanced, heavy household chores, etc.   Relevant past medical history and comorbidities include hypertension, rhythm irregularity, heart valve problem (mointors by her doctor), R shoulder pain, allergic to  house dust, type 2 diabetes, CKD, decreased hearing, R shoulder pain, dizziness, cystocele and uterine prolapse, cataract surgery, heartburn. Patient denies hx of cancer, stroke, seizures, lung problem, major cardiac events, diabetes, unexplained weight loss, changes in bowel or bladder problems, new onset stumbling or dropping things apart from described below.    Limitations Lifting;Standing;Walking;House hold activities   difficulty maintaining her previous activity level and concern about being imbalanced, heavy household chores, etc.   Patient Stated Goals to get stronger and have better balance    Currently in Pain? No/denies          OBJECTIVE FOTO = 85 Abbreviated ABC = 25% Floor to waist lift: 1x20#KB   TREATMENT:   Therapeutic exercise: to centralize symptoms and improve ROM, strength, muscular endurance, and activity tolerance required for successful completion of functional activities.  - NuStep level 2 using bilateral upper and lower extremities. Seat/handle setting 5/8. For improved extremity mobility, muscular endurance, and activity tolerance; and to induce the analgesic effect of aerobic exercise, stimulate improved joint nutrition, and prepare body structures and systems for following interventions. x 6  Minutes. Average SPM = 63.  -  Floor to waist lift 1x10#, 1x15#, 1x20#KB (feels pretty heavy). - air squats without and with 3#DB bicep curl (both arms) as she used to do at home. Asked her to start incorporating 3#DBs into her usual 2x15 squats a day. Add DB for 15 each day and if that feels fine  use DBs for all squats.   - Education on HEP including handout   Neuromuscular Re-education: to improve, balance, postural strength, muscle activation patterns, and stabilization strength required for functional activities: - tandem stance with touchdown UE support x 60 seconds each side - tandem stance with horizontal cervical spine rotation 1x20 with each foot forward.   Pt  required multimodal cuing for proper technique and to facilitate improved neuromuscular control, strength, range of motion, and functional ability resulting in improved performance and form.  Required 13 min to complete FOTO with intermittant assistance from PT to navigate tablet and select comorbid conditions. (unbilled time).   HOME EXERCISE PROGRAM Access Code: [email or web address] URL: [email or web address] Date: 02/06/2021 Prepared by: Norton Blizzard  Exercises Tandem Stance with Head Rotation - 1 x daily - 2 sets - 20 reps     PT Education - 02/06/21 1312    Education Details Exercise purpose/form. Self management techniques.    Person(s) Educated Patient    Methods Explanation;Demonstration;Tactile cues;Verbal cues;Handout    Comprehension Returned demonstration;Verbalized understanding;Verbal cues required;Tactile cues required;Need further instruction            PT Short Term Goals - 02/01/21 1946      PT SHORT TERM GOAL #1   Title Be independent with initial home exercise program for self-management of symptoms.    Baseline Initial HEP to be provided visit 2 as appropriate (02/01/2021);    Time 3    Period Weeks    Status New    Target Date 02/22/21             PT Long Term Goals - 02/01/21 1946      PT LONG TERM GOAL #1   Title Be independent with a long-term home exercise program for self-management of symptoms.    Baseline Initial HEP to be provided visit 2 as appropriate (02/01/2021);    Time 12    Period Weeks    Status New   TARGET DATE FOR ALL LONG TERM GOALS: 04/26/2021     PT LONG TERM GOAL #2   Title Demonstrate improved FOTO score by 10 units to demonstrate improvement in overall condition and self-reported functional ability.    Baseline to be tested vist 2 as appropriate (02/01/2021);    Time 12    Period Weeks    Status New      PT LONG TERM GOAL #3   Title Patient will demonstrate improved FGA to equal or more than 25/30 to demonstrate improved  fall risk to low to improve patient's ability to complete housework and mobility safetly.    Baseline 22/30 (02/01/2021);    Time 12    Period Weeks    Status New      PT LONG TERM GOAL #4   Title Patient will demonstrate floor to waist lift of equal or greater than 20# x 10 reps with good form to improve her ability to complete heavy housework and lift groceries at home.    Baseline to be tested at visit 2 as apporpriate (02/01/2021):    Time 12    Period Weeks    Status New      PT LONG TERM GOAL #5   Title Complete community, work and/or recreational activities without limitation due to current condition.    Baseline limiting ability to complete her usual level of activity, ambulate safely, manage her home alone, and complete heavy household chores without difficulty (02/01/2021);    Time 12    Period Weeks  Status New                 Plan - 02/06/21 1406    Clinical Impression Statement Patient tolerated treatment well overall and initial HEP was established as well as baseline lifting, FOTO, and Abbreviated ABC scale score. Patient took extra time to explain her history of vertigo including that it had been determined to be visual and related to fluid balance and that she specifically does not want to do any positional testing, that she was determined not to have positional vertigo in the past and then was recommended to try testing for it again but does not want to currently. She has dealt with vertigo for 65 years or so. Patient was able to complete exercise included in HEP with enough skill to perform safely at home. Would benefit from continued strengthening and balance exercises to incorporate into current exercise routine at home. Advised to start adding back her 3# weights to her squats this week. Patient may benefit from cuing for deeper squat and more refined form with weights in the future. Patient would benefit from continued management of limiting condition by skilled  physical therapist to address remaining impairments and functional limitations to work towards stated goals and return to PLOF or maximal functional independence.    Personal Factors and Comorbidities Age;Comorbidity 3+    Comorbidities hypertension, rhythm irregularity, heart valve problem (mointors by her doctor), R shoulder pain, allergic to house dust, type 2 diabetes, CKD, decreased hearing, R shoulder pain, dizziness, cystocele and uterine prolapse, cataract surgery, heartburn.    Examination-Activity Limitations Locomotion Level;Lift;Reach Overhead;Carry;Stairs;Other   complete her usual level of activity, ambulate safely, manage her home alone, and complete heavy household chores   Examination-Participation Restrictions Cleaning;Yard Work;Community Activity    Stability/Clinical Decision Making Stable/Uncomplicated    Rehab Potential Good    PT Frequency 2x / week    PT Duration 12 weeks    PT Treatment/Interventions ADLs/Self Care Home Management;Cryotherapy;Moist Heat;Electrical Stimulation;DME Instruction;Functional mobility training;Therapeutic activities;Therapeutic exercise;Balance training;Neuromuscular re-education;Patient/family education;Gait training;Manual techniques;Dry needling;Passive range of motion;Spinal Manipulations;Visual/perceptual remediation/compensation;Joint Manipulations    PT Next Visit Plan update HEP, balance, strength training    PT Home Exercise Plan Medbridge Access Code: [email or web address]    Consulted and Agree with Plan of Care Patient           Patient will benefit from skilled therapeutic intervention in order to improve the following deficits and impairments:  Dizziness,Pain,Decreased coordination,Decreased mobility,Impaired UE functional use,Impaired perceived functional ability,Decreased strength,Decreased endurance,Decreased activity tolerance,Decreased balance  Visit Diagnosis: Unsteadiness on feet  Muscle weakness (generalized)  Chronic right  shoulder pain     Problem List Patient Active Problem List   Diagnosis Date Noted  . Right shoulder pain 10/02/2020  . CKD (chronic kidney disease) stage 3, GFR 30-59 ml/min (HCC) 10/02/2020  . Elevated TSH 10/02/2020  . Weakness 05/29/2020  . Type 2 diabetes mellitus with hyperglycemia (HCC) 01/07/2020  . GERD (gastroesophageal reflux disease) 11/09/2018  . Dizziness 06/02/2017  . Vaginal atrophy 09/27/2016  . SVT (supraventricular tachycardia) (HCC) 01/27/2016  . Headache 01/26/2016  . Left arm numbness 01/26/2016  . Urethral caruncle 12/23/2015  . Vaginal pessary in situ 09/22/2015  . Health care maintenance 07/22/2015  . Baden-Walker grade 4 cystocele 06/26/2015  . First degree uterine prolapse 06/26/2015  . Hyperkalemia 04/27/2015  . Arrhythmia 04/27/2015  . SOB (shortness of breath) 04/27/2015  . Double vision 07/18/2014  . Decreased hearing 07/18/2014  . Stress 07/18/2014  . Fatigue 07/18/2014  .  Environmental allergies 07/09/2013  . Hypertension 12/09/2012  . Hypercholesterolemia 12/09/2012    Luretha Murphy. Ilsa Iha, PT, DPT 02/06/21, 2:10 PM  [facility] PHYSICAL AND SPORTS MEDICINE [address], [place], [address] Phone: [phone]   Fax:  [phone]  Name: LIDDY DEAM MRN: [id number] Date of Birth: [birth year]

## 2021-02-08 ENCOUNTER — Other Ambulatory Visit: Payer: Self-pay

## 2021-02-08 ENCOUNTER — Telehealth: Payer: Self-pay | Admitting: Cardiovascular Disease

## 2021-02-08 ENCOUNTER — Telehealth: Payer: Self-pay | Admitting: Internal Medicine

## 2021-02-08 ENCOUNTER — Ambulatory Visit: Payer: Medicare Other

## 2021-02-08 DIAGNOSIS — R2681 Unsteadiness on feet: Secondary | ICD-10-CM

## 2021-02-08 DIAGNOSIS — M6281 Muscle weakness (generalized): Secondary | ICD-10-CM

## 2021-02-08 DIAGNOSIS — G8929 Other chronic pain: Secondary | ICD-10-CM

## 2021-02-08 NOTE — Telephone Encounter (Signed)
Was able to return pt's call, Mrs. Rotunno reports went to PT this afternoon, noticed she was getting shob while doing "weights" during therapy, they checked her BP and it read 178/90. PT was d/c at that time and pt went home. Later pt reports BP 177/99. Did take her morning lopressor and Quinapril. Before call back pt reports BP 158/85. Pt reports has felt fine other then feeling "head is full, like if I bend over, I could keep going to the ground". Pt has sinus issues and has not taken her Zyrtec as well in the past 3 days, st it makes her drowsy and its has not been helping lately. Advised pt to try OTC non-drowsy Allegra. Pt verbalized she will try that instead of her Zyrtec, reports "I know it's my sinus issue causing the head fullness". Mrs. Nicolaisen also reported it was unlike her to get shob during PT and her BP to be that elevated. She also noticed some swelling to her lower legs and face. She thinks she has fluid build up. Pt has seen PCP and has they discuss reduce NA+ intake and the DASH diet, although pt does not have any fluid pills. Advised pt to rest for 30 mins with her feet elevated, then retake her BP, if still elevated then take her afternoon dose of Lopressor, if not then take her afternoon dose at there regular time. Advised to take her morning BP meds as usual tomorrow morning. Pt verbalized understanding. Pt also questioning the generic brand of Quinapril that she recently was put on d/t insurance and feels that generic is not as effected as the name brand Accupril. Suggested an appt to discus her HTN, BP meds and new onset of "fluid retention", pt agrees. Appt made for 2/17 at 3pm with Gillian Shields, NP. Pt is very delighted with call back and getting her an appt. At this time, all questions or concerns were address and no additional concerns at this time. Agreeable to plan, will call back for anything further.

## 2021-02-08 NOTE — Telephone Encounter (Signed)
Elizabeth [name] had PT today.  They messaged stating she had sob and elevated blood pressure with her activity.  They ended session early and instructed her to call her cardiologist and/or PCP.  Please call and and see how she is doing.  If acute symptoms, persistent sob, chest pain, etc - needs to be seen.

## 2021-02-08 NOTE — Therapy (Signed)
[facility] PHYSICAL AND SPORTS MEDICINE [address] [place], [address] Phone: [phone]   Fax:  [phone]  Physical Therapy Treatment  Patient Details  Name: Elizabeth [name] MRN: [id number] Date of Birth: 12-17-1927 Referring Provider (PT): Dale Glendora, MD   Encounter Date: 02/08/2021   PT End of Session - 02/08/21 1311    Visit Number 3    Number of Visits 16    Date for PT Re-Evaluation 03/29/21    Authorization Type [employer] Medicare reporting period from 02/01/2021    PT Start Time 1305    PT Stop Time 1343    PT Time Calculation (min) 38 min    Equipment Utilized During Treatment Gait belt    Activity Tolerance Patient tolerated treatment well;No increased pain    Behavior During Therapy WFL for tasks assessed/performed           Past Medical History:  Diagnosis Date  . Cystocele or rectocele with incomplete uterine prolapse   . Hyperlipidemia   . Hypertension   . Irritable bowel syndrome   . Palpitations   . Vaginal atrophy     Past Surgical History:  Procedure Laterality Date  . CATARACT EXTRACTION  2006    There were no vitals filed for this visit.   Subjective Assessment - 02/08/21 1309    Subjective Pt doing well today. Pt denies any updates since prior session.    Pertinent History Patient is a 87 y.o. female who presents to outpatient physical therapy with a referral for medical diagnosis weakness. This patient's chief complaints consist of feeling like she is losing strength and loses her balance sometimes leading to the following functional deficits: difficulty maintaining her previous activity level and concern about being imbalanced, heavy household chores, etc.   Relevant past medical history and comorbidities include hypertension, rhythm irregularity, heart valve problem (mointors by her doctor), R shoulder pain, allergic to house dust, type 2 diabetes, CKD, decreased hearing, R shoulder pain, dizziness,  cystocele and uterine prolapse, cataract surgery, heartburn. Patient denies hx of cancer, stroke, seizures, lung problem, major cardiac events, diabetes, unexplained weight loss, changes in bowel or bladder problems, new onset stumbling or dropping things apart from described below.    Currently in Pain? No/denies          INTERVENTION THIS DATE: -NuStep level 2 using bilateral upper and lower extremities. Seat/handle setting 5/8. For improved extremity mobility, muscular endurance, and activity tolerance; and to induce the analgesic effect of aerobic exercise, stimulate improved joint nutrition, and prepare body structures and systems for following interventions. x 6  Minutes. Average SPM = 63 -RDL  c freeweight 2x10 c 15lb free weight  -STS from chair hands free 1x10   -pt asks to sit and recover SOB for next 20 minutes; never fully recovered. Pt appears mildly anxious. VSS, SpO2 96%, HR 76bpm, regular rhythm, no palpable arrtythmia.  RUE BP x2 with pressure sin 170s/80s.  Session ended, pt asked to call PCP/cardiology regarding pressures.       PT Short Term Goals - 02/01/21 1946      PT SHORT TERM GOAL #1   Title Be independent with initial home exercise program for self-management of symptoms.    Baseline Initial HEP to be provided visit 2 as appropriate (02/01/2021);    Time 3    Period Weeks    Status New    Target Date 02/22/21  PT Long Term Goals - 02/01/21 1946      PT LONG TERM GOAL #1   Title Be independent with a long-term home exercise program for self-management of symptoms.    Baseline Initial HEP to be provided visit 2 as appropriate (02/01/2021);    Time 12    Period Weeks    Status New   TARGET DATE FOR ALL LONG TERM GOALS: 04/26/2021     PT LONG TERM GOAL #2   Title Demonstrate improved FOTO score by 10 units to demonstrate improvement in overall condition and self-reported functional ability.    Baseline to be tested vist 2 as appropriate  (02/01/2021);    Time 12    Period Weeks    Status New      PT LONG TERM GOAL #3   Title Patient will demonstrate improved FGA to equal or more than 25/30 to demonstrate improved fall risk to low to improve patient's ability to complete housework and mobility safetly.    Baseline 22/30 (02/01/2021);    Time 12    Period Weeks    Status New      PT LONG TERM GOAL #4   Title Patient will demonstrate floor to waist lift of equal or greater than 20# x 10 reps with good form to improve her ability to complete heavy housework and lift groceries at home.    Baseline to be tested at visit 2 as apporpriate (02/01/2021):    Time 12    Period Weeks    Status New      PT LONG TERM GOAL #5   Title Complete community, work and/or recreational activities without limitation due to current condition.    Baseline limiting ability to complete her usual level of activity, ambulate safely, manage her home alone, and complete heavy household chores without difficulty (02/01/2021);    Time 12    Period Weeks    Status New             Plan - 02/08/21 1312    Clinical Impression Statement Continued with current plan of care as laid out in evaluation and recent prior sessions. Pt more SOB today with interventions, pt concerned about more fluid retention. Pt has increased dyspnea after exercise which does not improve, pt noted to have elevate dBP, HR, SpO2 WNL. Session ended early as pt is anxious and SOB.    Personal Factors and Comorbidities Age;Comorbidity 3+    Comorbidities hypertension, rhythm irregularity, heart valve problem (mointors by her doctor), R shoulder pain, allergic to house dust, type 2 diabetes, CKD, decreased hearing, R shoulder pain, dizziness, cystocele and uterine prolapse, cataract surgery, heartburn.    Examination-Activity Limitations Locomotion Level;Lift;Reach Overhead;Carry;Stairs;Other    Examination-Participation Restrictions Cleaning;Yard Work;Community Activity     Stability/Clinical Decision Making Stable/Uncomplicated    Rehab Potential Good    PT Frequency 2x / week    PT Duration 12 weeks    PT Treatment/Interventions ADLs/Self Care Home Management;Cryotherapy;Moist Heat;Electrical Stimulation;DME Instruction;Functional mobility training;Therapeutic activities;Therapeutic exercise;Balance training;Neuromuscular re-education;Patient/family education;Gait training;Manual techniques;Dry needling;Passive range of motion;Spinal Manipulations;Visual/perceptual remediation/compensation;Joint Manipulations    PT Next Visit Plan update HEP, balance, strength training    PT Home Exercise Plan Medbridge Access Code: [email or web address]    Consulted and Agree with Plan of Care Patient           Patient will benefit from skilled therapeutic intervention in order to improve the following deficits and impairments:  Dizziness,Pain,Decreased coordination,Decreased mobility,Impaired UE functional use,Impaired perceived functional ability,Decreased strength,Decreased endurance,Decreased activity  tolerance,Decreased balance  Visit Diagnosis: Unsteadiness on feet  Muscle weakness (generalized)  Chronic right shoulder pain     Problem List Patient Active Problem List   Diagnosis Date Noted  . Right shoulder pain 10/02/2020  . CKD (chronic kidney disease) stage 3, GFR 30-59 ml/min (HCC) 10/02/2020  . Elevated TSH 10/02/2020  . Weakness 05/29/2020  . Type 2 diabetes mellitus with hyperglycemia (HCC) 01/07/2020  . GERD (gastroesophageal reflux disease) 11/09/2018  . Dizziness 06/02/2017  . Vaginal atrophy 09/27/2016  . SVT (supraventricular tachycardia) (HCC) 01/27/2016  . Headache 01/26/2016  . Left arm numbness 01/26/2016  . Urethral caruncle 12/23/2015  . Vaginal pessary in situ 09/22/2015  . Health care maintenance 07/22/2015  . Baden-Walker grade 4 cystocele 06/26/2015  . First degree uterine prolapse 06/26/2015  . Hyperkalemia 04/27/2015  . Arrhythmia  04/27/2015  . SOB (shortness of breath) 04/27/2015  . Double vision 07/18/2014  . Decreased hearing 07/18/2014  . Stress 07/18/2014  . Fatigue 07/18/2014  . Environmental allergies 07/09/2013  . Hypertension 12/09/2012  . Hypercholesterolemia 12/09/2012   1:42 PM, 02/08/21 Rosamaria Lints, PT, DPT Physical Therapist - [place] [phone] (Office)   Buccola,Allan C 02/08/2021, 1:14 PM  [place] PHYSICAL AND SPORTS MEDICINE [address], [place], [address] Phone: [phone]   Fax:  [phone]  Name: SHALYNN JORSTAD MRN: [id number] Date of Birth: [birth year]

## 2021-02-08 NOTE — Telephone Encounter (Signed)
LMTCB

## 2021-02-08 NOTE — Telephone Encounter (Signed)
Pt c/o BP issue: STAT if pt c/o blurred vision, one-sided weakness or slurred speech  1. What are your last 5 BP readings?  178/90 177/99  2. Are you having any other symptoms (ex. Dizziness, headache, blurred vision, passed out)? Balance problems, SOB  3. What is your BP issue? BP is too high - was at physical therapy and he told her to contact cardiologist.

## 2021-02-09 ENCOUNTER — Other Ambulatory Visit: Payer: Self-pay

## 2021-02-09 ENCOUNTER — Ambulatory Visit (INDEPENDENT_AMBULATORY_CARE_PROVIDER_SITE_OTHER): Payer: Medicare Other | Admitting: Family

## 2021-02-09 ENCOUNTER — Other Ambulatory Visit
Admission: RE | Admit: 2021-02-09 | Discharge: 2021-02-09 | Disposition: A | Discharge status: Home / Self Care | Payer: Medicare Other | Source: Ambulatory Visit | Attending: Family | Admitting: Family

## 2021-02-09 ENCOUNTER — Encounter: Payer: Self-pay | Admitting: Family

## 2021-02-09 VITALS — BP 144/78 | HR 68 | Ht 61.0 in | Wt 140.0 lb

## 2021-02-09 DIAGNOSIS — I471 Supraventricular tachycardia: Secondary | ICD-10-CM

## 2021-02-09 DIAGNOSIS — R600 Localized edema: Secondary | ICD-10-CM | POA: Diagnosis not present

## 2021-02-09 DIAGNOSIS — I10 Essential (primary) hypertension: Secondary | ICD-10-CM

## 2021-02-09 DIAGNOSIS — I872 Venous insufficiency (chronic) (peripheral): Secondary | ICD-10-CM | POA: Insufficient documentation

## 2021-02-09 DIAGNOSIS — R0602 Shortness of breath: Secondary | ICD-10-CM

## 2021-02-09 LAB — BASIC METABOLIC PANEL
Anion gap: 8 (ref 5–15)
BUN: 32 mg/dL — ABNORMAL HIGH (ref 8–23)
CO2: 25 mmol/L (ref 22–32)
Calcium: 9.2 mg/dL (ref 8.9–10.3)
Chloride: 102 mmol/L (ref 98–111)
Creatinine, Ser: 1.48 mg/dL — ABNORMAL HIGH (ref 0.44–1.00)
GFR, Estimated: 33 mL/min — ABNORMAL LOW (ref >60)
Glucose, Bld: 104 mg/dL — ABNORMAL HIGH (ref 70–99)
Potassium: 5.4 mmol/L — ABNORMAL HIGH (ref 3.5–5.1)
Sodium: 135 mmol/L (ref 135–145)

## 2021-02-09 MED ORDER — HYDROCHLOROTHIAZIDE 25 MG PO TABS: 12.5000 mg | ORAL_TABLET | Freq: Every day | ORAL | 2 refills | Status: DC

## 2021-02-09 NOTE — Telephone Encounter (Signed)
LMTCB

## 2021-02-09 NOTE — Progress Notes (Unsigned)
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 Cystocele or rectocele with incomplete uterine prolapse   . Hyperlipidemia   . Hypertension   . Irritable bowel syndrome   . Palpitations   . Vaginal atrophy    Past Surgical History:  Procedure Laterality Date  . CATARACT EXTRACTION  2006   Allergies No Known Allergies  History of Present Illness    Elizabeth [name] is a 87 y.o. female with a hx of SVT, hypertension, hyperlipidemia, hyponatremia, vertigo, IBS  last seen 12/30/2020 by Leafy Kindle, PA.  Presents today for follow up at recommendation of her PT. She started PT after noticing that when her husband passed and through the pandemic she was not as active.  Tells me she has been noticing some swelling in her ankles for at least 4-5 months. Previous Echocardiogram 01/19/21 with LVEF 55-60%, grade 1 diastolic dysfunction, no RWMA, normal PASP, mild MR, mild AI which we reviewed in clinic.   She started PT a few weeks ago and did fine during initial session. Tells me yesterday she got on recumbent bike for 6 minutes and when she got done she felt more short of breath than usual. She then did a 15 pound weight for about 10 repititions but got more short of breath. Her BP was 170s. Tells me prior her home BP routinely 120s/70s. Tells me since then she has been checking at home with BP at home 170s in the mornings then improve to 140s. Reports feeling "heavy" in her head. She tells me she will get occasional shortness of breath when she lays  on her left side which resolves on its own. No orthopnea, no PND. Tells me her face was puffy yesterday.   Takes her Metoprolol Tartrate 25mg  twice daily approximately around 9am and 6pm.  apart.  She takes Quinapril 40mg  in the morning around breakfast time.   EKGs/Labs/Other Studies Reviewed:   The following studies were reviewed today: Echo 01/19/21  1. Left ventricular ejection fraction, by estimation, is 55 to 60%. The  left ventricle has normal function. The left ventricle has no regional  wall motion abnormalities. Left ventricular diastolic parameters are  consistent with Grade I diastolic  dysfunction (impaired relaxation).   2. Right ventricular systolic function is normal. The right ventricular  size is normal. There is normal pulmonary artery systolic pressure.   3. The mitral valve is normal in structure. Mild mitral valve  regurgitation.   4. The aortic valve is tricuspid. Aortic valve regurgitation is mild.  Mild aortic valve sclerosis is present, with no evidence of aortic valve  stenosis.   EKG:  No EKG today.  Recent Labs: 09/15/2020: ALT 16; Hemoglobin 14.1; Platelets 219.0 11/01/2020: TSH 3.34 02/09/2021: BUN 32; Creatinine, Ser 1.48; Potassium 5.4; Sodium 135  Recent Lipid Panel    Component Value Date/Time   CHOL 149 09/15/2020 0858   TRIG 96.0 09/15/2020 0858   HDL 48.30 09/15/2020 0858   CHOLHDL 3 09/15/2020 0858   VLDL 19.2  09/15/2020 0858   LDLCALC 81 09/15/2020 0858   LDLDIRECT 178.4 11/04/2013 0821   Home Medications   Current Meds  Medication Sig  . aspirin 81 MG tablet Take 81 mg by mouth daily.  . cetirizine (ZYRTEC) 5 MG tablet Take 5 mg by mouth daily as needed.   Marland Kitchen estradiol (ESTRACE) 0.1 MG/GM vaginal cream PLACE 1 APPLICATORFUL VAGINALLY 2 TIMES A WEEK  . hydrochlorothiazide (HYDRODIURIL) 25 MG tablet Take 0.5 tablets (12.5 mg total) by mouth daily.  Marland Kitchen LIPITOR 40 MG tablet TAKE 1 TABLET BY MOUTH ONCE DAILY  . magnesium oxide (MAG-OX) 400  (241.3 Mg) MG tablet TAKE 1 TABLET BY MOUTH ONCE DAILY  . metoprolol tartrate (LOPRESSOR) 25 MG tablet Take 1 tablet (25 mg total) by mouth 2 (two) times daily.  . quinapril (ACCUPRIL) 40 MG tablet Take 1 tablet (40 mg total) by mouth daily.  Marland Kitchen triamcinolone (NASACORT) 55 MCG/ACT nasal inhaler Place 2 sprays into the nose daily as needed.      Review of Systems     All other systems reviewed and are otherwise negative except as noted above.  Physical Exam    VS:  BP (!) 144/78 (BP Location: Left Arm, Patient Position: Sitting, Cuff Size: Normal)   Pulse 68   Ht 5\' 1"  (1.549 m)   Wt 140 lb (63.5 kg)   SpO2 99%   BMI 26.45 kg/m  , BMI Body mass index is 26.45 kg/m.  Wt Readings from Last 3 Encounters:  02/09/21 140 lb (63.5 kg)  01/23/21 139 lb 9.6 oz (63.3 kg)  12/30/20 138 lb (62.6 kg)     GEN: Well nourished, well developed, in no acute distress. HEENT: normal. Neck: Supple, no JVD, carotid bruits, or masses. Cardiac: RRR, no murmurs, rubs, or gallops. No clubbing, cyanosis. Trace bilateral pedal edema..  Radials/DP/PT 2+ and equal bilaterally.  Respiratory:  Respirations regular and unlabored, clear to auscultation bilaterally. GI: Soft, nontender, nondistended. MS: No deformity or atrophy. Skin: Warm and dry, no rash. Varicose veins noted bilateral lower extremity.  Neuro:  Strength and sensation are intact. Psych: Normal affect.  Assessment & Plan    1. Paroxysmal SVT / AT - No recurrence. Denies palpitations. Continue Metoprolol at present dose. Encouraged to take 12 hours apart.   2. Dyspnea / LE edema / Venous insufficiency - Echo 12/2020 with normal LVEF and gr1DD with no significant valvular abnormalities. No evidence of heart failure as contributory to 4-5 month history of LE edema. Likely etiology venous insufficiency and dependent edema. Start HCTZ 12.5 mg QD due to trace pedal edema on exam and uncontrolled hypertension. Leg elevation, compression stockings, low  salt diet encouraged. Her dyspnea occurred once during PT session in setting of markedly elevated BP. She does not routinely have exertional dyspnea and as such, no indication for ischemic evaluation at this time though it could be considered if symptoms worsen.   3. HTN - BP elevated over last week. She is concerned her Quinapril is not as effective as the name brand. Discussed that this is not often an issue. She is hesitant regarding medication changes. Due to trace pedal edema, BP not at goal we will start HCTZ 12.60m QD with careful monitoring of renal function with BMP today and in one week. If BP not at goal and labs stable, plan to increase to 25mg  QD. Alternatives include increased dose of Quinapril. Hesitant to utilize Amlodipine given her trace pedal edema.  4. HLD, LDL goal <100 - Continue  Lipitor 40mg  daily.  Disposition: Follow up in 6 month(s) with Dr. or APP   Signed, Mariah Milling, NP 02/10/2021, 5:28 PM [facility]

## 2021-02-09 NOTE — Patient Instructions (Addendum)
Medication Instructions:  Your provider has recommended you make the following change in your medication:   START Hydrochlorothiazide 12.5mg  (half tablet) daily  *If you need a refill on your cardiac medications before your next appointment, please call your pharmacy*  Lab Work: Your provider recommends  lab work today: BMP -  Please go to the [facility]. You will check in at the front desk to the right as you walk into the atrium.  Your provider recommends that you return for lab work in 1-1.5 weeks for BMP at the [facility]. You do not need to be fasting for this lab work. You do not need an appointment. Simply stop by the [facility] on Monday through Friday anytime 8 am to 5pm.  -  Please go to the [facility]. You will check in at the front desk to the right as you walk into the atrium. Valet Parking is offered if needed. - No appointment needed. You may go any day between 7 am and 6 pm.   Testing/Procedures: None ordered today. Your recent echocardiogram showed normal heart pumping function and mild heart stiffness.   Follow-Up: At [facility], you and your health needs are our priority.  As part of our continuing mission to provide you with exceptional heart care, we have created designated Provider Care Teams.  These Care Teams include your primary Cardiologist (physician) and Advanced Practice Providers (APPs -  Physician Assistants and Nurse Practitioners) who all work together to provide you with the care you need, when you need it.  We recommend signing up for the patient portal called "MyChart".  Sign up information is provided on this After Visit Summary.  MyChart is used to connect with patients for Virtual Visits (Telemedicine).  Patients are able to view lab/test results, encounter notes, upcoming appointments, etc.  Non-urgent messages can be sent to your provider as well.   To learn more about what you can do with MyChart, go to [email or web address].     Your next appointment:   6 month(s)  The format for your next appointment:   In Person  Provider:   You may see Julien Nordmann, MD or one of the following Advanced Practice Providers on your designated Care Team:    Nicolasa Ducking, NP  Eula Listen, PA-C  Marisue Ivan, PA-C  Cadence Fransico Michael, [place]  Gillian Shields, NP  Other Instructions   Elevated your legs when sitting.   Recommend wearing compression stockings as needed for swelling.   Recommend low salt diet to help prevent your legs from swelling.   Tips to Measure your Blood Pressure Correctly Call our office if you notice your weight is consistently more than 130/80.  Here's what you can do to ensure a correct reading: . Don't drink a caffeinated beverage or smoke during the 30 minutes before the test. . Sit quietly for five minutes before the test begins. . During the measurement, sit in a chair with your feet on the floor and your arm supported so your elbow is at about heart level. . The inflatable part of the cuff should completely cover at least 80% of your upper arm, and the cuff should be placed on bare skin, not over a shirt. . Don't talk during the measurement. . Have your blood pressure measured twice, with a brief break in between. If the readings are different by 5 points or more, have it done a third time.  There are times to break these rules. If you sometimes  feel lightheaded when getting out of bed in the morning or when you stand after sitting, you should have your blood pressure checked while seated and then while standing to see if it falls from one position to the next.  In 2017, new guidelines from the American Heart Association, the [employer] of Cardiology, and nine other health organizations lowered the diagnosis of high blood pressure to 130/80 mm Hg or higher for all adults. The guidelines also redefined the various blood pressure categories to now include normal, elevated, Stage 1  hypertension, Stage 2 hypertension, and hypertensive crisis (see "Blood pressure categories").  Blood pressure categories  Blood pressure category SYSTOLIC (upper number)  DIASTOLIC (lower number)  Normal Less than 120 mm Hg and Less than 80 mm Hg  Elevated 120-129 mm Hg and Less than 80 mm Hg  High blood pressure: Stage 1 hypertension 130-139 mm Hg or 80-89 mm Hg  High blood pressure: Stage 2 hypertension 140 mm Hg or higher or 90 mm Hg or higher  Hypertensive crisis (consult your doctor immediately) Higher than 180 mm Hg and/or Higher than 120 mm Hg  Source: American Heart Association and American Stroke Association. For more on getting your blood pressure under control, buy Controlling Your Blood Pressure, a Special Health Report from [facility].   Blood Pressure Log   Date   Time  Blood Pressure  Position  Example: Nov 1 9 AM 124/78 sitting                                                    Chronic Venous Insufficiency Chronic venous insufficiency is a condition where the leg veins cannot effectively pump blood from the legs to the heart. This happens when the vein walls are either stretched, weakened, or damaged, or when the valves inside the vein are damaged. With the right treatment, you should be able to continue with an active life. This condition is also called venous stasis. What are the causes? Common causes of this condition include:  High blood pressure inside the veins (venous hypertension).  Sitting or standing too long, causing increased blood pressure in the leg veins.  A blood clot that blocks blood flow in a vein (deep vein thrombosis, DVT).  Inflammation of a vein (phlebitis) that causes a blood clot to form.  Tumors in the pelvis that cause blood to back up. What increases the risk? The following factors may make you more likely to develop this condition:  Having a family history of this  condition.  Obesity.  Pregnancy.  Living without enough regular physical activity or exercise (sedentary lifestyle).  Smoking.  Having a job that requires long periods of standing or sitting in one place.  Being a certain age. Women in their 24s and 41s and men in their 67s are more likely to develop this condition. What are the signs or symptoms? Symptoms of this condition include:  Veins that are enlarged, bulging, or twisted (varicose veins).  Skin breakdown or ulcers.  Reddened skin or dark discoloration of skin on the leg between the knee and ankle.  Brown, smooth, tight, and painful skin just above the ankle, usually on the inside of the leg (lipodermatosclerosis).  Swelling of the legs. How is this diagnosed? This condition may be diagnosed based on:  Your medical history.  A physical  exam. ?  Follow these instructions at home:  Wear compression stockings as told by your health care provider. These stockings help to prevent blood clots and reduce swelling in your legs.  Take over-the-counter and prescription medicines only as told by your health care provider.  Stay active by exercising, walking, or doing different activities. Ask your health care provider what activities are safe for you and how much exercise you need.  Drink enough fluid to keep your urine pale yellow.  Do not use any products that contain nicotine or tobacco, such as cigarettes, e-cigarettes, and chewing tobacco. If you need help quitting, ask your health care provider.  Keep all follow-up visits as told by your health care provider. This is important.      Contact a health care provider if you:  Have redness, swelling, or more pain in the affected area.  See a red streak or line that goes up or down from the affected area.  Have skin breakdown or skin loss in the affected area, even if the breakdown is small.  Get an injury in the affected area. Summary  Chronic venous  insufficiency is a condition where the leg veins cannot effectively pump blood from the legs to the heart.  Chronic venous insufficiency occurs when the vein walls become stretched, weakened, or damaged, or when valves within the vein are damaged.  Treatment depends on how severe your condition is. It often involves wearing compression stockings and may involve having a procedure.  Make sure you stay active by exercising, walking, or doing different activities. Ask your health care provider what activities are safe for you and how much exercise you need. This information is not intended to replace advice given to you by your health care provider. Make sure you discuss any questions you have with your health care provider. Document Revised: 09/02/2018 Document Reviewed: 09/02/2018 Elsevier Patient Education  2021 [employer].

## 2021-02-10 ENCOUNTER — Encounter: Payer: Self-pay | Admitting: Family

## 2021-02-10 NOTE — Telephone Encounter (Signed)
Pt saw cardiology yesterday.

## 2021-02-14 ENCOUNTER — Other Ambulatory Visit
Admission: RE | Admit: 2021-02-14 | Discharge: 2021-02-14 | Disposition: A | Discharge status: Home / Self Care | Payer: Medicare Other | Source: Ambulatory Visit | Attending: Family | Admitting: Family

## 2021-02-14 ENCOUNTER — Telehealth: Payer: Self-pay | Admitting: *Deleted

## 2021-02-14 ENCOUNTER — Other Ambulatory Visit: Payer: Self-pay

## 2021-02-14 DIAGNOSIS — I471 Supraventricular tachycardia: Secondary | ICD-10-CM | POA: Insufficient documentation

## 2021-02-14 DIAGNOSIS — I10 Essential (primary) hypertension: Secondary | ICD-10-CM | POA: Insufficient documentation

## 2021-02-14 DIAGNOSIS — I872 Venous insufficiency (chronic) (peripheral): Secondary | ICD-10-CM | POA: Diagnosis present

## 2021-02-14 LAB — BASIC METABOLIC PANEL
Anion gap: 9 (ref 5–15)
BUN: 30 mg/dL — ABNORMAL HIGH (ref 8–23)
CO2: 18 mmol/L — ABNORMAL LOW (ref 22–32)
Calcium: 8.7 mg/dL — ABNORMAL LOW (ref 8.9–10.3)
Chloride: 107 mmol/L (ref 98–111)
Creatinine, Ser: 1.04 mg/dL — ABNORMAL HIGH (ref 0.44–1.00)
GFR, Estimated: 50 mL/min — ABNORMAL LOW (ref >60)
Glucose, Bld: 84 mg/dL (ref 70–99)
Potassium: 4.5 mmol/L (ref 3.5–5.1)
Sodium: 134 mmol/L — ABNORMAL LOW (ref 135–145)

## 2021-02-14 NOTE — Telephone Encounter (Signed)
Pt made aware

## 2021-02-14 NOTE — Telephone Encounter (Signed)
Pt wants to make [place] aware that she has not started the HCTZ 12.5mg  that was advised at last office visit on 02/09/21.  She was concerned of lowering BP incr her risk for falls.  She has been checking BP at home for the past week and also reports that her ankle swelling has decreased.  She would like to know if ok to hold off on starting at this time.   BP readings: AM 154/83 ( per pt this is the only BP that was taken BEFORE AM medications) PM 132/76 AM 126/76 PM 136/78 AM 117/70 PM 125/76 This morning 119/69  She does continue to follow low salt diet and is going to elevate legs while sitting.

## 2021-02-14 NOTE — Telephone Encounter (Signed)
Okay to hold off on medication changes at this time. If BP is consistently >130/80 recommend she contact our office.   THanks,  Alver Sorrow, NP

## 2021-02-15 ENCOUNTER — Ambulatory Visit: Payer: Medicare Other

## 2021-02-15 ENCOUNTER — Encounter: Payer: Self-pay | Admitting: Physical Therapy

## 2021-02-15 DIAGNOSIS — R2681 Unsteadiness on feet: Secondary | ICD-10-CM

## 2021-02-15 DIAGNOSIS — G8929 Other chronic pain: Secondary | ICD-10-CM

## 2021-02-15 DIAGNOSIS — M6281 Muscle weakness (generalized): Secondary | ICD-10-CM

## 2021-02-15 DIAGNOSIS — M25511 Pain in right shoulder: Secondary | ICD-10-CM

## 2021-02-15 NOTE — Therapy (Signed)
[facility] PHYS[facility]ICINE [address] [place], [address] Phone: [phone]   Fax:  [phone]  Physical Therapy Treatment  Patient Details  Name: Elizabeth [name] MRN: [id number] Date of Birth: 1927/03/27 Referring Provider (PT): Dale Greensburg, MD   Encounter Date: 02/15/2021   PT End of Session - 02/15/21 1639    Visit Number 4    Number of Visits 16    Date for PT Re-Evaluation 03/29/21    Authorization Type [employer] Medicare reporting period from 02/01/2021    PT Start Time 1547    PT Stop Time 1632    PT Time Calculation (min) 45 min    Activity Tolerance Patient tolerated treatment well    Behavior During Therapy [facility] for tasks assessed/performed           Past Medical History:  Diagnosis Date  . Cystocele or rectocele with incomplete uterine prolapse   . Hyperlipidemia   . Hypertension   . Irritable bowel syndrome   . Palpitations   . Vaginal atrophy     Past Surgical History:  Procedure Laterality Date  . CATARACT EXTRACTION  2006    There were no vitals filed for this visit.   Subjective Assessment - 02/15/21 1550    Subjective Patient reported that she been checking her BP since her last visit, has also seen the cardiologist. They wanted her to start a new medicine but she did not take it, MD made aware of this decision by the patient. Did state that her labs were abnormal and her K+ was back to normal now on lab recheck. Did state last time she was here for PT she was having a lot of unsteadiness, and was not feeling well.    Pertinent History Patient is a 87 y.o. female who presents to outpatient physical therapy with a referral for medical diagnosis weakness. This patient's chief complaints consist of feeling like she is losing strength and loses her balance sometimes leading to the following functional deficits: difficulty maintaining her previous activity level and concern about being imbalanced, heavy  household chores, etc.   Relevant past medical history and comorbidities include hypertension, rhythm irregularity, heart valve problem (mointors by her doctor), R shoulder pain, allergic to house dust, type 2 diabetes, CKD, decreased hearing, R shoulder pain, dizziness, cystocele and uterine prolapse, cataract surgery, heartburn. Patient denies hx of cancer, stroke, seizures, lung problem, major cardiac events, diabetes, unexplained weight loss, changes in bowel or bladder problems, new onset stumbling or dropping things apart from described below.    Limitations Lifting;Standing;Walking;House hold activities   difficulty maintaining previous level of activities   Patient Stated Goals to get stronger and have better balance    Currently in Pain? No/denies            TREATMENT: At start of session: BP: 158/74 100% HR 70 After nustep: BP: 167/83 100% HR72 after nu step After 5 minute rest:  145/85 HR: 65     INTERVENTION THIS DATE: -NuStep level0using bilateral upper and lower extremities. Seat/handle setting 4/9. For improved extremity mobility, muscular endurance, and activity tolerance; and to induce the analgesic effect of aerobic exercise, stimulate improved joint nutrition, and prepare body structures and systems for following interventions. x6Minutes. Average SPM = 63   -tandem stance head turns/nods 2x10 ea direction   HEP updated and performed:  Exercises Tandem Stance with Head Rotation with single UE support- 2 sets - 10 reps Straight Leg Raise (pt  reported performing bilateral leg raise at home, verbalized understanding) Bridge (pt verbalized understanding) Standing March with Counter Support 2 sets - 10 reps Standing Hip Abduction with Counter Support 2 sets - 10 reps Standing Hip Extension with Counter Support 2 sets - 10 reps Heel rises with counter support - 2sets - 10 reps   Access Code: [email or web address] URL: [email or web address] Date:  02/15/2021 Prepared by: Olga Coaster    Pt response/clinical impression: the patient demonstrated improved activity tolerance compared to previous session, BP assessed several times to ensure pt safety. PT did report increased anxiety while in PT clinic, often reported at home with exercises she does not get short of breath. Exercises performed with several PT led seated rest breaks to allow pt to rest prior to continuation of therapy. Pt HEP updated, verbalized and demonstrated understanding, and pt educated to perform as able at home in respect to SOB. The patient would benefit from further skilled PT intervention to continue to progress towards goals.     PT Education - 02/15/21 1554    Education Details exercise form/technique    Person(s) Educated Patient    Methods Explanation;Demonstration;Tactile cues;Verbal cues    Comprehension Verbalized understanding;Returned demonstration;Verbal cues required;Tactile cues required;Need further instruction            PT Short Term Goals - 02/01/21 1946      PT SHORT TERM GOAL #1   Title Be independent with initial home exercise program for self-management of symptoms.    Baseline Initial HEP to be provided visit 2 as appropriate (02/01/2021);    Time 3    Period Weeks    Status New    Target Date 02/22/21             PT Long Term Goals - 02/01/21 1946      PT LONG TERM GOAL #1   Title Be independent with a long-term home exercise program for self-management of symptoms.    Baseline Initial HEP to be provided visit 2 as appropriate (02/01/2021);    Time 12    Period Weeks    Status New   TARGET DATE FOR ALL LONG TERM GOALS: 04/26/2021     PT LONG TERM GOAL #2   Title Demonstrate improved FOTO score by 10 units to demonstrate improvement in overall condition and self-reported functional ability.    Baseline to be tested vist 2 as appropriate (02/01/2021);    Time 12    Period Weeks    Status New      PT LONG TERM GOAL #3   Title  Patient will demonstrate improved FGA to equal or more than 25/30 to demonstrate improved fall risk to low to improve patient's ability to complete housework and mobility safetly.    Baseline 22/30 (02/01/2021);    Time 12    Period Weeks    Status New      PT LONG TERM GOAL #4   Title Patient will demonstrate floor to waist lift of equal or greater than 20# x 10 reps with good form to improve her ability to complete heavy housework and lift groceries at home.    Baseline to be tested at visit 2 as apporpriate (02/01/2021):    Time 12    Period Weeks    Status New      PT LONG TERM GOAL #5   Title Complete community, work and/or recreational activities without limitation due to current condition.    Baseline limiting ability to complete her usual level of  activity, ambulate safely, manage her home alone, and complete heavy household chores without difficulty (02/01/2021);    Time 12    Period Weeks    Status New                 Plan - 02/15/21 1554    Clinical Impression Statement the patient demonstrated improved activity tolerance compared to previous session, BP assessed several times to ensure pt safety. PT did report increased anxiety while in PT clinic, often reported at home with exercises she does not get short of breath. Exercises performed with several PT led seated rest breaks to allow pt to rest prior to continuation of therapy. Pt HEP updated, verbalized and demonstrated understanding, and pt educated to perform as able at home in respect to SOB. The patient would benefit from further skilled PT intervention to continue to progress towards goals.    Personal Factors and Comorbidities Age;Comorbidity 3+    Comorbidities hypertension, rhythm irregularity, heart valve problem (mointors by her doctor), R shoulder pain, allergic to house dust, type 2 diabetes, CKD, decreased hearing, R shoulder pain, dizziness, cystocele and uterine prolapse, cataract surgery, heartburn.     Examination-Activity Limitations Locomotion Level;Lift;Reach Overhead;Carry;Stairs;Other    Examination-Participation Restrictions Cleaning;Yard Work;Community Activity    Stability/Clinical Decision Making Stable/Uncomplicated    Rehab Potential Good    PT Frequency 2x / week    PT Duration 12 weeks    PT Treatment/Interventions ADLs/Self Care Home Management;Cryotherapy;Moist Heat;Electrical Stimulation;DME Instruction;Functional mobility training;Therapeutic activities;Therapeutic exercise;Balance training;Neuromuscular re-education;Patient/family education;Gait training;Manual techniques;Dry needling;Passive range of motion;Spinal Manipulations;Visual/perceptual remediation/compensation;Joint Manipulations    PT Next Visit Plan update HEP, balance, strength training    PT Home Exercise Plan Medbridge Access Code: [email or web address]    Consulted and Agree with Plan of Care Patient           Patient will benefit from skilled therapeutic intervention in order to improve the following deficits and impairments:  Dizziness,Pain,Decreased coordination,Decreased mobility,Impaired UE functional use,Impaired perceived functional ability,Decreased strength,Decreased endurance,Decreased activity tolerance,Decreased balance  Visit Diagnosis: Unsteadiness on feet  Muscle weakness (generalized)  Chronic right shoulder pain     Problem List Patient Active Problem List   Diagnosis Date Noted  . Right shoulder pain 10/02/2020  . CKD (chronic kidney disease) stage 3, GFR 30-59 ml/min (HCC) 10/02/2020  . Elevated TSH 10/02/2020  . Weakness 05/29/2020  . Type 2 diabetes mellitus with hyperglycemia (HCC) 01/07/2020  . GERD (gastroesophageal reflux disease) 11/09/2018  . Dizziness 06/02/2017  . Vaginal atrophy 09/27/2016  . SVT (supraventricular tachycardia) (HCC) 01/27/2016  . Headache 01/26/2016  . Left arm numbness 01/26/2016  . Urethral caruncle 12/23/2015  . Vaginal pessary in situ 09/22/2015  .  Health care maintenance 07/22/2015  . Baden-Walker grade 4 cystocele 06/26/2015  . First degree uterine prolapse 06/26/2015  . Hyperkalemia 04/27/2015  . Arrhythmia 04/27/2015  . SOB (shortness of breath) 04/27/2015  . Double vision 07/18/2014  . Decreased hearing 07/18/2014  . Stress 07/18/2014  . Fatigue 07/18/2014  . Environmental allergies 07/09/2013  . Hypertension 12/09/2012  . Hypercholesterolemia 12/09/2012    Olga Coaster PT, DPT 4:40 PM,02/15/21   [facility] PHYS[facility] SPORTS MEDICINE [address], [place], [address] Phone: [phone]   Fax:  [phone]  Name: Elizabeth [name] MRN: [id number] Date of Birth: [birth year]

## 2021-02-21 ENCOUNTER — Ambulatory Visit: Payer: Medicare Other | Attending: Internal Medicine | Admitting: Physical Therapy

## 2021-02-21 ENCOUNTER — Other Ambulatory Visit: Payer: Self-pay

## 2021-02-21 ENCOUNTER — Encounter: Payer: Self-pay | Admitting: Physical Therapy

## 2021-02-21 VITALS — BP 150/70 | HR 69

## 2021-02-21 DIAGNOSIS — G8929 Other chronic pain: Secondary | ICD-10-CM

## 2021-02-21 DIAGNOSIS — R2681 Unsteadiness on feet: Secondary | ICD-10-CM

## 2021-02-21 DIAGNOSIS — M25511 Pain in right shoulder: Secondary | ICD-10-CM | POA: Insufficient documentation

## 2021-02-21 DIAGNOSIS — M6281 Muscle weakness (generalized): Secondary | ICD-10-CM | POA: Diagnosis present

## 2021-02-21 NOTE — Therapy (Signed)
[facility] PHYS[facility]TS [facility]ICINE [address] [place], [address] Phone: [phone]   Fax:  [phone]  Physical Therapy Treatment  Patient Details  Name: Elizabeth [name] MRN: [id number] Date of Birth: [birth year] Referring Provider (PT): Dale Hughes, MD   Encounter Date: 02/21/2021   PT End of Session - 02/21/21 1609    Visit Number 5    Number of Visits 16    Date for PT Re-Evaluation 03/29/21    Authorization Type [employer] Medicare reporting period from 02/01/2021    Progress Note Due on Visit 10    PT Start Time 1518    PT Stop Time 1556    PT Time Calculation (min) 38 min    Activity Tolerance Patient tolerated treatment well    Behavior During Therapy [facility] for tasks assessed/performed           Past Medical History:  Diagnosis Date  . Cystocele or rectocele with incomplete uterine prolapse   . Hyperlipidemia   . Hypertension   . Irritable bowel syndrome   . Palpitations   . Vaginal atrophy     Past Surgical History:  Procedure Laterality Date  . CATARACT EXTRACTION  2006    Vitals:   02/21/21 1530  BP: (!) 150/70  Pulse: 69  SpO2: 98%     Subjective Assessment - 02/21/21 1519    Subjective Pateint reports she is feeling more unteady this afternoon after standing and painting all morning. States this happens to her sometimes. Says she has not been taking her BP quite so often in the last few days but she doesn't think her blood pressure is stable right now but she is unsure why. She notes that the exercises she did last session she has not . She does note that she feels an adrenaline rush when just talking to someone and wonders if this is affecting her BP. She continues to be concerned that her brand name BP medication was changed to genaric. She had a history of weakness in her legs when she tried to switch in the past and Dr. Lorin Picket worked on her with this. She states she has to petition to get name brand each  year and she did not do this year so she started the genaric in November and she wonders if this is having an effect on her BP.  Her BP was fairly low this morning. Reports she is continuing to do her pre-existing daily exercises (but did not add 3# weights to squats), and she has added some exercises from the HEP PT gave her last session. Reports no hadache.    Pertinent History Patient is a 87 y.o. female who presents to outpatient physical therapy with a referral for medical diagnosis weakness. This patient's chief complaints consist of feeling like she is losing strength and loses her balance sometimes leading to the following functional deficits: difficulty maintaining her previous activity level and concern about being imbalanced, heavy household chores, etc.   Relevant past medical history and comorbidities include hypertension, rhythm irregularity, heart valve problem (mointors by her doctor), R shoulder pain, allergic to house dust, type 2 diabetes, CKD, decreased hearing, R shoulder pain, dizziness, cystocele and uterine prolapse, cataract surgery, heartburn. Patient denies hx of cancer, stroke, seizures, lung problem, major cardiac events, diabetes, unexplained weight loss, changes in bowel or bladder problems, new onset stumbling or dropping things apart from described below.    Limitations Lifting;Standing;Walking;House hold activities   difficulty maintaining previous  level of activities   Patient Stated Goals to get stronger and have better balance    Currently in Pain? No/denies           TREATMENT: At start of session: BP: 150/70 mmHg, SpO2 98%,  HR 69 bpm  TREATMENT:   Therapeutic exercise: to centralize symptoms and improve ROM, strength, muscular endurance, and activity tolerance required for successful completion of functional activities.  - vitals check to assess safety of exercise.  -NuStep level0-1using bilateral upper and lower extremities. Seat/handle setting 4/9. For  improved extremity mobility, muscular endurance, and activity tolerance; and to induce the analgesic effect of aerobic exercise, stimulate improved joint nutrition, and prepare body structures and systems for following interventions. x6Minutes. Average SPM = 59 - RDL  c freeweight 3x10 c 04/29/09# free weight (mild shortness of breath after last set, noted 10# felt like starting to be work).  - tandem stance with horizontal cervical spine rotation 1x20 with each foot forward. SBA for safety  Pt required multimodal cuing for proper technique and to facilitate improved neuromuscular control, strength, range of motion, and functional ability resulting in improved performance and form.    HOME EXERCISE PROGRAM  Access Code: [email or web address] URL: [email or web address] Date: 02/15/2021 Prepared by: Olga Coaster  Exercises Tandem Stance with Head Rotation with single UE support- 2 sets - 10 reps Straight Leg Raise (pt reported performing bilateral leg raise at home, verbalized understanding) Bridge (pt verbalized understanding) Standing March with Counter Support 2 sets - 10 reps Standing Hip Abduction with Counter Support 2 sets - 10 reps Standing Hip Extension with Counter Support 2 sets - 10 reps Heel rises with counter support - 2sets - 10 reps    PT Education - 02/21/21 1610    Education Details exercise form/technique    Person(s) Educated Patient    Methods Explanation;Demonstration;Tactile cues;Verbal cues    Comprehension Verbalized understanding;Returned demonstration;Verbal cues required;Tactile cues required;Need further instruction            PT Short Term Goals - 02/21/21 1608      PT SHORT TERM GOAL #1   Title Be independent with initial home exercise program for self-management of symptoms.    Baseline Initial HEP to be provided visit 2 as appropriate (02/01/2021);    Time 3    Period Weeks    Status Achieved    Target Date 02/22/21             PT  Long Term Goals - 02/01/21 1946      PT LONG TERM GOAL #1   Title Be independent with a long-term home exercise program for self-management of symptoms.    Baseline Initial HEP to be provided visit 2 as appropriate (02/01/2021);    Time 12    Period Weeks    Status New   TARGET DATE FOR ALL LONG TERM GOALS: 04/26/2021     PT LONG TERM GOAL #2   Title Demonstrate improved FOTO score by 10 units to demonstrate improvement in overall condition and self-reported functional ability.    Baseline to be tested vist 2 as appropriate (02/01/2021);    Time 12    Period Weeks    Status New      PT LONG TERM GOAL #3   Title Patient will demonstrate improved FGA to equal or more than 25/30 to demonstrate improved fall risk to low to improve patient's ability to complete housework and mobility safetly.    Baseline 22/30 (02/01/2021);    Time 12  Period Weeks    Status New      PT LONG TERM GOAL #4   Title Patient will demonstrate floor to waist lift of equal or greater than 20# x 10 reps with good form to improve her ability to complete heavy housework and lift groceries at home.    Baseline to be tested at visit 2 as apporpriate (02/01/2021):    Time 12    Period Weeks    Status New      PT LONG TERM GOAL #5   Title Complete community, work and/or recreational activities without limitation due to current condition.    Baseline limiting ability to complete her usual level of activity, ambulate safely, manage her home alone, and complete heavy household chores without difficulty (02/01/2021);    Time 12    Period Weeks    Status New                 Plan - 02/21/21 1608    Clinical Impression Statement Patient vitals WFL at start of session and patient asymptomatic. Does get quite excited and comes across as anxious when interacting with PT, that likely increased her blood pressure measurement slightly at start of session. Monitored patient's symptoms closely throughout session. Patient very  verbose and needed extended time to discuss symptoms and recent and prior history. Reports long history of sudden shortness of breath when sitting doing nothing. Did report some feeling of shortness of breath most pronounced after 10 reps of dead lift with 10# DB but did not demonstrate any objective signs outside expected response. Discussed expected increase in systolic BP and HR and feeling of shortness of breath with effortful exercise with multiple reps. Patient continued to report feeling of imbalance she associates with her vestibular symptoms over decades and is usual for her. Gradually increased strengthening activities as tolerated to allow patient to progress in functional LE strength with no unacceptable response. Patient would benefit from continued management of limiting condition by skilled physical therapist to address remaining impairments and functional limitations to work towards stated goals and return to PLOF or maximal functional independence.    Personal Factors and Comorbidities Age;Comorbidity 3+    Comorbidities hypertension, rhythm irregularity, heart valve problem (mointors by her doctor), R shoulder pain, allergic to house dust, type 2 diabetes, CKD, decreased hearing, R shoulder pain, dizziness, cystocele and uterine prolapse, cataract surgery, heartburn.    Examination-Activity Limitations Locomotion Level;Lift;Reach Overhead;Carry;Stairs;Other    Examination-Participation Restrictions Cleaning;Yard Work;Community Activity    Stability/Clinical Decision Making Stable/Uncomplicated    Rehab Potential Good    PT Frequency 2x / week    PT Duration 12 weeks    PT Treatment/Interventions ADLs/Self Care Home Management;Cryotherapy;Moist Heat;Electrical Stimulation;DME Instruction;Functional mobility training;Therapeutic activities;Therapeutic exercise;Balance training;Neuromuscular re-education;Patient/family education;Gait training;Manual techniques;Dry needling;Passive range of  motion;Spinal Manipulations;Visual/perceptual remediation/compensation;Joint Manipulations    PT Next Visit Plan update HEP, balance, strength training    PT Home Exercise Plan Medbridge Access Code: [email or web address]    Consulted and Agree with Plan of Care Patient           Patient will benefit from skilled therapeutic intervention in order to improve the following deficits and impairments:  Dizziness,Pain,Decreased coordination,Decreased mobility,Impaired UE functional use,Impaired perceived functional ability,Decreased strength,Decreased endurance,Decreased activity tolerance,Decreased balance  Visit Diagnosis: Unsteadiness on feet  Muscle weakness (generalized)  Chronic right shoulder pain     Problem List Patient Active Problem List   Diagnosis Date Noted  . Right shoulder pain 10/02/2020  . CKD (chronic kidney disease)  stage 3, GFR 30-59 ml/min (HCC) 10/02/2020  . Elevated TSH 10/02/2020  . Weakness 05/29/2020  . Type 2 diabetes mellitus with hyperglycemia (HCC) 01/07/2020  . GERD (gastroesophageal reflux disease) 11/09/2018  . Dizziness 06/02/2017  . Vaginal atrophy 09/27/2016  . SVT (supraventricular tachycardia) (HCC) 01/27/2016  . Headache 01/26/2016  . Left arm numbness 01/26/2016  . Urethral caruncle 12/23/2015  . Vaginal pessary in situ 09/22/2015  . Health care maintenance 07/22/2015  . Baden-Walker grade 4 cystocele 06/26/2015  . First degree uterine prolapse 06/26/2015  . Hyperkalemia 04/27/2015  . Arrhythmia 04/27/2015  . SOB (shortness of breath) 04/27/2015  . Double vision 07/18/2014  . Decreased hearing 07/18/2014  . Stress 07/18/2014  . Fatigue 07/18/2014  . Environmental allergies 07/09/2013  . Hypertension 12/09/2012  . Hypercholesterolemia 12/09/2012    Luretha Murphy. Ilsa Iha, PT, DPT 02/21/21, 4:11 PM  [facility] PHYSICAL AND SPORTS MEDICINE [address], [place], [address] Phone: [phone]   Fax:   [phone]  Name: ADELAI ACHEY MRN: [id number] Date of Birth: Sep 16, 1927

## 2021-02-27 ENCOUNTER — Encounter: Payer: Self-pay | Admitting: Physical Therapy

## 2021-02-27 ENCOUNTER — Other Ambulatory Visit: Payer: Self-pay

## 2021-02-27 ENCOUNTER — Ambulatory Visit: Payer: Medicare Other | Admitting: Physical Therapy

## 2021-02-27 VITALS — BP 144/76 | HR 64

## 2021-02-27 DIAGNOSIS — M6281 Muscle weakness (generalized): Secondary | ICD-10-CM

## 2021-02-27 DIAGNOSIS — R2681 Unsteadiness on feet: Secondary | ICD-10-CM

## 2021-02-27 DIAGNOSIS — G8929 Other chronic pain: Secondary | ICD-10-CM

## 2021-02-27 NOTE — Therapy (Signed)
[facility] PHYS[facility] AND SPORTS MEDICINE [address] [place], [address] Phone: [phone]   Fax:  [phone]  Physical Therapy Treatment  Patient Details  Name: Elizabeth [name] MRN: [id number] Date of Birth: 1927/09/14 Referring Provider (PT): Dale , MD   Encounter Date: 02/27/2021   PT End of Session - 02/27/21 1317    Visit Number 6    Number of Visits 16    Date for PT Re-Evaluation 03/29/21    Authorization Type [employer] Medicare reporting period from 02/01/2021    Progress Note Due on Visit 10    PT Start Time 0100    PT Stop Time 0140    PT Time Calculation (min) 40 min    Equipment Utilized During Treatment Gait belt    Activity Tolerance Patient tolerated treatment well    Behavior During Therapy [facility] for tasks assessed/performed           Past Medical History:  Diagnosis Date  . Cystocele or rectocele with incomplete uterine prolapse   . Hyperlipidemia   . Hypertension   . Irritable bowel syndrome   . Palpitations   . Vaginal atrophy     Past Surgical History:  Procedure Laterality Date  . CATARACT EXTRACTION  2006    Vitals:   02/27/21 1306  BP: (!) 144/76  Pulse: 64  SpO2: 98%     Subjective Assessment - 02/27/21 1305    Subjective Patient reports she is feeling well today. No pain upon arrival. Had one episode of shortness of breath since last treatment session while she was in bed and it lasted a while. Felt fine following last session. Her daily exercise is going well except she was lazy over the weekend and didn't do everything she meant to do.    Pertinent History Patient is a 87 y.o. female who presents to outpatient physical therapy with a referral for medical diagnosis weakness. This patient's chief complaints consist of feeling like she is losing strength and loses her balance sometimes leading to the following functional deficits: difficulty maintaining her previous activity level and concern  about being imbalanced, heavy household chores, etc.   Relevant past medical history and comorbidities include hypertension, rhythm irregularity, heart valve problem (mointors by her doctor), R shoulder pain, allergic to house dust, type 2 diabetes, CKD, decreased hearing, R shoulder pain, dizziness, cystocele and uterine prolapse, cataract surgery, heartburn. Patient denies hx of cancer, stroke, seizures, lung problem, major cardiac events, diabetes, unexplained weight loss, changes in bowel or bladder problems, new onset stumbling or dropping things apart from described below.    Limitations Lifting;Standing;Walking;House hold activities   difficulty maintaining previous level of activities   Patient Stated Goals to get stronger and have better balance    Currently in Pain? No/denies            TREATMENT:   Therapeutic exercise:to centralize symptoms and improve ROM, strength, muscular endurance, and activity tolerance required for successful completion of functional activities.  - vitals check to assess safety of exercise [facility] see above) -NuStep level2using bilateral upper and lower extremities. Seat/handle setting4/9. For improved extremity mobility, muscular endurance, and activity tolerance; and to induce the analgesic effect of aerobic exercise, stimulate improved joint nutrition, and prepare body structures and systems for following interventions. x6Minutes. Average SPM = 59 - RDL c freeweight 3x10 c 10# free weight.  - mini air squats with 3# DB bicep curl, both sides, 1x30 (as patient completes at home)  Neuromuscular  Re-education: to improve, balance, postural strength, muscle activation patterns, and stabilization strength required for functional activities: - alternating double taps on cone with intermittant unilateral UE support, 2x20 each side. CGA for safety. - step over and back in star shape over 6 inch hurdles, x5 rounds each side, forward, forward diagonal, backwards  diagonal, backwards, crossed diagonal backwards.    Pt required multimodal cuing for proper technique and to facilitate improved neuromuscular control, strength, range of motion, and functional ability resulting in improved performance and form.  HOME EXERCISE PROGRAM Access Code: [email or web address] URL: [email or web address] Date: 02/15/2021 Prepared by: Olga Coaster  Exercises Tandem Stance with Head Rotationwith single UE support- 2 sets -10 reps Straight Leg Raise(pt reported performing bilateral leg raise at home, verbalized understanding) Bridge(pt verbalized understanding) Standing March with Counter Support2sets - 10 reps Standing Hip Abduction with Counter Support2sets - 10 reps Standing Hip Extension with Counter Support2sets - 10 reps Heel rises with counter support -2sets - 10 reps      PT Education - 02/27/21 1310    Education Details exercise form/technique    Person(s) Educated Patient    Methods Explanation;Demonstration;Tactile cues;Verbal cues    Comprehension Verbalized understanding;Returned demonstration;Verbal cues required;Tactile cues required;Need further instruction            PT Short Term Goals - 02/21/21 1608      PT SHORT TERM GOAL #1   Title Be independent with initial home exercise program for self-management of symptoms.    Baseline Initial HEP to be provided visit 2 as appropriate (02/01/2021);    Time 3    Period Weeks    Status Achieved    Target Date 02/22/21             PT Long Term Goals - 02/01/21 1946      PT LONG TERM GOAL #1   Title Be independent with a long-term home exercise program for self-management of symptoms.    Baseline Initial HEP to be provided visit 2 as appropriate (02/01/2021);    Time 12    Period Weeks    Status New   TARGET DATE FOR ALL LONG TERM GOALS: 04/26/2021     PT LONG TERM GOAL #2   Title Demonstrate improved FOTO score by 10 units to demonstrate improvement in overall condition  and self-reported functional ability.    Baseline to be tested vist 2 as appropriate (02/01/2021);    Time 12    Period Weeks    Status New      PT LONG TERM GOAL #3   Title Patient will demonstrate improved FGA to equal or more than 25/30 to demonstrate improved fall risk to low to improve patient's ability to complete housework and mobility safetly.    Baseline 22/30 (02/01/2021);    Time 12    Period Weeks    Status New      PT LONG TERM GOAL #4   Title Patient will demonstrate floor to waist lift of equal or greater than 20# x 10 reps with good form to improve her ability to complete heavy housework and lift groceries at home.    Baseline to be tested at visit 2 as apporpriate (02/01/2021):    Time 12    Period Weeks    Status New      PT LONG TERM GOAL #5   Title Complete community, work and/or recreational activities without limitation due to current condition.    Baseline limiting ability to complete her usual level of activity,  ambulate safely, manage her home alone, and complete heavy household chores without difficulty (02/01/2021);    Time 12    Period Weeks    Status New                 Plan - 02/27/21 1344    Clinical Impression Statement Patient tolerated treatment well overall with no excessive shortness of breath. Able to advance strength and balance exercise. Patient would benefit from continued management of limiting condition by skilled physical therapist to address remaining impairments and functional limitations to work towards stated goals and return to PLOF or maximal functional independence.    Personal Factors and Comorbidities Age;Comorbidity 3+    Comorbidities hypertension, rhythm irregularity, heart valve problem (mointors by her doctor), R shoulder pain, allergic to house dust, type 2 diabetes, CKD, decreased hearing, R shoulder pain, dizziness, cystocele and uterine prolapse, cataract surgery, heartburn.    Examination-Activity Limitations Locomotion  Level;Lift;Reach Overhead;Carry;Stairs;Other    Examination-Participation Restrictions Cleaning;Yard Work;Community Activity    Stability/Clinical Decision Making Stable/Uncomplicated    Rehab Potential Good    PT Frequency 2x / week    PT Duration 12 weeks    PT Treatment/Interventions ADLs/Self Care Home Management;Cryotherapy;Moist Heat;Electrical Stimulation;DME Instruction;Functional mobility training;Therapeutic activities;Therapeutic exercise;Balance training;Neuromuscular re-education;Patient/family education;Gait training;Manual techniques;Dry needling;Passive range of motion;Spinal Manipulations;Visual/perceptual remediation/compensation;Joint Manipulations    PT Next Visit Plan update HEP, balance, strength training    PT Home Exercise Plan Medbridge Access Code: [email or web address]    Consulted and Agree with Plan of Care Patient           Patient will benefit from skilled therapeutic intervention in order to improve the following deficits and impairments:  Dizziness,Pain,Decreased coordination,Decreased mobility,Impaired UE functional use,Impaired perceived functional ability,Decreased strength,Decreased endurance,Decreased activity tolerance,Decreased balance  Visit Diagnosis: Unsteadiness on feet  Muscle weakness (generalized)  Chronic right shoulder pain     Problem List Patient Active Problem List   Diagnosis Date Noted  . Right shoulder pain 10/02/2020  . CKD (chronic kidney disease) stage 3, GFR 30-59 ml/min (HCC) 10/02/2020  . Elevated TSH 10/02/2020  . Weakness 05/29/2020  . Type 2 diabetes mellitus with hyperglycemia (HCC) 01/07/2020  . GERD (gastroesophageal reflux disease) 11/09/2018  . Dizziness 06/02/2017  . Vaginal atrophy 09/27/2016  . SVT (supraventricular tachycardia) (HCC) 01/27/2016  . Headache 01/26/2016  . Left arm numbness 01/26/2016  . Urethral caruncle 12/23/2015  . Vaginal pessary in situ 09/22/2015  . Health care maintenance 07/22/2015  .  Baden-Walker grade 4 cystocele 06/26/2015  . First degree uterine prolapse 06/26/2015  . Hyperkalemia 04/27/2015  . Arrhythmia 04/27/2015  . SOB (shortness of breath) 04/27/2015  . Double vision 07/18/2014  . Decreased hearing 07/18/2014  . Stress 07/18/2014  . Fatigue 07/18/2014  . Environmental allergies 07/09/2013  . Hypertension 12/09/2012  . Hypercholesterolemia 12/09/2012    Luretha Murphy. Ilsa Iha, PT, DPT 02/27/21, 2:03 PM  [place] PHYSICAL AND SPORTS MEDICINE [address], [place], [address] Phone: [phone]   Fax:  [phone]  Name: Elizabeth [name] MRN: [id number] Date of Birth: Aug 24, 1927

## 2021-03-01 ENCOUNTER — Encounter: Payer: Self-pay | Admitting: Physical Therapy

## 2021-03-01 ENCOUNTER — Ambulatory Visit: Payer: Medicare Other | Admitting: Physical Therapy

## 2021-03-01 ENCOUNTER — Other Ambulatory Visit: Payer: Self-pay

## 2021-03-01 VITALS — BP 140/64 | HR 63

## 2021-03-01 DIAGNOSIS — R2681 Unsteadiness on feet: Secondary | ICD-10-CM | POA: Diagnosis not present

## 2021-03-01 DIAGNOSIS — G8929 Other chronic pain: Secondary | ICD-10-CM

## 2021-03-01 DIAGNOSIS — M6281 Muscle weakness (generalized): Secondary | ICD-10-CM

## 2021-03-01 NOTE — Therapy (Signed)
[facility] PHYS[facility]ORTS MEDICINE [address], [place], [address] Phone: [phone]   Fax:  [phone]  Physical Therapy Treatment  Patient Details  Name: Brittnay Pigman Goyal MRN: [id number] Date of Birth: 1927/05/01 Referring Provider (PT): Dale [place], MD   Encounter Date: 03/01/2021   PT End of Session - 03/01/21 1038    Visit Number 7    Number of Visits 16    Date for PT Re-Evaluation 03/29/21    Authorization Type [employer] Medicare reporting period from 02/01/2021    Progress Note Due on Visit 10    PT Start Time 1030    PT Stop Time 1110    PT Time Calculation (min) 40 min    Equipment Utilized During Treatment Gait belt    Activity Tolerance Patient tolerated treatment well    Behavior During Therapy [facility] for tasks assessed/performed           Past Medical History:  Diagnosis Date  . Cystocele or rectocele with incomplete uterine prolapse   . Hyperlipidemia   . Hypertension   . Irritable bowel syndrome   . Palpitations   . Vaginal atrophy     Past Surgical History:  Procedure Laterality Date  . CATARACT EXTRACTION  2006    Vitals:   03/01/21 1035  BP: 140/64  Pulse: 63  SpO2: 98%     Subjective Assessment - 03/01/21 1030    Subjective Patient reports she is feeling well this morning and didn't do her exercise this morning. No problem with shortness of breath since last session. BP yeasterday morning before medication 116/62 mmHg. No pain upon arrival. Had a headache when she got up but the air in her apartment is not great. Later report she also has felt a little short of breath this morning.    Pertinent History Patient is a 87 y.o. female who presents to outpatient physical therapy with a referral for medical diagnosis weakness. This patient's chief complaints consist of feeling like she is losing strength and loses her balance sometimes leading to the following functional deficits: difficulty maintaining her  previous activity level and concern about being imbalanced, heavy household chores, etc.   Relevant past medical history and comorbidities include hypertension, rhythm irregularity, heart valve problem (mointors by her doctor), R shoulder pain, allergic to house dust, type 2 diabetes, CKD, decreased hearing, R shoulder pain, dizziness, cystocele and uterine prolapse, cataract surgery, heartburn. Patient denies hx of cancer, stroke, seizures, lung problem, major cardiac events, diabetes, unexplained weight loss, changes in bowel or bladder problems, new onset stumbling or dropping things apart from described below.    Limitations Lifting;Standing;Walking;House hold activities   difficulty maintaining previous level of activities   Patient Stated Goals to get stronger and have better balance    Currently in Pain? No/denies             TREATMENT:  Therapeutic exercise:to centralize symptoms and improve ROM, strength, muscular endurance, and activity tolerance required for successful completion of functional activities. - vitals check to assess safety of exercise [facility] see above) -NuStep level3using bilateral upper and lower extremities. Seat/handle setting4/9. For improved extremity mobility, muscular endurance, and activity tolerance; and to induce the analgesic effect of aerobic exercise, stimulate improved joint nutrition, and prepare body structures and systems for following interventions. x6Minutes. Average SPM =56 - RDL c freeweight3x8/10/8 c15#free weight.(felt uncomfortably out of breath after set of 10).  - squat with buttocks tap with 3# DB bicep curl, both  sides, 2x8 (fatigues quickly)   Neuromuscular Re-education: to improve, balance, postural strength, muscle activation patterns, and stabilization strength required for functional activities: - alternating double taps on cone with no UE support, 2x20 each side with stepping pattern and color naming in box pattern on floor  CGA for safety.  Pt required multimodal cuing for proper technique and to facilitate improved neuromuscular control, strength, range of motion, and functional ability resulting in improved performance and form.  HOME EXERCISE PROGRAM Access Code: [email or web address] URL: [email or web address] Date: 02/15/2021 Prepared by: Olga Coaster  Exercises Tandem Stance with Head Rotationwith single UE support- 2 sets -10 reps Straight Leg Raise(pt reported performing bilateral leg raise at home, verbalized understanding) Bridge(pt verbalized understanding) Standing March with Counter Support2sets - 10 reps Standing Hip Abduction with Counter Support2sets - 10 reps Standing Hip Extension with Counter Support2sets - 10 reps Heel rises with counter support -2sets - 10 reps    PT Education - 03/01/21 1038    Education Details exercise form/technique    Person(s) Educated Patient    Methods Explanation;Demonstration;Tactile cues;Verbal cues    Comprehension Verbalized understanding;Returned demonstration;Verbal cues required;Tactile cues required;Need further instruction            PT Short Term Goals - 02/21/21 1608      PT SHORT TERM GOAL #1   Title Be independent with initial home exercise program for self-management of symptoms.    Baseline Initial HEP to be provided visit 2 as appropriate (02/01/2021);    Time 3    Period Weeks    Status Achieved    Target Date 02/22/21             PT Long Term Goals - 02/01/21 1946      PT LONG TERM GOAL #1   Title Be independent with a long-term home exercise program for self-management of symptoms.    Baseline Initial HEP to be provided visit 2 as appropriate (02/01/2021);    Time 12    Period Weeks    Status New   TARGET DATE FOR ALL LONG TERM GOALS: 04/26/2021     PT LONG TERM GOAL #2   Title Demonstrate improved FOTO score by 10 units to demonstrate improvement in overall condition and self-reported functional  ability.    Baseline to be tested vist 2 as appropriate (02/01/2021);    Time 12    Period Weeks    Status New      PT LONG TERM GOAL #3   Title Patient will demonstrate improved FGA to equal or more than 25/30 to demonstrate improved fall risk to low to improve patient's ability to complete housework and mobility safetly.    Baseline 22/30 (02/01/2021);    Time 12    Period Weeks    Status New      PT LONG TERM GOAL #4   Title Patient will demonstrate floor to waist lift of equal or greater than 20# x 10 reps with good form to improve her ability to complete heavy housework and lift groceries at home.    Baseline to be tested at visit 2 as apporpriate (02/01/2021):    Time 12    Period Weeks    Status New      PT LONG TERM GOAL #5   Title Complete community, work and/or recreational activities without limitation due to current condition.    Baseline limiting ability to complete her usual level of activity, ambulate safely, manage her home alone, and complete heavy household chores without  difficulty (02/01/2021);    Time 12    Period Weeks    Status New                 Plan - 03/01/21 1924    Clinical Impression Statement Patient tolerated treatment well overall but continues to be limited by sensation of being short of breath. Does not appear to be breathing heavily per observation. Did perspire easily this session while wearing a sweater. Vitals monitored for safety. Patient provided with extra rests and limited reps to mitigate shortness of breath. Continued to discuss with her appropriate response to exertion vs abnormal response to be concerned with. Patient would benefit from continued management of limiting condition by skilled physical therapist to address remaining impairments and functional limitations to work towards stated goals and return to PLOF or maximal functional independence.    Personal Factors and Comorbidities Age;Comorbidity 3+    Comorbidities hypertension,  rhythm irregularity, heart valve problem (mointors by her doctor), R shoulder pain, allergic to house dust, type 2 diabetes, CKD, decreased hearing, R shoulder pain, dizziness, cystocele and uterine prolapse, cataract surgery, heartburn.    Examination-Activity Limitations Locomotion Level;Lift;Reach Overhead;Carry;Stairs;Other    Examination-Participation Restrictions Cleaning;Yard Work;Community Activity    Stability/Clinical Decision Making Stable/Uncomplicated    Rehab Potential Good    PT Frequency 2x / week    PT Duration 12 weeks    PT Treatment/Interventions ADLs/Self Care Home Management;Cryotherapy;Moist Heat;Electrical Stimulation;DME Instruction;Functional mobility training;Therapeutic activities;Therapeutic exercise;Balance training;Neuromuscular re-education;Patient/family education;Gait training;Manual techniques;Dry needling;Passive range of motion;Spinal Manipulations;Visual/perceptual remediation/compensation;Joint Manipulations    PT Next Visit Plan balance, strength training    PT Home Exercise Plan Medbridge Access Code: [email or web address]    Consulted and Agree with Plan of Care Patient           Patient will benefit from skilled therapeutic intervention in order to improve the following deficits and impairments:  Dizziness,Pain,Decreased coordination,Decreased mobility,Impaired UE functional use,Impaired perceived functional ability,Decreased strength,Decreased endurance,Decreased activity tolerance,Decreased balance  Visit Diagnosis: Unsteadiness on feet  Muscle weakness (generalized)  Chronic right shoulder pain     Problem List Patient Active Problem List   Diagnosis Date Noted  . Right shoulder pain 10/02/2020  . CKD (chronic kidney disease) stage 3, GFR 30-59 ml/min (HCC) 10/02/2020  . Elevated TSH 10/02/2020  . Weakness 05/29/2020  . Type 2 diabetes mellitus with hyperglycemia (HCC) 01/07/2020  . GERD (gastroesophageal reflux disease) 11/09/2018  . Dizziness  06/02/2017  . Vaginal atrophy 09/27/2016  . SVT (supraventricular tachycardia) (HCC) 01/27/2016  . Headache 01/26/2016  . Left arm numbness 01/26/2016  . Urethral caruncle 12/23/2015  . Vaginal pessary in situ 09/22/2015  . Health care maintenance 07/22/2015  . Baden-Walker grade 4 cystocele 06/26/2015  . First degree uterine prolapse 06/26/2015  . Hyperkalemia 04/27/2015  . Arrhythmia 04/27/2015  . SOB (shortness of breath) 04/27/2015  . Double vision 07/18/2014  . Decreased hearing 07/18/2014  . Stress 07/18/2014  . Fatigue 07/18/2014  . Environmental allergies 07/09/2013  . Hypertension 12/09/2012  . Hypercholesterolemia 12/09/2012    Luretha Murphy. Ilsa Iha, PT, DPT 03/01/21, 7:25 PM  [facility] PHYSICAL AND SPORTS MEDICINE [address] [place], [address] Phone: [phone]   Fax:  [phone]  Name: ARLYSS WEATHERSBY MRN: [id number] Date of Birth: Jun 24, 1927

## 2021-03-06 ENCOUNTER — Encounter: Payer: Self-pay | Admitting: Physical Therapy

## 2021-03-06 ENCOUNTER — Other Ambulatory Visit: Payer: Self-pay

## 2021-03-06 ENCOUNTER — Ambulatory Visit: Payer: Medicare Other | Admitting: Physical Therapy

## 2021-03-06 VITALS — BP 162/80 | HR 68

## 2021-03-06 DIAGNOSIS — R2681 Unsteadiness on feet: Secondary | ICD-10-CM | POA: Diagnosis not present

## 2021-03-06 DIAGNOSIS — M6281 Muscle weakness (generalized): Secondary | ICD-10-CM

## 2021-03-06 DIAGNOSIS — G8929 Other chronic pain: Secondary | ICD-10-CM

## 2021-03-06 NOTE — Therapy (Signed)
[facility] PHYS[facility] SPORTS MEDICINE [address] [place], [address] Phone: [phone]   Fax:  [phone]  Physical Therapy Treatment  Patient Details  Name: Elizabeth [name] MRN: [id number] Date of Birth: Sep 25, 1927 Referring Provider (PT): Dale Mill Hall, MD   Encounter Date: 03/06/2021   PT End of Session - 03/06/21 0912    Visit Number 8    Number of Visits 16    Date for PT Re-Evaluation 03/29/21    Authorization Type [employer] Medicare reporting period from 02/01/2021    Progress Note Due on Visit 10    PT Start Time 0902    PT Stop Time 0942    PT Time Calculation (min) 40 min    Equipment Utilized During Treatment Gait belt    Activity Tolerance Patient tolerated treatment well    Behavior During Therapy [facility] for tasks assessed/performed           Past Medical History:  Diagnosis Date  . Cystocele or rectocele with incomplete uterine prolapse   . Hyperlipidemia   . Hypertension   . Irritable bowel syndrome   . Palpitations   . Vaginal atrophy     Past Surgical History:  Procedure Laterality Date  . CATARACT EXTRACTION  2006    Vitals:   03/06/21 0909  BP: (!) 162/80  Pulse: 68  SpO2: 99%     Subjective Assessment - 03/06/21 0904    Subjective Patient reports she is feeling well. Has been painting a lot this weekend and did not do much exercise. She states she felt well following last treatment session. No pain upon arrival. States she feels a bit short of breath today.    Pertinent History Patient is a 87 y.o. female who presents to outpatient physical therapy with a referral for medical diagnosis weakness. This patient's chief complaints consist of feeling like she is losing strength and loses her balance sometimes leading to the following functional deficits: difficulty maintaining her previous activity level and concern about being imbalanced, heavy household chores, etc.   Relevant past medical history and  comorbidities include hypertension, rhythm irregularity, heart valve problem (mointors by her doctor), R shoulder pain, allergic to house dust, type 2 diabetes, CKD, decreased hearing, R shoulder pain, dizziness, cystocele and uterine prolapse, cataract surgery, heartburn. Patient denies hx of cancer, stroke, seizures, lung problem, major cardiac events, diabetes, unexplained weight loss, changes in bowel or bladder problems, new onset stumbling or dropping things apart from described below.    Limitations Lifting;Standing;Walking;House hold activities   difficulty maintaining previous level of activities   Patient Stated Goals to get stronger and have better balance    Currently in Pain? No/denies           TREATMENT:  Therapeutic exercise:to centralize symptoms and improve ROM, strength, muscular endurance, and activity tolerance required for successful completion of functional activities. - vitals check to assess safety of exercise(WFL see above) -NuStep level4using bilateral upper and lower extremities. Seat/handle setting4/9. For improved extremity mobility, muscular endurance, and activity tolerance; and to induce the analgesic effect of aerobic exercise, stimulate improved joint nutrition, and prepare body structures and systems for following interventions. x6Minutes. Average SPM =56 - RDL c freeweight3x6 c20#KB  Neuromuscular Re-education:to improve, balance, postural strength, muscle activation patterns, and stabilization strength required for functional activities (SBA - CGA for safety).  - alternating double taps on cone with no UE support, double tap on 4 cones of varying heights positioned in a square formation  with a sideways hop between each 2 rounds each direction. X2.5 times through.  - sideways hopping over 6 inch hurdles, 2.5x6 each direction (3 at a time).  - standing 10#KB pass arounds, 2x10 each way, 1x5 each way.   Pt required multimodal cuing for proper  technique and to facilitate improved neuromuscular control, strength, range of motion, and functional ability resulting in improved performance and form.  HOME EXERCISE PROGRAM Access Code: [email or web address] URL: [email or web address] Date: 02/15/2021 Prepared by: Olga Coaster  Exercises Tandem Stance with Head Rotationwith single UE support- 2 sets -10 reps Straight Leg Raise(pt reported performing bilateral leg raise at home, verbalized understanding) Bridge(pt verbalized understanding) Standing March with Counter Support2sets - 10 reps Standing Hip Abduction with Counter Support2sets - 10 reps Standing Hip Extension with Counter Support2sets - 10 reps Heel rises with counter support -2sets - 10 reps    PT Education - 03/06/21 0912    Education Details exercise form/technique    Person(s) Educated Patient    Methods Explanation;Demonstration;Tactile cues;Verbal cues    Comprehension Verbalized understanding;Returned demonstration;Verbal cues required;Tactile cues required;Need further instruction            PT Short Term Goals - 02/21/21 1608      PT SHORT TERM GOAL #1   Title Be independent with initial home exercise program for self-management of symptoms.    Baseline Initial HEP to be provided visit 2 as appropriate (02/01/2021);    Time 3    Period Weeks    Status Achieved    Target Date 02/22/21             PT Long Term Goals - 02/01/21 1946      PT LONG TERM GOAL #1   Title Be independent with a long-term home exercise program for self-management of symptoms.    Baseline Initial HEP to be provided visit 2 as appropriate (02/01/2021);    Time 12    Period Weeks    Status New   TARGET DATE FOR ALL LONG TERM GOALS: 04/26/2021     PT LONG TERM GOAL #2   Title Demonstrate improved FOTO score by 10 units to demonstrate improvement in overall condition and self-reported functional ability.    Baseline to be tested vist 2 as appropriate  (02/01/2021);    Time 12    Period Weeks    Status New      PT LONG TERM GOAL #3   Title Patient will demonstrate improved FGA to equal or more than 25/30 to demonstrate improved fall risk to low to improve patient's ability to complete housework and mobility safetly.    Baseline 22/30 (02/01/2021);    Time 12    Period Weeks    Status New      PT LONG TERM GOAL #4   Title Patient will demonstrate floor to waist lift of equal or greater than 20# x 10 reps with good form to improve her ability to complete heavy housework and lift groceries at home.    Baseline to be tested at visit 2 as apporpriate (02/01/2021):    Time 12    Period Weeks    Status New      PT LONG TERM GOAL #5   Title Complete community, work and/or recreational activities without limitation due to current condition.    Baseline limiting ability to complete her usual level of activity, ambulate safely, manage her home alone, and complete heavy household chores without difficulty (02/01/2021);    Time 12  Period Weeks    Status New                 Plan - 03/06/21 1744    Clinical Impression Statement Patient tolerated treatment well given breif rest between activities and sets to catch her breath. Most exercises completed in a circuit fashion to allow patient to complete sets of more physiologically challenging or more cognitively/coordination/balance challenging intermingle with each other to all her to manage her breath more easily. Continues to report feeling shortness of breath but does not demonstrate heavy breathing. Appears to be progressing in confidence in activity. Introduced more power/agility activities today to improve fall risk and ability to get around safely. Patient would benefit from continued management of limiting condition by skilled physical therapist to address remaining impairments and functional limitations to work towards stated goals and return to PLOF or maximal functional independence.     Personal Factors and Comorbidities Age;Comorbidity 3+    Comorbidities hypertension, rhythm irregularity, heart valve problem (mointors by her doctor), R shoulder pain, allergic to house dust, type 2 diabetes, CKD, decreased hearing, R shoulder pain, dizziness, cystocele and uterine prolapse, cataract surgery, heartburn.    Examination-Activity Limitations Locomotion Level;Lift;Reach Overhead;Carry;Stairs;Other    Examination-Participation Restrictions Cleaning;Yard Work;Community Activity    Stability/Clinical Decision Making Stable/Uncomplicated    Rehab Potential Good    PT Frequency 2x / week    PT Duration 12 weeks    PT Treatment/Interventions ADLs/Self Care Home Management;Cryotherapy;Moist Heat;Electrical Stimulation;DME Instruction;Functional mobility training;Therapeutic activities;Therapeutic exercise;Balance training;Neuromuscular re-education;Patient/family education;Gait training;Manual techniques;Dry needling;Passive range of motion;Spinal Manipulations;Visual/perceptual remediation/compensation;Joint Manipulations    PT Next Visit Plan balance, strength training    PT Home Exercise Plan Medbridge Access Code: [email or web address]    Consulted and Agree with Plan of Care Patient           Patient will benefit from skilled therapeutic intervention in order to improve the following deficits and impairments:  Dizziness,Pain,Decreased coordination,Decreased mobility,Impaired UE functional use,Impaired perceived functional ability,Decreased strength,Decreased endurance,Decreased activity tolerance,Decreased balance  Visit Diagnosis: Unsteadiness on feet  Muscle weakness (generalized)  Chronic right shoulder pain     Problem List Patient Active Problem List   Diagnosis Date Noted  . Right shoulder pain 10/02/2020  . CKD (chronic kidney disease) stage 3, GFR 30-59 ml/min (HCC) 10/02/2020  . Elevated TSH 10/02/2020  . Weakness 05/29/2020  . Type 2 diabetes mellitus with hyperglycemia  (HCC) 01/07/2020  . GERD (gastroesophageal reflux disease) 11/09/2018  . Dizziness 06/02/2017  . Vaginal atrophy 09/27/2016  . SVT (supraventricular tachycardia) (HCC) 01/27/2016  . Headache 01/26/2016  . Left arm numbness 01/26/2016  . Urethral caruncle 12/23/2015  . Vaginal pessary in situ 09/22/2015  . Health care maintenance 07/22/2015  . Baden-Walker grade 4 cystocele 06/26/2015  . First degree uterine prolapse 06/26/2015  . Hyperkalemia 04/27/2015  . Arrhythmia 04/27/2015  . SOB (shortness of breath) 04/27/2015  . Double vision 07/18/2014  . Decreased hearing 07/18/2014  . Stress 07/18/2014  . Fatigue 07/18/2014  . Environmental allergies 07/09/2013  . Hypertension 12/09/2012  . Hypercholesterolemia 12/09/2012    Luretha Murphy. Ilsa Iha, PT, DPT 03/06/21, 5:45 PM  [place] [facility] PHYSICAL AND SPORTS MEDICINE [address], [place], [address] Phone: [phone]   Fax:  [phone]  Name: Elizabeth [name] MRN: [id number] Date of Birth: 1927/02/12

## 2021-03-08 ENCOUNTER — Other Ambulatory Visit: Payer: Self-pay

## 2021-03-08 ENCOUNTER — Ambulatory Visit: Payer: Medicare Other | Admitting: Physical Therapy

## 2021-03-08 VITALS — BP 150/76 | HR 64

## 2021-03-08 DIAGNOSIS — M6281 Muscle weakness (generalized): Secondary | ICD-10-CM

## 2021-03-08 DIAGNOSIS — R2681 Unsteadiness on feet: Secondary | ICD-10-CM | POA: Diagnosis not present

## 2021-03-08 DIAGNOSIS — G8929 Other chronic pain: Secondary | ICD-10-CM

## 2021-03-08 NOTE — Therapy (Signed)
[place] [facility] PH[facility]TS MEDICINE [address] [place], [address] Phone: [phone]   Fax:  [phone]  Physical Therapy Treatment  Patient Details  Name: Elizabeth [name] MRN: [id number] Date of Birth: 23-Mar-1927 Referring Provider (PT): Dale Othello, MD   Encounter Date: 03/08/2021   PT End of Session - 03/08/21 1445    Visit Number 9    Number of Visits 16    Date for PT Re-Evaluation 03/29/21    Authorization Type [employer] Medicare reporting period from 02/01/2021    Progress Note Due on Visit 10    PT Start Time 1435    PT Stop Time 1515    PT Time Calculation (min) 40 min    Equipment Utilized During Treatment Gait belt    Activity Tolerance Patient tolerated treatment well    Behavior During Therapy [facility] for tasks assessed/performed           Past Medical History:  Diagnosis Date  . Cystocele or rectocele with incomplete uterine prolapse   . Hyperlipidemia   . Hypertension   . Irritable bowel syndrome   . Palpitations   . Vaginal atrophy     Past Surgical History:  Procedure Laterality Date  . CATARACT EXTRACTION  2006    Vitals:   03/08/21 1441  BP: (!) 150/76  Pulse: 64  SpO2: 98%     Subjective Assessment - 03/08/21 1435    Subjective Patient reports she feels okay today. Thinks her shortness of breath may be a bit better. Has not been sore or had any falls. No pain upon arrival.    Pertinent History Patient is a 87 y.o. female who presents to outpatient physical therapy with a referral for medical diagnosis weakness. This patient's chief complaints consist of feeling like she is losing strength and loses her balance sometimes leading to the following functional deficits: difficulty maintaining her previous activity level and concern about being imbalanced, heavy household chores, etc.   Relevant past medical history and comorbidities include hypertension, rhythm irregularity, heart valve problem (mointors  by her doctor), R shoulder pain, allergic to house dust, type 2 diabetes, CKD, decreased hearing, R shoulder pain, dizziness, cystocele and uterine prolapse, cataract surgery, heartburn. Patient denies hx of cancer, stroke, seizures, lung problem, major cardiac events, diabetes, unexplained weight loss, changes in bowel or bladder problems, new onset stumbling or dropping things apart from described below.    Limitations Lifting;Standing;Walking;House hold activities   difficulty maintaining previous level of activities   Patient Stated Goals to get stronger and have better balance    Currently in Pain? No/denies           TREATMENT:  Therapeutic exercise:to centralize symptoms and improve ROM, strength, muscular endurance, and activity tolerance required for successful completion of functional activities. - vitals check to assess safety of exercise(WFL see above) -NuStep level4using bilateral upper and lower extremities. Seat/handle setting4/9. For improved extremity mobility, muscular endurance, and activity tolerance; and to induce the analgesic effect of aerobic exercise, stimulate improved joint nutrition, and prepare body structures and systems for following interventions. x6Minutes. Average SPM =51. - RDL c freeweight3x6/7/8c20#KB - jogging x 20 feet - side stepping quickly transitioning to small side gallop (difficulty with coordination).   Neuromuscular Re-education:to improve, balance, postural strength, muscle activation patterns, and stabilization strength required for functional activities (SBA - CGA for safety).  - alternating double taps on cone withnoUE support, double tap on 4 cones of varying heights positioned in a  square formation with a sideways hop between each 2 rounds each direction. X2 times through.  - sideways hopping over 6 inch hurdles, 1x3 each direction, 3x6 each direction continuously.  - standing alphabet with B UE holding 2kg med ball, 26  letters, 1x5 each way.   Pt required multimodal cuing for proper technique and to facilitate improved neuromuscular control, strength, range of motion, and functional ability resulting in improved performance and form. CGA - SBA all balance activities.   HOME EXERCISE PROGRAM Access Code: [email or web address] URL: [email or web address] Date: 02/15/2021 Prepared by: Olga Coaster  Exercises Tandem Stance with Head Rotationwith single UE support- 2 sets -10 reps Straight Leg Raise(pt reported performing bilateral leg raise at home, verbalized understanding) Bridge(pt verbalized understanding) Standing March with Counter Support2sets - 10 reps Standing Hip Abduction with Counter Support2sets - 10 reps Standing Hip Extension with Counter Support2sets - 10 reps Heel rises with counter support -2sets - 10 reps    PT Education - 03/08/21 1445    Education Details exercise form/technique    Person(s) Educated Patient    Methods Explanation;Demonstration;Tactile cues;Verbal cues    Comprehension Verbalized understanding;Returned demonstration;Verbal cues required;Tactile cues required;Need further instruction            PT Short Term Goals - 02/21/21 1608      PT SHORT TERM GOAL #1   Title Be independent with initial home exercise program for self-management of symptoms.    Baseline Initial HEP to be provided visit 2 as appropriate (02/01/2021);    Time 3    Period Weeks    Status Achieved    Target Date 02/22/21             PT Long Term Goals - 02/01/21 1946      PT LONG TERM GOAL #1   Title Be independent with a long-term home exercise program for self-management of symptoms.    Baseline Initial HEP to be provided visit 2 as appropriate (02/01/2021);    Time 12    Period Weeks    Status New   TARGET DATE FOR ALL LONG TERM GOALS: 04/26/2021     PT LONG TERM GOAL #2   Title Demonstrate improved FOTO score by 10 units to demonstrate improvement in overall  condition and self-reported functional ability.    Baseline to be tested vist 2 as appropriate (02/01/2021);    Time 12    Period Weeks    Status New      PT LONG TERM GOAL #3   Title Patient will demonstrate improved FGA to equal or more than 25/30 to demonstrate improved fall risk to low to improve patient's ability to complete housework and mobility safetly.    Baseline 22/30 (02/01/2021);    Time 12    Period Weeks    Status New      PT LONG TERM GOAL #4   Title Patient will demonstrate floor to waist lift of equal or greater than 20# x 10 reps with good form to improve her ability to complete heavy housework and lift groceries at home.    Baseline to be tested at visit 2 as apporpriate (02/01/2021):    Time 12    Period Weeks    Status New      PT LONG TERM GOAL #5   Title Complete community, work and/or recreational activities without limitation due to current condition.    Baseline limiting ability to complete her usual level of activity, ambulate safely, manage her home alone, and complete  heavy household chores without difficulty (02/01/2021);    Time 12    Period Weeks    Status New                 Plan - 03/08/21 1639    Clinical Impression Statement Patient tolerated treatment well with improved confidence and recovery following each activity. Continues to report some shortness of breath. Reports she continues to walk on TM 20 min each evening at ~ 2.5 mph and do morning exercises. Plan to complete formal progress note next session. Patient would benefit from continued management of limiting condition by skilled physical therapist to address remaining impairments and functional limitations to work towards stated goals and return to PLOF or maximal functional independence.    Personal Factors and Comorbidities Age;Comorbidity 3+    Comorbidities hypertension, rhythm irregularity, heart valve problem (mointors by her doctor), R shoulder pain, allergic to house dust, type 2  diabetes, CKD, decreased hearing, R shoulder pain, dizziness, cystocele and uterine prolapse, cataract surgery, heartburn.    Examination-Activity Limitations Locomotion Level;Lift;Reach Overhead;Carry;Stairs;Other    Examination-Participation Restrictions Cleaning;Yard Work;Community Activity    Stability/Clinical Decision Making Stable/Uncomplicated    Rehab Potential Good    PT Frequency 2x / week    PT Duration 12 weeks    PT Treatment/Interventions ADLs/Self Care Home Management;Cryotherapy;Moist Heat;Electrical Stimulation;DME Instruction;Functional mobility training;Therapeutic activities;Therapeutic exercise;Balance training;Neuromuscular re-education;Patient/family education;Gait training;Manual techniques;Dry needling;Passive range of motion;Spinal Manipulations;Visual/perceptual remediation/compensation;Joint Manipulations    PT Next Visit Plan balance, strength training    PT Home Exercise Plan Medbridge Access Code: [email or web address]    Consulted and Agree with Plan of Care Patient           Patient will benefit from skilled therapeutic intervention in order to improve the following deficits and impairments:  Dizziness,Pain,Decreased coordination,Decreased mobility,Impaired UE functional use,Impaired perceived functional ability,Decreased strength,Decreased endurance,Decreased activity tolerance,Decreased balance  Visit Diagnosis: Unsteadiness on feet  Muscle weakness (generalized)  Chronic right shoulder pain     Problem List Patient Active Problem List   Diagnosis Date Noted  . Right shoulder pain 10/02/2020  . CKD (chronic kidney disease) stage 3, GFR 30-59 ml/min (HCC) 10/02/2020  . Elevated TSH 10/02/2020  . Weakness 05/29/2020  . Type 2 diabetes mellitus with hyperglycemia (HCC) 01/07/2020  . GERD (gastroesophageal reflux disease) 11/09/2018  . Dizziness 06/02/2017  . Vaginal atrophy 09/27/2016  . SVT (supraventricular tachycardia) (HCC) 01/27/2016  . Headache  01/26/2016  . Left arm numbness 01/26/2016  . Urethral caruncle 12/23/2015  . Vaginal pessary in situ 09/22/2015  . Health care maintenance 07/22/2015  . Baden-Walker grade 4 cystocele 06/26/2015  . First degree uterine prolapse 06/26/2015  . Hyperkalemia 04/27/2015  . Arrhythmia 04/27/2015  . SOB (shortness of breath) 04/27/2015  . Double vision 07/18/2014  . Decreased hearing 07/18/2014  . Stress 07/18/2014  . Fatigue 07/18/2014  . Environmental allergies 07/09/2013  . Hypertension 12/09/2012  . Hypercholesterolemia 12/09/2012    Elizabeth [name]. Ilsa Iha, PT, DPT 03/08/21, 4:40 PM  [place] [facility] PHYSICAL AND SPORTS MEDICINE [address] [place], [address] Phone: [phone]   Fax:  [phone]  Name: Elizabeth [name] MRN: [id number] Date of Birth: Sep 25, 1927

## 2021-03-15 ENCOUNTER — Encounter: Payer: Self-pay | Admitting: Physical Therapy

## 2021-03-15 ENCOUNTER — Ambulatory Visit: Payer: Medicare Other | Admitting: Physical Therapy

## 2021-03-15 ENCOUNTER — Other Ambulatory Visit: Payer: Self-pay

## 2021-03-15 VITALS — BP 162/72 | HR 68

## 2021-03-15 DIAGNOSIS — G8929 Other chronic pain: Secondary | ICD-10-CM

## 2021-03-15 DIAGNOSIS — R2681 Unsteadiness on feet: Secondary | ICD-10-CM

## 2021-03-15 DIAGNOSIS — M6281 Muscle weakness (generalized): Secondary | ICD-10-CM

## 2021-03-15 DIAGNOSIS — M25511 Pain in right shoulder: Secondary | ICD-10-CM

## 2021-03-15 NOTE — Progress Notes (Signed)
Saw cardiology regarding sob with PT.  Hctz added.  They are following.

## 2021-03-15 NOTE — Therapy (Signed)
[facility] [address] [place], [address] Phone: [phone]   Fax:  [phone]  Physical Therapy Treatment / Progress Note Dates of reporting: 02/01/2021 - 03/15/2021  Patient Details  Name: Elizabeth [name] MRN: [id number] Date of Birth: 29-Apr-1927 Referring Provider (PT): Einar Pheasant, MD   Encounter Date: 03/15/2021   PT End of Session - 03/15/21 1852    Visit Number 10    Number of Visits 16    Date for PT Re-Evaluation 03/29/21    Authorization Type [employer] Medicare reporting period from 02/01/2021    Progress Note Due on Visit 10    PT Start Time 1650    PT Stop Time 1730    PT Time Calculation (min) 40 min    Equipment Utilized During Treatment Gait belt    Activity Tolerance Patient tolerated treatment well    Behavior During Therapy [facility] for tasks assessed/performed           Past Medical History:  Diagnosis Date  . Cystocele or rectocele with incomplete uterine prolapse   . Hyperlipidemia   . Hypertension   . Irritable bowel syndrome   . Palpitations   . Vaginal atrophy     Past Surgical History:  Procedure Laterality Date  . CATARACT EXTRACTION  2006    Vitals:   03/15/21 1709  BP: (!) 162/72  Pulse: 68  SpO2: 98%     Subjective Assessment - 03/15/21 1709    Subjective Patient reports she has felt more off balance today and thinks it is related to changes in the barometric pressure with the current weather system. States her BP has been all over the place with morning reading close to 100/65 mmHg and higher later in the day. States she feels she has been benefitting from PT but is still very concerned about her imbalance and falling. Would like to know why she is dizzy so often. Previously has expressed she would not like to engage in any testing for BPPV. States she feels her confidence in not falling varies drastically over each day as does her perception of her functional ability.  Completed FOTO at home yesterday but feels unsure if she answered the questions appropriately. State she has difficulty with recreation because she cannot golf or play tennis (related to dizziness,coordination), gets winded with stairs, and has difficulty with vigorous activities such as lifting heavy chairs at home. Patient reports she has not been so aware of feeling short of breath lately. Continues to do her daily exercise.    Pertinent History Patient is a 87 y.o. female who presents to outpatient physical therapy with a referral for medical diagnosis weakness. This patient's chief complaints consist of feeling like she is losing strength and loses her balance sometimes leading to the following functional deficits: difficulty maintaining her previous activity level and concern about being imbalanced, heavy household chores, etc.   Relevant past medical history and comorbidities include hypertension, rhythm irregularity, heart valve problem (mointors by her doctor), R shoulder pain, allergic to house dust, type 2 diabetes, CKD, decreased hearing, R shoulder pain, dizziness, cystocele and uterine prolapse, cataract surgery, heartburn. Patient denies hx of cancer, stroke, seizures, lung problem, major cardiac events, diabetes, unexplained weight loss, changes in bowel or bladder problems, new onset stumbling or dropping things apart from described below.    Limitations Lifting;Standing;Walking;House hold activities   difficulty maintaining previous level of activities   Patient Stated Goals to get stronger and have  better balance    Currently in Pain? No/denies             [facility] PT Assessment - 03/15/21 1705      Assessment   Medical Diagnosis weakness    Referring Provider (PT) Einar Pheasant, MD    Onset Date/Surgical Date --   over the last 3 years   Hand Dominance Right    Prior Therapy none for this problem prior to current episode of care      Precautions   Precautions [place] residence    Living Arrangements Alone    Type of Home Apartment    Home Access Level entry    Home Layout One level      Prior Function   Level of Independence Independent      Cognition   Overall Cognitive Status Within Functional Limits for tasks assessed      Functional Gait  Assessment   Gait assessed  Yes    Gait Level Surface Walks 20 ft in less than 5.5 sec, no assistive devices, good speed, no evidence for imbalance, normal gait pattern, deviates no more than 6 in outside of the 12 in walkway width.   4.42 seconds   Change in Gait Speed Able to smoothly change walking speed without loss of balance or gait deviation. Deviate no more than 6 in outside of the 12 in walkway width.    Gait with Horizontal Head Turns Performs head turns smoothly with slight change in gait velocity (eg, minor disruption to smooth gait path), deviates 6-10 in outside 12 in walkway width, or uses an assistive device.    Gait with Vertical Head Turns Performs head turns with no change in gait. Deviates no more than 6 in outside 12 in walkway width.    Gait and Pivot Turn Pivot turns safely within 3 sec and stops quickly with no loss of balance.    Step Over Obstacle Is able to step over 2 stacked shoe boxes taped together (9 in total height) without changing gait speed. No evidence of imbalance.    Gait with Narrow Base of Support Ambulates 7-9 steps.    Gait with Eyes Closed Walks 20 ft, slow speed, abnormal gait pattern, evidence for imbalance, deviates 10-15 in outside 12 in walkway width. Requires more than 9 sec to ambulate 20 ft.    Ambulating Backwards Walks 20 ft, uses assistive device, slower speed, mild gait deviations, deviates 6-10 in outside 12 in walkway width.    Steps Alternating feet, no rail.   19-24 = medium risk fall   Total Score 25    FGA comment: Interpretation: 25-28 = low risk fall           OBJECTIVE FOTO = 80 ABC = 38.3%    FUNCTIONAL/BALANCE TESTING FGA: 25/30 (see above) Floor to waist lift 20# x10: able to perform (breathing better).   TREATMENT:   Neuromuscular Re-education: to improve, balance, postural strength, muscle activation patterns, and stabilization strength required for functional activities: - Functional Gait Assessment (see above) to assess progress.   Therapeutic exercise: to centralize symptoms and improve ROM, strength, muscular endurance, and activity tolerance required for successful completion of functional activities.  - vitals check to assess safety of exercise [facility], see above) - floor to waist lift 1x10 with 20# KB to assess progress and improve strength.   Pt required multimodal cuing for proper technique and to facilitate improved  neuromuscular control, strength, range of motion, and functional ability resulting in improved performance and form. CGA - SBA all balance activities.   Extra time taken to complete FOTO questionnaire (with supervision to help patient with many questions she had about the questionnaire).    HOME EXERCISE PROGRAM Access Code: [email or web address] URL: [email or web address] Date: 02/15/2021 Prepared by: Lieutenant Diego  Exercises Tandem Stance with Head Rotationwith single UE support- 2 sets -10 reps Straight Leg Raise(pt reported performing bilateral leg raise at home, verbalized understanding) Bridge(pt verbalized understanding) Standing March with Counter Support2sets - 10 reps Standing Hip Abduction with Counter Support2sets - 10 reps Standing Hip Extension with Counter Support2sets - 10 reps Heel rises with counter support -2sets - 10 reps     PT Education - 03/15/21 1852    Education Details exercise form/technique. POC    Person(s) Educated Patient    Methods Explanation;Demonstration;Tactile cues;Verbal cues    Comprehension Returned demonstration;Verbalized understanding;Verbal cues required;Tactile cues required;Need  further instruction            PT Short Term Goals - 02/21/21 1608      PT SHORT TERM GOAL #1   Title Be independent with initial home exercise program for self-management of symptoms.    Baseline Initial HEP to be provided visit 2 as appropriate (02/01/2021);    Time 3    Period Weeks    Status Achieved    Target Date 02/22/21             PT Long Term Goals - 03/15/21 2015      PT LONG TERM GOAL #1   Title Be independent with a long-term home exercise program for self-management of symptoms.    Baseline Initial HEP to be provided visit 2 as appropriate (02/01/2021); currently participating in appropriate HEP (03/15/2021);    Time 12    Period Weeks    Status Partially Met   TARGET DATE FOR ALL LONG TERM GOALS: 04/26/2021     PT LONG TERM GOAL #2   Title Demonstrate improved FOTO score by 10 units to demonstrate improvement in overall condition and self-reported functional ability.    Baseline to be tested vist 2 as appropriate (02/01/2021); 85 (02/06/2021); 75 (03/14/2021); 80 (03/15/21);    Time 12    Period Weeks    Status On-going      PT LONG TERM GOAL #3   Title Patient will demonstrate improved FGA to equal or more than 25/30 to demonstrate improved fall risk to low to improve patient's ability to complete housework and mobility safetly.    Baseline 22/30 (02/01/2021); 25/30 (03/15/2021);    Time 12    Period Weeks    Status Achieved      PT LONG TERM GOAL #4   Title Patient will demonstrate floor to waist lift of equal or greater than 20# x 10 reps with good form to improve her ability to complete heavy housework and lift groceries at home.    Baseline to be tested at visit 2 as apporpriate (02/01/2021): completed with some shortness of breath and difficulty with last rep (03/15/2021);    Time 12    Period Weeks    Status Achieved      PT LONG TERM GOAL #5   Title Complete community, work and/or recreational activities without limitation due to current condition.     Baseline limiting ability to complete her usual level of activity, ambulate safely, manage her home alone, and complete heavy household chores without difficulty (02/01/2021); continues  to have limitations in usual activity level, feels at risk of falling when ambulation, has difficulty with heavy household chores (03/15/2021);    Time 12    Period Weeks    Status Partially Met                 Plan - 03/15/21 2014    Clinical Impression Statement Patient has attended 10 physical therapy sessions and has made progress towards some goals. She demonstrates improvements in Functional Gait Assessment and has improved from moderate fall risk to low fall risk classification, although she continues to report unsteadiness related to chronic vertigo and being very careful to keep from falling. She demonstrates improved lifting ability but does get short of breath with lifting task. She does not show improvement on FOTO score, although she attributes some of this to feeling that her function is very variable day to day and she also had an episode of breathlessness during one of her PT sessions that decreased her confidence in her abilities some. Since then she has been closely monitored for cardiovascular safety during her PT sessions with improving confidence over time. She does report continued limitations in her functional activities including limitations achieving her usual level of activity (able to paint for 1.5 hours instead of 4 hours at a time, for example), ambulate safely, manage her home alone, and complete heavy household chores without difficulty. Patient would benefit from long term exercise program and social interactions but is safer in a supervised environment to complete more demanding strengthen exercises and balance exercises due to fall risk and episode of dyspnea. Patient would benefit from continued management of limiting condition by skilled physical therapist to address remaining  impairments and functional limitations to work towards stated goals and return to PLOF or maximal functional independence.    Personal Factors and Comorbidities Age;Comorbidity 3+    Comorbidities hypertension, rhythm irregularity, heart valve problem (mointors by her doctor), R shoulder pain, allergic to house dust, type 2 diabetes, CKD, decreased hearing, R shoulder pain, dizziness, cystocele and uterine prolapse, cataract surgery, heartburn.    Examination-Activity Limitations Locomotion Level;Lift;Reach Overhead;Carry;Stairs;Other    Examination-Participation Restrictions Cleaning;Yard Work;Community Activity    Stability/Clinical Decision Making Stable/Uncomplicated    Rehab Potential Good    PT Frequency 2x / week    PT Duration 12 weeks    PT Treatment/Interventions ADLs/Self Care Home Management;Cryotherapy;Moist Heat;Electrical Stimulation;DME Instruction;Functional mobility training;Therapeutic activities;Therapeutic exercise;Balance training;Neuromuscular re-education;Patient/family education;Gait training;Manual techniques;Dry needling;Passive range of motion;Spinal Manipulations;Visual/perceptual remediation/compensation;Joint Manipulations    PT Next Visit Plan balance, strength training, transition to independent HEP or community exercise setting as appropriate    PT Home Exercise Plan Medbridge Access Code: [email or web address]    Consulted and Agree with Plan of Care Patient           Patient will benefit from skilled therapeutic intervention in order to improve the following deficits and impairments:  Dizziness,Pain,Decreased coordination,Decreased mobility,Impaired UE functional use,Impaired perceived functional ability,Decreased strength,Decreased endurance,Decreased activity tolerance,Decreased balance  Visit Diagnosis: Unsteadiness on feet  Muscle weakness (generalized)  Chronic right shoulder pain     Problem List Patient Active Problem List   Diagnosis Date Noted  . Right  shoulder pain 10/02/2020  . CKD (chronic kidney disease) stage 3, GFR 30-59 ml/min (HCC) 10/02/2020  . Elevated TSH 10/02/2020  . Weakness 05/29/2020  . Type 2 diabetes mellitus with hyperglycemia ([place]) 01/07/2020  . GERD (gastroesophageal reflux disease) 11/09/2018  . Dizziness 06/02/2017  . Vaginal atrophy 09/27/2016  . SVT (supraventricular tachycardia) (  [place]) 01/27/2016  . Headache 01/26/2016  . Left arm numbness 01/26/2016  . Urethral caruncle 12/23/2015  . Vaginal pessary in situ 09/22/2015  . Health care maintenance 07/22/2015  . Baden-Walker grade 4 cystocele 06/26/2015  . First degree uterine prolapse 06/26/2015  . Hyperkalemia 04/27/2015  . Arrhythmia 04/27/2015  . SOB (shortness of breath) 04/27/2015  . Double vision 07/18/2014  . Decreased hearing 07/18/2014  . Stress 07/18/2014  . Fatigue 07/18/2014  . Environmental allergies 07/09/2013  . Hypertension 12/09/2012  . Hypercholesterolemia 12/09/2012   Everlean Alstrom. Graylon Good, PT, DPT 03/15/21, 8:19 PM  [facility] [address] [place], [address] Phone: [phone]   Fax:  [phone]  Name: HATTIE PINE MRN: [id number] Date of Birth: 1927-09-18

## 2021-03-20 ENCOUNTER — Ambulatory Visit: Payer: Medicare Other | Admitting: Physical Therapy

## 2021-03-20 ENCOUNTER — Other Ambulatory Visit: Payer: Self-pay

## 2021-03-20 VITALS — BP 160/78 | HR 70

## 2021-03-20 DIAGNOSIS — G8929 Other chronic pain: Secondary | ICD-10-CM

## 2021-03-20 DIAGNOSIS — R2681 Unsteadiness on feet: Secondary | ICD-10-CM | POA: Diagnosis not present

## 2021-03-20 DIAGNOSIS — M25511 Pain in right shoulder: Secondary | ICD-10-CM

## 2021-03-20 DIAGNOSIS — M6281 Muscle weakness (generalized): Secondary | ICD-10-CM

## 2021-03-20 NOTE — Therapy (Signed)
[facility] [address] [place], [address] Phone: [phone]   Fax:  [phone]  Physical Therapy Treatment  Patient Details  Name: Elizabeth [name] MRN: [id number] Date of Birth: 1927/11/26 Referring Provider (PT): Einar Pheasant, MD   Encounter Date: 03/20/2021   PT End of Session - 03/20/21 1435    Visit Number 11    Number of Visits 16    Date for PT Re-Evaluation 03/29/21    Authorization Type [employer] Medicare reporting period from 02/15/2021    Progress Note Due on Visit 10    PT Start Time 1430    PT Stop Time 1510    PT Time Calculation (min) 40 min    Equipment Utilized During Treatment Gait belt    Activity Tolerance Patient tolerated treatment well    Behavior During Therapy [facility] for tasks assessed/performed           Past Medical History:  Diagnosis Date  . Cystocele or rectocele with incomplete uterine prolapse   . Hyperlipidemia   . Hypertension   . Irritable bowel syndrome   . Palpitations   . Vaginal atrophy     Past Surgical History:  Procedure Laterality Date  . CATARACT EXTRACTION  2006    Vitals:   03/20/21 1440  BP: (!) 160/78  Pulse: 70  SpO2: (!) 89%     Subjective Assessment - 03/20/21 1433    Subjective Patient reports no pain upon arrival and no falls since last session. States the barometric pressure changes have really been messing with her equilibrium so she has taken a lot of dramamine. She did not take any this morning and had no difficulty with vertigo or feeling of falling late yesterday or today. She reports she has lived with these episodes almost all of her life.    Pertinent History Patient is a 87 y.o. female who presents to outpatient physical therapy with a referral for medical diagnosis weakness. This patient's chief complaints consist of feeling like she is losing strength and loses her balance sometimes leading to the following functional deficits:  difficulty maintaining her previous activity level and concern about being imbalanced, heavy household chores, etc.   Relevant past medical history and comorbidities include hypertension, rhythm irregularity, heart valve problem (mointors by her doctor), R shoulder pain, allergic to house dust, type 2 diabetes, CKD, decreased hearing, R shoulder pain, dizziness, cystocele and uterine prolapse, cataract surgery, heartburn. Patient denies hx of cancer, stroke, seizures, lung problem, major cardiac events, diabetes, unexplained weight loss, changes in bowel or bladder problems, new onset stumbling or dropping things apart from described below.    Limitations Lifting;Standing;Walking;House hold activities   difficulty maintaining previous level of activities   Patient Stated Goals to get stronger and have better balance    Currently in Pain? No/denies             TREATMENT:   Therapeutic exercise:to centralize symptoms and improve ROM, strength, muscular endurance, and activity tolerance required for successful completion of functional activities.  - vitals check to assess safety of exercise (WFL, see above) -NuStep level4using bilateral upper and lower extremities. Seat/handle setting4/9. For improved extremity mobility, muscular endurance, and activity tolerance; and to induce the analgesic effect of aerobic exercise, stimulate improved joint nutrition, and prepare body structures and systems for following interventions. x5Minutes. Average SPM =65. - floor to waist lift 2x10 with 20# KB  - jogging 4x15 feet - floor <> Stand transfer x  2 (once without furniture CGA, once with chair SBA). Very nervous about it. Could not push buttocks up from the floor with hands down, apparently due to quad strength deficit. Able to perform with legs wider. Unsteady on feet getting up without furniture.   Neuromuscular Re-education:to improve, balance, postural strength, muscle activation patterns, and  stabilization strength required for functional activities (SBA - CGA for safety). - alternating double taps on cone withnoUE support,double tap on 4 cones of varying heights positioned in a square formation with a sideways hop between each 1 rounds each direction. X2 times through.  - sideways hopping over 6 inch hurdles, 2x6 each direction - figure 8 walks around 2 cones, 2x5 rounds.  - step up/step down forward leading with same leg over large dynadisc, 1x10 each side. CGA.   Pt required multimodal cuing for proper technique and to facilitate improved neuromuscular control, strength, range of motion, and functional ability resulting in improved performance and form. CGA - SBA all balance activities.    HOME EXERCISE PROGRAM Access Code: [email or web address] URL: [email or web address] Date: 02/15/2021 Prepared by: Lieutenant Diego  Exercises Tandem Stance with Head Rotationwith single UE support- 2 sets -10 reps Straight Leg Raise(pt reported performing bilateral leg raise at home, verbalized understanding) Bridge(pt verbalized understanding) Standing March with Counter Support2sets - 10 reps Standing Hip Abduction with Counter Support2sets - 10 reps Standing Hip Extension with Counter Support2sets - 10 reps Heel rises with counter support -2sets - 10 reps    PT Education - 03/20/21 1435    Education Details exercise form/technique.    Person(s) Educated Patient    Methods Explanation;Demonstration;Tactile cues;Verbal cues    Comprehension Verbalized understanding;Returned demonstration;Verbal cues required;Tactile cues required;Need further instruction            PT Short Term Goals - 02/21/21 1608      PT SHORT TERM GOAL #1   Title Be independent with initial home exercise program for self-management of symptoms.    Baseline Initial HEP to be provided visit 2 as appropriate (02/01/2021);    Time 3    Period Weeks    Status Achieved    Target Date  02/22/21             PT Long Term Goals - 03/15/21 2015      PT LONG TERM GOAL #1   Title Be independent with a long-term home exercise program for self-management of symptoms.    Baseline Initial HEP to be provided visit 2 as appropriate (02/01/2021); currently participating in appropriate HEP (03/15/2021);    Time 12    Period Weeks    Status Partially Met   TARGET DATE FOR ALL LONG TERM GOALS: 04/26/2021     PT LONG TERM GOAL #2   Title Demonstrate improved FOTO score by 10 units to demonstrate improvement in overall condition and self-reported functional ability.    Baseline to be tested vist 2 as appropriate (02/01/2021); 85 (02/06/2021); 75 (03/14/2021); 80 (03/15/21);    Time 12    Period Weeks    Status On-going      PT LONG TERM GOAL #3   Title Patient will demonstrate improved FGA to equal or more than 25/30 to demonstrate improved fall risk to low to improve patient's ability to complete housework and mobility safetly.    Baseline 22/30 (02/01/2021); 25/30 (03/15/2021);    Time 12    Period Weeks    Status Achieved      PT LONG TERM GOAL #4   Title  Patient will demonstrate floor to waist lift of equal or greater than 20# x 10 reps with good form to improve her ability to complete heavy housework and lift groceries at home.    Baseline to be tested at visit 2 as apporpriate (02/01/2021): completed with some shortness of breath and difficulty with last rep (03/15/2021);    Time 12    Period Weeks    Status Achieved      PT LONG TERM GOAL #5   Title Complete community, work and/or recreational activities without limitation due to current condition.    Baseline limiting ability to complete her usual level of activity, ambulate safely, manage her home alone, and complete heavy household chores without difficulty (02/01/2021); continues to have limitations in usual activity level, feels at risk of falling when ambulation, has difficulty with heavy household chores (03/15/2021);    Time 12     Period Weeks    Status Partially Met                 Plan - 03/20/21 1822    Clinical Impression Statement Patient tolerated treatment well overall with continued improvement with tolerance for shortness of breath. Did need extended breaks after more demanding exercises due to shortness of breath. Got very nervous with floor <> stand transfer and expressed feelings about her husband needing the EMTs multiple times as his health declined before his death because he would fall and be unable to get up. Patient did demonstrate LE weakness that made it hard for her to get up without furniture support and she would benefit from further practice. Patient would benefit from continued management of limiting condition by skilled physical therapist to address remaining impairments and functional limitations to work towards stated goals and return to PLOF or maximal functional independence.    Personal Factors and Comorbidities Age;Comorbidity 3+    Comorbidities hypertension, rhythm irregularity, heart valve problem (mointors by her doctor), R shoulder pain, allergic to house dust, type 2 diabetes, CKD, decreased hearing, R shoulder pain, dizziness, cystocele and uterine prolapse, cataract surgery, heartburn.    Examination-Activity Limitations Locomotion Level;Lift;Reach Overhead;Carry;Stairs;Other    Examination-Participation Restrictions Cleaning;Yard Work;Community Activity    Stability/Clinical Decision Making Stable/Uncomplicated    Rehab Potential Good    PT Frequency 2x / week    PT Duration 12 weeks    PT Treatment/Interventions ADLs/Self Care Home Management;Cryotherapy;Moist Heat;Electrical Stimulation;DME Instruction;Functional mobility training;Therapeutic activities;Therapeutic exercise;Balance training;Neuromuscular re-education;Patient/family education;Gait training;Manual techniques;Dry needling;Passive range of motion;Spinal Manipulations;Visual/perceptual  remediation/compensation;Joint Manipulations    PT Next Visit Plan balance, strength training, transition to independent HEP or community exercise setting as appropriate    PT Home Exercise Plan Medbridge Access Code: [email or web address]    Consulted and Agree with Plan of Care Patient           Patient will benefit from skilled therapeutic intervention in order to improve the following deficits and impairments:  Dizziness,Pain,Decreased coordination,Decreased mobility,Impaired UE functional use,Impaired perceived functional ability,Decreased strength,Decreased endurance,Decreased activity tolerance,Decreased balance  Visit Diagnosis: Unsteadiness on feet  Muscle weakness (generalized)  Chronic right shoulder pain     Problem List Patient Active Problem List   Diagnosis Date Noted  . Right shoulder pain 10/02/2020  . CKD (chronic kidney disease) stage 3, GFR 30-59 ml/min (HCC) 10/02/2020  . Elevated TSH 10/02/2020  . Weakness 05/29/2020  . Type 2 diabetes mellitus with hyperglycemia ([place]) 01/07/2020  . GERD (gastroesophageal reflux disease) 11/09/2018  . Dizziness 06/02/2017  . Vaginal atrophy 09/27/2016  . SVT (supraventricular  tachycardia) ([place]) 01/27/2016  . Headache 01/26/2016  . Left arm numbness 01/26/2016  . Urethral caruncle 12/23/2015  . Vaginal pessary in situ 09/22/2015  . Health care maintenance 07/22/2015  . Baden-Walker grade 4 cystocele 06/26/2015  . First degree uterine prolapse 06/26/2015  . Hyperkalemia 04/27/2015  . Arrhythmia 04/27/2015  . SOB (shortness of breath) 04/27/2015  . Double vision 07/18/2014  . Decreased hearing 07/18/2014  . Stress 07/18/2014  . Fatigue 07/18/2014  . Environmental allergies 07/09/2013  . Hypertension 12/09/2012  . Hypercholesterolemia 12/09/2012    Everlean Alstrom. Graylon Good, PT, DPT 03/20/21, 6:23 PM  [facility] [address], [place], [address] Phone:  [phone]   Fax:  [phone]  Name: Elizabeth [name] MRN: [id number] Date of Birth: 1927-05-11

## 2021-03-22 ENCOUNTER — Other Ambulatory Visit: Payer: Self-pay

## 2021-03-22 ENCOUNTER — Encounter: Payer: Self-pay | Admitting: Physical Therapy

## 2021-03-22 ENCOUNTER — Ambulatory Visit: Payer: Medicare Other | Admitting: Physical Therapy

## 2021-03-22 VITALS — BP 142/70 | HR 66

## 2021-03-22 DIAGNOSIS — R2681 Unsteadiness on feet: Secondary | ICD-10-CM

## 2021-03-22 DIAGNOSIS — G8929 Other chronic pain: Secondary | ICD-10-CM

## 2021-03-22 DIAGNOSIS — M6281 Muscle weakness (generalized): Secondary | ICD-10-CM

## 2021-03-22 NOTE — Therapy (Signed)
[facility] [address] [place], [place], [address] Phone: [phone]   Fax:  [phone]  Physical Therapy Treatment  Patient Details  Name: Elizabeth [name] MRN: [id number] Date of Birth: 08/23/1927 Referring Provider (PT): Scott, Charlene, MD   Encounter Date: 03/22/2021   PT End of Session - 03/22/21 1059    Visit Number 12    Number of Visits 16    Date for PT Re-Evaluation 03/29/21    Authorization Type [employer] Medicare reporting period from 02/15/2021    Progress Note Due on Visit 10    PT Start Time 1035    PT Stop Time 1113    PT Time Calculation (min) 38 min    Equipment Utilized During Treatment Gait belt    Activity Tolerance Patient tolerated treatment well    Behavior During Therapy WFL for tasks assessed/performed           Past Medical History:  Diagnosis Date  . Cystocele or rectocele with incomplete uterine prolapse   . Hyperlipidemia   . Hypertension   . Irritable bowel syndrome   . Palpitations   . Vaginal atrophy     Past Surgical History:  Procedure Laterality Date  . CATARACT EXTRACTION  2006    Vitals:   03/22/21 1041  BP: (!) 142/70  Pulse: 66  SpO2: 97%     Subjective Assessment - 03/22/21 1037    Subjective Patient reports her shortness of breath has been about the same. BP was okay this morning. States she didn't sleep but about 3 hours last night so she feels "stupid." No falls or pain since last session. 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 States this doesn't make sense to her because she likes coming to PT.    Pertinent History Patient is a 87 y.o. female who presents to outpatient physical therapy with a referral for medical diagnosis weakness. This patient's chief complaints consist of feeling like she is losing strength and loses her balance sometimes leading to the following  functional deficits: difficulty maintaining her previous activity level and concern about being imbalanced, heavy household chores, etc.   Relevant past medical history and comorbidities include hypertension, rhythm irregularity, heart valve problem (mointors by her doctor), R shoulder pain, allergic to house dust, type 2 diabetes, CKD, decreased hearing, R shoulder pain, dizziness, cystocele and uterine prolapse, cataract surgery, heartburn. Patient denies hx of cancer, stroke, seizures, lung problem, major cardiac events, diabetes, unexplained weight loss, changes in bowel or bladder problems, new onset stumbling or dropping things apart from described below.    Limitations Lifting;Standing;Walking;House hold activities   difficulty maintaining previous level of activities   Patient Stated Goals to get stronger and have better balance    Currently in Pain? No/denies            TREATMENT:  Therapeutic exercise:to centralize symptoms and improve ROM, strength, muscular endurance, and activity tolerance required for successful completion of functional activities. - vitals check to assess safety of exercise (WFL, see above) - Total gym squats at level 26, as deep as possible, 2x10 - deep squats to buttocks tap on 12 inch step with BUE support, 1x10 (very challenging).  - standing RDL with 30#DB, 3x6 reps.   Neuromuscular Re-education:to improve, balance, postural strength, muscle activation patterns, and stabilization strength required for functional activities (SBA - CGA for safety). -   step up/step down forward leading with same leg over large dynadisc, 1x10 each side. CGA.  - alternating double taps on cone withnoUE support,double tap on 4 cones of varying heights positioned in a square formation with a sideways hop between each 1 rounds each direction. X1 times through.  - sideways hopping over 6 inch hurdles,1x6each direction  Pt required multimodal cuing for proper technique and  to facilitate improved neuromuscular control, strength, range of motion, and functional ability resulting in improved performance and form.CGA - SBA all balance activities.  HOME EXERCISE PROGRAM Access Code: [email or web address] URL: [email or web address] Date: 02/15/2021 Prepared by: Lieutenant Diego  Exercises Tandem Stance with Head Rotationwith single UE support- 2 sets -10 reps Straight Leg Raise(pt reported performing bilateral leg raise at home, verbalized understanding) Bridge(pt verbalized understanding) Standing March with Counter Support2sets - 10 reps Standing Hip Abduction with Counter Support2sets - 10 reps Standing Hip Extension with Counter Support2sets - 10 reps Heel rises with counter support -2sets - 10 reps   PT Education - 03/22/21 1059    Education Details exercise form/technique.    Person(s) Educated Patient    Methods Explanation;Demonstration;Tactile cues;Verbal cues    Comprehension Verbalized understanding;Returned demonstration;Verbal cues required;Tactile cues required;Need further instruction            PT Short Term Goals - 02/21/21 1608      PT SHORT TERM GOAL #1   Title Be independent with initial home exercise program for self-management of symptoms.    Baseline Initial HEP to be provided visit 2 as appropriate (02/01/2021);    Time 3    Period Weeks    Status Achieved    Target Date 02/22/21             PT Long Term Goals - 03/15/21 2015      PT LONG TERM GOAL #1   Title Be independent with a long-term home exercise program for self-management of symptoms.    Baseline Initial HEP to be provided visit 2 as appropriate (02/01/2021); currently participating in appropriate HEP (03/15/2021);    Time 12    Period Weeks    Status Partially Met   TARGET DATE FOR ALL LONG TERM GOALS: 04/26/2021     PT LONG TERM GOAL #2   Title Demonstrate improved FOTO score by 10 units to demonstrate improvement in overall condition and  self-reported functional ability.    Baseline to be tested vist 2 as appropriate (02/01/2021); 85 (02/06/2021); 75 (03/14/2021); 80 (03/15/21);    Time 12    Period Weeks    Status On-going      PT LONG TERM GOAL #3   Title Patient will demonstrate improved FGA to equal or more than 25/30 to demonstrate improved fall risk to low to improve patient's ability to complete housework and mobility safetly.    Baseline 22/30 (02/01/2021); 25/30 (03/15/2021);    Time 12    Period Weeks    Status Achieved      PT LONG TERM GOAL #4   Title Patient will demonstrate floor to waist lift of equal or greater than 20# x 10 reps with good form to improve her ability to complete heavy housework and lift groceries at home.    Baseline to be tested at visit 2 as apporpriate (02/01/2021): completed with some shortness of breath and difficulty with last rep (03/15/2021);    Time 12    Period Weeks    Status Achieved      PT LONG TERM GOAL #5  Title Complete community, work and/or recreational activities without limitation due to current condition.    Baseline limiting ability to complete her usual level of activity, ambulate safely, manage her home alone, and complete heavy household chores without difficulty (02/01/2021); continues to have limitations in usual activity level, feels at risk of falling when ambulation, has difficulty with heavy household chores (03/15/2021);    Time 12    Period Weeks    Status Partially Met                 Plan - 03/22/21 1212    Clinical Impression Statement Patient tolerated treatment well overall with some limitation due to increased sensation of vertigo/dizziness and watery eyes associated with her allergies. Patient reports she feels confident driving home and is used to dealing with these symptoms for years. Reported increased fatigue in her legs after deep squats, appropriate for the level of difficulty. Discussed possible DOMS and appropriate response. Targeted deep squat  and quads to improve her LE strength in those ranges to help get from floor to stand.  Patient would benefit from continued management of limiting condition by skilled physical therapist to address remaining impairments and functional limitations to work towards stated goals and return to PLOF or maximal functional independence.    Personal Factors and Comorbidities Age;Comorbidity 3+    Comorbidities hypertension, rhythm irregularity, heart valve problem (mointors by her doctor), R shoulder pain, allergic to house dust, type 2 diabetes, CKD, decreased hearing, R shoulder pain, dizziness, cystocele and uterine prolapse, cataract surgery, heartburn.    Examination-Activity Limitations Locomotion Level;Lift;Reach Overhead;Carry;Stairs;Other    Examination-Participation Restrictions Cleaning;Yard Work;Community Activity    Stability/Clinical Decision Making Stable/Uncomplicated    Rehab Potential Good    PT Frequency 2x / week    PT Duration 12 weeks    PT Treatment/Interventions ADLs/Self Care Home Management;Cryotherapy;Moist Heat;Electrical Stimulation;DME Instruction;Functional mobility training;Therapeutic activities;Therapeutic exercise;Balance training;Neuromuscular re-education;Patient/family education;Gait training;Manual techniques;Dry needling;Passive range of motion;Spinal Manipulations;Visual/perceptual remediation/compensation;Joint Manipulations    PT Next Visit Plan balance, strength training, transition to independent HEP or community exercise setting as appropriate    PT Home Exercise Plan Medbridge Access Code: [email or web address]    Consulted and Agree with Plan of Care Patient           Patient will benefit from skilled therapeutic intervention in order to improve the following deficits and impairments:  Dizziness,Pain,Decreased coordination,Decreased mobility,Impaired UE functional use,Impaired perceived functional ability,Decreased strength,Decreased endurance,Decreased activity  tolerance,Decreased balance  Visit Diagnosis: Unsteadiness on feet  Muscle weakness (generalized)  Chronic right shoulder pain     Problem List Patient Active Problem List   Diagnosis Date Noted  . Right shoulder pain 10/02/2020  . CKD (chronic kidney disease) stage 3, GFR 30-59 ml/min (HCC) 10/02/2020  . Elevated TSH 10/02/2020  . Weakness 05/29/2020  . Type 2 diabetes mellitus with hyperglycemia ([place]) 01/07/2020  . GERD (gastroesophageal reflux disease) 11/09/2018  . Dizziness 06/02/2017  . Vaginal atrophy 09/27/2016  . SVT (supraventricular tachycardia) ([place]) 01/27/2016  . Headache 01/26/2016  . Left arm numbness 01/26/2016  . Urethral caruncle 12/23/2015  . Vaginal pessary in situ 09/22/2015  . Health care maintenance 07/22/2015  . Baden-Walker grade 4 cystocele 06/26/2015  . First degree uterine prolapse 06/26/2015  . Hyperkalemia 04/27/2015  . Arrhythmia 04/27/2015  . SOB (shortness of breath) 04/27/2015  . Double vision 07/18/2014  . Decreased hearing 07/18/2014  . Stress 07/18/2014  . Fatigue 07/18/2014  . Environmental allergies 07/09/2013  . Hypertension 12/09/2012  . Hypercholesterolemia 12/09/2012  Everlean Alstrom. Graylon Good, PT, DPT 03/22/21, 12:13 PM  [facility] [address], [place], [address] Phone: [phone]   Fax:  [phone]  Name: Elizabeth [name] MRN: [id number] Date of Birth: [birth year]

## 2021-03-27 ENCOUNTER — Telehealth: Payer: Self-pay

## 2021-03-27 NOTE — Telephone Encounter (Signed)
Spoke with patient. She says this is not all the time and happened to her about a year ago and resolved on their own.. She is feeling ok other wise, no other acute symptoms. She says the voices are more like religous chants that only happen in the middle of the night and sometimes her walls look like they are lit up (also only in the middle of the night) She has an appointment the beginning of June. We have an appointment open on 4/29. Just wanted to confirm with you ok to wait until then to see her. She is aware to go to acute care or ED if any change in symptoms.

## 2021-03-27 NOTE — Telephone Encounter (Signed)
Ok to schedule appt.  Please confirm no acute issues.

## 2021-03-27 NOTE — Telephone Encounter (Signed)
Pt's son Onalee Hua called and states that pt has been seeing lights on her wall at night that are not truly there. She told her grandson and son that she sees a yellow glow around pictures when she wakes up in the middle of the night. He is POA but I made him aware that he is not on the [place] so that would need to be updated for [place] to speak with him. He states that he just wants [place] to call pt to make an appt to see what is going on. He states that she knows it is not real but she does not understand why she is seeing these things. She will be expecting a call although she told him that Dr Lorin Picket told her that she would see her routinely but not for anything acute. I made him aware that that is not the case and someone would most definitely reach out to her.

## 2021-03-27 NOTE — Telephone Encounter (Signed)
[facility] Night - Cl TELEPHONE ADVICE RECORD AccessNurse Patient Name: Elizabeth [name] DOBRATZ ARD Gender: Female DOB: 16-Aug-1927 Age: 87 Y 2 M 1 D Return Phone Number: [phone] (Primary) Address: City/ State/ Zip: [place]  [address] Client [facility] Night - Cl Client Site [facility] - Night Physician Dale Elkton - MD Contact Type Call Who Is Calling Patient / Member / Family / Caregiver Call Type Triage / Clinical Caller Name Theresa Wedel Relationship To Patient Son Return Phone Number [phone] (Primary) Chief Complaint Unclassified Symptom Reason for Call Request to Schedule Office Appointment Initial Comment Needing to make a appt. Son states PT has been seeing flashes of light on wall at night and hearing certain music. Son states its not confusion. Translation No Nurse Assessment Nurse: Wyn Quaker, RN, Marylene Land Date/Time (Eastern Time): 03/27/2021 7:45:41 AM Confirm and document reason for call. If symptomatic, describe symptoms. ---Caller stated his mom lives alone. She has been seeing flashes of light and hearing music. This has been going on for about a week. She has hearing aides. Does the patient have any new or worsening symptoms? ---Yes Will a triage be completed? ---Yes Related visit to physician within the last 2 weeks? ---No Does the PT have any chronic conditions? (i.e. diabetes, asthma, this includes High risk factors for pregnancy, etc.) ---Yes List chronic conditions. ---HTN, "slew of meds" Is this a behavioral health or substance abuse call? ---Yes Are you having any thoughts or feelings of harming or killing yourself or someone else? ---No Are you currently experiencing any physical discomfort that you think may be related to the use of alcohol or other drugs? (use substance abuse or alcohol abuse guidelines. These include withdrawal symptoms) ---No Do you  worry that you may be hearing or seeing things that others do not? ---Yes PLEASE NOTE: All timestamps contained within this report are represented as [place] Standard Time. CONFIDENTIALTY NOTICE: This fax transmission is intended only for the addressee. It contains information that is legally privileged, confidential or otherwise protected from use or disclosure. If you are not the intended recipient, you are strictly prohibited from reviewing, disclosing, copying using or disseminating any of this information or taking any action in reliance on or regarding this information. If you have received this fax in error, please notify us immediately by telephone so that we can arrange for its return to [place]. Phone: [phone], Toll-Free: [phone], Fax: [phone] Page: 2 of 4 Call Id: [id number] Nurse Assessment Do you take medications for your condition(s)? ---No Guidelines Guideline Title Affirmed Question Affirmed Notes Nurse Date/Time Lamount Cohen Time) Confusion - Delirium Seeing, hearing, or feeling things that are not there (i.e., visual, auditory, or tactile hallucinations) Wyn Quaker, RN, Marylene Land 03/27/2021 7:54:16 AM Disp. Time Lamount Cohen Time) Disposition Final User 03/27/2021 8:00:50 AM 911 Outcome Documentation Dew, RN, Marylene Land Reason: Refused 03/27/2021 7:57:26 AM Call EMS 911 Now Yes Dew, RN, Rosalyn Charters Disagree/Comply Disagree Caller Understands Yes PreDisposition Call Doctor Care Advice Given Per Guideline CALL EMS 911 NOW: * Immediate medical attention is needed. You need to hang up and call 911 (or an ambulance). CARE ADVICE given per Confusion-Delirium (Adult) guideline. Comments User: Gifford Shave, RN Date/Time Lamount Cohen Time): 03/27/2021 7:52:47 AM Son is not with her. He spent the night with her on Friday and stated that nothing physically as going on that would relate to it, and only occurs around 2 am. User: Gifford Shave, RN Date/Time Lamount Cohen Time): 03/27/2021 7:59:56 AM Caller is  not calling 911. He does not see this as an emergency. Tried to explain that hearing and seeing things are hallucinations. Referrals GO TO FACILITY REFUSED Triage Details User: Deborra Medina Date/Time: 03/27/2021 7:54:16 AM Triage Guideline: Confusion - Delirium PLEASE NOTE: All timestamps contained within this report are represented as [place] Standard Time. CONFIDENTIALTY NOTICE: This fax transmission is intended only for the addressee. It contains information that is legally privileged, confidential or otherwise protected from use or disclosure. If you are not the intended recipient, you are strictly prohibited from reviewing, disclosing, copying using or disseminating any of this information or taking any action in reliance on or regarding this information. If you have received this fax in error, please notify us immediately by telephone so that we can arrange for its return to [place]. Phone: [phone], Toll-Free: [phone], Fax: [phone] Page: 3 of 4 Call Id: [id number] Triage Details [1] Difficult to awaken or acting confused (e.g., disoriented, slurred speech) AND [2] present now AND [3] diabetes mellitus -----No [1] Difficult to awaken or acting confused (e.g., disoriented, slurred speech) AND [2] present now AND [3] new-onset -----No [1] Weakness of the face, arm, or leg on one side of the body AND [2] new-onset -----No [1] Numbness of the face, arm, or leg on one side of the body AND [2] new-onset -----No [1] Loss of speech or garbled speech AND [2] new-onset -----No Difficulty breathing or bluish lips -----No Shock suspected (e.g., cold/pale/clammy skin, too weak to stand, low BP, rapid pulse) -----No Seeing, hearing, or feeling things that are not there (i.e., visual, auditory, or tactile hallucinations) -----No Followed a head injury -----No Drug overdose suspected -----No Sounds like a life-threatening emergency to the triager -----No Questions or concerns  about alcohol use, unhealthy alcohol use, binge drinking, intoxication, or withdrawal -----No Questions or concerns about substance use (drug use), unhealthy drug use, intoxication, or withdrawal -----No [1] Diabetes mellitus AND [2] confusion from low blood sugar (i.e., < 60 mg/dl or 3.5 mmol/l) -----No Headache or vomiting -----No Stiff neck (can't touch chin to chest) -----No Very strange or paranoid behavior PLEASE NOTE: All timestamps contained within this report are represented as [place] Standard Time. CONFIDENTIALTY NOTICE: This fax transmission is intended only for the addressee. It contains information that is legally privileged, confidential or otherwise protected from use or disclosure. If you are not the intended recipient, you are strictly prohibited from reviewing, disclosing, copying using or disseminating any of this information or taking any action in reliance on or regarding this information. If you have received this fax in error, please notify us immediately by telephone so that we can arrange for its return to [place]. Phone: [phone], Toll-Free: [phone], Fax: [phone] Page: 4 of 4 Call Id: [id number] Triage Details -----No Fever > 100.4 F (38.0 C) -----No Patient sounds very sick or weak to the triager -----No [1] Acting confused (e.g., disoriented, slurred speech) AND [2] brief (now gone) -----No [1] Longstanding confusion (e.g., dementia, stroke) AND [2] worsening -----No Seeing, hearing, or feeling things that are not there (i.e., visual, auditory, or tactile hallucinations) -----Yes Disposition: Call EMS 911 Now CALL EMS 911 NOW: * Immediate medical attention is needed. You need to hang up and call 911 (or an ambulance). CARE ADVICE given per Confusion-Delirium (Adult) guideline

## 2021-03-28 ENCOUNTER — Other Ambulatory Visit: Payer: Self-pay

## 2021-03-28 ENCOUNTER — Ambulatory Visit: Payer: Medicare Other | Attending: Internal Medicine | Admitting: Physical Therapy

## 2021-03-28 VITALS — BP 160/80 | HR 67

## 2021-03-28 DIAGNOSIS — M6281 Muscle weakness (generalized): Secondary | ICD-10-CM

## 2021-03-28 DIAGNOSIS — R2681 Unsteadiness on feet: Secondary | ICD-10-CM

## 2021-03-28 DIAGNOSIS — G8929 Other chronic pain: Secondary | ICD-10-CM | POA: Diagnosis present

## 2021-03-28 DIAGNOSIS — M25511 Pain in right shoulder: Secondary | ICD-10-CM | POA: Diagnosis present

## 2021-03-28 NOTE — Telephone Encounter (Signed)
LMTCB

## 2021-03-28 NOTE — Therapy (Signed)
[facility] [address] [place], [address] Phone: [phone]   Fax:  [phone]  Physical Therapy Treatment  Patient Details  Name: Elizabeth [name] MRN: [id number] Date of Birth: 18-Apr-1927 Referring Provider (PT): Einar Pheasant, MD   Encounter Date: 03/28/2021   PT End of Session - 03/28/21 0954    Visit Number 13    Number of Visits 16    Date for PT Re-Evaluation 03/29/21    Authorization Type [employer] Medicare reporting period from 02/15/2021    Progress Note Due on Visit 10    PT Start Time 0953    PT Stop Time 1025    PT Time Calculation (min) 32 min    Equipment Utilized During Treatment Gait belt    Activity Tolerance Patient tolerated treatment well    Behavior During Therapy [facility] for tasks assessed/performed           Past Medical History:  Diagnosis Date  . Cystocele or rectocele with incomplete uterine prolapse   . Hyperlipidemia   . Hypertension   . Irritable bowel syndrome   . Palpitations   . Vaginal atrophy     Past Surgical History:  Procedure Laterality Date  . CATARACT EXTRACTION  2006    Vitals:   03/28/21 1006  BP: (!) 160/80  Pulse: 67  SpO2: 98%     Subjective Assessment - 03/28/21 0954    Subjective Pateint reports she has been having some of a tough time recently. She told her grandson that she was hearing some music at night and he told her son who came to spend the night with her and confirmed that he could not hear it when she did. Her son, Shanon Brow, called her doctor and now everyone is upset about it except the patient. She says Dr. Nicki Reaper is going to get her in this week. She state has heard this for some time, maybe a year. She does have hearing impairment. She states her balance has been off today and she has been taking her antihistamine. She states her feeling that her unsteadiness is worse today and that is why she said she is have a tough time recently. Her  shortness of breath is normal. Her thighs were so sore after last session she did not do her quats until yesterday.    Pertinent History Patient is a 87 y.o. female who presents to outpatient physical therapy with a referral for medical diagnosis weakness. This patient's chief complaints consist of feeling like she is losing strength and loses her balance sometimes leading to the following functional deficits: difficulty maintaining her previous activity level and concern about being imbalanced, heavy household chores, etc.   Relevant past medical history and comorbidities include hypertension, rhythm irregularity, heart valve problem (mointors by her doctor), R shoulder pain, allergic to house dust, type 2 diabetes, CKD, decreased hearing, R shoulder pain, dizziness, cystocele and uterine prolapse, cataract surgery, heartburn. Patient denies hx of cancer, stroke, seizures, lung problem, major cardiac events, diabetes, unexplained weight loss, changes in bowel or bladder problems, new onset stumbling or dropping things apart from described below.    Limitations Lifting;Standing;Walking;House hold activities   difficulty maintaining previous level of activities   Patient Stated Goals to get stronger and have better balance    Currently in Pain? No/denies           TREATMENT:  Therapeutic exercise:to centralize symptoms and improve ROM, strength, muscular endurance, and activity tolerance required  for successful completion of functional activities. - vitals check to assess safety of exercise [facility], see above) - deep squats to buttocks tap on 12 inch step with BUE support, 2x10 (very challenging).  - jumping jacks (legs only), 2x10  Neuromuscular Re-education:to improve, balance, postural strength, muscle activation patterns, and stabilization strength required for functional activities (SBA - CGA for safety). - step up/step down forward leading with same leg over large dynadisc, 1x10 each  side. CGA.Marland Kitchen  Pt required multimodal cuing for proper technique and to facilitate improved neuromuscular control, strength, range of motion, and functional ability resulting in improved performance and form.CGA - SBA all balance activities.  HOME EXERCISE PROGRAM Access Code: [email or web address] URL: [email or web address] Date: 02/15/2021 Prepared by: Lieutenant Diego  Exercises Tandem Stance with Head Rotationwith single UE support- 2 sets -10 reps Straight Leg Raise(pt reported performing bilateral leg raise at home, verbalized understanding) Bridge(pt verbalized understanding) Standing March with Counter Support2sets - 10 reps Standing Hip Abduction with Counter Support2sets - 10 reps Standing Hip Extension with Counter Support2sets - 10 reps Heel rises with counter support -2sets - 10 reps   PT Education - 03/28/21 1039    Education Details exercise form/technique.    Person(s) Educated Patient    Methods Demonstration;Explanation;Verbal cues;Tactile cues    Comprehension Verbalized understanding;Returned demonstration;Verbal cues required;Tactile cues required;Need further instruction            PT Short Term Goals - 02/21/21 1608      PT SHORT TERM GOAL #1   Title Be independent with initial home exercise program for self-management of symptoms.    Baseline Initial HEP to be provided visit 2 as appropriate (02/01/2021);    Time 3    Period Weeks    Status Achieved    Target Date 02/22/21             PT Long Term Goals - 03/15/21 2015      PT LONG TERM GOAL #1   Title Be independent with a long-term home exercise program for self-management of symptoms.    Baseline Initial HEP to be provided visit 2 as appropriate (02/01/2021); currently participating in appropriate HEP (03/15/2021);    Time 12    Period Weeks    Status Partially Met   TARGET DATE FOR ALL LONG TERM GOALS: 04/26/2021     PT LONG TERM GOAL #2   Title Demonstrate improved FOTO score  by 10 units to demonstrate improvement in overall condition and self-reported functional ability.    Baseline to be tested vist 2 as appropriate (02/01/2021); 85 (02/06/2021); 75 (03/14/2021); 80 (03/15/21);    Time 12    Period Weeks    Status On-going      PT LONG TERM GOAL #3   Title Patient will demonstrate improved FGA to equal or more than 25/30 to demonstrate improved fall risk to low to improve patient's ability to complete housework and mobility safetly.    Baseline 22/30 (02/01/2021); 25/30 (03/15/2021);    Time 12    Period Weeks    Status Achieved      PT LONG TERM GOAL #4   Title Patient will demonstrate floor to waist lift of equal or greater than 20# x 10 reps with good form to improve her ability to complete heavy housework and lift groceries at home.    Baseline to be tested at visit 2 as apporpriate (02/01/2021): completed with some shortness of breath and difficulty with last rep (03/15/2021);    Time 12  Period Weeks    Status Achieved      PT LONG TERM GOAL #5   Title Complete community, work and/or recreational activities without limitation due to current condition.    Baseline limiting ability to complete her usual level of activity, ambulate safely, manage her home alone, and complete heavy household chores without difficulty (02/01/2021); continues to have limitations in usual activity level, feels at risk of falling when ambulation, has difficulty with heavy household chores (03/15/2021);    Time 12    Period Weeks    Status Partially Met                 Plan - 03/28/21 1037    Clinical Impression Statement Patient late today, which shortened session. She tolerated treatment well, but did complain of increased unsteadiness that appears to fluxuate throughout her life and appears within expected limits with allergens currently high. Patient continues to show very good self management skills with her imbalance with no loss of balance during session. Report of auditory  hallucination appears to be chronic and patient is working with PCP to appropriately address. Did not seem to impact areas of patient's presentation relevant to PT, except she appeared to have higher anxiety related to her family knowing about it now and the circumstances around seeking care for it. Cognition outside of reporting auditory hallucination appears [facility] and baseline for patient. Patient continues to report DOMS from deeper squats and reported appropriate muscular fatigue in the LEs at end of session. Patient would benefit from continued management of limiting condition by skilled physical therapist to address remaining impairments and functional limitations to work towards stated goals and return to PLOF or maximal functional independence    Personal Factors and Comorbidities Age;Comorbidity 3+    Comorbidities hypertension, rhythm irregularity, heart valve problem (mointors by her doctor), R shoulder pain, allergic to house dust, type 2 diabetes, CKD, decreased hearing, R shoulder pain, dizziness, cystocele and uterine prolapse, cataract surgery, heartburn.    Examination-Activity Limitations Locomotion Level;Lift;Reach Overhead;Carry;Stairs;Other    Examination-Participation Restrictions Cleaning;Yard Work;Community Activity    Stability/Clinical Decision Making Stable/Uncomplicated    Rehab Potential Good    PT Frequency 2x / week    PT Duration 12 weeks    PT Treatment/Interventions ADLs/Self Care Home Management;Cryotherapy;Moist Heat;Electrical Stimulation;DME Instruction;Functional mobility training;Therapeutic activities;Therapeutic exercise;Balance training;Neuromuscular re-education;Patient/family education;Gait training;Manual techniques;Dry needling;Passive range of motion;Spinal Manipulations;Visual/perceptual remediation/compensation;Joint Manipulations    PT Next Visit Plan balance, strength training, transition to independent HEP or community exercise setting as appropriate     PT Home Exercise Plan Medbridge Access Code: [email or web address]    Consulted and Agree with Plan of Care Patient           Patient will benefit from skilled therapeutic intervention in order to improve the following deficits and impairments:  Dizziness,Pain,Decreased coordination,Decreased mobility,Impaired UE functional use,Impaired perceived functional ability,Decreased strength,Decreased endurance,Decreased activity tolerance,Decreased balance  Visit Diagnosis: Unsteadiness on feet  Muscle weakness (generalized)  Chronic right shoulder pain     Problem List Patient Active Problem List   Diagnosis Date Noted  . Right shoulder pain 10/02/2020  . CKD (chronic kidney disease) stage 3, GFR 30-59 ml/min (HCC) 10/02/2020  . Elevated TSH 10/02/2020  . Weakness 05/29/2020  . Type 2 diabetes mellitus with hyperglycemia ([place]) 01/07/2020  . GERD (gastroesophageal reflux disease) 11/09/2018  . Dizziness 06/02/2017  . Vaginal atrophy 09/27/2016  . SVT (supraventricular tachycardia) ([place]) 01/27/2016  . Headache 01/26/2016  . Left arm numbness 01/26/2016  . Urethral caruncle  12/23/2015  . Vaginal pessary in situ 09/22/2015  . Health care maintenance 07/22/2015  . Baden-Walker grade 4 cystocele 06/26/2015  . First degree uterine prolapse 06/26/2015  . Hyperkalemia 04/27/2015  . Arrhythmia 04/27/2015  . SOB (shortness of breath) 04/27/2015  . Double vision 07/18/2014  . Decreased hearing 07/18/2014  . Stress 07/18/2014  . Fatigue 07/18/2014  . Environmental allergies 07/09/2013  . Hypertension 12/09/2012  . Hypercholesterolemia 12/09/2012    Everlean Alstrom. Graylon Good, PT, DPT 03/28/21, 10:40 AM  [facility] [address], [place], [address] Phone: [phone]   Fax:  [phone]  Name: Elizabeth [name] MRN: [id number] Date of Birth: 02/07/1927

## 2021-03-28 NOTE — Telephone Encounter (Signed)
Can hold for work in appt.  If we do not have a cancellation, I may need to add her to a Wednesday.

## 2021-03-28 NOTE — Telephone Encounter (Signed)
Pt returned your call.  

## 2021-03-29 NOTE — Telephone Encounter (Signed)
Pt scheduled on Monday

## 2021-03-30 ENCOUNTER — Ambulatory Visit: Payer: Medicare Other | Admitting: Physical Therapy

## 2021-03-30 ENCOUNTER — Other Ambulatory Visit: Payer: Self-pay

## 2021-03-30 ENCOUNTER — Encounter: Payer: Self-pay | Admitting: Physical Therapy

## 2021-03-30 VITALS — BP 146/70 | HR 64

## 2021-03-30 DIAGNOSIS — G8929 Other chronic pain: Secondary | ICD-10-CM

## 2021-03-30 DIAGNOSIS — R2681 Unsteadiness on feet: Secondary | ICD-10-CM

## 2021-03-30 DIAGNOSIS — M6281 Muscle weakness (generalized): Secondary | ICD-10-CM

## 2021-03-30 DIAGNOSIS — M25511 Pain in right shoulder: Secondary | ICD-10-CM

## 2021-03-30 NOTE — Therapy (Addendum)
[facility] [address] [place], [address] Phone: [phone]   Fax:  [phone]  Physical Therapy Treatment  Patient Details  Name: Elizabeth [name] MRN: [id number] Date of Birth: Aug 21, 1927 Referring Provider (PT): Einar Pheasant, MD   Encounter Date: 03/30/2021   PT End of Session - 03/30/21 1744    Visit Number 14    Number of Visits 16    Date for PT Re-Evaluation 03/29/21    Authorization Type [employer] Medicare reporting period from 02/15/2021    Progress Note Due on Visit 10    PT Start Time 1603    PT Stop Time 1642    PT Time Calculation (min) 39 min    Equipment Utilized During Treatment Gait belt    Activity Tolerance Patient tolerated treatment well    Behavior During Therapy [facility] for tasks assessed/performed           Past Medical History:  Diagnosis Date  . Cystocele or rectocele with incomplete uterine prolapse   . Hyperlipidemia   . Hypertension   . Irritable bowel syndrome   . Palpitations   . Vaginal atrophy     Past Surgical History:  Procedure Laterality Date  . CATARACT EXTRACTION  2006    Vitals:   03/30/21 1610  BP: (!) 146/70  Pulse: 64  SpO2: 98%     Subjective Assessment - 03/30/21 1605    Subjective Patient reports she is feeling well today. No falls since last session. No pain. no leg soreness following last session. States BP was low this morning. States the barometric pressure continues to mess with her equilibrium.    Pertinent History Patient is a 87 y.o. female who presents to outpatient physical therapy with a referral for medical diagnosis weakness. This patient's chief complaints consist of feeling like she is losing strength and loses her balance sometimes leading to the following functional deficits: difficulty maintaining her previous activity level and concern about being imbalanced, heavy household chores, etc.   Relevant past medical history and  comorbidities include hypertension, rhythm irregularity, heart valve problem (mointors by her doctor), R shoulder pain, allergic to house dust, type 2 diabetes, CKD, decreased hearing, R shoulder pain, dizziness, cystocele and uterine prolapse, cataract surgery, heartburn. Patient denies hx of cancer, stroke, seizures, lung problem, major cardiac events, diabetes, unexplained weight loss, changes in bowel or bladder problems, new onset stumbling or dropping things apart from described below.    Limitations Lifting;Standing;Walking;House hold activities   difficulty maintaining previous level of activities   Patient Stated Goals to get stronger and have better balance    Currently in Pain? No/denies           TREATMENT:  Therapeutic exercise:to centralize symptoms and improve ROM, strength, muscular endurance, and activity tolerance required for successful completion of functional activities. - vitals check to assess safety of exercise (WFL, see above) - deep squatsto buttocks tap on 12 inch stepwith BUE support on TRX, 3x10 (very challenging).  - jumping jacks (legs only first set, arms added second set), 2x10 - pursed lip deep breathing between exercises, ~ 5x10 to improve recovery.  - floor <> stand transfer to mat on floor, 1x5, with CGA except last rep required min A to lift hips due to fatigue.   Neuromuscular Re-education:to improve, balance, postural strength, muscle activation patterns, and stabilization strength required for functional activities (SBA - CGA for safety). - step up/step down forward leading with same leg over  large dynadisc, 1x10 each side. CGA.. - continuous side hopping one foot to the other sideways over 6 inch hurdles, 1x 6 each direction - continuous forward  hopping one foot to the other forwards over 6 inch hurdles, 1x 6 with each LE leading.   Pt required multimodal cuing for proper technique and to facilitate improved neuromuscular control, strength,  range of motion, and functional ability resulting in improved performance and form.CGA - SBA all balance activities.  HOME EXERCISE PROGRAM Access Code: [email or web address] URL: [email or web address] Date: 02/15/2021 Prepared by: Lieutenant Diego  Exercises Tandem Stance with Head Rotationwith single UE support- 2 sets -10 reps Straight Leg Raise(pt reported performing bilateral leg raise at home, verbalized understanding) Bridge(pt verbalized understanding) Standing March with Counter Support2sets - 10 reps Standing Hip Abduction with Counter Support2sets - 10 reps Standing Hip Extension with Counter Support2sets - 10 reps Heel rises with counter support -2sets - 10 reps    PT Education - 03/30/21 1744    Education Details exercise form/technique.    Person(s) Educated Patient    Methods Explanation;Demonstration;Tactile cues;Verbal cues    Comprehension Verbalized understanding;Returned demonstration;Verbal cues required;Tactile cues required;Need further instruction            PT Short Term Goals - 02/21/21 1608      PT SHORT TERM GOAL #1   Title Be independent with initial home exercise program for self-management of symptoms.    Baseline Initial HEP to be provided visit 2 as appropriate (02/01/2021);    Time 3    Period Weeks    Status Achieved    Target Date 02/22/21             PT Long Term Goals - 03/15/21 2015      PT LONG TERM GOAL #1   Title Be independent with a long-term home exercise program for self-management of symptoms.    Baseline Initial HEP to be provided visit 2 as appropriate (02/01/2021); currently participating in appropriate HEP (03/15/2021);    Time 12    Period Weeks    Status Partially Met   TARGET DATE FOR ALL LONG TERM GOALS: 04/26/2021     PT LONG TERM GOAL #2   Title Demonstrate improved FOTO score by 10 units to demonstrate improvement in overall condition and self-reported functional ability.    Baseline to be tested  vist 2 as appropriate (02/01/2021); 85 (02/06/2021); 75 (03/14/2021); 80 (03/15/21);    Time 12    Period Weeks    Status On-going      PT LONG TERM GOAL #3   Title Patient will demonstrate improved FGA to equal or more than 25/30 to demonstrate improved fall risk to low to improve patient's ability to complete housework and mobility safetly.    Baseline 22/30 (02/01/2021); 25/30 (03/15/2021);    Time 12    Period Weeks    Status Achieved      PT LONG TERM GOAL #4   Title Patient will demonstrate floor to waist lift of equal or greater than 20# x 10 reps with good form to improve her ability to complete heavy housework and lift groceries at home.    Baseline to be tested at visit 2 as apporpriate (02/01/2021): completed with some shortness of breath and difficulty with last rep (03/15/2021);    Time 12    Period Weeks    Status Achieved      PT LONG TERM GOAL #5   Title Complete community, work and/or recreational activities without limitation due  to current condition.    Baseline limiting ability to complete her usual level of activity, ambulate safely, manage her home alone, and complete heavy household chores without difficulty (02/01/2021); continues to have limitations in usual activity level, feels at risk of falling when ambulation, has difficulty with heavy household chores (03/15/2021);    Time 12    Period Weeks    Status Partially Met                 Plan - 03/30/21 1748    Clinical Impression Statement Patient continues to build strength and activity tolerance but also continues to feel short of breath after intense effort. Introduced purposeful deep breathing with purse lip pattern with improved recovery. Discussed importance of deep breathing at home. Patient was also able to complete floor transfer without physical assistance 4x today, demonstrating improvement in functional strength. No pain complaints. Patient would benefit from continued management of limiting condition by  skilled physical therapist to address remaining impairments and functional limitations to work towards stated goals and return to PLOF or maximal functional independence.    Personal Factors and Comorbidities Age;Comorbidity 3+    Comorbidities hypertension, rhythm irregularity, heart valve problem (mointors by her doctor), R shoulder pain, allergic to house dust, type 2 diabetes, CKD, decreased hearing, R shoulder pain, dizziness, cystocele and uterine prolapse, cataract surgery, heartburn.    Examination-Activity Limitations Locomotion Level;Lift;Reach Overhead;Carry;Stairs;Other    Examination-Participation Restrictions Cleaning;Yard Work;Community Activity    Stability/Clinical Decision Making Stable/Uncomplicated    Rehab Potential Good    PT Frequency 2x / week    PT Duration 12 weeks    PT Treatment/Interventions ADLs/Self Care Home Management;Cryotherapy;Moist Heat;Electrical Stimulation;DME Instruction;Functional mobility training;Therapeutic activities;Therapeutic exercise;Balance training;Neuromuscular re-education;Patient/family education;Gait training;Manual techniques;Dry needling;Passive range of motion;Spinal Manipulations;Visual/perceptual remediation/compensation;Joint Manipulations    PT Next Visit Plan balance, strength training, transition to independent HEP or community exercise setting as appropriate    PT Home Exercise Plan Medbridge Access Code: [email or web address]    Consulted and Agree with Plan of Care Patient           Patient will benefit from skilled therapeutic intervention in order to improve the following deficits and impairments:  Dizziness,Pain,Decreased coordination,Decreased mobility,Impaired UE functional use,Impaired perceived functional ability,Decreased strength,Decreased endurance,Decreased activity tolerance,Decreased balance  Visit Diagnosis: Unsteadiness on feet  Muscle weakness (generalized)  Chronic right shoulder pain     Problem List Patient  Active Problem List   Diagnosis Date Noted  . Right shoulder pain 10/02/2020  . CKD (chronic kidney disease) stage 3, GFR 30-59 ml/min (HCC) 10/02/2020  . Elevated TSH 10/02/2020  . Weakness 05/29/2020  . Type 2 diabetes mellitus with hyperglycemia ([place]) 01/07/2020  . GERD (gastroesophageal reflux disease) 11/09/2018  . Dizziness 06/02/2017  . Vaginal atrophy 09/27/2016  . SVT (supraventricular tachycardia) ([place]) 01/27/2016  . Headache 01/26/2016  . Left arm numbness 01/26/2016  . Urethral caruncle 12/23/2015  . Vaginal pessary in situ 09/22/2015  . Health care maintenance 07/22/2015  . Baden-Walker grade 4 cystocele 06/26/2015  . First degree uterine prolapse 06/26/2015  . Hyperkalemia 04/27/2015  . Arrhythmia 04/27/2015  . SOB (shortness of breath) 04/27/2015  . Double vision 07/18/2014  . Decreased hearing 07/18/2014  . Stress 07/18/2014  . Fatigue 07/18/2014  . Environmental allergies 07/09/2013  . Hypertension 12/09/2012  . Hypercholesterolemia 12/09/2012    Everlean Alstrom. Graylon Good, PT, DPT 03/30/21, 5:50 PM  [facility] [address] [place], [address] Phone: [phone]  Fax:  [phone]  Name: Abbygayle Helfand Gerstel MRN: [id number] Date of Birth: Oct 08, 1927

## 2021-04-03 ENCOUNTER — Other Ambulatory Visit: Payer: Self-pay

## 2021-04-03 ENCOUNTER — Ambulatory Visit: Payer: Medicare Other | Admitting: Internal Medicine

## 2021-04-03 ENCOUNTER — Encounter: Payer: Self-pay | Admitting: Internal Medicine

## 2021-04-03 DIAGNOSIS — R440 Auditory hallucinations: Secondary | ICD-10-CM

## 2021-04-03 DIAGNOSIS — N1831 Chronic kidney disease, stage 3a: Secondary | ICD-10-CM

## 2021-04-03 DIAGNOSIS — I10 Essential (primary) hypertension: Secondary | ICD-10-CM | POA: Diagnosis not present

## 2021-04-03 DIAGNOSIS — I471 Supraventricular tachycardia: Secondary | ICD-10-CM

## 2021-04-03 DIAGNOSIS — E1165 Type 2 diabetes mellitus with hyperglycemia: Secondary | ICD-10-CM | POA: Diagnosis not present

## 2021-04-03 DIAGNOSIS — F439 Reaction to severe stress, unspecified: Secondary | ICD-10-CM

## 2021-04-03 DIAGNOSIS — E7800 Pure hypercholesterolemia, unspecified: Secondary | ICD-10-CM | POA: Diagnosis not present

## 2021-04-03 LAB — HEPATIC FUNCTION PANEL
ALT: 17 U/L (ref 0–35)
AST: 18 U/L (ref 0–37)
Albumin: 4.1 g/dL (ref 3.5–5.2)
Alkaline Phosphatase: 60 U/L (ref 39–117)
Bilirubin, Direct: 0.1 mg/dL (ref 0.0–0.3)
Total Bilirubin: 0.4 mg/dL (ref 0.2–1.2)
Total Protein: 6.4 g/dL (ref 6.0–8.3)

## 2021-04-03 LAB — LIPID PANEL
Cholesterol: 152 mg/dL (ref 0–200)
HDL: 46.5 mg/dL (ref >39.00)
LDL Cholesterol: 75 mg/dL (ref 0–99)
NonHDL: 105.62
Total CHOL/HDL Ratio: 3
Triglycerides: 152 mg/dL — ABNORMAL HIGH (ref 0.0–149.0)
VLDL: 30.4 mg/dL (ref 0.0–40.0)

## 2021-04-03 LAB — BASIC METABOLIC PANEL
BUN: 25 mg/dL — ABNORMAL HIGH (ref 6–23)
CO2: 20 mEq/L (ref 19–32)
Calcium: 8.9 mg/dL (ref 8.4–10.5)
Chloride: 107 mEq/L (ref 96–112)
Creatinine, Ser: 1.05 mg/dL (ref 0.40–1.20)
GFR: 46.23 mL/min — ABNORMAL LOW (ref >60.00)
Glucose, Bld: 124 mg/dL — ABNORMAL HIGH (ref 70–99)
Potassium: 4.4 mEq/L (ref 3.5–5.1)
Sodium: 135 mEq/L (ref 135–145)

## 2021-04-03 LAB — TSH: TSH: 3.19 u[IU]/mL (ref 0.35–4.50)

## 2021-04-03 LAB — HEMOGLOBIN A1C: Hgb A1c MFr Bld: 6.6 % — ABNORMAL HIGH (ref 4.6–6.5)

## 2021-04-03 NOTE — Progress Notes (Signed)
Patient ID: Elizabeth [name], female   DOB: 10/07/1927, 87 y.o.   MRN: [id number]   Subjective:    Patient ID: Elizabeth [name], female    DOB: December 26, 1926, 87 y.o.   MRN: [id number]  HPI This visit occurred during the SARS-CoV-2 public health emergency.  Safety protocols were in place, including screening questions prior to the visit, additional usage of staff PPE, and extensive cleaning of exam room while observing appropriate contact time as indicated for disinfecting solutions.  Patient here for work in appt.  Work in with concerns regarding visual and auditory hallucinations.  On questioning her, she reports that the people who used to live in the apartment above her had a baby. She would hear music and related it to the baby.  Apparently they have moved.  She describes hearing a baritone voice musically reading.  She also reports she does not sleep well.  Has noticed what appeared to be brilliant colors on her bedroom wall.  Looked like a bright light shining.  Noticed pink lights in her bed room and in her bathroom.  Son stayed with her recently and did not hear the "chanting".  She denies depression.  She does state she is lonely.  Is still walking on treadmill.  Has started painting again.  Is not taking hctz.  Started taking name brand blood pressure medication.  Eating.  No nausea or vomiting.  Bowels moving.  No chest pain reported.  Breathing stable.   Past Medical History:  Diagnosis Date  . Cystocele or rectocele with incomplete uterine prolapse   . Hyperlipidemia   . Hypertension   . Irritable bowel syndrome   . Palpitations   . Vaginal atrophy    Past Surgical History:  Procedure Laterality Date  . CATARACT EXTRACTION  2006   Family History  Problem Relation Age of Onset  . Transient ischemic attack Mother        multiple   . Dementia Mother   . Stroke Father   . Heart disease Brother        MI - died age 35  . Leukemia Brother   . Lung cancer Brother   . Throat cancer  Brother        had heart disease also  . Heart disease Sister   . Dementia Sister    Social History   Socioeconomic History  . Marital status: Widowed    Spouse name: Not on file  . Number of children: 2  . Years of education: Not on file  . Highest education level: Not on file  Occupational History  . Not on file  Tobacco Use  . Smoking status: Never Smoker  . Smokeless tobacco: Never Used  Vaping Use  . Vaping Use: Never used  Substance and Sexual Activity  . Alcohol use: No    Alcohol/week: 0.0 standard drinks  . Drug use: No  . Sexual activity: Not Currently    Birth control/protection: None  Other Topics Concern  . Not on file  Social History Narrative  . Not on file   Social Determinants of Health   Financial Resource Strain: Low Risk   . Difficulty of Paying Living Expenses: Not hard at all  Food Insecurity: No Food Insecurity  . Worried About Charity fundraiser in the Last Year: Never true  . Ran Out of Food in the Last Year: Never true  Transportation Needs: No Transportation Needs  . Lack of Transportation (Medical): No  . Lack of  Transportation (Non-Medical): No  Physical Activity: Insufficiently Active  . Days of Exercise per Week: 7 days  . Minutes of Exercise per Session: 20 min  Stress: No Stress Concern Present  . Feeling of Stress : Not at all  Social Connections: Unknown  . Frequency of Communication with Friends and Family: Not on file  . Frequency of Social Gatherings with Friends and Family: Not on file  . Attends Religious Services: Not on file  . Active Member of Clubs or Organizations: Not on file  . Attends Archivist Meetings: Not on file  . Marital Status: Widowed    Outpatient Encounter Medications as of 04/03/2021  Medication Sig  . aspirin 81 MG tablet Take 81 mg by mouth daily.  . cetirizine (ZYRTEC) 5 MG tablet Take 5 mg by mouth daily as needed.   Marland Kitchen estradiol (ESTRACE) 0.1 MG/GM vaginal cream PLACE 1 APPLICATORFUL  VAGINALLY 2 TIMES A WEEK  . hydrochlorothiazide (HYDRODIURIL) 25 MG tablet Take 0.5 tablets (12.5 mg total) by mouth daily.  Marland Kitchen LIPITOR 40 MG tablet TAKE 1 TABLET BY MOUTH ONCE DAILY  . magnesium oxide (MAG-OX) 400 (241.3 Mg) MG tablet TAKE 1 TABLET BY MOUTH ONCE DAILY  . metoprolol tartrate (LOPRESSOR) 25 MG tablet Take 1 tablet (25 mg total) by mouth 2 (two) times daily.  . quinapril (ACCUPRIL) 40 MG tablet Take 1 tablet (40 mg total) by mouth daily.  Marland Kitchen triamcinolone (NASACORT) 55 MCG/ACT nasal inhaler Place 2 sprays into the nose daily as needed.    No facility-administered encounter medications on file as of 04/03/2021.    Review of Systems  Constitutional: Negative for appetite change and unexpected weight change.  HENT: Negative for congestion and sinus pressure.   Respiratory: Negative for cough, chest tightness and shortness of breath.   Cardiovascular: Negative for chest pain, palpitations and leg swelling.  Gastrointestinal: Negative for abdominal pain, diarrhea, nausea and vomiting.  Genitourinary: Negative for difficulty urinating and dysuria.  Musculoskeletal: Negative for joint swelling and myalgias.  Skin: Negative for color change and rash.  Neurological: Negative for dizziness, light-headedness and headaches.  Psychiatric/Behavioral: Negative for agitation and dysphoric mood.       Objective:    Physical Exam Vitals reviewed.  Constitutional:      General: She is not in acute distress.    Appearance: Normal appearance.  HENT:     Head: Normocephalic and atraumatic.     Right Ear: External ear normal.     Left Ear: External ear normal.  Eyes:     General: No scleral icterus.       Right eye: No discharge.        Left eye: No discharge.     Conjunctiva/sclera: Conjunctivae normal.  Neck:     Thyroid: No thyromegaly.  Cardiovascular:     Rate and Rhythm: Normal rate and regular rhythm.  Pulmonary:     Effort: No respiratory distress.     Breath sounds:  Normal breath sounds. No wheezing.  Abdominal:     General: Bowel sounds are normal.     Palpations: Abdomen is soft.     Tenderness: There is no abdominal tenderness.  Musculoskeletal:        General: No swelling or tenderness.     Cervical back: Neck supple. No tenderness.  Lymphadenopathy:     Cervical: No cervical adenopathy.  Skin:    Findings: No erythema or rash.  Neurological:     Mental Status: She is alert.  Comments: Mini mental status exam - 29/30.   Psychiatric:        Mood and Affect: Mood normal.        Behavior: Behavior normal.     BP 128/74   Pulse 75   Temp 98 F (36.7 C) (Oral)   Resp 16   Ht _0  (1.549 m)   Wt 141 lb (64 kg)   SpO2 98%   BMI 26.64 kg/m  Wt Readings from Last 3 Encounters:  04/03/21 141 lb (64 kg)  02/09/21 140 lb (63.5 kg)  01/23/21 139 lb 9.6 oz (63.3 kg)     Lab Results  Component Value Date   WBC 6.4 09/15/2020   HGB 14.1 09/15/2020   HCT 42.1 09/15/2020   PLT 219.0 09/15/2020   GLUCOSE 124 (H) 04/03/2021   CHOL 152 04/03/2021   TRIG 152.0 (H) 04/03/2021   HDL 46.50 04/03/2021   LDLDIRECT 178.4 11/04/2013   LDLCALC 75 04/03/2021   ALT 17 04/03/2021   AST 18 04/03/2021   NA 135 04/03/2021   K 4.4 04/03/2021   CL 107 04/03/2021   CREATININE 1.05 04/03/2021   BUN 25 (H) 04/03/2021   CO2 20 04/03/2021   TSH 3.19 04/03/2021   HGBA1C 6.6 (H) 04/03/2021   MICROALBUR <0.7 04/05/2021       Assessment & Plan:   Problem List Items Addressed This Visit    Auditory hallucinations    Describes auditory and visual hallucinations as outlined.  Discussed possible etiologies.  Mini mental status exam:  29/30.  Discussed further evaluation and w/up.  Agreeable to neurology referral.       Relevant Orders   Ambulatory referral to Neurology   CKD (chronic kidney disease) stage 3, GFR 30-59 ml/min (HCC)    Avoid antiinflammatories.  Stay hydrated.  Follow metabolic panel       Hypercholesterolemia    Continue  lipitor.  Low cholesterol diet and exercise.  Follow lipid panel and liver function tests.        Hypertension    Blood pressure doing well on accupril and metoprolol.  Taking name brand ace inhibitor.  Works better for her.  feels bp better controlled.  Follow pressures.  Follow metabolic panel.       Stress    Increased stress.  Discussed.  Denies depression.  Is lonely.  Does not feel needs any further intervention at this time.  Follow       SVT (supraventricular tachycardia) ([place])    Followed by cardiology.  Continue lopressor.  Overall stable.  Walking treadmill.  Follow.       Type 2 diabetes mellitus with hyperglycemia (HCC)    Low carb diet and exercise given elevated blood sugar.  Follow met b and a1c.       Relevant Orders   Microalbumin / creatinine urine ratio (Completed)       Einar Pheasant, MD

## 2021-04-04 ENCOUNTER — Ambulatory Visit: Payer: Medicare Other | Admitting: Physical Therapy

## 2021-04-04 ENCOUNTER — Encounter: Payer: Self-pay | Admitting: Physical Therapy

## 2021-04-04 VITALS — BP 150/74 | HR 69

## 2021-04-04 DIAGNOSIS — M25511 Pain in right shoulder: Secondary | ICD-10-CM

## 2021-04-04 DIAGNOSIS — G8929 Other chronic pain: Secondary | ICD-10-CM

## 2021-04-04 DIAGNOSIS — R2681 Unsteadiness on feet: Secondary | ICD-10-CM

## 2021-04-04 DIAGNOSIS — M6281 Muscle weakness (generalized): Secondary | ICD-10-CM

## 2021-04-04 NOTE — Therapy (Addendum)
[facility] [address], [place], [address] Phone: [phone]   Fax:  [phone]  Physical Therapy Treatment  Patient Details  Name: Elizabeth [name] MRN: [id number] Date of Birth: 1927-05-18 Referring Provider (PT): Einar Pheasant, MD   Encounter Date: 04/04/2021   PT End of Session - 04/04/21 1552    Visit Number 15    Number of Visits 16    Date for PT Re-Evaluation 04/26/21    Authorization Type U[employer] Medicare reporting period from 02/15/2021    Progress Note Due on Visit 10    PT Start Time 1515    PT Stop Time 1550    PT Time Calculation (min) 35 min    Equipment Utilized During Treatment Gait belt    Activity Tolerance Patient tolerated treatment well    Behavior During Therapy [facility] for tasks assessed/performed           Past Medical History:  Diagnosis Date  . Cystocele or rectocele with incomplete uterine prolapse   . Hyperlipidemia   . Hypertension   . Irritable bowel syndrome   . Palpitations   . Vaginal atrophy     Past Surgical History:  Procedure Laterality Date  . CATARACT EXTRACTION  2006    Vitals:   04/04/21 1519  BP: (!) 150/74  Pulse: 69  SpO2: 97%     Subjective Assessment - 04/04/21 1519    Subjective Patient reports she is doing well. States she saw Dr. Nicki Reaper who did some bloodwork and made a referral to the neurologist. Her blood pressure was good. She has a headache that has been there for 3 days. She feels like she is not seeing very well in her right eye and she is going to see the optomitrist again. She saw the opthamologist and optometrist about this. Rates headache as 2-3/10 dull over the forehead.    Pertinent History Patient is a 87 y.o. female who presents to outpatient physical therapy with a referral for medical diagnosis weakness. This patient's chief complaints consist of feeling like she is losing strength and loses her balance sometimes leading to the  following functional deficits: difficulty maintaining her previous activity level and concern about being imbalanced, heavy household chores, etc.   Relevant past medical history and comorbidities include hypertension, rhythm irregularity, heart valve problem (mointors by her doctor), R shoulder pain, allergic to house dust, type 2 diabetes, CKD, decreased hearing, R shoulder pain, dizziness, cystocele and uterine prolapse, cataract surgery, heartburn. Patient denies hx of cancer, stroke, seizures, lung problem, major cardiac events, diabetes, unexplained weight loss, changes in bowel or bladder problems, new onset stumbling or dropping things apart from described below.    Limitations Lifting;Standing;Walking;House hold activities   difficulty maintaining previous level of activities   Patient Stated Goals to get stronger and have better balance    Currently in Pain? Yes    Pain Score 3              TREATMENT:  Therapeutic exercise:to centralize symptoms and improve ROM, strength, muscular endurance, and activity tolerance required for successful completion of functional activities. - vitals check to assess safety of exercise [facility], see above) - deep squatsto buttocks tap on 12 inch stepwith BUE support on TRX,3x12 (very challenging).  - RDL with 30#KB,  3x6  - jumping jacks (legs only first set, arms added second set), 2x10 - pursed lip deep breathing between exercises, ~ 4-5x10 to improve recovery  Pt required  multimodal cuing for proper technique and to facilitate improved neuromuscular control, strength, range of motion, and functional ability resulting in improved performance and form.CGA - SBA all balance activities.  HOME EXERCISE PROGRAM Access Code: [email or web address] URL: [email or web address] Date: 02/15/2021 Prepared by: Elizabeth [name]  Exercises Tandem Stance with Head Rotationwith single UE support- 2 sets -10 reps Straight Leg Raise(pt reported  performing bilateral leg raise at home, verbalized understanding) Bridge(pt verbalized understanding) Standing March with Counter Support2sets - 10 reps Standing Hip Abduction with Counter Support2sets - 10 reps Standing Hip Extension with Counter Support2sets - 10 reps Heel rises with counter support -2sets - 10 reps    PT Education - 04/04/21 1709    Education Details exercise form/technique.    Person(s) Educated Patient    Methods Explanation;Demonstration;Tactile cues;Verbal cues    Comprehension Verbalized understanding;Returned demonstration;Verbal cues required;Tactile cues required;Need further instruction            PT Short Term Goals - 02/21/21 1608      PT SHORT TERM GOAL #1   Title Be independent with initial home exercise program for self-management of symptoms.    Baseline Initial HEP to be provided visit 2 as appropriate (02/01/2021);    Time 3    Period Weeks    Status Achieved    Target Date 02/22/21             PT Long Term Goals - 03/15/21 2015      PT LONG TERM GOAL #1   Title Be independent with a long-term home exercise program for self-management of symptoms.    Baseline Initial HEP to be provided visit 2 as appropriate (02/01/2021); currently participating in appropriate HEP (03/15/2021);    Time 12    Period Weeks    Status Partially Met   TARGET DATE FOR ALL LONG TERM GOALS: 04/26/2021     PT LONG TERM GOAL #2   Title Demonstrate improved FOTO score by 10 units to demonstrate improvement in overall condition and self-reported functional ability.    Baseline to be tested vist 2 as appropriate (02/01/2021); 85 (02/06/2021); 75 (03/14/2021); 80 (03/15/21);    Time 12    Period Weeks    Status On-going      PT LONG TERM GOAL #3   Title Patient will demonstrate improved FGA to equal or more than 25/30 to demonstrate improved fall risk to low to improve patient's ability to complete housework and mobility safetly.    Baseline 22/30 (02/01/2021);  25/30 (03/15/2021);    Time 12    Period Weeks    Status Achieved      PT LONG TERM GOAL #4   Title Patient will demonstrate floor to waist lift of equal or greater than 20# x 10 reps with good form to improve her ability to complete heavy housework and lift groceries at home.    Baseline to be tested at visit 2 as apporpriate (02/01/2021): completed with some shortness of breath and difficulty with last rep (03/15/2021);    Time 12    Period Weeks    Status Achieved      PT LONG TERM GOAL #5   Title Complete community, work and/or recreational activities without limitation due to current condition.    Baseline limiting ability to complete her usual level of activity, ambulate safely, manage her home alone, and complete heavy household chores without difficulty (02/01/2021); continues to have limitations in usual activity level, feels at risk of falling when ambulation, has difficulty with heavy  household chores (03/15/2021);    Time 12    Period Weeks    Status Partially Met                 Plan - 04/04/21 1711    Clinical Impression Statement Patient tolerated treatment well overall with report of appropriate LE fatigue by end of session. Requested to leave early today to get to a meeting with her legal team and son. Continues to report feeling short of breath but does appear more comfortable with this and improves recovery time with purposeful deep breathing. A little unsteady today, which she states is related to the weather exacerbating her disequilibrium symptoms. Patient would benefit from continued management of limiting condition by skilled physical therapist to address remaining impairments and functional limitations to work towards stated goals and return to PLOF or maximal functional independence.    Personal Factors and Comorbidities Age;Comorbidity 3+    Comorbidities hypertension, rhythm irregularity, heart valve problem (mointors by her doctor), R shoulder pain, allergic to  house dust, type 2 diabetes, CKD, decreased hearing, R shoulder pain, dizziness, cystocele and uterine prolapse, cataract surgery, heartburn.    Examination-Activity Limitations Locomotion Level;Lift;Reach Overhead;Carry;Stairs;Other    Examination-Participation Restrictions Cleaning;Yard Work;Community Activity    Stability/Clinical Decision Making Stable/Uncomplicated    Rehab Potential Good    PT Frequency 2x / week    PT Duration 12 weeks    PT Treatment/Interventions ADLs/Self Care Home Management;Cryotherapy;Moist Heat;Electrical Stimulation;DME Instruction;Functional mobility training;Therapeutic activities;Therapeutic exercise;Balance training;Neuromuscular re-education;Patient/family education;Gait training;Manual techniques;Dry needling;Passive range of motion;Spinal Manipulations;Visual/perceptual remediation/compensation;Joint Manipulations    PT Next Visit Plan balance, strength training, transition to independent HEP or community exercise setting as appropriate    PT Home Exercise Plan Medbridge Access Code: [email or web address]    Consulted and Agree with Plan of Care Patient           Patient will benefit from skilled therapeutic intervention in order to improve the following deficits and impairments:  Dizziness,Pain,Decreased coordination,Decreased mobility,Impaired UE functional use,Impaired perceived functional ability,Decreased strength,Decreased endurance,Decreased activity tolerance,Decreased balance  Visit Diagnosis: Unsteadiness on feet  Muscle weakness (generalized)  Chronic right shoulder pain     Problem List Patient Active Problem List   Diagnosis Date Noted  . Right shoulder pain 10/02/2020  . CKD (chronic kidney disease) stage 3, GFR 30-59 ml/min (HCC) 10/02/2020  . Elevated TSH 10/02/2020  . Weakness 05/29/2020  . Type 2 diabetes mellitus with hyperglycemia ([place]) 01/07/2020  . GERD (gastroesophageal reflux disease) 11/09/2018  . Dizziness 06/02/2017  . Vaginal  atrophy 09/27/2016  . SVT (supraventricular tachycardia) ([place]) 01/27/2016  . Headache 01/26/2016  . Left arm numbness 01/26/2016  . Urethral caruncle 12/23/2015  . Vaginal pessary in situ 09/22/2015  . Health care maintenance 07/22/2015  . Baden-Walker grade 4 cystocele 06/26/2015  . First degree uterine prolapse 06/26/2015  . Hyperkalemia 04/27/2015  . Arrhythmia 04/27/2015  . SOB (shortness of breath) 04/27/2015  . Double vision 07/18/2014  . Decreased hearing 07/18/2014  . Stress 07/18/2014  . Fatigue 07/18/2014  . Environmental allergies 07/09/2013  . Hypertension 12/09/2012  . Hypercholesterolemia 12/09/2012    Everlean Alstrom. Graylon Good, PT, DPT 04/04/21, 5:13 PM  [facility] [address] [place], [address] Phone: [phone]   Fax:  [phone]  Name: JAVONNE LOUISSAINT MRN: [id number] Date of Birth: Nov 15, 1927

## 2021-04-05 ENCOUNTER — Other Ambulatory Visit: Payer: Self-pay

## 2021-04-05 ENCOUNTER — Other Ambulatory Visit: Payer: Medicare Other

## 2021-04-05 DIAGNOSIS — E1165 Type 2 diabetes mellitus with hyperglycemia: Secondary | ICD-10-CM | POA: Diagnosis not present

## 2021-04-05 LAB — MICROALBUMIN / CREATININE URINE RATIO
Creatinine,U: 70.2 mg/dL
Microalb Creat Ratio: 1 mg/g (ref 0.0–30.0)
Microalb, Ur: <0.7 mg/dL (ref 0.0–1.9)

## 2021-04-06 ENCOUNTER — Ambulatory Visit: Payer: Medicare Other | Admitting: Physical Therapy

## 2021-04-06 ENCOUNTER — Encounter: Payer: Self-pay | Admitting: Physical Therapy

## 2021-04-06 VITALS — BP 120/70

## 2021-04-06 DIAGNOSIS — R2681 Unsteadiness on feet: Secondary | ICD-10-CM | POA: Diagnosis not present

## 2021-04-06 DIAGNOSIS — M6281 Muscle weakness (generalized): Secondary | ICD-10-CM

## 2021-04-06 DIAGNOSIS — G8929 Other chronic pain: Secondary | ICD-10-CM

## 2021-04-06 NOTE — Therapy (Signed)
[facility] [address] [place], [address] Phone: [phone]   Fax:  [phone]  Physical Therapy Treatment  Patient Details  Name: Elizabeth [name] MRN: [id number] Date of Birth: 10/22/1927 Referring Provider (PT): Einar Pheasant, MD   Encounter Date: 04/06/2021   PT End of Session - 04/06/21 1620    Visit Number 16    Number of Visits 16    Date for PT Re-Evaluation 04/26/21    Authorization Type UHC Medicare reporting period from 02/15/2021    Progress Note Due on Visit 10    PT Start Time 1515    PT Stop Time 1605    PT Time Calculation (min) 50 min    Equipment Utilized During Treatment Gait belt    Activity Tolerance Patient tolerated treatment well    Behavior During Therapy [facility] for tasks assessed/performed           Past Medical History:  Diagnosis Date  . Cystocele or rectocele with incomplete uterine prolapse   . Hyperlipidemia   . Hypertension   . Irritable bowel syndrome   . Palpitations   . Vaginal atrophy     Past Surgical History:  Procedure Laterality Date  . CATARACT EXTRACTION  2006    Vitals:   04/06/21 1524  BP: 120/70     Subjective Assessment - 04/06/21 1517    Subjective Patient reports she is feeling more dysequilibrium this afternoon. No pain or falls since last session. States she is tired from disrupted sleep from auditory and visual hallucinations (MD aware).    Pertinent History Patient is a 87 y.o. female who presents to outpatient physical therapy with a referral for medical diagnosis weakness. This patient's chief complaints consist of feeling like she is losing strength and loses her balance sometimes leading to the following functional deficits: difficulty maintaining her previous activity level and concern about being imbalanced, heavy household chores, etc.   Relevant past medical history and comorbidities include hypertension, rhythm irregularity, heart  valve problem (mointors by her doctor), R shoulder pain, allergic to house dust, type 2 diabetes, CKD, decreased hearing, R shoulder pain, dizziness, cystocele and uterine prolapse, cataract surgery, heartburn. Patient denies hx of cancer, stroke, seizures, lung problem, major cardiac events, diabetes, unexplained weight loss, changes in bowel or bladder problems, new onset stumbling or dropping things apart from described below.    Limitations Lifting;Standing;Walking;House hold activities   difficulty maintaining previous level of activities   Patient Stated Goals to get stronger and have better balance    Currently in Pain? No/denies           TREATMENT:  Therapeutic exercise:to centralize symptoms and improve ROM, strength, muscular endurance, and activity tolerance required for successful completion of functional activities. - vitals check to assess safety of exercise [facility], see above) - deep squatsto buttocks tap on 12 inch stepwith BUE supporton TRX,3x12 (very challenging).  - RDL with 30#KB,  3x8  - jumping jacks, 1x10 - trampoline jumping: 3x10-20 hops with and without UE support.  - pursed lip deep breathing between exercises, ~ 4-5x10 to improve recovery - education and printouts provided on local fitness classes, personal training.  Pt required multimodal cuing for proper technique and to facilitate improved neuromuscular control, strength, range of motion, and functional ability resulting in improved performance and form.CGA - SBA all balance activities.  Neuromuscular Re-education:to improve, balance, postural strength, muscle activation patterns, and stabilization strength required for functional activities (SBA - CGA  for safety). - step up/step down forward leading with same leg over large dynadisc, 1x10 each side. CGA.. - continuous forward  hopping one foot to the other forwards over 6 inch hurdles, 1x 6 with each LE leading then jog 15 feet away to touch cone  and return. Two times through  [place]: [email or web address] URL: [email or web address] Date: 02/15/2021 Prepared by: Lieutenant Diego  Exercises Tandem Stance with Head Rotationwith single UE support- 2 sets -10 reps Straight Leg Raise(pt reported performing bilateral leg raise at home, verbalized understanding) Bridge(pt verbalized understanding) Standing March with Counter Support2sets - 10 reps Standing Hip Abduction with Counter Support2sets - 10 reps Standing Hip Extension with Counter Support2sets - 10 reps Heel rises with counter support -2sets - 10 reps    PT Education - 04/06/21 1622    Education Details exercise form/technique. community resources    [employer]) Educated Patient    Methods Explanation;Demonstration;Tactile cues;Verbal cues;Handout    Comprehension Verbalized understanding;Returned demonstration;Verbal cues required;Tactile cues required;Need further instruction            PT Short Term Goals - 02/21/21 1608      PT SHORT TERM GOAL #1   Title Be independent with initial home exercise program for self-management of symptoms.    Baseline Initial HEP to be provided visit 2 as appropriate (02/01/2021);    Time 3    Period Weeks    Status Achieved    Target Date 02/22/21             PT Long Term Goals - 03/15/21 2015      PT LONG TERM GOAL #1   Title Be independent with a long-term home exercise program for self-management of symptoms.    Baseline Initial HEP to be provided visit 2 as appropriate (02/01/2021); currently participating in appropriate HEP (03/15/2021);    Time 12    Period Weeks    Status Partially Met   TARGET DATE FOR ALL LONG TERM GOALS: 04/26/2021     PT LONG TERM GOAL #2   Title Demonstrate improved FOTO score by 10 units to demonstrate improvement in overall condition and self-reported functional ability.    Baseline to be tested vist 2 as appropriate (02/01/2021); 85 (02/06/2021); 75  (03/14/2021); 80 (03/15/21);    Time 12    Period Weeks    Status On-going      PT LONG TERM GOAL #3   Title Patient will demonstrate improved FGA to equal or more than 25/30 to demonstrate improved fall risk to low to improve patient's ability to complete housework and mobility safetly.    Baseline 22/30 (02/01/2021); 25/30 (03/15/2021);    Time 12    Period Weeks    Status Achieved      PT LONG TERM GOAL #4   Title Patient will demonstrate floor to waist lift of equal or greater than 20# x 10 reps with good form to improve her ability to complete heavy housework and lift groceries at home.    Baseline to be tested at visit 2 as apporpriate (02/01/2021): completed with some shortness of breath and difficulty with last rep (03/15/2021);    Time 12    Period Weeks    Status Achieved      PT LONG TERM GOAL #5   Title Complete community, work and/or recreational activities without limitation due to current condition.    Baseline limiting ability to complete her usual level of activity, ambulate safely, manage her home alone, and complete  heavy household chores without difficulty (02/01/2021); continues to have limitations in usual activity level, feels at risk of falling when ambulation, has difficulty with heavy household chores (03/15/2021);    Time 12    Period Weeks    Status Partially Met                 Plan - 04/06/21 1622    Clinical Impression Statement Patient tolerated treatment well overall and continues to advance exercise volume and comfort with feeling out of breath. Concentrated on educating patient about resources to continue fitness activities with other people following discharge from PT. Patient would benefit from continued management of limiting condition by skilled physical therapist to address remaining impairments and functional limitations to work towards stated goals and return to PLOF or maximal functional independence.    Personal Factors and Comorbidities  Age;Comorbidity 3+    Comorbidities hypertension, rhythm irregularity, heart valve problem (mointors by her doctor), R shoulder pain, allergic to house dust, type 2 diabetes, CKD, decreased hearing, R shoulder pain, dizziness, cystocele and uterine prolapse, cataract surgery, heartburn.    Examination-Activity Limitations Locomotion Level;Lift;Reach Overhead;Carry;Stairs;Other    Examination-Participation Restrictions Cleaning;Yard Work;Community Activity    Stability/Clinical Decision Making Stable/Uncomplicated    Rehab Potential Good    PT Frequency 2x / week    PT Duration 12 weeks    PT Treatment/Interventions ADLs/Self Care Home Management;Cryotherapy;Moist Heat;Electrical Stimulation;DME Instruction;Functional mobility training;Therapeutic activities;Therapeutic exercise;Balance training;Neuromuscular re-education;Patient/family education;Gait training;Manual techniques;Dry needling;Passive range of motion;Spinal Manipulations;Visual/perceptual remediation/compensation;Joint Manipulations    PT Next Visit Plan balance, strength training, transition to independent HEP or community exercise setting as appropriate    PT Home Exercise Plan Medbridge Access Code: [email or web address]    Consulted and Agree with Plan of Care Patient           Patient will benefit from skilled therapeutic intervention in order to improve the following deficits and impairments:  Dizziness,Pain,Decreased coordination,Decreased mobility,Impaired UE functional use,Impaired perceived functional ability,Decreased strength,Decreased endurance,Decreased activity tolerance,Decreased balance  Visit Diagnosis: Unsteadiness on feet  Muscle weakness (generalized)  Chronic right shoulder pain     Problem List Patient Active Problem List   Diagnosis Date Noted  . Right shoulder pain 10/02/2020  . CKD (chronic kidney disease) stage 3, GFR 30-59 ml/min (HCC) 10/02/2020  . Elevated TSH 10/02/2020  . Weakness 05/29/2020  . Type  2 diabetes mellitus with hyperglycemia ([place]) 01/07/2020  . GERD (gastroesophageal reflux disease) 11/09/2018  . Dizziness 06/02/2017  . Vaginal atrophy 09/27/2016  . SVT (supraventricular tachycardia) ([place]) 01/27/2016  . Headache 01/26/2016  . Left arm numbness 01/26/2016  . Urethral caruncle 12/23/2015  . Vaginal pessary in situ 09/22/2015  . Health care maintenance 07/22/2015  . Baden-Walker grade 4 cystocele 06/26/2015  . First degree uterine prolapse 06/26/2015  . Hyperkalemia 04/27/2015  . Arrhythmia 04/27/2015  . SOB (shortness of breath) 04/27/2015  . Double vision 07/18/2014  . Decreased hearing 07/18/2014  . Stress 07/18/2014  . Fatigue 07/18/2014  . Environmental allergies 07/09/2013  . Hypertension 12/09/2012  . Hypercholesterolemia 12/09/2012    Everlean Alstrom. Graylon Good, PT, DPT 04/06/21, 4:23 PM  [facility] [address] [place], [address] Phone: [phone]   Fax:  [phone]  Name: Elizabeth [name] MRN: [id number] Date of Birth: Dec 25, 1926

## 2021-04-10 ENCOUNTER — Encounter: Payer: Self-pay | Admitting: Internal Medicine

## 2021-04-10 DIAGNOSIS — R440 Auditory hallucinations: Secondary | ICD-10-CM | POA: Insufficient documentation

## 2021-04-10 NOTE — Assessment & Plan Note (Signed)
Avoid antiinflammatories.  Stay hydrated.  Follow metabolic panel.   

## 2021-04-10 NOTE — Assessment & Plan Note (Signed)
Low carb diet and exercise given elevated blood sugar. Follow met b and a1c.   

## 2021-04-10 NOTE — Assessment & Plan Note (Signed)
Followed by cardiology.  Continue lopressor.  Overall stable.  Walking treadmill.  Follow.

## 2021-04-10 NOTE — Assessment & Plan Note (Signed)
Continue lipitor.  Low cholesterol diet and exercise.  Follow lipid panel and liver function tests.   

## 2021-04-10 NOTE — Assessment & Plan Note (Signed)
Increased stress.  Discussed.  Denies depression.  Is lonely.  Does not feel needs any further intervention at this time.  Follow

## 2021-04-10 NOTE — Assessment & Plan Note (Addendum)
Blood pressure doing well on accupril and metoprolol.  Taking name brand ace inhibitor.  Works better for her.  feels bp better controlled.  Follow pressures.  Follow metabolic panel.

## 2021-04-10 NOTE — Assessment & Plan Note (Signed)
Describes auditory and visual hallucinations as outlined.  Discussed possible etiologies.  Mini mental status exam:  29/30.  Discussed further evaluation and w/up.  Agreeable to neurology referral.

## 2021-04-11 ENCOUNTER — Other Ambulatory Visit: Payer: Self-pay

## 2021-04-11 ENCOUNTER — Ambulatory Visit: Payer: Medicare Other | Admitting: Physical Therapy

## 2021-04-11 VITALS — BP 158/74

## 2021-04-11 DIAGNOSIS — R2681 Unsteadiness on feet: Secondary | ICD-10-CM

## 2021-04-11 DIAGNOSIS — G8929 Other chronic pain: Secondary | ICD-10-CM

## 2021-04-11 DIAGNOSIS — M6281 Muscle weakness (generalized): Secondary | ICD-10-CM

## 2021-04-11 NOTE — Therapy (Signed)
[facility] [address], [place], [address] Phone: [phone]   Fax:  [phone]  Physical Therapy Treatment  Patient Details  Name: Elizabeth [name] MRN: [id number] Date of Birth: 04/27/1927 Referring Provider (PT): Einar Pheasant, MD   Encounter Date: 04/11/2021   PT End of Session - 04/11/21 2024    Visit Number 17    Number of Visits 16    Date for PT Re-Evaluation 04/26/21    Authorization Type [employer] Medicare reporting period from 02/15/2021    Progress Note Due on Visit 10    PT Start Time 1117    PT Stop Time 1200    PT Time Calculation (min) 43 min    Equipment Utilized During Treatment Gait belt    Activity Tolerance Patient tolerated treatment well    Behavior During Therapy [facility] for tasks assessed/performed           Past Medical History:  Diagnosis Date  . Cystocele or rectocele with incomplete uterine prolapse   . Hyperlipidemia   . Hypertension   . Irritable bowel syndrome   . Palpitations   . Vaginal atrophy     Past Surgical History:  Procedure Laterality Date  . CATARACT EXTRACTION  2006    Vitals:   04/11/21 1124  BP: (!) 158/74     Subjective Assessment - 04/11/21 1125    Subjective Patient reports she is feeling well today with no pain. States she had a really good day yesterday with her equilibrium and is having a pretty good day today too even with the changes in weather. States her shortness of breath has improved and she mostly only notices it at rest.    Pertinent History Patient is a 87 y.o. female who presents to outpatient physical therapy with a referral for medical diagnosis weakness. This patient's chief complaints consist of feeling like she is losing strength and loses her balance sometimes leading to the following functional deficits: difficulty maintaining her previous activity level and concern about being imbalanced, heavy household chores, etc.   Relevant  past medical history and comorbidities include hypertension, rhythm irregularity, heart valve problem (mointors by her doctor), R shoulder pain, allergic to house dust, type 2 diabetes, CKD, decreased hearing, R shoulder pain, dizziness, cystocele and uterine prolapse, cataract surgery, heartburn. Patient denies hx of cancer, stroke, seizures, lung problem, major cardiac events, diabetes, unexplained weight loss, changes in bowel or bladder problems, new onset stumbling or dropping things apart from described below.    Limitations Lifting;Standing;Walking;House hold activities   difficulty maintaining previous level of activities   Patient Stated Goals to get stronger and have better balance    Currently in Pain? No/denies           TREATMENT:  Therapeutic exercise:to centralize symptoms and improve ROM, strength, muscular endurance, and activity tolerance required for successful completion of functional activities. - vitals check to assess safety of exercise (WFL, see above) - deep squatsto buttocks tap on 12 inch stepwith BUE supporton TRX,3x14  - squats with buttocks tap on 18 inch chair 1x10 - education on HEP including demonstration of options (patient does not appear to want to perform them but voices understanding and has performed them in the past).  - trampoline jumping:2x30 seconds hops with UE support.  - pursed lip deep breathing between exercises, to improve recovery - hopping practice including with deeper knee flexion prior for patient to practice getting off ground as her preference is  to get off ground. Does not like the feeling of not getting off ground.  - "box" jumps to 2 inch A2Zcare pad 1x10 with U UE support. Pateint has difficulty getting off ground this high.  - Education on HEP including handout   Pt required multimodal cuing for proper technique and to facilitate improved neuromuscular control, strength, range of motion, and functional ability resulting in  improved performance and form.CGA - SBA all balance activities.   HOME EXERCISE PROGRAM Access Code: [email or web address] URL: [email or web address] Date: 04/11/2021 Prepared by: Rosita Kea  Exercises Seated Pursed Lip Breathing - 10 reps - 4 counts hold Jumping Jacks - 3 x weekly - 3 sets - 10 reps Squat with Chair Touch - 3 x weekly - 3 sets - 10-15 reps Kettlebell Deadlift - 3 x weekly - 3 sets - 10 reps     PT Education - 04/11/21 2024    Education Details exercise form/technique. community resources    [employer]) Educated Patient    Methods Explanation;Demonstration;Tactile cues;Verbal cues;Handout    Comprehension Verbalized understanding;Returned demonstration;Verbal cues required;Tactile cues required;Need further instruction            PT Short Term Goals - 02/21/21 1608      PT SHORT TERM GOAL #1   Title Be independent with initial home exercise program for self-management of symptoms.    Baseline Initial HEP to be provided visit 2 as appropriate (02/01/2021);    Time 3    Period Weeks    Status Achieved    Target Date 02/22/21             PT Long Term Goals - 03/15/21 2015      PT LONG TERM GOAL #1   Title Be independent with a long-term home exercise program for self-management of symptoms.    Baseline Initial HEP to be provided visit 2 as appropriate (02/01/2021); currently participating in appropriate HEP (03/15/2021);    Time 12    Period Weeks    Status Partially Met   TARGET DATE FOR ALL LONG TERM GOALS: 04/26/2021     PT LONG TERM GOAL #2   Title Demonstrate improved FOTO score by 10 units to demonstrate improvement in overall condition and self-reported functional ability.    Baseline to be tested vist 2 as appropriate (02/01/2021); 85 (02/06/2021); 75 (03/14/2021); 80 (03/15/21);    Time 12    Period Weeks    Status On-going      PT LONG TERM GOAL #3   Title Patient will demonstrate improved FGA to equal or more than 25/30 to demonstrate  improved fall risk to low to improve patient's ability to complete housework and mobility safetly.    Baseline 22/30 (02/01/2021); 25/30 (03/15/2021);    Time 12    Period Weeks    Status Achieved      PT LONG TERM GOAL #4   Title Patient will demonstrate floor to waist lift of equal or greater than 20# x 10 reps with good form to improve her ability to complete heavy housework and lift groceries at home.    Baseline to be tested at visit 2 as apporpriate (02/01/2021): completed with some shortness of breath and difficulty with last rep (03/15/2021);    Time 12    Period Weeks    Status Achieved      PT LONG TERM GOAL #5   Title Complete community, work and/or recreational activities without limitation due to current condition.    Baseline limiting ability to complete  her usual level of activity, ambulate safely, manage her home alone, and complete heavy household chores without difficulty (02/01/2021); continues to have limitations in usual activity level, feels at risk of falling when ambulation, has difficulty with heavy household chores (03/15/2021);    Time 12    Period Weeks    Status Partially Met                 Plan - 04/11/21 2024    Clinical Impression Statement Patient tolerated treatment well overall today. Focused on updating HEP and continuing to work on lower extremity/functional strength/power. Patient continues to be anxious/averse to feeling of shortness of breath and took extra-long rest breaks this session. Continued to encourage improved breathing form. Patient without visible labor of breathing. Patient would benefit from continued management of limiting condition by skilled physical therapist to address remaining impairments and functional limitations to work towards stated goals and return to PLOF or maximal functional independence.    Personal Factors and Comorbidities Age;Comorbidity 3+    Comorbidities hypertension, rhythm irregularity, heart valve problem (mointors  by her doctor), R shoulder pain, allergic to house dust, type 2 diabetes, CKD, decreased hearing, R shoulder pain, dizziness, cystocele and uterine prolapse, cataract surgery, heartburn.    Examination-Activity Limitations Locomotion Level;Lift;Reach Overhead;Carry;Stairs;Other    Examination-Participation Restrictions Cleaning;Yard Work;Community Activity    Stability/Clinical Decision Making Stable/Uncomplicated    Rehab Potential Good    PT Frequency 2x / week    PT Duration 12 weeks    PT Treatment/Interventions ADLs/Self Care Home Management;Cryotherapy;Moist Heat;Electrical Stimulation;DME Instruction;Functional mobility training;Therapeutic activities;Therapeutic exercise;Balance training;Neuromuscular re-education;Patient/family education;Gait training;Manual techniques;Dry needling;Passive range of motion;Spinal Manipulations;Visual/perceptual remediation/compensation;Joint Manipulations    PT Next Visit Plan balance, strength training, transition to independent HEP or community exercise setting as appropriate    PT Home Exercise Plan Medbridge Access Code: [email or web address]    Consulted and Agree with Plan of Care Patient           Patient will benefit from skilled therapeutic intervention in order to improve the following deficits and impairments:  Dizziness,Pain,Decreased coordination,Decreased mobility,Impaired UE functional use,Impaired perceived functional ability,Decreased strength,Decreased endurance,Decreased activity tolerance,Decreased balance  Visit Diagnosis: Unsteadiness on feet  Muscle weakness (generalized)  Chronic right shoulder pain     Problem List Patient Active Problem List   Diagnosis Date Noted  . Auditory hallucinations 04/10/2021  . Right shoulder pain 10/02/2020  . CKD (chronic kidney disease) stage 3, GFR 30-59 ml/min (HCC) 10/02/2020  . Elevated TSH 10/02/2020  . Weakness 05/29/2020  . Type 2 diabetes mellitus with hyperglycemia ([place]) 01/07/2020  .  GERD (gastroesophageal reflux disease) 11/09/2018  . Dizziness 06/02/2017  . Vaginal atrophy 09/27/2016  . SVT (supraventricular tachycardia) ([place]) 01/27/2016  . Headache 01/26/2016  . Left arm numbness 01/26/2016  . Urethral caruncle 12/23/2015  . Vaginal pessary in situ 09/22/2015  . Health care maintenance 07/22/2015  . Baden-Walker grade 4 cystocele 06/26/2015  . First degree uterine prolapse 06/26/2015  . Hyperkalemia 04/27/2015  . Arrhythmia 04/27/2015  . SOB (shortness of breath) 04/27/2015  . Double vision 07/18/2014  . Decreased hearing 07/18/2014  . Stress 07/18/2014  . Fatigue 07/18/2014  . Environmental allergies 07/09/2013  . Hypertension 12/09/2012  . Hypercholesterolemia 12/09/2012    Everlean Alstrom. Graylon Good, PT, DPT 04/11/21, 8:26 PM  [place] [facility] [address] [place], [address] Phone: [phone]   Fax:  [phone]  Name: Elizabeth [name] MRN: [id number] Date of Birth: 04/07/1927

## 2021-04-13 ENCOUNTER — Other Ambulatory Visit: Payer: Self-pay

## 2021-04-13 ENCOUNTER — Ambulatory Visit: Payer: Medicare Other | Admitting: Physical Therapy

## 2021-04-13 ENCOUNTER — Encounter: Payer: Self-pay | Admitting: Physical Therapy

## 2021-04-13 DIAGNOSIS — R2681 Unsteadiness on feet: Secondary | ICD-10-CM

## 2021-04-13 DIAGNOSIS — G8929 Other chronic pain: Secondary | ICD-10-CM

## 2021-04-13 DIAGNOSIS — M6281 Muscle weakness (generalized): Secondary | ICD-10-CM

## 2021-04-13 NOTE — Therapy (Signed)
[place] [facility] [address], [place], [address] Phone: [phone]   Fax:  [phone]  Physical Therapy Treatment  Patient Details  Name: Elizabeth [name] MRN: [id number] Date of Birth: [birth year] Referring Provider (PT): Einar Pheasant, MD   Encounter Date: 04/13/2021   PT End of Session - 04/13/21 1159    Visit Number 18    Number of Visits 24    Date for PT Re-Evaluation 04/26/21    Authorization Type [employer] Medicare reporting period from 02/15/2021    Progress Note Due on Visit 10    PT Start Time 1035    PT Stop Time 1115    PT Time Calculation (min) 40 min    Equipment Utilized During Treatment Gait belt    Activity Tolerance Patient tolerated treatment well    Behavior During Therapy [facility] for tasks assessed/performed           Past Medical History:  Diagnosis Date  . Cystocele or rectocele with incomplete uterine prolapse   . Hyperlipidemia   . Hypertension   . Irritable bowel syndrome   . Palpitations   . Vaginal atrophy     Past Surgical History:  Procedure Laterality Date  . CATARACT EXTRACTION  2006    There were no vitals filed for this visit.   Subjective Assessment - 04/13/21 1039    Subjective Patient reports she is having some GI upset today after having dinner with her grandson last night where she usually prepares different food than the bland food she eats on other days. She would prefer not to do any jumping today and is not sure how much she can do today. No pain. Felt okay after last session.    Pertinent History Patient is a 87 y.o. female who presents to outpatient physical therapy with a referral for medical diagnosis weakness. This patient's chief complaints consist of feeling like she is losing strength and loses her balance sometimes leading to the following functional deficits: difficulty maintaining her previous activity level and concern about being imbalanced, heavy  household chores, etc.   Relevant past medical history and comorbidities include hypertension, rhythm irregularity, heart valve problem (mointors by her doctor), R shoulder pain, allergic to house dust, type 2 diabetes, CKD, decreased hearing, R shoulder pain, dizziness, cystocele and uterine prolapse, cataract surgery, heartburn. Patient denies hx of cancer, stroke, seizures, lung problem, major cardiac events, diabetes, unexplained weight loss, changes in bowel or bladder problems, new onset stumbling or dropping things apart from described below.    Limitations Lifting;Standing;Walking;House hold activities   difficulty maintaining previous level of activities   Patient Stated Goals to get stronger and have better balance    Currently in Pain? No/denies           TREATMENT:  Therapeutic exercise:to centralize symptoms and improve ROM, strength, muscular endurance, and activity tolerance required for successful completion of functional activities. - pursed lip deep breathing between exercises, to improve recover - squats with buttocks tap on 18 inch chair 3x10, hands held in front - RDL with 20# in pillow case (pulling on back)/20#KB/30#KB, 3x10 (discomfort in back with pillowcase but not others, 30# felt very heavy)  Neuromuscular Re-education:to improve, balance, postural strength, muscle activation patterns, and stabilization strength required for functional activities (SBA - CGA for safety). - step up/step down forward leading with same leg over large dynadisc, 1x20 each side. CGA. - agility ladder drills (CGA): forward/backwards in/out of each square 2x9 squares  each direction. Side to side across each square, 4x9 squares. Starting at self selected pace progressing to as fast as possible or to quick beat of music.   Pt required multimodal cuing for proper technique and to facilitate improved neuromuscular control, strength, range of motion, and functional ability resulting in  improved performance and form.CGA - SBA all balance activities.   HOME EXERCISE PROGRAM Access Code: [email or web address] URL: [email or web address] Date: 04/11/2021 Prepared by: Rosita Kea  Exercises Seated Pursed Lip Breathing - 10 reps - 4 counts hold Jumping Jacks - 3 x weekly - 3 sets - 10 reps Squat with Chair Touch - 3 x weekly - 3 sets - 10-15 reps Kettlebell Deadlift - 3 x weekly - 3 sets - 10 reps   PT Education - 04/13/21 1159    Education Details exercise form/technique. HEP    Person(s) Educated Patient    Methods Explanation;Demonstration;Tactile cues;Verbal cues    Comprehension Verbalized understanding;Returned demonstration;Verbal cues required;Tactile cues required;Need further instruction            PT Short Term Goals - 02/21/21 1608      PT SHORT TERM GOAL #1   Title Be independent with initial home exercise program for self-management of symptoms.    Baseline Initial HEP to be provided visit 2 as appropriate (02/01/2021);    Time 3    Period Weeks    Status Achieved    Target Date 02/22/21             PT Long Term Goals - 03/15/21 2015      PT LONG TERM GOAL #1   Title Be independent with a long-term home exercise program for self-management of symptoms.    Baseline Initial HEP to be provided visit 2 as appropriate (02/01/2021); currently participating in appropriate HEP (03/15/2021);    Time 12    Period Weeks    Status Partially Met   TARGET DATE FOR ALL LONG TERM GOALS: 04/26/2021     PT LONG TERM GOAL #2   Title Demonstrate improved FOTO score by 10 units to demonstrate improvement in overall condition and self-reported functional ability.    Baseline to be tested vist 2 as appropriate (02/01/2021); 85 (02/06/2021); 75 (03/14/2021); 80 (03/15/21);    Time 12    Period Weeks    Status On-going      PT LONG TERM GOAL #3   Title Patient will demonstrate improved FGA to equal or more than 25/30 to demonstrate improved fall risk to low to  improve patient's ability to complete housework and mobility safetly.    Baseline 22/30 (02/01/2021); 25/30 (03/15/2021);    Time 12    Period Weeks    Status Achieved      PT LONG TERM GOAL #4   Title Patient will demonstrate floor to waist lift of equal or greater than 20# x 10 reps with good form to improve her ability to complete heavy housework and lift groceries at home.    Baseline to be tested at visit 2 as apporpriate (02/01/2021): completed with some shortness of breath and difficulty with last rep (03/15/2021);    Time 12    Period Weeks    Status Achieved      PT LONG TERM GOAL #5   Title Complete community, work and/or recreational activities without limitation due to current condition.    Baseline limiting ability to complete her usual level of activity, ambulate safely, manage her home alone, and complete heavy household chores without difficulty (02/01/2021);  continues to have limitations in usual activity level, feels at risk of falling when ambulation, has difficulty with heavy household chores (03/15/2021);    Time 12    Period Weeks    Status Partially Met                 Plan - 04/13/21 1158    Clinical Impression Statement Patient tolerated treatment well overall with no increase in pain by end of session. Was challenged by squats and dead lifts and required seated/standing rest breaks with encouragement for improved breathing technique for cardiovascular recovery from more demanding tasks. Avoided jumping/hopping per pt request due to concern for GI upset (which actually seemed to improve over session). Patient provided with CGA for balance activities for safety but did not need physical assistance to maintain balance. Demo use of ankle, hip, and step strategy to keep balance. Reinforced exercises provided in HEP to improve participation in more challenging exercises at home for long term improvement and funcitonal ability. Patient would benefit from continued management  of limiting condition by skilled physical therapist to address remaining impairments and functional limitations to work towards stated goals and return to PLOF or maximal functional independence.    Personal Factors and Comorbidities Age;Comorbidity 3+    Comorbidities hypertension, rhythm irregularity, heart valve problem (mointors by her doctor), R shoulder pain, allergic to house dust, type 2 diabetes, CKD, decreased hearing, R shoulder pain, dizziness, cystocele and uterine prolapse, cataract surgery, heartburn.    Examination-Activity Limitations Locomotion Level;Lift;Reach Overhead;Carry;Stairs;Other    Examination-Participation Restrictions Cleaning;Yard Work;Community Activity    Stability/Clinical Decision Making Stable/Uncomplicated    Rehab Potential Good    PT Frequency 2x / week    PT Duration 12 weeks    PT Treatment/Interventions ADLs/Self Care Home Management;Cryotherapy;Moist Heat;Electrical Stimulation;DME Instruction;Functional mobility training;Therapeutic activities;Therapeutic exercise;Balance training;Neuromuscular re-education;Patient/family education;Gait training;Manual techniques;Dry needling;Passive range of motion;Spinal Manipulations;Visual/perceptual remediation/compensation;Joint Manipulations    PT Next Visit Plan balance, strength training, transition to independent HEP or community exercise setting as appropriate    PT Home Exercise Plan Medbridge Access Code: [email or web address]    Consulted and Agree with Plan of Care Patient           Patient will benefit from skilled therapeutic intervention in order to improve the following deficits and impairments:  Dizziness,Pain,Decreased coordination,Decreased mobility,Impaired UE functional use,Impaired perceived functional ability,Decreased strength,Decreased endurance,Decreased activity tolerance,Decreased balance  Visit Diagnosis: Unsteadiness on feet  Muscle weakness (generalized)  Chronic right shoulder  pain     Problem List Patient Active Problem List   Diagnosis Date Noted  . Auditory hallucinations 04/10/2021  . Right shoulder pain 10/02/2020  . CKD (chronic kidney disease) stage 3, GFR 30-59 ml/min (HCC) 10/02/2020  . Elevated TSH 10/02/2020  . Weakness 05/29/2020  . Type 2 diabetes mellitus with hyperglycemia ([place]) 01/07/2020  . GERD (gastroesophageal reflux disease) 11/09/2018  . Dizziness 06/02/2017  . Vaginal atrophy 09/27/2016  . SVT (supraventricular tachycardia) ([place]) 01/27/2016  . Headache 01/26/2016  . Left arm numbness 01/26/2016  . Urethral caruncle 12/23/2015  . Vaginal pessary in situ 09/22/2015  . Health care maintenance 07/22/2015  . Baden-Walker grade 4 cystocele 06/26/2015  . First degree uterine prolapse 06/26/2015  . Hyperkalemia 04/27/2015  . Arrhythmia 04/27/2015  . SOB (shortness of breath) 04/27/2015  . Double vision 07/18/2014  . Decreased hearing 07/18/2014  . Stress 07/18/2014  . Fatigue 07/18/2014  . Environmental allergies 07/09/2013  . Hypertension 12/09/2012  . Hypercholesterolemia 12/09/2012    Everlean Alstrom. Graylon Good, PT, DPT  04/13/21, 12:01 PM  [place] [facility] [address] [place], [address] Phone: [phone]   Fax:  [phone]  Name: DENYLA CORTESE MRN: [id number] Date of Birth: [birth year]

## 2021-04-18 ENCOUNTER — Ambulatory Visit: Payer: Medicare Other

## 2021-04-18 ENCOUNTER — Other Ambulatory Visit: Payer: Self-pay

## 2021-04-18 VITALS — BP 167/56 | HR 62

## 2021-04-18 DIAGNOSIS — R2681 Unsteadiness on feet: Secondary | ICD-10-CM

## 2021-04-18 DIAGNOSIS — M6281 Muscle weakness (generalized): Secondary | ICD-10-CM

## 2021-04-18 DIAGNOSIS — G8929 Other chronic pain: Secondary | ICD-10-CM

## 2021-04-18 NOTE — Therapy (Signed)
[facility] [address] [place], [address] Phone: [phone]   Fax:  [phone]  Physical Therapy Treatment  Patient Details  Name: Elizabeth [name] MRN: [id number] Date of Birth: May 06, 1927 Referring Provider (PT): Einar Pheasant, MD   Encounter Date: 04/18/2021   PT End of Session - 04/18/21 1130    Visit Number 19    Number of Visits 24    Date for PT Re-Evaluation 04/26/21    Authorization Type [employer] Medicare reporting period from 02/15/2021    PT Start Time 1121    PT Stop Time 1200    PT Time Calculation (min) 39 min    Activity Tolerance Patient tolerated treatment well    Behavior During Therapy [facility] for tasks assessed/performed           Past Medical History:  Diagnosis Date  . Cystocele or rectocele with incomplete uterine prolapse   . Hyperlipidemia   . Hypertension   . Irritable bowel syndrome   . Palpitations   . Vaginal atrophy     Past Surgical History:  Procedure Laterality Date  . CATARACT EXTRACTION  2006    Vitals:   04/18/21 1128  BP: (!) 167/56  Pulse: 62     Subjective Assessment - 04/18/21 1129    Subjective Pt doing well today. Saw neurology last week to become an established patient for potential musical hallucinations. No other updates.    Pertinent History Patient is a 87 y.o. female who presents to outpatient physical therapy with a referral for medical diagnosis weakness. This patient's chief complaints consist of feeling like she is losing strength and loses her balance sometimes leading to the following functional deficits: difficulty maintaining her previous activity level and concern about being imbalanced, heavy household chores, etc.   Relevant past medical history and comorbidities include hypertension, rhythm irregularity, heart valve problem (mointors by her doctor), R shoulder pain, allergic to house dust, type 2 diabetes, CKD, decreased hearing, R shoulder  pain, dizziness, cystocele and uterine prolapse, cataract surgery, heartburn. Patient denies hx of cancer, stroke, seizures, lung problem, major cardiac events, diabetes, unexplained weight loss, changes in bowel or bladder problems, new onset stumbling or dropping things apart from described below.    Currently in Pain? No/denies                PT Short Term Goals - 02/21/21 1608      PT SHORT TERM GOAL #1   Title Be independent with initial home exercise program for self-management of symptoms.    Baseline Initial HEP to be provided visit 2 as appropriate (02/01/2021);    Time 3    Period Weeks    Status Achieved    Target Date 02/22/21             PT Long Term Goals - 04/18/21 1135      PT LONG TERM GOAL #1   Title Be independent with a long-term home exercise program for self-management of symptoms.    Baseline Initial HEP to be provided visit 2 as appropriate (02/01/2021); currently participating in appropriate HEP (03/15/2021);    Time 12    Period Weeks    Status Partially Met      PT LONG TERM GOAL #2   Title Demonstrate improved FOTO score by 10 units to demonstrate improvement in overall condition and self-reported functional ability.    Baseline to be tested vist 2 as appropriate (02/01/2021); 85 (02/06/2021); 75 (  03/14/2021); 80 (03/15/21); 75 (03/29/21); 81 (04/18/21)    Time 12    Period Weeks    Status On-going      PT LONG TERM GOAL #3   Title Patient will demonstrate improved FGA to equal or more than 25/30 to demonstrate improved fall risk to low to improve patient's ability to complete housework and mobility safetly.    Baseline 22/30 (02/01/2021); 25/30 (03/15/2021);    Time 12    Period Weeks    Status Achieved      PT LONG TERM GOAL #4   Title Patient will demonstrate floor to waist lift of equal or greater than 20# x 10 reps with good form to improve her ability to complete heavy housework and lift groceries at home.    Baseline to be tested at visit 2 as  apporpriate (02/01/2021): completed with some shortness of breath and difficulty with last rep (03/15/2021);    Time 12    Period Weeks    Status Achieved      PT LONG TERM GOAL #5   Title Complete community, work and/or recreational activities without limitation due to current condition.    Baseline limiting ability to complete her usual level of activity, ambulate safely, manage her home alone, and complete heavy household chores without difficulty (02/01/2021); continues to have limitations in usual activity level, feels at risk of falling when ambulation, has difficulty with heavy household chores (03/15/2021);    Time 12    Period Weeks    Status Partially Met            TREATMENT:    Therapeutic exercise: to centralize symptoms and improve ROM, strength, muscular endurance, and activity tolerance required for successful completion of functional activities.  - vitals check to assess safety of exercise (WFL, see above) - deep squats to buttocks tap on 12 inch step with BUE support on TRX, 3x15 - squats with buttocks tap on 18 inch chair 1x10, hands free  - Walking figure 8s c Contact Guard Assist: 3 laps, 3 laps with bilat 3lb free weights, 3 laps c 3lb AW (no LOB)  - trampoline jumping:2x30 seconds hops with UE support, supervision level assistance -Forward box jump onto 1" foam pad 1x12, minGuard assist, confidence makes difficult obtaining a double leg versus sequenced movement forward (50% success rate)   Pt required multimodal cuing for proper technique and to facilitate improved neuromuscular control, strength, range of motion, and functional ability resulting in improved performance and form. CGA - SBA all balance activities.       HOME EXERCISE PROGRAM Access Code: [email or web address] URL: [email or web address] Date: 04/11/2021 Prepared by: Rosita Kea   Exercises Seated Pursed Lip Breathing - 10 reps - 4 counts hold Jumping Jacks - 3 x weekly - 3 sets - 10 reps Squat  with Chair Touch - 3 x weekly - 3 sets - 10-15 reps Kettlebell Deadlift - 3 x weekly - 3 sets - 10 reps      Plan - 04/18/21 1131    Clinical Impression Statement Continued with current plan of care as laid out in evaluation and recent prior sessions. Pt remains motivated to advance progress toward goals. FOTO survey completed prior to arrival, shows no significant improvement. Rest breaks provided as needed, pt quick to ask when needed. Pt does require varying levels of assistance and cuing for completion of exercises for correct form and sometimes due to pain/weakness. Pt closely monitored throughout session for safe vitals response and to maximize patient safety  during interventions. Pt continues to demonstrate progress toward goals AEB progression of some interventions this date either in volume or intensity.   Personal Factors and Comorbidities Age;Comorbidity 3+    Comorbidities hypertension, rhythm irregularity, heart valve problem (mointors by her doctor), R shoulder pain, allergic to house dust, type 2 diabetes, CKD, decreased hearing, R shoulder pain, dizziness, cystocele and uterine prolapse, cataract surgery, heartburn.    Examination-Activity Limitations Locomotion Level;Lift;Reach Overhead;Carry;Stairs;Other    Examination-Participation Restrictions Cleaning;Yard Work;Community Activity    Stability/Clinical Decision Making Stable/Uncomplicated    Clinical Decision Making Low    Rehab Potential Good    PT Frequency 2x / week    PT Duration 12 weeks    PT Treatment/Interventions ADLs/Self Care Home Management;Cryotherapy;Moist Heat;Electrical Stimulation;DME Instruction;Functional mobility training;Therapeutic activities;Therapeutic exercise;Balance training;Neuromuscular re-education;Patient/family education;Gait training;Manual techniques;Dry needling;Passive range of motion;Spinal Manipulations;Visual/perceptual remediation/compensation;Joint Manipulations    PT Next Visit Plan  balance, strength training, transition to independent HEP or community exercise setting as appropriate    PT Home Exercise Plan Medbridge Access Code: [email or web address]    Consulted and Agree with Plan of Care Patient           Patient will benefit from skilled therapeutic intervention in order to improve the following deficits and impairments:  Dizziness,Pain,Decreased coordination,Decreased mobility,Impaired UE functional use,Impaired perceived functional ability,Decreased strength,Decreased endurance,Decreased activity tolerance,Decreased balance  Visit Diagnosis: Unsteadiness on feet  Muscle weakness (generalized)  Chronic right shoulder pain     Problem List Patient Active Problem List   Diagnosis Date Noted  . Auditory hallucinations 04/10/2021  . Right shoulder pain 10/02/2020  . CKD (chronic kidney disease) stage 3, GFR 30-59 ml/min (HCC) 10/02/2020  . Elevated TSH 10/02/2020  . Weakness 05/29/2020  . Type 2 diabetes mellitus with hyperglycemia ([place]) 01/07/2020  . GERD (gastroesophageal reflux disease) 11/09/2018  . Dizziness 06/02/2017  . Vaginal atrophy 09/27/2016  . SVT (supraventricular tachycardia) ([place]) 01/27/2016  . Headache 01/26/2016  . Left arm numbness 01/26/2016  . Urethral caruncle 12/23/2015  . Vaginal pessary in situ 09/22/2015  . Health care maintenance 07/22/2015  . Baden-Walker grade 4 cystocele 06/26/2015  . First degree uterine prolapse 06/26/2015  . Hyperkalemia 04/27/2015  . Arrhythmia 04/27/2015  . SOB (shortness of breath) 04/27/2015  . Double vision 07/18/2014  . Decreased hearing 07/18/2014  . Stress 07/18/2014  . Fatigue 07/18/2014  . Environmental allergies 07/09/2013  . Hypertension 12/09/2012  . Hypercholesterolemia 12/09/2012   12:03 PM, 04/18/21 Etta Grandchild, PT, DPT Physical Therapist - [facility] [phone] (Office)    Terresa Marlett C 04/18/2021, 11:38 AM  [facility] [address] [place], [address] Phone: [phone]   Fax:  [phone]  Name: Elizabeth [name] MRN: [id number] Date of Birth: 1927/12/11

## 2021-04-19 ENCOUNTER — Other Ambulatory Visit: Payer: Self-pay | Admitting: Neurology

## 2021-04-19 DIAGNOSIS — R440 Auditory hallucinations: Secondary | ICD-10-CM

## 2021-04-20 ENCOUNTER — Ambulatory Visit: Payer: Medicare Other | Admitting: Physical Therapy

## 2021-04-20 ENCOUNTER — Other Ambulatory Visit: Payer: Self-pay

## 2021-04-20 ENCOUNTER — Encounter: Payer: Self-pay | Admitting: Physical Therapy

## 2021-04-20 VITALS — BP 148/70 | HR 67

## 2021-04-20 DIAGNOSIS — G8929 Other chronic pain: Secondary | ICD-10-CM

## 2021-04-20 DIAGNOSIS — R2681 Unsteadiness on feet: Secondary | ICD-10-CM

## 2021-04-20 DIAGNOSIS — M6281 Muscle weakness (generalized): Secondary | ICD-10-CM

## 2021-04-20 DIAGNOSIS — M25511 Pain in right shoulder: Secondary | ICD-10-CM

## 2021-04-20 NOTE — Therapy (Signed)
[facility] [address] [place], [address] Phone: [phone]   Fax:  [phone]  Physical Therapy Treatment / Discharge Summary Dates of reporting: 02/01/2021 - 04/20/2021  Patient Details  Name: Elizabeth [name] MRN: [id number] Date of Birth: 11/23/1927 Referring Provider (PT): Einar Pheasant, MD   Encounter Date: 04/20/2021   PT End of Session - 04/20/21 1448    Visit Number 20    Number of Visits 24    Date for PT Re-Evaluation 04/26/21    Authorization Type UHC Medicare reporting period from 02/15/2021    PT Start Time 1120    PT Stop Time 1200    PT Time Calculation (min) 40 min    Activity Tolerance Patient tolerated treatment well    Behavior During Therapy [facility] for tasks assessed/performed           Past Medical History:  Diagnosis Date  . Cystocele or rectocele with incomplete uterine prolapse   . Hyperlipidemia   . Hypertension   . Irritable bowel syndrome   . Palpitations   . Vaginal atrophy     Past Surgical History:  Procedure Laterality Date  . CATARACT EXTRACTION  2006    Vitals:   04/20/21 1126  BP: (!) 148/70  Pulse: 67  SpO2: 98%     Subjective Assessment - 04/20/21 1126    Subjective Patient states she is feeling well. She forgot her hearing aides this morning. She has a brain MRI next week. State she feels ambivalent about whether to graduate from PT today or in a week.    Pertinent History Patient is a 87 y.o. female who presents to outpatient physical therapy with a referral for medical diagnosis weakness. This patient's chief complaints consist of feeling like she is losing strength and loses her balance sometimes leading to the following functional deficits: difficulty maintaining her previous activity level and concern about being imbalanced, heavy household chores, etc.   Relevant past medical history and comorbidities include hypertension, rhythm irregularity, heart  valve problem (mointors by her doctor), R shoulder pain, allergic to house dust, type 2 diabetes, CKD, decreased hearing, R shoulder pain, dizziness, cystocele and uterine prolapse, cataract surgery, heartburn. Patient denies hx of cancer, stroke, seizures, lung problem, major cardiac events, diabetes, unexplained weight loss, changes in bowel or bladder problems, new onset stumbling or dropping things apart from described below.    Currently in Pain? No/denies             [facility] PT Assessment - 04/20/21 1129      Assessment   Medical Diagnosis weakness    Referring Provider (PT) Einar Pheasant, MD    Onset Date/Surgical Date --   over the last 3 years   Hand Dominance Right    Prior Therapy none for this problem prior to current episode of care      Precautions   Precautions [place] residence    Living Arrangements Alone    Type of [place] Access Level entry    Home Layout One level      Prior Function   Level of [place]   Overall Cognitive Status Within Functional Limits for tasks assessed      Observation/Other Assessments   Focus on Therapeutic Outcomes (FOTO)  81      Functional Gait  Assessment  Gait assessed  Yes    Gait Level Surface Walks 20 ft in less than 5.5 sec, no assistive devices, good speed, no evidence for imbalance, normal gait pattern, deviates no more than 6 in outside of the 12 in walkway width.   4 seconds   Change in Gait Speed Able to smoothly change walking speed without loss of balance or gait deviation. Deviate no more than 6 in outside of the 12 in walkway width.    Gait with Horizontal Head Turns Performs head turns smoothly with no change in gait. Deviates no more than 6 in outside 12 in walkway width    Gait with Vertical Head Turns Performs head turns with no change in gait. Deviates no more than 6 in outside 12 in walkway width.    Gait and Pivot  Turn Pivot turns safely within 3 sec and stops quickly with no loss of balance.    Step Over Obstacle Is able to step over 2 stacked shoe boxes taped together (9 in total height) without changing gait speed. No evidence of imbalance.    Gait with Narrow Base of Support Ambulates 7-9 steps.    Gait with Eyes Closed Walks 20 ft, slow speed, abnormal gait pattern, evidence for imbalance, deviates 10-15 in outside 12 in walkway width. Requires more than 9 sec to ambulate 20 ft.    Ambulating Backwards Walks 20 ft, no assistive devices, good speed, no evidence for imbalance, normal gait    Steps Alternating feet, no rail.   19-24 = medium risk fall   Total Score 27    FGA comment: Interpretation: 25-28 = low risk fall            OBJECTIVE FOTO = 81 ABC = 81.7%   FUNCTIONAL/BALANCE TESTING FGA: 25/30 (see above) Floor to waist lift 30# 1x10   TREATMENT:  Therapeutic exercise:to centralize symptoms and improve ROM, strength, muscular endurance, and activity tolerance required for successful completion of functional activities. - vitals check to assess safety of exercise (WFL, see above) - FGA to assess progress (see above).  - RDL with 30# KB, 2x10 (pull in right back last rep of 2nd set, resolved with rest and some extension and rotation) - squats with buttocks tap on 18 inch chair 2x10, hands free  - jumping jacks 2x10 - seated pursed lip breathing 2x10.  - Education on HEP including handout   CGA - SBA all balance activities.   HOME EXERCISE PROGRAM Access Code: [email or web address] URL: [email or web address] Date: 04/20/2021 Prepared by: Rosita Kea  Exercises Seated Pursed Lip Breathing - 10 reps - 4 counts hold Squat with Chair Touch - 3 x weekly - 3 sets - 10-15 reps Jumping Jacks - 3 x weekly - 3 sets - 10 reps Kettlebell Deadlift - 3 x weekly - 3 sets - 10 reps    PT Education - 04/20/21 1448    Education Details exercise form/technique. HEP. discharge  reccomendations.    Person(s) Educated Patient    Methods Explanation;Demonstration;Tactile cues;Verbal cues;Handout    Comprehension Returned demonstration;Verbal cues required;Verbalized understanding;Tactile cues required            PT Short Term Goals - 02/21/21 1608      PT SHORT TERM GOAL #1   Title Be independent with initial home exercise program for self-management of symptoms.    Baseline Initial HEP to be provided visit 2 as appropriate (02/01/2021);    Time 3    Period Weeks    Status Achieved  Target Date 02/22/21             PT Long Term Goals - 04/20/21 1456      PT LONG TERM GOAL #1   Title Be independent with a long-term home exercise program for self-management of symptoms.    Baseline Initial HEP to be provided visit 2 as appropriate (02/01/2021); currently participating in appropriate HEP (03/15/2021);    Time 12    Period Weeks    Status Achieved      PT LONG TERM GOAL #2   Title Demonstrate improved FOTO score by 10 units to demonstrate improvement in overall condition and self-reported functional ability.    Baseline to be tested vist 2 as appropriate (02/01/2021); 85 (02/06/2021); 75 (03/14/2021); 80 (03/15/21); 75 (03/29/21); 81 (04/18/21)    Time 12    Period Weeks    Status Not Met      PT LONG TERM GOAL #3   Title Patient will demonstrate improved FGA to equal or more than 25/30 to demonstrate improved fall risk to low to improve patient's ability to complete housework and mobility safetly.    Baseline 22/30 (02/01/2021); 25/30 (03/15/2021); 27/30 (04/20/2021);    Time 12    Period Weeks    Status Achieved      PT LONG TERM GOAL #4   Title Patient will demonstrate floor to waist lift of equal or greater than 20# x 10 reps with good form to improve her ability to complete heavy housework and lift groceries at home.    Baseline to be tested at visit 2 as apporpriate (02/01/2021): completed with some shortness of breath and difficulty with last rep  (03/15/2021);    Time 12    Period Weeks    Status Achieved      PT LONG TERM GOAL #5   Title Complete community, work and/or recreational activities without limitation due to current condition.    Baseline limiting ability to complete her usual level of activity, ambulate safely, manage her home alone, and complete heavy household chores without difficulty (02/01/2021); continues to have limitations in usual activity level, feels at risk of falling when ambulation, has difficulty with heavy household chores (03/15/2021);    Time 12    Period Weeks    Status Achieved                 Plan - 04/20/21 1455    Clinical Impression Statement Patient has attended 20 physical therapy sessions this episode of care and has made good progress towards most goals and appears functionally ready for discharge this session. Patient's FOTO score has not improved (was predicted to worsen) and patient continues to express concern about its validity. ABC score has improved from 25% to 81.7% which reflects significant improvement in self-reported confidence in balance. She scores 27/30 on Functional Balance Assessment, which falls into low fall risk category. She demonstrates the ability to lift 30# floor to waist x 10 reps in a row, exceeding her goal of performing this with 20#. She still has some deficits associated with chronic vestibular disorder, difficulty jumping off of the floor, and quick to feel shortness of breath. Breathing techniques have been taught and utilized throughout episode of care and patient has reported decreased distress from feeling short of breath. She has been provided with appropriate long term HEP and encouraged to participate in community activity or personal training as desired for continued wellness. She is now discharged from physical therapy due to improvement in condition.  Personal Factors and Comorbidities Age;Comorbidity 3+    Comorbidities hypertension, rhythm irregularity,  heart valve problem (mointors by her doctor), R shoulder pain, allergic to house dust, type 2 diabetes, CKD, decreased hearing, R shoulder pain, dizziness, cystocele and uterine prolapse, cataract surgery, heartburn.    Examination-Activity Limitations Locomotion Level;Lift;Reach Overhead;Carry;Stairs;Other    Examination-Participation Restrictions Cleaning;Yard Work;Community Activity    Stability/Clinical Decision Making Stable/Uncomplicated    Rehab Potential Good    PT Frequency 2x / week    PT Duration 12 weeks    PT Treatment/Interventions ADLs/Self Care Home Management;Cryotherapy;Moist Heat;Electrical Stimulation;DME Instruction;Functional mobility training;Therapeutic activities;Therapeutic exercise;Balance training;Neuromuscular re-education;Patient/family education;Gait training;Manual techniques;Dry needling;Passive range of motion;Spinal Manipulations;Visual/perceptual remediation/compensation;Joint Manipulations    PT Next Visit Plan now discharged from physical therapy due to improvement in condition.    PT Home Exercise Plan Medbridge Access Code: [email or web address]    Consulted and Agree with Plan of Care Patient           Patient will benefit from skilled therapeutic intervention in order to improve the following deficits and impairments:  Dizziness,Pain,Decreased coordination,Decreased mobility,Impaired UE functional use,Impaired perceived functional ability,Decreased strength,Decreased endurance,Decreased activity tolerance,Decreased balance  Visit Diagnosis: Unsteadiness on feet  Muscle weakness (generalized)  Chronic right shoulder pain     Problem List Patient Active Problem List   Diagnosis Date Noted  . Auditory hallucinations 04/10/2021  . Right shoulder pain 10/02/2020  . CKD (chronic kidney disease) stage 3, GFR 30-59 ml/min (HCC) 10/02/2020  . Elevated TSH 10/02/2020  . Weakness 05/29/2020  . Type 2 diabetes mellitus with hyperglycemia ([place]) 01/07/2020  . GERD  (gastroesophageal reflux disease) 11/09/2018  . Dizziness 06/02/2017  . Vaginal atrophy 09/27/2016  . SVT (supraventricular tachycardia) ([place]) 01/27/2016  . Headache 01/26/2016  . Left arm numbness 01/26/2016  . Urethral caruncle 12/23/2015  . Vaginal pessary in situ 09/22/2015  . Health care maintenance 07/22/2015  . Baden-Walker grade 4 cystocele 06/26/2015  . First degree uterine prolapse 06/26/2015  . Hyperkalemia 04/27/2015  . Arrhythmia 04/27/2015  . SOB (shortness of breath) 04/27/2015  . Double vision 07/18/2014  . Decreased hearing 07/18/2014  . Stress 07/18/2014  . Fatigue 07/18/2014  . Environmental allergies 07/09/2013  . Hypertension 12/09/2012  . Hypercholesterolemia 12/09/2012   Everlean Alstrom. Graylon Good, PT, DPT 04/20/21, 2:57 PM  [facility] [address], [place], [address] Phone: [phone]   Fax:  [phone]  Name: Elizabeth [name] MRN: [id number] Date of Birth: 1927-02-14

## 2021-05-01 ENCOUNTER — Other Ambulatory Visit: Payer: Self-pay

## 2021-05-01 ENCOUNTER — Ambulatory Visit
Admission: RE | Admit: 2021-05-01 | Discharge: 2021-05-01 | Disposition: A | Discharge status: Home / Self Care | Payer: Medicare Other | Source: Ambulatory Visit | Attending: Neurology | Admitting: Neurology

## 2021-05-01 DIAGNOSIS — R440 Auditory hallucinations: Secondary | ICD-10-CM | POA: Diagnosis not present

## 2021-05-23 ENCOUNTER — Other Ambulatory Visit: Payer: Medicare Other

## 2021-05-25 ENCOUNTER — Encounter: Payer: Medicare Other | Admitting: Internal Medicine

## 2021-06-19 ENCOUNTER — Encounter: Payer: Self-pay | Admitting: Internal Medicine

## 2021-06-19 ENCOUNTER — Ambulatory Visit (INDEPENDENT_AMBULATORY_CARE_PROVIDER_SITE_OTHER): Payer: Medicare Other | Admitting: Internal Medicine

## 2021-06-19 ENCOUNTER — Other Ambulatory Visit: Payer: Self-pay

## 2021-06-19 VITALS — BP 128/64 | HR 67 | Temp 98.0°F | Ht 60.98 in | Wt 140.4 lb

## 2021-06-19 DIAGNOSIS — E1165 Type 2 diabetes mellitus with hyperglycemia: Secondary | ICD-10-CM

## 2021-06-19 DIAGNOSIS — R440 Auditory hallucinations: Secondary | ICD-10-CM

## 2021-06-19 DIAGNOSIS — F439 Reaction to severe stress, unspecified: Secondary | ICD-10-CM

## 2021-06-19 DIAGNOSIS — I10 Essential (primary) hypertension: Secondary | ICD-10-CM | POA: Diagnosis not present

## 2021-06-19 DIAGNOSIS — E875 Hyperkalemia: Secondary | ICD-10-CM | POA: Diagnosis not present

## 2021-06-19 DIAGNOSIS — I471 Supraventricular tachycardia: Secondary | ICD-10-CM | POA: Diagnosis not present

## 2021-06-19 DIAGNOSIS — E7800 Pure hypercholesterolemia, unspecified: Secondary | ICD-10-CM

## 2021-06-19 DIAGNOSIS — I700 Atherosclerosis of aorta: Secondary | ICD-10-CM

## 2021-06-19 DIAGNOSIS — N1831 Chronic kidney disease, stage 3a: Secondary | ICD-10-CM

## 2021-06-19 LAB — BASIC METABOLIC PANEL
BUN: 25 mg/dL — ABNORMAL HIGH (ref 6–23)
CO2: 20 mEq/L (ref 19–32)
Calcium: 9.3 mg/dL (ref 8.4–10.5)
Chloride: 103 mEq/L (ref 96–112)
Creatinine, Ser: 1.18 mg/dL (ref 0.40–1.20)
GFR: 40.13 mL/min — ABNORMAL LOW (ref >60.00)
Glucose, Bld: 91 mg/dL (ref 70–99)
Potassium: 4.7 mEq/L (ref 3.5–5.1)
Sodium: 134 mEq/L — ABNORMAL LOW (ref 135–145)

## 2021-06-19 LAB — HM DIABETES FOOT EXAM

## 2021-06-19 NOTE — Progress Notes (Signed)
Patient ID: Elizabeth [name], female   DOB: [birth year], 87 y.o.   MRN: [id number]   Subjective:    Patient ID: Elizabeth [name], female    DOB: [birth year], 87 y.o.   MRN: [id number]  HPI This visit occurred during the SARS-CoV-2 public health emergency.  Safety protocols were in place, including screening questions prior to the visit, additional usage of staff PPE, and extensive cleaning of exam room while observing appropriate contact time as indicated for disinfecting solutions.   Patient here for her physical exam.  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  Discussed Charles Bonnett syndrome.  MRI ordered - unremarkable.  She is lonely.  Does not see many people.  Her grandson does visit one time per week.  She has started painting again.  Discussed walking and getting out more.  No chest pain or sob reported.  Eating.  No abdominal pain or bowel change reported.    Past Medical History:  Diagnosis Date   Cystocele or rectocele with incomplete uterine prolapse    Hyperlipidemia    Hypertension    Irritable bowel syndrome    Palpitations    Vaginal atrophy    Past Surgical History:  Procedure Laterality Date   CATARACT EXTRACTION  2006   Family History  Problem Relation Age of Onset   Transient ischemic attack Mother        multiple    Dementia Mother    Stroke Father    Heart disease Brother        MI - died age 36   Leukemia Brother    Lung cancer Brother    Throat cancer Brother        had heart disease also   Heart disease Sister    Dementia Sister    Social History   Socioeconomic History   Marital status: Widowed    Spouse name: Not on file   Number of children: 2   Years of education: Not on file   Highest education level: Not on file  Occupational History   Not on file  Tobacco Use   Smoking status: Never   Smokeless tobacco: Never  Vaping Use   Vaping Use: Never used  Substance and Sexual  Activity   Alcohol use: No    Alcohol/week: 0.0 standard drinks   Drug use: No   Sexual activity: Not Currently    Birth control/protection: None  Other Topics Concern   Not on file  Social History Narrative   Not on file   Social Determinants of Health   Financial Resource Strain: Low Risk    Difficulty of Paying Living Expenses: Not hard at all  Food Insecurity: No Food Insecurity   Worried About Charity fundraiser in the Last Year: Never true   [place] in the Last Year: Never true  Transportation Needs: No Transportation Needs   Lack of Transportation (Medical): No   Lack of Transportation (Non-Medical): No  Physical Activity: Insufficiently Active   Days of Exercise per Week: 7 days   Minutes of Exercise per Session: 20 min  Stress: No Stress Concern Present   Feeling of Stress : Not at all  Social Connections: Unknown   Frequency of Communication with Friends and Family: Not on file   Frequency of Social Gatherings with Friends and Family: Not on file   Attends Religious Services: Not on file   Active Member of Clubs or Organizations: Not  on file   Attends Club or Organization Meetings: Not on file   Marital Status: Widowed     Review of Systems  Constitutional:  Negative for appetite change and unexpected weight change.  HENT:  Negative for congestion and sinus pressure.   Respiratory:  Negative for cough, chest tightness and shortness of breath.   Cardiovascular:  Negative for chest pain, palpitations and leg swelling.  Gastrointestinal:  Negative for abdominal pain, diarrhea, nausea and vomiting.  Genitourinary:  Negative for difficulty urinating and dysuria.  Musculoskeletal:  Negative for joint swelling and myalgias.  Skin:  Negative for color change and rash.  Neurological:  Negative for dizziness, light-headedness and headaches.  Psychiatric/Behavioral:  Negative for agitation and dysphoric mood.       Objective:    Physical Exam Vitals  reviewed.  Constitutional:      General: She is not in acute distress.    Appearance: Normal appearance.  HENT:     Head: Normocephalic and atraumatic.     Right Ear: External ear normal.     Left Ear: External ear normal.  Eyes:     General: No scleral icterus.       Right eye: No discharge.        Left eye: No discharge.     Conjunctiva/sclera: Conjunctivae normal.  Neck:     Thyroid: No thyromegaly.  Cardiovascular:     Rate and Rhythm: Normal rate and regular rhythm.  Pulmonary:     Effort: No respiratory distress.     Breath sounds: Normal breath sounds. No wheezing.  Abdominal:     General: Bowel sounds are normal.     Palpations: Abdomen is soft.     Tenderness: There is no abdominal tenderness.  Musculoskeletal:        General: No swelling or tenderness.     Cervical back: Neck supple. No tenderness.  Lymphadenopathy:     Cervical: No cervical adenopathy.  Skin:    Findings: No erythema or rash.  Neurological:     Mental Status: She is alert.  Psychiatric:        Mood and Affect: Mood normal.        Behavior: Behavior normal.    BP 128/64   Pulse 67   Temp 98 F (36.7 C)   Ht 5' 0.98" (1.549 m)   Wt 140 lb 6.4 oz (63.7 kg)   SpO2 94%   BMI 26.54 kg/m  Wt Readings from Last 3 Encounters:  06/19/21 140 lb 6.4 oz (63.7 kg)  04/03/21 141 lb (64 kg)  02/09/21 140 lb (63.5 kg)    Outpatient Encounter Medications as of 06/19/2021  Medication Sig   aspirin 81 MG tablet Take 81 mg by mouth daily.   cetirizine (ZYRTEC) 5 MG tablet Take 5 mg by mouth daily as needed.    estradiol (ESTRACE) 0.1 MG/GM vaginal cream PLACE 1 APPLICATORFUL VAGINALLY 2 TIMES A WEEK   LIPITOR 40 MG tablet TAKE 1 TABLET BY MOUTH ONCE DAILY   magnesium oxide (MAG-OX) 400 (241.3 Mg) MG tablet TAKE 1 TABLET BY MOUTH ONCE DAILY   metoprolol tartrate (LOPRESSOR) 25 MG tablet Take 1 tablet (25 mg total) by mouth 2 (two) times daily.   quinapril (ACCUPRIL) 40 MG tablet Take 1 tablet (40 mg  total) by mouth daily.   triamcinolone (NASACORT) 55 MCG/ACT nasal inhaler Place 2 sprays into the nose daily as needed.    hydrochlorothiazide (HYDRODIURIL) 25 MG tablet Take 0.5 tablets (12.5 mg total)  by mouth daily.   No facility-administered encounter medications on file as of 06/19/2021.     Lab Results  Component Value Date   WBC 6.4 09/15/2020   HGB 14.1 09/15/2020   HCT 42.1 09/15/2020   PLT 219.0 09/15/2020   GLUCOSE 91 06/19/2021   CHOL 152 04/03/2021   TRIG 152.0 (H) 04/03/2021   HDL 46.50 04/03/2021   LDLDIRECT 178.4 11/04/2013   LDLCALC 75 04/03/2021   ALT 17 04/03/2021   AST 18 04/03/2021   NA 134 (L) 06/19/2021   K 4.7 06/19/2021   CL 103 06/19/2021   CREATININE 1.18 06/19/2021   BUN 25 (H) 06/19/2021   CO2 20 06/19/2021   TSH 3.19 04/03/2021   HGBA1C 6.6 (H) 04/03/2021   MICROALBUR <0.7 04/05/2021    MR BRAIN WO CONTRAST  Result Date: 05/01/2021 CLINICAL DATA:  Years music when trying to sleep EXAM: MRI HEAD WITHOUT CONTRAST TECHNIQUE: Multiplanar, multiecho pulse sequences of the brain and surrounding structures were obtained without intravenous contrast. COMPARISON:  10/12/2004 brain MRI report. FINDINGS: Brain: No acute or subacute infarction, hemorrhage, hydrocephalus, extra-axial collection or mass lesion. Mild for age cerebral volume loss and chronic small vessel ischemic change. Thin section images through the temporal bones show no retrocochlear mass or nerve root thickening. Vascular: Normal flow voids Skull and upper cervical spine: Normal marrow signal Sinuses/Orbits: Bilateral cataract resection IMPRESSION: Unremarkable brain MRI for age. Electronically Signed   By: Monte Fantasia M.D.   On: 05/01/2021 19:23       Assessment & Plan:   Problem List Items Addressed This Visit     Aortic atherosclerosis ([place])    Continue lipitor and aspirin.         Auditory hallucinations    Saw neurology.  MRI - unremarkable.  Labs obtained and reviewed.   Discussed Charles Bonnett syndrome.  Follow.        CKD (chronic kidney disease) stage 3, GFR 30-59 ml/min (HCC)    GFR 46 on recent check.  Continue to stay hydrated.  Avoid antiinflammatories.  Recheck metabolic panel today.        Hypercholesterolemia    On lipitor.  Low cholesterol diet and exercise.  Follow lipid panel and liver function tests.        Hyperkalemia - Primary   Relevant Orders   Basic metabolic panel (Completed)   Hypertension    Blood pressure doing well on accupril and metoprolol.  Taking name brand ace inhibitor.  Works better for her.  feels bp better controlled.  Follow pressures.  Follow metabolic panel.        Stress    Increased stress. Discussed.  Denies depression.  Does not feel needs any further intervention.  Follow.        SVT (supraventricular tachycardia) ([place])    Followed by cardiology.  Continue lopressor.  Overall stable.  Walking treadmill.  Follow.        Type 2 diabetes mellitus with hyperglycemia (HCC)    Low carb diet and exercise given elevated blood glucose. Follow met b and a1c.          Einar Pheasant, MD

## 2021-06-26 ENCOUNTER — Encounter: Payer: Self-pay | Admitting: Internal Medicine

## 2021-06-26 DIAGNOSIS — I700 Atherosclerosis of aorta: Secondary | ICD-10-CM | POA: Insufficient documentation

## 2021-06-26 NOTE — Assessment & Plan Note (Signed)
Blood pressure doing well on accupril and metoprolol.  Taking name brand ace inhibitor.  Works better for her.  feels bp better controlled.  Follow pressures.  Follow metabolic panel.

## 2021-06-26 NOTE — Assessment & Plan Note (Signed)
Continue lipitor and aspirin.  ?

## 2021-06-26 NOTE — Assessment & Plan Note (Signed)
Followed by cardiology.  Continue lopressor.  Overall stable.  Walking treadmill.  Follow.

## 2021-06-26 NOTE — Assessment & Plan Note (Signed)
GFR 46 on recent check.  Continue to stay hydrated.  Avoid antiinflammatories.  Recheck metabolic panel today.

## 2021-06-26 NOTE — Assessment & Plan Note (Signed)
Increased stress. Discussed.  Denies depression.  Does not feel needs any further intervention.  Follow.

## 2021-06-26 NOTE — Assessment & Plan Note (Signed)
Saw neurology.  MRI - unremarkable.  Labs obtained and reviewed.  Discussed Charles Bonnett syndrome.  Follow.

## 2021-06-26 NOTE — Assessment & Plan Note (Signed)
Low carb diet and exercise given elevated blood glucose. Follow met b and a1c.  

## 2021-06-26 NOTE — Assessment & Plan Note (Signed)
On lipitor.  Low cholesterol diet and exercise.  Follow lipid panel and liver function tests.   

## 2021-08-06 NOTE — Progress Notes (Signed)
Cardiology Office Note  Date:  08/07/2021   ID:  Elizabeth [name], DOB 11-Apr-1927, MRN [id number]  PCP:  Dale Morrison, MD   Chief Complaint  Patient presents with   6 month follow up     "Doing well." Medications reviewed by the patient verbally.     HPI:  Elizabeth [name] is an 87 year old woman with history of  hypertension,  hyperlipidemia  Rare episodes of near syncope, no arrhythmia on monitor who presents for  Follow-up of her arrhythmia, lightheadedness, Hypertension Chronic atypical chest sensation, palpitations Previously worked up with zio monitor  LOV 11/2019 Seen by one of our providers February 2022 Was participating with PT at that time Has completed PT, balance better, but sedentary now  Adjusting to loss of husband last year Lives independently alone in an apartment block  Echocardiogram 01/19/21 with LVEF 55-60%, grade 1 diastolic dysfunction, no RWMA, normal PASP, mild MR, mild AI   Walks daily Mild weight gain Less active, staying inside, "lazy"  EKG personally reviewed by myself on todays visit NSR rate 64 bpm LAD  emergency room August 2020 for chest pain, ruled out, felt to be atypical in nature  On last clinic visit November 2020 was walking on the treadmill Continued medication noncompliance Continued problems with anxiety  ZIO monitor with rare short runs of tachycardia SVT/atrial tach longest 12 seconds Average heart rate 64 bpm No change in monitor compares to 11/2018  Labs reviewed Total chol 151, LDL 81   Other past medical hx previous episode of severe tachycardia , persisted longer than other episodes ,  Seem to go away on its own without intervention.  She does have significant stress at home, taking care of her husband. She is the primary caretaker    long history of short runs of palpitations, she has had these for 20 years  previously was using the treadmill 4-5 times per week She tried a lower dose of benazepril, reported her  systolic pressure was 170 in the evenings on 20 mg daily, went back to 40 mg daily   PMH:   has a past medical history of Cystocele or rectocele with incomplete uterine prolapse, Hyperlipidemia, Hypertension, Irritable bowel syndrome, Palpitations, and Vaginal atrophy.  PSH:    Past Surgical History:  Procedure Laterality Date   CATARACT EXTRACTION  2006    Current Outpatient Medications  Medication Sig Dispense Refill   aspirin 81 MG tablet Take 81 mg by mouth daily.     cetirizine (ZYRTEC) 5 MG tablet Take 5 mg by mouth daily as needed.      estradiol (ESTRACE) 0.1 MG/GM vaginal cream PLACE 1 APPLICATORFUL VAGINALLY 2 TIMES A WEEK 42.5 g 2   hydrochlorothiazide (HYDRODIURIL) 25 MG tablet Take 0.5 tablets (12.5 mg total) by mouth daily. 15 tablet 2   LIPITOR 40 MG tablet TAKE 1 TABLET BY MOUTH ONCE DAILY 90 tablet 3   magnesium oxide (MAG-OX) 400 (241.3 Mg) MG tablet TAKE 1 TABLET BY MOUTH ONCE DAILY 30 tablet 6   metoprolol tartrate (LOPRESSOR) 25 MG tablet Take 1 tablet (25 mg total) by mouth 2 (two) times daily. 180 tablet 3   quinapril (ACCUPRIL) 40 MG tablet Take 1 tablet (40 mg total) by mouth daily. 90 tablet 3   triamcinolone (NASACORT) 55 MCG/ACT nasal inhaler Place 2 sprays into the nose daily as needed.      No current facility-administered medications for this visit.     Allergies:   Patient has no known allergies.  Social History:  The patient  reports that she has never smoked. She has never used smokeless tobacco. She reports that she does not drink alcohol and does not use drugs.   Family History:   family history includes Dementia in her mother and sister; Heart disease in her brother and sister; Leukemia in her brother; Lung cancer in her brother; Stroke in her father; Throat cancer in her brother; Transient ischemic attack in her mother.    Review of Systems: Review of Systems  Constitutional: Negative.   HENT: Negative.    Respiratory: Negative.     Cardiovascular: Negative.   Gastrointestinal: Negative.   Musculoskeletal: Negative.   Neurological: Negative.   Psychiatric/Behavioral:  The patient is nervous/anxious.   All other systems reviewed and are negative.  PHYSICAL EXAM: VS:  BP 130/70 (BP Location: Left Arm, Patient Position: Sitting, Cuff Size: Normal)   Pulse 64   Ht 5' 1.25" (1.556 m)   Wt 142 lb (64.4 kg)   SpO2 98%   BMI 26.61 kg/m  , BMI Body mass index is 26.61 kg/m. Constitutional:  oriented to person, place, and time. No distress.  HENT:  Head: Grossly normal Eyes:  no discharge. No scleral icterus.  Neck: No JVD, no carotid bruits  Cardiovascular: Regular rate and rhythm, no murmurs appreciated Pulmonary/Chest: Clear to auscultation bilaterally, no wheezes or rails Abdominal: Soft.  no distension.  no tenderness.  Musculoskeletal: Normal range of motion Neurological:  normal muscle tone. Coordination normal. No atrophy Skin: Skin warm and dry Psychiatric: normal affect, pleasant   Recent Labs: 09/15/2020: Hemoglobin 14.1; Platelets 219.0 04/03/2021: ALT 17; TSH 3.19 06/19/2021: BUN 25; Creatinine, Ser 1.18; Potassium 4.7; Sodium 134    Lipid Panel Lab Results  Component Value Date   CHOL 152 04/03/2021   HDL 46.50 04/03/2021   LDLCALC 75 04/03/2021   TRIG 152.0 (H) 04/03/2021      Wt Readings from Last 3 Encounters:  08/07/21 142 lb (64.4 kg)  06/19/21 140 lb 6.4 oz (63.7 kg)  04/03/21 141 lb (64 kg)     ASSESSMENT AND PLAN:  Cardiac arrhythmia, unspecified cardiac arrhythmia type  Rare shorts runs of SVT/atrial tach on monitor  Rare palpitations Minimal symptoms No med changes  Essential hypertension -  Blood pressure is well controlled on today's visit. No changes made to the medications.  Hypercholesterolemia Cholesterol is at goal on the current lipid regimen. No changes to the medications were made.  Stress Significant anxiety, stress Managed by PMD   Total encounter  time more than 25 minutes  Greater than 50% was spent in counseling and coordination of care with the patient   No orders of the defined types were placed in this encounter.    Signed, Dossie Arbour, M.D., Ph.D. 08/07/2021  [facility] [place], [place] [phone]

## 2021-08-07 ENCOUNTER — Other Ambulatory Visit: Payer: Self-pay

## 2021-08-07 ENCOUNTER — Ambulatory Visit (INDEPENDENT_AMBULATORY_CARE_PROVIDER_SITE_OTHER): Payer: Medicare Other | Admitting: Cardiovascular Disease

## 2021-08-07 ENCOUNTER — Encounter: Payer: Self-pay | Admitting: Cardiovascular Disease

## 2021-08-07 VITALS — BP 130/70 | HR 64 | Ht 61.25 in | Wt 142.0 lb

## 2021-08-07 DIAGNOSIS — I10 Essential (primary) hypertension: Secondary | ICD-10-CM

## 2021-08-07 DIAGNOSIS — R0602 Shortness of breath: Secondary | ICD-10-CM | POA: Diagnosis not present

## 2021-08-07 DIAGNOSIS — R002 Palpitations: Secondary | ICD-10-CM

## 2021-08-07 DIAGNOSIS — I471 Supraventricular tachycardia: Secondary | ICD-10-CM | POA: Diagnosis not present

## 2021-08-07 DIAGNOSIS — R600 Localized edema: Secondary | ICD-10-CM | POA: Diagnosis not present

## 2021-08-07 DIAGNOSIS — I872 Venous insufficiency (chronic) (peripheral): Secondary | ICD-10-CM

## 2021-08-07 DIAGNOSIS — E782 Mixed hyperlipidemia: Secondary | ICD-10-CM

## 2021-08-07 MED ORDER — LIPITOR 40 MG PO TABS: 40.0000 mg | ORAL_TABLET | Freq: Every day | ORAL | 3 refills | Status: DC

## 2021-08-07 MED ORDER — METOPROLOL TARTRATE 25 MG PO TABS: 25.0000 mg | ORAL_TABLET | Freq: Two times a day (BID) | ORAL | 3 refills | Status: DC

## 2021-08-07 MED ORDER — QUINAPRIL HCL 40 MG PO TABS: 40.0000 mg | ORAL_TABLET | Freq: Every day | ORAL | 3 refills | Status: DC

## 2021-08-07 MED ORDER — HYDROCHLOROTHIAZIDE 25 MG PO TABS: 12.5000 mg | ORAL_TABLET | Freq: Every day | ORAL | 3 refills | Status: DC

## 2021-08-07 NOTE — Patient Instructions (Addendum)
Medication Instructions:  No changes  If you need a refill on your cardiac medications before your next appointment, please call your pharmacy.   Lab work: No new labs needed  Testing/Procedures: No new testing needed  Follow-Up: At [facility], you and your health needs are our priority.  As part of our continuing mission to provide you with exceptional heart care, we have created designated Provider Care Teams.  These Care Teams include your primary Cardiologist (physician) and Advanced Practice Providers (APPs -  Physician Assistants and Nurse Practitioners) who all work together to provide you with the care you need, when you need it.  You will need a follow up appointment in 12 months  Providers on your designated Care Team:   Christopher Berge, NP Ryan Dunn, PA-C Jacquelyn Visser, PA-C Cadence Furth, PA-C  COVID-19 Vaccine Information can be found at: [email or web address] For questions related to vaccine distribution or appointments, please email [email or web address] or call [phone].    

## 2021-09-19 ENCOUNTER — Ambulatory Visit (INDEPENDENT_AMBULATORY_CARE_PROVIDER_SITE_OTHER): Payer: Medicare Other

## 2021-09-19 VITALS — Ht 61.25 in | Wt 142.0 lb

## 2021-09-19 DIAGNOSIS — Z0000 Encounter for general adult medical examination without abnormal findings: Secondary | ICD-10-CM | POA: Diagnosis not present

## 2021-09-19 NOTE — Progress Notes (Signed)
Subjective:   Elizabeth [name] is a 87 y.o. female who presents for Medicare Annual (Subsequent) preventive examination.  Review of Systems    No ROS.  Medicare Wellness Virtual Visit.  Visual/audio telehealth visit, UTA vital signs.   See social history for additional risk factors.   Cardiac Risk Factors include: advanced age (>26men, >85 women);diabetes mellitus;hypertension     Objective:    Today's Vitals   09/19/21 1204  Weight: 142 lb (64.4 kg)  Height: 5' 1.25" (1.556 m)   Body mass index is 26.61 kg/m.  Advanced Directives 09/19/2021 02/06/2021 09/16/2020 09/16/2019 07/31/2019 01/06/2019 05/28/2018  Does Patient Have a Medical Advance Directive? Yes No Yes No No Yes Yes  Type of Estate agent of [place];Living will - Healthcare Power of [employer] - - Healthcare Power of [employer] Power of [place];Living will  Does patient want to make changes to medical advance directive? No - Patient declined No - Patient declined No - Patient declined - - Yes (Inpatient - patient requests chaplain consult to change a medical advance directive) No - Patient declined  Copy of Healthcare Power of Attorney in Chart? Yes - validated most recent copy scanned in chart (See row information) - No - copy requested - - - No - copy requested  Would patient like information on creating a medical advance directive? - - - No - Patient declined - - -    Current Medications (verified) Outpatient Encounter Medications as of 09/19/2021  Medication Sig   aspirin 81 MG tablet Take 81 mg by mouth daily.   cetirizine (ZYRTEC) 5 MG tablet Take 5 mg by mouth daily as needed.    estradiol (ESTRACE) 0.1 MG/GM vaginal cream PLACE 1 APPLICATORFUL VAGINALLY 2 TIMES A WEEK   hydrochlorothiazide (HYDRODIURIL) 25 MG tablet Take 0.5 tablets (12.5 mg total) by mouth daily.   LIPITOR 40 MG tablet Take 1 tablet (40 mg total) by mouth daily.   magnesium oxide (MAG-OX) 400 (241.3 Mg) MG tablet TAKE 1  TABLET BY MOUTH ONCE DAILY   metoprolol tartrate (LOPRESSOR) 25 MG tablet Take 1 tablet (25 mg total) by mouth 2 (two) times daily.   quinapril (ACCUPRIL) 40 MG tablet Take 1 tablet (40 mg total) by mouth daily.   triamcinolone (NASACORT) 55 MCG/ACT nasal inhaler Place 2 sprays into the nose daily as needed.    No facility-administered encounter medications on file as of 09/19/2021.    Allergies (verified) Patient has no known allergies.   History: Past Medical History:  Diagnosis Date   Cystocele or rectocele with incomplete uterine prolapse    Hyperlipidemia    Hypertension    Irritable bowel syndrome    Palpitations    Vaginal atrophy    Past Surgical History:  Procedure Laterality Date   CATARACT EXTRACTION  2006   Family History  Problem Relation Age of Onset   Transient ischemic attack Mother        multiple    Dementia Mother    Stroke Father    Heart disease Brother        MI - died age 6   Leukemia Brother    Lung cancer Brother    Throat cancer Brother        had heart disease also   Heart disease Sister    Dementia Sister    Social History   Socioeconomic History   Marital status: Widowed    Spouse name: Not on file   Number of children: 2  Years of education: Not on file   Highest education level: Not on file  Occupational History   Not on file  Tobacco Use   Smoking status: Never   Smokeless tobacco: Never  Vaping Use   Vaping Use: Never used  Substance and Sexual Activity   Alcohol use: No    Alcohol/week: 0.0 standard drinks   Drug use: No   Sexual activity: Not Currently    Birth control/protection: None  Other Topics Concern   Not on file  Social History Narrative   Not on file   Social Determinants of Health   Financial Resource Strain: Low Risk    Difficulty of Paying Living Expenses: Not hard at all  Food Insecurity: No Food Insecurity   Worried About Programme researcher, broadcasting/film/video in the Last Year: Never true   Ran Out of Food in the  Last Year: Never true  Transportation Needs: No Transportation Needs   Lack of Transportation (Medical): No   Lack of Transportation (Non-Medical): No  Physical Activity: Insufficiently Active   Days of Exercise per Week: 7 days   Minutes of Exercise per Session: 20 min  Stress: No Stress Concern Present   Feeling of Stress : Not at all  Social Connections: Unknown   Frequency of Communication with Friends and Family: More than three times a week   Frequency of Social Gatherings with Friends and Family: Not on file   Attends Religious Services: Not on file   Active Member of Clubs or Organizations: Not on file   Attends Banker Meetings: Not on file   Marital Status: Widowed    Tobacco Counseling Counseling given: Not Answered   Clinical Intake:  Pre-visit preparation completed: Yes        Diabetes:  (Followed by pcp)  How often do you need to have someone help you when you read instructions, pamphlets, or other written materials from your doctor or pharmacy?: 1 - Never    Interpreter Needed?: No      Activities of Daily Living In your present state of health, do you have any difficulty performing the following activities: 09/19/2021  Hearing? Y  Comment Hearing aids  Vision? N  Difficulty concentrating or making decisions? N  Walking or climbing stairs? N  Dressing or bathing? N  Doing errands, shopping? N  Preparing Food and eating ? N  Using the Toilet? N  In the past six months, have you accidently leaked urine? Y  Comment Managed with daily liner  Do you have problems with loss of bowel control? N  Managing your Medications? N  Managing your Finances? N  Housekeeping or managing your Housekeeping? N  Some recent data might be hidden    Patient Care Team: Dale Los Llanos, MD as PCP - General (Internal Medicine) Antonieta Iba, MD as PCP - Cardiology (Cardiology)  Indicate any recent Medical Services you may have received from other  than [facility] providers in the past year (date may be approximate).     Assessment:   This is a routine wellness examination for [place].  I connected with Elizabeth [name] today by telephone and verified that I am speaking with the correct person using two identifiers. Location patient: home Location provider: work Persons participating in the virtual visit: patient, Engineer, civil (consulting).    I discussed the limitations, risks, security and privacy concerns of performing an evaluation and management service by telephone and the availability of in person appointments. The patient expressed understanding and verbally consented to this  telephonic visit.    Interactive audio and video telecommunications were attempted between this provider and patient, however failed, due to patient having technical difficulties OR patient did not have access to video capability.  We continued and completed visit with audio only.  Some vital signs may be absent or patient reported.   Hearing/Vision screen Hearing Screening - Comments:: Hearing aid, bilateral  Vision Screening - Comments:: Followed by [facility]  Wears corrective lenses when driving  Cataract extraction, bilateral  They have regular follow up with the ophthalmologist  Dietary issues and exercise activities discussed: Current Exercise Habits: Home exercise routine, Type of exercise: stretching;treadmill, Time (Minutes): 30, Intensity: Mild Healthy diet Good water intake   Goals Addressed             This Visit's Progress    Increase physical activity       I want to walk more for exercises       Depression Screen PHQ 2/9 Scores 09/19/2021 06/19/2021 09/16/2020 09/16/2019 01/06/2019 05/28/2018 05/27/2017  PHQ - 2 Score 0 0 0 0 1 0 0  PHQ- 9 Score - - - - 3 - -    Fall Risk Fall Risk  09/19/2021 09/16/2020 09/16/2019 06/25/2019 05/28/2018  Falls in the past year? - 0 0 0 No  Number falls in past yr: - 0 - - -  Follow up Falls evaluation completed Falls  evaluation completed - - -    FALL RISK PREVENTION PERTAINING TO THE HOME: Adequate lighting in your home to reduce risk of falls? Yes   ASSISTIVE DEVICES UTILIZED TO PREVENT FALLS: Use of a cane, walker or w/c? No   TIMED UP AND GO: Was the test performed? No .   Cognitive Function: Patient is alert and oriented x3.  Enjoys painting, reading and other brain stimulating activities.  MMSE - Mini Mental State Exam 04/04/2021 05/28/2018  Orientation to time 4 5  Orientation to Place 5 5  Registration 3 3  Attention/ Calculation 5 5  Recall 3 3  Language- name 2 objects 2 2  Language- repeat 1 1  Language- follow 3 step command 3 3  Language- read & follow direction 1 1  Write a sentence 1 1  Copy design 1 1  Total score 29 30     6CIT Screen 09/19/2021 09/16/2019 05/27/2017  What Year? 0 points 0 points 0 points  What month? 0 points 0 points 0 points  What time? 0 points 0 points 0 points  Count back from 20 - 0 points 0 points  Months in reverse - 0 points 0 points  Repeat phrase - 0 points 0 points  Total Score - 0 0    Immunizations Immunization History  Administered Date(s) Administered   Fluad Quad(high Dose 65+) 10/15/2019   Influenza Split 10/19/2009, 10/13/2013, 09/30/2014   Influenza, High Dose Seasonal PF 09/28/2016, 10/30/2017, 11/04/2018   Influenza-Unspecified 10/24/2015, 09/20/2020   PFIZER(Purple Top)SARS-COV-2 Vaccination 01/25/2020, 02/15/2020, 03/14/2021   Pneumococcal Conjugate-13 03/11/2014   Pneumococcal Polysaccharide-23 05/31/2017    Shingrix vaccine- Due, Education has been provided regarding the importance of this vaccine. Advised may receive this vaccine at local pharmacy or Health Dept. Aware to provide a copy of the vaccination record if obtained from local pharmacy or Health Dept. Verbalized acceptance and understanding.  Tdap vaccine- Due, Education has been provided regarding the importance of this vaccine. Advised may receive this vaccine  at local pharmacy or Health Dept. Aware to provide a copy of the  vaccination record if obtained from local pharmacy or Health Dept. Verbalized acceptance and understanding.  Bone density- Declined.   Health Maintenance Health Maintenance  Topic Date Due   COVID-19 Vaccine (4 - Booster for Pfizer series) 10/05/2021 (Originally 07/14/2021)   Zoster Vaccines- Shingrix (1 of 2) 12/19/2021 (Originally 01/26/1979)   INFLUENZA VACCINE  03/23/2022 (Originally 07/24/2021)   DEXA SCAN  09/19/2022 (Originally 01/26/1994)   TETANUS/TDAP  09/19/2022 (Originally 01/27/1946)   HEMOGLOBIN A1C  10/03/2021   OPHTHALMOLOGY EXAM  06/07/2022   FOOT EXAM  06/19/2022   HPV VACCINES  Aged Out   MAMMOGRAM  Discontinued   Colorectal cancer screening: No longer required.   Lung Cancer Screening: (Low Dose CT Chest recommended if Age 71-80 years, 30 pack-year currently smoking OR have quit w/in 15years.) does not qualify.   Hepatitis C Screening: does not qualify  Vision Screening: Recommended annual ophthalmology exams for early detection of glaucoma and other disorders of the eye.  Dental Screening: Recommended annual dental exams for proper oral hygiene  Community Resource Referral / Chronic Care Management: CRR required this visit?  No   CCM required this visit?  No      Plan:   Keep all routine maintenance appointments.   I have personally reviewed and noted the following in the patient's chart:   Medical and social history Use of alcohol, tobacco or illicit drugs  Current medications and supplements including opioid prescriptions. Not taking opioid.  Functional ability and status Nutritional status Physical activity Advanced directives List of other physicians Hospitalizations, surgeries, and ER visits in previous 12 months Vitals Screenings to include cognitive, depression, and falls Referrals and appointments  In addition, I have reviewed and discussed with patient certain preventive  protocols, quality metrics, and best practice recommendations. A written personalized care plan for preventive services as well as general preventive health recommendations were provided to patient via maychart.     Ashok Pall, LPN   12/24/7508

## 2021-09-19 NOTE — Patient Instructions (Addendum)
Elizabeth [name] , Thank you for taking time to come for your Medicare Wellness Visit. I appreciate your ongoing commitment to your health goals. Please review the following plan we discussed and let me know if I can assist you in the future.   These are the goals we discussed:  Goals      Increase physical activity     I want to walk more for exercises        This is a list of the screening recommended for you and due dates:  Health Maintenance  Topic Date Due   COVID-19 Vaccine (4 - Booster for Pfizer series) 10/05/2021*   Zoster (Shingles) Vaccine (1 of 2) 12/19/2021*   Flu Shot  03/23/2022*   DEXA scan (bone density measurement)  09/19/2022*   Tetanus Vaccine  09/19/2022*   Hemoglobin A1C  10/03/2021   Eye exam for diabetics  06/07/2022   Complete foot exam   06/19/2022   HPV Vaccine  Aged Out   Mammogram  Discontinued  *Topic was postponed. The date shown is not the original due date.    Advanced directives: on file  Conditions/risks identified: none new  Follow up in one year for your annual wellness visit    Preventive Care 65 Years and Older, Female Preventive care refers to lifestyle choices and visits with your health care provider that can promote health and wellness. What does preventive care include? A yearly physical exam. This is also called an annual well check. Dental exams once or twice a year. Routine eye exams. Ask your health care provider how often you should have your eyes checked. Personal lifestyle choices, including: Daily care of your teeth and gums. Regular physical activity. Eating a healthy diet. Avoiding tobacco and drug use. Limiting alcohol use. Practicing safe sex. Taking low-dose aspirin every day. Taking vitamin and mineral supplements as recommended by your health care provider. What happens during an annual well check? The services and screenings done by your health care provider during your annual well check will depend on your age,  overall health, lifestyle risk factors, and family history of disease. Counseling  Your health care provider may ask you questions about your: Alcohol use. Tobacco use. Drug use. Emotional well-being. Home and relationship well-being. Sexual activity. Eating habits. History of falls. Memory and ability to understand (cognition). Work and work Astronomer. Reproductive health. Screening  You may have the following tests or measurements: Height, weight, and BMI. Blood pressure. Lipid and cholesterol levels. These may be checked every 5 years, or more frequently if you are over 65 years old. Skin check. Lung cancer screening. You may have this screening every year starting at age 7 if you have a 30-pack-year history of smoking and currently smoke or have quit within the past 15 years. Fecal occult blood test (FOBT) of the stool. You may have this test every year starting at age 75. Flexible sigmoidoscopy or colonoscopy. You may have a sigmoidoscopy every 5 years or a colonoscopy every 10 years starting at age 75. Hepatitis C blood test. Hepatitis B blood test. Sexually transmitted disease (STD) testing. Diabetes screening. This is done by checking your blood sugar (glucose) after you have not eaten for a while (fasting). You may have this done every 1-3 years. Bone density scan. This is done to screen for osteoporosis. You may have this done starting at age 13. Mammogram. This may be done every 1-2 years. Talk to your health care provider about how often you should have regular  mammograms. Talk with your health care provider about your test results, treatment options, and if necessary, the need for more tests. Vaccines  Your health care provider may recommend certain vaccines, such as: Influenza vaccine. This is recommended every year. Tetanus, diphtheria, and acellular pertussis (Tdap, Td) vaccine. You may need a Td booster every 10 years. Zoster vaccine. You may need this after age  32. Pneumococcal 13-valent conjugate (PCV13) vaccine. One dose is recommended after age 74. Pneumococcal polysaccharide (PPSV23) vaccine. One dose is recommended after age 42. Talk to your health care provider about which screenings and vaccines you need and how often you need them. This information is not intended to replace advice given to you by your health care provider. Make sure you discuss any questions you have with your health care provider. Document Released: 01/06/2016 Document Revised: 08/29/2016 Document Reviewed: 10/11/2015 Elsevier Interactive Patient Education  2017 [employer] Prevention in the Home Falls can cause injuries. They can happen to people of all ages. There are many things you can do to make your home safe and to help prevent falls. What can I do on the outside of my home? Regularly fix the edges of walkways and driveways and fix any cracks. Remove anything that might make you trip as you walk through a door, such as a raised step or threshold. Trim any bushes or trees on the path to your home. Use bright outdoor lighting. Clear any walking paths of anything that might make someone trip, such as rocks or tools. Regularly check to see if handrails are loose or broken. Make sure that both sides of any steps have handrails. Any raised decks and porches should have guardrails on the edges. Have any leaves, snow, or ice cleared regularly. Use sand or salt on walking paths during winter. Clean up any spills in your garage right away. This includes oil or grease spills. What can I do in the bathroom? Use night lights. Install grab bars by the toilet and in the tub and shower. Do not use towel bars as grab bars. Use non-skid mats or decals in the tub or shower. If you need to sit down in the shower, use a plastic, non-slip stool. Keep the floor dry. Clean up any water that spills on the floor as soon as it happens. Remove soap buildup in the tub or shower  regularly. Attach bath mats securely with double-sided non-slip rug tape. Do not have throw rugs and other things on the floor that can make you trip. What can I do in the bedroom? Use night lights. Make sure that you have a light by your bed that is easy to reach. Do not use any sheets or blankets that are too big for your bed. They should not hang down onto the floor. Have a firm chair that has side arms. You can use this for support while you get dressed. Do not have throw rugs and other things on the floor that can make you trip. What can I do in the kitchen? Clean up any spills right away. Avoid walking on wet floors. Keep items that you use a lot in easy-to-reach places. If you need to reach something above you, use a strong step stool that has a grab bar. Keep electrical cords out of the way. Do not use floor polish or wax that makes floors slippery. If you must use wax, use non-skid floor wax. Do not have throw rugs and other things on the floor that can  make you trip. What can I do with my stairs? Do not leave any items on the stairs. Make sure that there are handrails on both sides of the stairs and use them. Fix handrails that are broken or loose. Make sure that handrails are as long as the stairways. Check any carpeting to make sure that it is firmly attached to the stairs. Fix any carpet that is loose or worn. Avoid having throw rugs at the top or bottom of the stairs. If you do have throw rugs, attach them to the floor with carpet tape. Make sure that you have a light switch at the top of the stairs and the bottom of the stairs. If you do not have them, ask someone to add them for you. What else can I do to help prevent falls? Wear shoes that: Do not have high heels. Have rubber bottoms. Are comfortable and fit you well. Are closed at the toe. Do not wear sandals. If you use a stepladder: Make sure that it is fully opened. Do not climb a closed stepladder. Make sure that  both sides of the stepladder are locked into place. Ask someone to hold it for you, if possible. Clearly mark and make sure that you can see: Any grab bars or handrails. First and last steps. Where the edge of each step is. Use tools that help you move around (mobility aids) if they are needed. These include: Canes. Walkers. Scooters. Crutches. Turn on the lights when you go into a dark area. Replace any light bulbs as soon as they burn out. Set up your furniture so you have a clear path. Avoid moving your furniture around. If any of your floors are uneven, fix them. If there are any pets around you, be aware of where they are. Review your medicines with your doctor. Some medicines can make you feel dizzy. This can increase your chance of falling. Ask your doctor what other things that you can do to help prevent falls. This information is not intended to replace advice given to you by your health care provider. Make sure you discuss any questions you have with your health care provider. Document Released: 10/06/2009 Document Revised: 05/17/2016 Document Reviewed: 01/14/2015 Elsevier Interactive Patient Education  2017 [employer].

## 2021-09-21 ENCOUNTER — Ambulatory Visit: Payer: Medicare Other | Admitting: Internal Medicine

## 2021-09-21 ENCOUNTER — Other Ambulatory Visit: Payer: Self-pay

## 2021-09-21 VITALS — BP 118/70 | HR 75 | Temp 97.8°F | Resp 16 | Ht 61.0 in | Wt 143.0 lb

## 2021-09-21 DIAGNOSIS — E1165 Type 2 diabetes mellitus with hyperglycemia: Secondary | ICD-10-CM

## 2021-09-21 DIAGNOSIS — I10 Essential (primary) hypertension: Secondary | ICD-10-CM

## 2021-09-21 DIAGNOSIS — F439 Reaction to severe stress, unspecified: Secondary | ICD-10-CM

## 2021-09-21 DIAGNOSIS — I700 Atherosclerosis of aorta: Secondary | ICD-10-CM | POA: Diagnosis not present

## 2021-09-21 DIAGNOSIS — I471 Supraventricular tachycardia: Secondary | ICD-10-CM

## 2021-09-21 DIAGNOSIS — E7800 Pure hypercholesterolemia, unspecified: Secondary | ICD-10-CM

## 2021-09-21 DIAGNOSIS — Z23 Encounter for immunization: Secondary | ICD-10-CM

## 2021-09-21 DIAGNOSIS — N1831 Chronic kidney disease, stage 3a: Secondary | ICD-10-CM

## 2021-09-21 DIAGNOSIS — R440 Auditory hallucinations: Secondary | ICD-10-CM

## 2021-09-21 NOTE — Progress Notes (Signed)
Patient ID: Elizabeth [name], female   DOB: 1927-10-13, 87 y.o.   MRN: [id number]   Subjective:    Patient ID: Elizabeth [name], female    DOB: Feb 25, 1927, 87 y.o.   MRN: [id number]  This visit occurred during the SARS-CoV-2 public health emergency.  Safety protocols were in place, including screening questions prior to the visit, additional usage of staff PPE, and extensive cleaning of exam room while observing appropriate contact time as indicated for disinfecting solutions.   Patient here for a scheduled follow up.   Chief Complaint  Patient presents with   Diabetes   Gastroesophageal Reflux   Hypertension   .   HPI Recently saw cardiology 08/07/21 - f/u - rare short runs of SVT/atrial tach.  Stable. Minimal symptoms.  No chest pain.  Trying to stay active.  Plans to join sliver sneakers.  No acid reflux.  No abdominal pain.  Bowels moving.  Saw neurology.  MRI brain - unremarkable.  Continues vitamin B12 supplements and vitamin D supplements.     Past Medical History:  Diagnosis Date   Cystocele or rectocele with incomplete uterine prolapse    Hyperlipidemia    Hypertension    Irritable bowel syndrome    Palpitations    Vaginal atrophy    Past Surgical History:  Procedure Laterality Date   CATARACT EXTRACTION  2006   Family History  Problem Relation Age of Onset   Transient ischemic attack Mother        multiple    Dementia Mother    Stroke Father    Heart disease Brother        MI - died age 67   Leukemia Brother    Lung cancer Brother    Throat cancer Brother        had heart disease also   Heart disease Sister    Dementia Sister    Social History   Socioeconomic History   Marital status: Widowed    Spouse name: Not on file   Number of children: 2   Years of education: Not on file   Highest education level: Not on file  Occupational History   Not on file  Tobacco Use   Smoking status: Never   Smokeless tobacco: Never  Vaping Use   Vaping Use: Never used   Substance and Sexual Activity   Alcohol use: No    Alcohol/week: 0.0 standard drinks   Drug use: No   Sexual activity: Not Currently    Birth control/protection: None  Other Topics Concern   Not on file  Social History Narrative   Not on file   Social Determinants of Health   Financial Resource Strain: Low Risk    Difficulty of Paying Living Expenses: Not hard at all  Food Insecurity: No Food Insecurity   Worried About Charity fundraiser in the Last Year: Never true   [place] in the Last Year: Never true  Transportation Needs: No Transportation Needs   Lack of Transportation (Medical): No   Lack of Transportation (Non-Medical): No  Physical Activity: Insufficiently Active   Days of Exercise per Week: 7 days   Minutes of Exercise per Session: 20 min  Stress: No Stress Concern Present   Feeling of Stress : Not at all  Social Connections: Unknown   Frequency of Communication with Friends and Family: More than three times a week   Frequency of Social Gatherings with Friends and Family: Not on file   Attends  Religious Services: Not on file   Active Member of Clubs or Organizations: Not on file   Attends Club or Organization Meetings: Not on file   Marital Status: Widowed     Review of Systems  Constitutional:  Negative for appetite change and unexpected weight change.  HENT:  Negative for congestion and sinus pressure.   Respiratory:  Negative for cough, chest tightness and shortness of breath.   Cardiovascular:  Negative for chest pain and palpitations.  Gastrointestinal:  Negative for abdominal pain, diarrhea, nausea and vomiting.  Genitourinary:  Negative for difficulty urinating and dysuria.  Musculoskeletal:  Negative for joint swelling and myalgias.  Skin:  Negative for color change and rash.  Neurological:  Negative for dizziness, light-headedness and headaches.  Psychiatric/Behavioral:  Negative for agitation and dysphoric mood.       Objective:      BP 118/70   Pulse 75   Temp 97.8 F (36.6 C)   Resp 16   Ht _0  (1.549 m)   Wt 143 lb (64.9 kg)   SpO2 98%   BMI 27.02 kg/m  Wt Readings from Last 3 Encounters:  09/21/21 143 lb (64.9 kg)  09/19/21 142 lb (64.4 kg)  08/07/21 142 lb (64.4 kg)    Physical Exam Vitals reviewed.  Constitutional:      General: She is not in acute distress.    Appearance: Normal appearance.  HENT:     Head: Normocephalic and atraumatic.     Right Ear: External ear normal.     Left Ear: External ear normal.  Eyes:     General: No scleral icterus.       Right eye: No discharge.        Left eye: No discharge.     Conjunctiva/sclera: Conjunctivae normal.  Neck:     Thyroid: No thyromegaly.  Cardiovascular:     Rate and Rhythm: Normal rate and regular rhythm.  Pulmonary:     Effort: No respiratory distress.     Breath sounds: Normal breath sounds. No wheezing.  Abdominal:     General: Bowel sounds are normal.     Palpations: Abdomen is soft.     Tenderness: There is no abdominal tenderness.  Musculoskeletal:        General: No swelling or tenderness.     Cervical back: Neck supple. No tenderness.  Lymphadenopathy:     Cervical: No cervical adenopathy.  Skin:    Findings: No erythema or rash.  Neurological:     Mental Status: She is alert.  Psychiatric:        Mood and Affect: Mood normal.        Behavior: Behavior normal.     Outpatient Encounter Medications as of 09/21/2021  Medication Sig   aspirin 81 MG tablet Take 81 mg by mouth daily.   cetirizine (ZYRTEC) 5 MG tablet Take 5 mg by mouth daily as needed.    estradiol (ESTRACE) 0.1 MG/GM vaginal cream PLACE 1 APPLICATORFUL VAGINALLY 2 TIMES A WEEK   hydrochlorothiazide (HYDRODIURIL) 25 MG tablet Take 0.5 tablets (12.5 mg total) by mouth daily.   LIPITOR 40 MG tablet Take 1 tablet (40 mg total) by mouth daily.   magnesium oxide (MAG-OX) 400 (241.3 Mg) MG tablet TAKE 1 TABLET BY MOUTH ONCE DAILY   metoprolol tartrate  (LOPRESSOR) 25 MG tablet Take 1 tablet (25 mg total) by mouth 2 (two) times daily.   quinapril (ACCUPRIL) 40 MG tablet Take 1 tablet (40 mg total) by mouth  daily.   triamcinolone (NASACORT) 55 MCG/ACT nasal inhaler Place 2 sprays into the nose daily as needed.    No facility-administered encounter medications on file as of 09/21/2021.     Lab Results  Component Value Date   WBC 6.4 09/15/2020   HGB 14.1 09/15/2020   HCT 42.1 09/15/2020   PLT 219.0 09/15/2020   GLUCOSE 91 06/19/2021   CHOL 152 04/03/2021   TRIG 152.0 (H) 04/03/2021   HDL 46.50 04/03/2021   LDLDIRECT 178.4 11/04/2013   LDLCALC 75 04/03/2021   ALT 17 04/03/2021   AST 18 04/03/2021   NA 134 (L) 06/19/2021   K 4.7 06/19/2021   CL 103 06/19/2021   CREATININE 1.18 06/19/2021   BUN 25 (H) 06/19/2021   CO2 20 06/19/2021   TSH 3.19 04/03/2021   HGBA1C 6.6 (H) 04/03/2021   MICROALBUR <0.7 04/05/2021    MR BRAIN WO CONTRAST  Result Date: 05/01/2021 CLINICAL DATA:  Years music when trying to sleep EXAM: MRI HEAD WITHOUT CONTRAST TECHNIQUE: Multiplanar, multiecho pulse sequences of the brain and surrounding structures were obtained without intravenous contrast. COMPARISON:  10/12/2004 brain MRI report. FINDINGS: Brain: No acute or subacute infarction, hemorrhage, hydrocephalus, extra-axial collection or mass lesion. Mild for age cerebral volume loss and chronic small vessel ischemic change. Thin section images through the temporal bones show no retrocochlear mass or nerve root thickening. Vascular: Normal flow voids Skull and upper cervical spine: Normal marrow signal Sinuses/Orbits: Bilateral cataract resection IMPRESSION: Unremarkable brain MRI for age. Electronically Signed   By: Monte Fantasia M.D.   On: 05/01/2021 19:23       Assessment & Plan:   Problem List Items Addressed This Visit     Aortic atherosclerosis ([place])    Continue lipitor and aspirin.        Auditory hallucinations    Saw neurology.  MRI -  unremarkable.  Labs obtained and reviewed.  Discussed Charles Bonnett syndrome.  Follow.       CKD (chronic kidney disease) stage 3, GFR 30-59 ml/min (HCC)    GFR 40.  Avoid antiinflammatories.  Continue to stay hydrated.  Follow metabolic panel.       Hypercholesterolemia    On lipitor.  Low cholesterol diet and exercise.  Follow lipid panel and liver function tests.       Hypertension    Blood pressure doing well on accupril and metoprolol.  Taking name brand ace inhibitor.  Works better for her.  feels bp better controlled.  Follow pressures.  Follow metabolic panel.       Stress    Increased stress as outlined.  Discussed.  Does not feel needs any further intervention.  Follow.       SVT (supraventricular tachycardia) ([place])    Followed by cardiology.  Continue lopressor.  Overall stable.  Walking treadmill.  Follow.       Type 2 diabetes mellitus with hyperglycemia (HCC)    Low carb diet and exercise given elevated blood glucose. Follow met b and a1c.       Other Visit Diagnoses     Need for immunization against influenza    -  Primary   Relevant Orders   Flu Vaccine QUAD High Dose(Fluad) (Completed)        Einar Pheasant, MD

## 2021-09-28 ENCOUNTER — Telehealth: Payer: Self-pay | Admitting: Internal Medicine

## 2021-09-28 DIAGNOSIS — E1165 Type 2 diabetes mellitus with hyperglycemia: Secondary | ICD-10-CM

## 2021-09-28 DIAGNOSIS — E7800 Pure hypercholesterolemia, unspecified: Secondary | ICD-10-CM

## 2021-09-28 DIAGNOSIS — I10 Essential (primary) hypertension: Secondary | ICD-10-CM

## 2021-09-28 NOTE — Telephone Encounter (Signed)
Order placed for labs.

## 2021-09-28 NOTE — Addendum Note (Signed)
Addended by: Charm Barges on: 09/28/2021 07:52 PM   Modules accepted: Orders

## 2021-09-28 NOTE — Telephone Encounter (Signed)
Pt has a lab appt on 10/05/2021, there are no orders in yet.

## 2021-09-30 ENCOUNTER — Encounter: Payer: Self-pay | Admitting: Internal Medicine

## 2021-09-30 NOTE — Assessment & Plan Note (Signed)
Increased stress as outlined.  Discussed.  Does not feel needs any further intervention.  Follow.  

## 2021-09-30 NOTE — Assessment & Plan Note (Signed)
Blood pressure doing well on accupril and metoprolol.  Taking name brand ace inhibitor.  Works better for her.  feels bp better controlled.  Follow pressures.  Follow metabolic panel.  

## 2021-09-30 NOTE — Assessment & Plan Note (Signed)
Low carb diet and exercise given elevated blood glucose. Follow met b and a1c.  

## 2021-09-30 NOTE — Assessment & Plan Note (Signed)
GFR 40.  Avoid antiinflammatories.  Continue to stay hydrated.  Follow metabolic panel.

## 2021-09-30 NOTE — Assessment & Plan Note (Signed)
Followed by cardiology.  Continue lopressor.  Overall stable.  Walking treadmill.  Follow.  

## 2021-09-30 NOTE — Assessment & Plan Note (Signed)
Continue lipitor and aspirin.  ?

## 2021-09-30 NOTE — Assessment & Plan Note (Signed)
On lipitor.  Low cholesterol diet and exercise.  Follow lipid panel and liver function tests.   

## 2021-09-30 NOTE — Assessment & Plan Note (Signed)
Saw neurology.  MRI - unremarkable.  Labs obtained and reviewed.  Discussed Charles Bonnett syndrome.  Follow.  

## 2021-10-05 ENCOUNTER — Other Ambulatory Visit: Payer: Self-pay

## 2021-10-05 ENCOUNTER — Other Ambulatory Visit (INDEPENDENT_AMBULATORY_CARE_PROVIDER_SITE_OTHER): Payer: Medicare Other

## 2021-10-05 DIAGNOSIS — E1165 Type 2 diabetes mellitus with hyperglycemia: Secondary | ICD-10-CM

## 2021-10-05 DIAGNOSIS — E7800 Pure hypercholesterolemia, unspecified: Secondary | ICD-10-CM

## 2021-10-05 LAB — HEPATIC FUNCTION PANEL
ALT: 16 U/L (ref 0–35)
AST: 19 U/L (ref 0–37)
Albumin: 4.2 g/dL (ref 3.5–5.2)
Alkaline Phosphatase: 57 U/L (ref 39–117)
Bilirubin, Direct: 0.1 mg/dL (ref 0.0–0.3)
Total Bilirubin: 0.5 mg/dL (ref 0.2–1.2)
Total Protein: 6.6 g/dL (ref 6.0–8.3)

## 2021-10-05 LAB — BASIC METABOLIC PANEL
BUN: 26 mg/dL — ABNORMAL HIGH (ref 6–23)
CO2: 25 mEq/L (ref 19–32)
Calcium: 9.1 mg/dL (ref 8.4–10.5)
Chloride: 106 mEq/L (ref 96–112)
Creatinine, Ser: 1.13 mg/dL (ref 0.40–1.20)
GFR: 42.18 mL/min — ABNORMAL LOW (ref >60.00)
Glucose, Bld: 99 mg/dL (ref 70–99)
Potassium: 4.3 mEq/L (ref 3.5–5.1)
Sodium: 138 mEq/L (ref 135–145)

## 2021-10-05 LAB — LIPID PANEL
Cholesterol: 158 mg/dL (ref 0–200)
HDL: 51.4 mg/dL (ref >39.00)
LDL Cholesterol: 78 mg/dL (ref 0–99)
NonHDL: 106.14
Total CHOL/HDL Ratio: 3
Triglycerides: 139 mg/dL (ref 0.0–149.0)
VLDL: 27.8 mg/dL (ref 0.0–40.0)

## 2021-10-05 LAB — HEMOGLOBIN A1C: Hgb A1c MFr Bld: 6.7 % — ABNORMAL HIGH (ref 4.6–6.5)

## 2021-10-25 ENCOUNTER — Other Ambulatory Visit: Payer: Self-pay

## 2021-10-25 ENCOUNTER — Encounter: Payer: Self-pay | Admitting: *Deleted

## 2021-10-25 ENCOUNTER — Emergency Department: Payer: Medicare Other

## 2021-10-25 DIAGNOSIS — D72829 Elevated white blood cell count, unspecified: Secondary | ICD-10-CM | POA: Diagnosis not present

## 2021-10-25 DIAGNOSIS — I129 Hypertensive chronic kidney disease with stage 1 through stage 4 chronic kidney disease, or unspecified chronic kidney disease: Secondary | ICD-10-CM | POA: Diagnosis not present

## 2021-10-25 DIAGNOSIS — Z20822 Contact with and (suspected) exposure to covid-19: Secondary | ICD-10-CM | POA: Insufficient documentation

## 2021-10-25 DIAGNOSIS — N1831 Chronic kidney disease, stage 3a: Secondary | ICD-10-CM | POA: Diagnosis not present

## 2021-10-25 DIAGNOSIS — Z7982 Long term (current) use of aspirin: Secondary | ICD-10-CM | POA: Insufficient documentation

## 2021-10-25 DIAGNOSIS — R7401 Elevation of levels of liver transaminase levels: Secondary | ICD-10-CM | POA: Insufficient documentation

## 2021-10-25 DIAGNOSIS — E7800 Pure hypercholesterolemia, unspecified: Secondary | ICD-10-CM | POA: Diagnosis not present

## 2021-10-25 DIAGNOSIS — R42 Dizziness and giddiness: Principal | ICD-10-CM | POA: Insufficient documentation

## 2021-10-25 DIAGNOSIS — Z79899 Other long term (current) drug therapy: Secondary | ICD-10-CM | POA: Insufficient documentation

## 2021-10-25 DIAGNOSIS — E1122 Type 2 diabetes mellitus with diabetic chronic kidney disease: Secondary | ICD-10-CM | POA: Diagnosis not present

## 2021-10-25 DIAGNOSIS — R112 Nausea with vomiting, unspecified: Secondary | ICD-10-CM | POA: Insufficient documentation

## 2021-10-25 DIAGNOSIS — R197 Diarrhea, unspecified: Secondary | ICD-10-CM | POA: Diagnosis not present

## 2021-10-25 LAB — BASIC METABOLIC PANEL
Anion gap: 14 (ref 5–15)
BUN: 31 mg/dL — ABNORMAL HIGH (ref 8–23)
CO2: 16 mmol/L — ABNORMAL LOW (ref 22–32)
Calcium: 9.1 mg/dL (ref 8.9–10.3)
Chloride: 105 mmol/L (ref 98–111)
Creatinine, Ser: 1.14 mg/dL — ABNORMAL HIGH (ref 0.44–1.00)
GFR, Estimated: 45 mL/min — ABNORMAL LOW (ref >60)
Glucose, Bld: 133 mg/dL — ABNORMAL HIGH (ref 70–99)
Potassium: 3.9 mmol/L (ref 3.5–5.1)
Sodium: 135 mmol/L (ref 135–145)

## 2021-10-25 LAB — CBC
HCT: 44.4 % (ref 36.0–46.0)
Hemoglobin: 14.3 g/dL (ref 12.0–15.0)
MCH: 30.2 pg (ref 26.0–34.0)
MCHC: 32.2 g/dL (ref 30.0–36.0)
MCV: 93.9 fL (ref 80.0–100.0)
Platelets: 266 10*3/uL (ref 150–400)
RBC: 4.73 MIL/uL (ref 3.87–5.11)
RDW: 13.7 % (ref 11.5–15.5)
WBC: 15.2 10*3/uL — ABNORMAL HIGH (ref 4.0–10.5)
nRBC: 0 % (ref 0.0–0.2)

## 2021-10-25 LAB — TROPONIN I (HIGH SENSITIVITY): Troponin I (High Sensitivity): 7 ng/L (ref <18)

## 2021-10-25 MED ORDER — ONDANSETRON 4 MG PO TBDP: 4.0000 mg | ORAL_TABLET | Freq: Once | ORAL | Status: AC

## 2021-10-25 MED ORDER — ONDANSETRON 4 MG PO TBDP
ORAL_TABLET | ORAL | Status: AC
  Administered 2021-10-25: 4 mg via ORAL
  Filled 2021-10-25: qty 1

## 2021-10-25 NOTE — ED Triage Notes (Signed)
Pt brought in via ems.  Pt has dizziness and nausea.  Hx vertigo pt took meds without relief.  No chest pain or sob.  Pt alert.

## 2021-10-25 NOTE — ED Provider Notes (Signed)
Emergency Medicine Provider Triage Evaluation Note  Elizabeth [name] , a 87 y.o. female  was evaluated in triage.  Pt complains of dizziness, nausea, vomiting that began earlier today.  No relief Dramamine, Zyrtec.  Patient states she has a history of vertigo.  Currently having severe dizziness even at rest and without movement.  She denies any chest pain or shortness of breath.  No headaches, fevers  Review of Systems  Positive: Dizziness, no nausea, vomiting Negative: Chest pain, shortness of breath, fevers  Physical Exam  BP (!) 149/74 (BP Location: Right Arm)   Pulse 73   Resp 20   Ht 5\' 1"  (1.549 m)   Wt 63.5 kg   SpO2 96%   BMI 26.45 kg/m  Gen:   Awake, no distress dry heaving.  Presents in a wheelchair.  Alert.  Hard of hearing Resp:  Normal effort  MSK:   Moves extremities without difficulty  Other:    Medical Decision Making  Medically screening exam initiated at 8:21 PM.  Appropriate orders placed.  [name] was informed that the remainder of the evaluation will be completed by another provider, this initial triage assessment does not replace that evaluation, and the importance of remaining in the ED until their evaluation is complete.  87 year old female with dizziness, nausea/vomiting.  Patient given Zofran in triage.  Will order labs and imaging.   99, PA-C 10/25/21 2024    2025, MD 10/26/21 [phone]

## 2021-10-26 ENCOUNTER — Emergency Department: Payer: Medicare Other

## 2021-10-26 ENCOUNTER — Encounter: Payer: Self-pay | Admitting: Internal Medicine

## 2021-10-26 ENCOUNTER — Observation Stay
Admission: EM | Admit: 2021-10-26 | Discharge: 2021-10-29 | Disposition: A | Discharge status: Home / Self Care | Payer: Medicare Other | Attending: Internal Medicine | Admitting: Internal Medicine

## 2021-10-26 DIAGNOSIS — I10 Essential (primary) hypertension: Secondary | ICD-10-CM | POA: Diagnosis present

## 2021-10-26 DIAGNOSIS — R42 Dizziness and giddiness: Secondary | ICD-10-CM

## 2021-10-26 DIAGNOSIS — E7800 Pure hypercholesterolemia, unspecified: Secondary | ICD-10-CM | POA: Diagnosis present

## 2021-10-26 DIAGNOSIS — N1831 Chronic kidney disease, stage 3a: Secondary | ICD-10-CM | POA: Diagnosis not present

## 2021-10-26 DIAGNOSIS — R112 Nausea with vomiting, unspecified: Secondary | ICD-10-CM | POA: Diagnosis present

## 2021-10-26 DIAGNOSIS — R197 Diarrhea, unspecified: Secondary | ICD-10-CM | POA: Diagnosis present

## 2021-10-26 DIAGNOSIS — R778 Other specified abnormalities of plasma proteins: Secondary | ICD-10-CM

## 2021-10-26 DIAGNOSIS — R7989 Other specified abnormal findings of blood chemistry: Secondary | ICD-10-CM | POA: Diagnosis present

## 2021-10-26 DIAGNOSIS — D72829 Elevated white blood cell count, unspecified: Secondary | ICD-10-CM | POA: Diagnosis present

## 2021-10-26 DIAGNOSIS — N183 Chronic kidney disease, stage 3 unspecified: Secondary | ICD-10-CM | POA: Diagnosis present

## 2021-10-26 LAB — URINALYSIS, COMPLETE (UACMP) WITH MICROSCOPIC
Bacteria, UA: NONE SEEN
Bilirubin Urine: NEGATIVE
Glucose, UA: NEGATIVE mg/dL
Hgb urine dipstick: NEGATIVE
Ketones, ur: NEGATIVE mg/dL
Nitrite: NEGATIVE
Protein, ur: 30 mg/dL — AB
Specific Gravity, Urine: 1.014 (ref 1.005–1.030)
pH: 5 (ref 5.0–8.0)

## 2021-10-26 LAB — HEPATIC FUNCTION PANEL
ALT: 26 U/L (ref 0–44)
AST: 36 U/L (ref 15–41)
Albumin: 4.7 g/dL (ref 3.5–5.0)
Alkaline Phosphatase: 61 U/L (ref 38–126)
Bilirubin, Direct: <0.1 mg/dL (ref 0.0–0.2)
Total Bilirubin: 0.7 mg/dL (ref 0.3–1.2)
Total Protein: 7.7 g/dL (ref 6.5–8.1)

## 2021-10-26 LAB — TROPONIN I (HIGH SENSITIVITY)
Troponin I (High Sensitivity): 20 ng/L — ABNORMAL HIGH (ref <18)
Troponin I (High Sensitivity): 36 ng/L — ABNORMAL HIGH (ref <18)
Troponin I (High Sensitivity): 29 ng/L — ABNORMAL HIGH (ref <18)
Troponin I (High Sensitivity): 26 ng/L — ABNORMAL HIGH (ref <18)

## 2021-10-26 LAB — RESP PANEL BY RT-PCR (FLU A&B, COVID) ARPGX2
Influenza A by PCR: NEGATIVE
Influenza B by PCR: NEGATIVE
SARS Coronavirus 2 by RT PCR: NEGATIVE

## 2021-10-26 LAB — PROCALCITONIN: Procalcitonin: <0.1 ng/mL

## 2021-10-26 LAB — BRAIN NATRIURETIC PEPTIDE: B Natriuretic Peptide: 521.6 pg/mL — ABNORMAL HIGH (ref 0.0–100.0)

## 2021-10-26 LAB — LACTIC ACID, PLASMA: Lactic Acid, Venous: 3.5 mmol/L (ref 0.5–1.9)

## 2021-10-26 LAB — HEMOGLOBIN A1C
Hgb A1c MFr Bld: 6.4 % — ABNORMAL HIGH (ref 4.8–5.6)
Mean Plasma Glucose: 136.98 mg/dL

## 2021-10-26 LAB — LIPASE, BLOOD: Lipase: 24 U/L (ref 11–51)

## 2021-10-26 MED ORDER — ATORVASTATIN CALCIUM 20 MG PO TABS
40.0000 mg | ORAL_TABLET | Freq: Every day | ORAL | Status: DC
  Administered 2021-10-26 – 2021-10-28 (×3): 40 mg via ORAL
  Filled 2021-10-26 (×3): qty 2

## 2021-10-26 MED ORDER — ASPIRIN 81 MG PO CHEW
324.0000 mg | CHEWABLE_TABLET | Freq: Once | ORAL | Status: AC
  Administered 2021-10-26: 324 mg via ORAL
  Filled 2021-10-26: qty 4

## 2021-10-26 MED ORDER — LACTATED RINGERS IV BOLUS
1000.0000 mL | Freq: Once | INTRAVENOUS | Status: AC
  Administered 2021-10-26: 1000 mL via INTRAVENOUS

## 2021-10-26 MED ORDER — ACETAMINOPHEN 325 MG PO TABS: 650.0000 mg | ORAL_TABLET | Freq: Four times a day (QID) | ORAL | Status: DC | PRN

## 2021-10-26 MED ORDER — LACTATED RINGERS IV SOLN: INTRAVENOUS | Status: DC

## 2021-10-26 MED ORDER — MECLIZINE HCL 25 MG PO TABS
25.0000 mg | ORAL_TABLET | Freq: Three times a day (TID) | ORAL | Status: DC | PRN
  Administered 2021-10-27: 12:00:00 25 mg via ORAL
  Filled 2021-10-26 (×2): qty 1

## 2021-10-26 MED ORDER — ASPIRIN 81 MG PO CHEW
81.0000 mg | CHEWABLE_TABLET | Freq: Every day | ORAL | Status: DC
  Administered 2021-10-28 – 2021-10-29 (×2): 81 mg via ORAL
  Filled 2021-10-26 (×3): qty 1

## 2021-10-26 MED ORDER — QUINAPRIL HCL 10 MG PO TABS
40.0000 mg | ORAL_TABLET | Freq: Every day | ORAL | Status: DC
  Administered 2021-10-28 – 2021-10-29 (×2): 40 mg via ORAL
  Filled 2021-10-26 (×4): qty 4

## 2021-10-26 MED ORDER — LORATADINE 10 MG PO TABS
10.0000 mg | ORAL_TABLET | Freq: Every day | ORAL | Status: DC
  Administered 2021-10-28 – 2021-10-29 (×2): 10 mg via ORAL
  Filled 2021-10-26 (×3): qty 1

## 2021-10-26 MED ORDER — LACTATED RINGERS IV BOLUS: 500.0000 mL | Freq: Once | INTRAVENOUS | Status: DC

## 2021-10-26 MED ORDER — METOPROLOL TARTRATE 25 MG PO TABS: 25.0000 mg | ORAL_TABLET | Freq: Two times a day (BID) | ORAL | Status: DC

## 2021-10-26 MED ORDER — ENOXAPARIN SODIUM 30 MG/0.3ML IJ SOSY
30.0000 mg | PREFILLED_SYRINGE | INTRAMUSCULAR | Status: DC
  Administered 2021-10-26 – 2021-10-28 (×3): 30 mg via SUBCUTANEOUS
  Filled 2021-10-26 (×3): qty 0.3

## 2021-10-26 MED ORDER — ENOXAPARIN SODIUM 40 MG/0.4ML IJ SOSY: 40.0000 mg | PREFILLED_SYRINGE | INTRAMUSCULAR | Status: DC

## 2021-10-26 MED ORDER — ONDANSETRON HCL 4 MG/2ML IJ SOLN
4.0000 mg | Freq: Once | INTRAMUSCULAR | Status: AC
  Administered 2021-10-26: 4 mg via INTRAVENOUS
  Filled 2021-10-26: qty 2

## 2021-10-26 MED ORDER — ONDANSETRON HCL 4 MG/2ML IJ SOLN
4.0000 mg | Freq: Three times a day (TID) | INTRAMUSCULAR | Status: DC | PRN
  Administered 2021-10-27: 10:00:00 4 mg via INTRAVENOUS
  Filled 2021-10-26: qty 2

## 2021-10-26 MED ORDER — METOPROLOL TARTRATE 25 MG PO TABS
25.0000 mg | ORAL_TABLET | Freq: Two times a day (BID) | ORAL | Status: DC
  Administered 2021-10-26 – 2021-10-29 (×5): 25 mg via ORAL
  Filled 2021-10-26 (×6): qty 1

## 2021-10-26 MED ORDER — MECLIZINE HCL 25 MG PO TABS
25.0000 mg | ORAL_TABLET | Freq: Once | ORAL | Status: AC
  Administered 2021-10-26: 25 mg via ORAL
  Filled 2021-10-26: qty 1

## 2021-10-26 MED ORDER — HYDRALAZINE HCL 20 MG/ML IJ SOLN
5.0000 mg | INTRAMUSCULAR | Status: DC | PRN
  Administered 2021-10-27: 13:00:00 5 mg via INTRAVENOUS
  Filled 2021-10-26: qty 1

## 2021-10-26 NOTE — ED Provider Notes (Signed)
[facility] Emergency Department Provider Note  ____________________________________________  Time seen: Approximately 3:54 AM  I have reviewed the triage vital signs and the nursing notes.   HISTORY  Chief Complaint Dizziness   HPI Elizabeth [name] is a 87 y.o. female with a history of SVT, CKD, diabetes, hypertension, hyperlipidemia, vertigo who presents for evaluation of dizziness.  Patient reports that she has struggled with vertigo a lot when she was younger.  Has not really been a problem for her over the last 10 to 15 years.  2 days ago she reports that she woke up with slight symptoms of vertigo.  She took an antihistamine and Zyrtec and went to bed.  She woke up today feeling better but around 4 PM she reports that the symptoms just came back very severe.  She was complaining of room spinning sensation, inability to walk or stand, severe nausea and vomiting.  She reports that her symptoms have improved significantly but she still feels off balance and is worse with movement.  She has had a little bit of a congestion and a frontal throbbing headache that she describes as mild.  She denies cough, sore throat, fever, chest pain, shortness of breath, diplopia, dysarthria, dysphagia, unilateral weakness or numbness.  She denies any prior history of stroke.   Past Medical History:  Diagnosis Date   Cystocele or rectocele with incomplete uterine prolapse    Hyperlipidemia    Hypertension    Irritable bowel syndrome    Palpitations    Vaginal atrophy     Patient Active Problem List   Diagnosis Date Noted   Aortic atherosclerosis (HCC) 06/26/2021   Auditory hallucinations 04/10/2021   Right shoulder pain 10/02/2020   CKD (chronic kidney disease) stage 3, GFR 30-59 ml/min (HCC) 10/02/2020   Elevated TSH 10/02/2020   Weakness 05/29/2020   Type 2 diabetes mellitus with hyperglycemia (HCC) 01/07/2020   GERD (gastroesophageal reflux disease) 11/09/2018    Dizziness 06/02/2017   Vaginal atrophy 09/27/2016   SVT (supraventricular tachycardia) (HCC) 01/27/2016   Headache 01/26/2016   Left arm numbness 01/26/2016   Urethral caruncle 12/23/2015   Vaginal pessary in situ 09/22/2015   Health care maintenance 07/22/2015   Baden-Walker grade 4 cystocele 06/26/2015   First degree uterine prolapse 06/26/2015   Hyperkalemia 04/27/2015   Arrhythmia 04/27/2015   SOB (shortness of breath) 04/27/2015   Double vision 07/18/2014   Decreased hearing 07/18/2014   Stress 07/18/2014   Fatigue 07/18/2014   Environmental allergies 07/09/2013   Hypertension 12/09/2012   Hypercholesterolemia 12/09/2012    Past Surgical History:  Procedure Laterality Date   CATARACT EXTRACTION  2006    Prior to Admission medications   Medication Sig Start Date End Date Taking? Authorizing Provider  aspirin 81 MG tablet Take 81 mg by mouth daily.    [provider]  cetirizine (ZYRTEC) 5 MG tablet Take 5 mg by mouth daily as needed.     [provider]  estradiol (ESTRACE) 0.1 MG/GM vaginal cream PLACE 1 APPLICATORFUL VAGINALLY 2 TIMES A WEEK 04/28/19   Hildred Laserherry, Anika, MD  hydrochlorothiazide (HYDRODIURIL) 25 MG tablet Take 0.5 tablets (12.5 mg total) by mouth daily. 08/07/21   Antonieta IbaGollan, Timothy J, MD  LIPITOR 40 MG tablet Take 1 tablet (40 mg total) by mouth daily. 08/07/21   Antonieta IbaGollan, Timothy J, MD  magnesium oxide (MAG-OX) 400 (241.3 Mg) MG tablet TAKE 1 TABLET BY MOUTH ONCE DAILY 08/07/19   Glori LuisSonnenberg, Eric G, MD  metoprolol  tartrate (LOPRESSOR) 25 MG tablet Take 1 tablet (25 mg total) by mouth 2 (two) times daily. 08/07/21   Minna Merritts, MD  quinapril (ACCUPRIL) 40 MG tablet Take 1 tablet (40 mg total) by mouth daily. 08/07/21   Minna Merritts, MD  triamcinolone (NASACORT) 55 MCG/ACT nasal inhaler Place 2 sprays into the nose daily as needed.     [provider]    Allergies Patient has no known allergies.  Family History  Problem Relation  Age of Onset   Transient ischemic attack Mother        multiple    Dementia Mother    Stroke Father    Heart disease Brother        MI - died age 53   Leukemia Brother    Lung cancer Brother    Throat cancer Brother        had heart disease also   Heart disease Sister    Dementia Sister     Social History Social History   Tobacco Use   Smoking status: Never   Smokeless tobacco: Never  Vaping Use   Vaping Use: Never used  Substance Use Topics   Alcohol use: No    Alcohol/week: 0.0 standard drinks   Drug use: No    Review of Systems  Constitutional: Negative for fever. + dizziness Eyes: Negative for visual changes. ENT: Negative for sore throat. + congestion Neck: No neck pain  Cardiovascular: Negative for chest pain. Respiratory: Negative for shortness of breath. Gastrointestinal: Negative for abdominal pain, vomiting or diarrhea. Genitourinary: Negative for dysuria. Musculoskeletal: Negative for back pain. Skin: Negative for rash. Neurological: Negative for weakness or numbness. + HA Psych: No SI or HI  ____________________________________________   PHYSICAL EXAM:  VITAL SIGNS: ED Triage Vitals  Enc Vitals Group     BP 10/25/21 2005 (!) 149/74     Pulse Rate 10/25/21 2005 73     Resp 10/25/21 2005 20     Temp 10/26/21 0013 97.7 F (36.5 C)     Temp Source 10/26/21 0013 Oral     SpO2 10/25/21 2005 96 %     Weight 10/25/21 2005 140 lb (63.5 kg)     Height 10/25/21 2005 5\' 1"  (1.549 m)     Head Circumference --      Peak Flow --      Pain Score 10/25/21 2005 0     Pain Loc --      Pain Edu? --      Excl. in [place]? --     Constitutional: Alert and oriented. Well appearing and in no apparent distress. HEENT:      Head: Normocephalic and atraumatic.         Eyes: Conjunctivae are normal. Sclera is non-icteric.       Mouth/Throat: Mucous membranes are moist.       Neck: Supple with no signs of meningismus.  No bruits Cardiovascular: Regular rate and  rhythm. No murmurs, gallops, or rubs. 2+ symmetrical distal pulses are present in all extremities. No JVD. Respiratory: Normal respiratory effort. Lungs are clear to auscultation bilaterally.  Gastrointestinal: Soft, non tender, and non distended with positive bowel sounds. No rebound or guarding. Genitourinary: No CVA tenderness. Musculoskeletal:  No edema, cyanosis, or erythema of extremities. Neurologic: Face is symmetric, normal speech, PERRL, no nystagmus, EOMI, back strength and sensation x4, no dysmetria, no pronator drift Skin: Skin is warm, dry and intact. No rash noted. Psychiatric: Mood and affect are normal.  Speech and behavior are normal.  ____________________________________________   LABS (all labs ordered are listed, but only abnormal results are displayed)  Labs Reviewed  BASIC METABOLIC PANEL - Abnormal; Notable for the following components:      Result Value   CO2 16 (*)    Glucose, Bld 133 (*)    BUN 31 (*)    Creatinine, Ser 1.14 (*)    GFR, Estimated 45 (*)    All other components within normal limits  CBC - Abnormal; Notable for the following components:   WBC 15.2 (*)    All other components within normal limits  TROPONIN I (HIGH SENSITIVITY) - Abnormal; Notable for the following components:   Troponin I (High Sensitivity) 20 (*)    All other components within normal limits  RESP PANEL BY RT-PCR (FLU A&B, COVID) ARPGX2  HEPATIC FUNCTION PANEL  LIPASE, BLOOD  URINALYSIS, COMPLETE (UACMP) WITH MICROSCOPIC  TROPONIN I (HIGH SENSITIVITY)   ____________________________________________  EKG  ED ECG REPORT I, Nita Sickle, the attending physician, personally viewed and interpreted this ECG.  Normal sinus rhythm, normal intervals, normal axis, no STE or depressions, no evidence of HOCM, AV block, delta wave, ARVD, prolonged QTc, WPW, or Brug[place].   ____________________________________________  RADIOLOGY  I have personally reviewed the images  performed during this visit and I agree with the Radiologist's read.   Interpretation by Radiologist:  DG Chest 2 View  Result Date: 10/25/2021 CLINICAL DATA:  Dizziness EXAM: CHEST - 2 VIEW COMPARISON:  07/31/2019 FINDINGS: The heart size and mediastinal contours are within normal limits. Both lungs are clear. The visualized skeletal structures are unremarkable. IMPRESSION: No active cardiopulmonary disease. Electronically Signed   By: Helyn Numbers M.D.   On: 10/25/2021 20:29   CT Head Wo Contrast  Result Date: 10/26/2021 CLINICAL DATA:  Dizziness. EXAM: CT HEAD WITHOUT CONTRAST TECHNIQUE: Contiguous axial images were obtained from the base of the skull through the vertex without intravenous contrast. COMPARISON:  None. FINDINGS: Brain: There is mild cerebral atrophy with widening of the extra-axial spaces and ventricular dilatation. There are areas of decreased attenuation within the white matter tracts of the supratentorial brain, consistent with microvascular disease changes. Vascular: No hyperdense vessel or unexpected calcification. Skull: Normal. Negative for fracture or focal lesion. Sinuses/Orbits: No acute finding. Other: None. IMPRESSION: 1. Generalized cerebral atrophy. 2. No acute intracranial abnormality. Electronically Signed   By: Aram Candela M.D.   On: 10/26/2021 00:09     ____________________________________________   PROCEDURES  Procedure(s) performed:yes .1-3 Lead EKG Interpretation Performed by: Nita Sickle, MD Authorized by: Nita Sickle, MD     Interpretation: non-specific     ECG rate assessment: normal     Rhythm: sinus rhythm     Ectopy: none     Conduction: normal     Critical Care performed: yes  CRITICAL CARE Performed by: Nita Sickle  ?  Total critical care time: 30 min  Critical care time was exclusive of separately billable procedures and treating other patients.  Critical care was necessary to treat or prevent  imminent or life-threatening deterioration.  Critical care was time spent personally by me on the following activities: development of treatment plan with patient and/or surrogate as well as nursing, discussions with consultants, evaluation of patient's response to treatment, examination of patient, obtaining history from patient or surrogate, ordering and performing treatments and interventions, ordering and review of laboratory studies, ordering and review of radiographic studies, pulse oximetry and re-evaluation of patient's condition.  ____________________________________________  INITIAL IMPRESSION / ASSESSMENT AND PLAN / ED COURSE  87 y.o. female with a history of SVT, CKD, diabetes, hypertension, hyperlipidemia, vertigo who presents for evaluation of dizziness.  Patient reports that her symptoms were identical to her prior episodes of vertigo however much more severe.  She has had some congestion and a frontal headache which could be the reason why she had such bad vertigo.  At this time she is neurologically intact, her vitals are within normal limits, she has very minimal symptoms.  Head CT visualized by me with no acute findings.  EKG with no signs of ischemia or dysrhythmias.  Ddx vertigo, CVA, dysrhythmias, head bleed, dehydration, infection  Labs showing mildly elevated white count of 15.2 really nonspecific at this time.  Initial troponin was negative however the repeat troponin is now positive at 20 make me concerned for possible dysrhythmia as etiology of patient's symptoms.  No signs of dehydration or AKI, no significant electrolyte derangements.  Due to elevated white count and congestion we will get a COVID and flu swabs.  We will also get a urinalysis.  Will give IV fluids, meclizine, connect patient to telemetry for any signs of dysrhythmias and discussed with the hospitalist for admission for further monitoring.  History gathered from patient and her son was at bedside.  Plan  discussed with both of them.  Old medical records review including patient's most recent cardiology's notes from Dr. Rockey Situ.  Will get an MRI       _____________________________________________ Please note:  Patient was evaluated in Emergency Department today for the symptoms described in the history of present illness. Patient was evaluated in the context of the global COVID-19 pandemic, which necessitated consideration that the patient might be at risk for infection with the SARS-CoV-2 virus that causes COVID-19. Institutional protocols and algorithms that pertain to the evaluation of patients at risk for COVID-19 are in a state of rapid change based on information released by regulatory bodies including the CDC and federal and state organizations. These policies and algorithms were followed during the patient's care in the ED.  Some ED evaluations and interventions may be delayed as a result of limited staffing during the pandemic.   [place] Controlled Substance Database was reviewed by me. ____________________________________________   FINAL CLINICAL IMPRESSION(S) / ED DIAGNOSES   Final diagnoses:  Vertigo  Elevated troponin      NEW MEDICATIONS STARTED DURING THIS VISIT:  ED Discharge Orders     None        Note:  This document was prepared using Dragon voice recognition software and may include unintentional dictation errors.    Rudene Re, MD 10/26/21 [phone]

## 2021-10-26 NOTE — H&P (Signed)
History and Physical    Elizabeth [name] ZOX:[id number] DOB: 08/31/1927 DOA: 10/26/2021  Referring MD/NP/PA:   PCP: Dale DurhamScott, Charlene, MD   Patient coming from:  The patient is coming from home.          Chief Complaint: vertigo  HPI: Elizabeth [name] is a 87 y.o. female with medical history significant of hypertension, hyperlipidemia, diet-controlled diabetes, GERD, vertigo, SVT, IBS, cystocele, CKD-3, who presents with vertigo.  Per her son at the bedside, patient started having vertigo symptoms since yesterday morning.  She has dizziness, feeling room spinning around her.  Associated with nausea and multiple episodes of nonbilious nonbloody vomiting, mostly dry heaves.  Patient also has several times of diarrhea.  No abdominal pain.  No unilateral numbness or tingling to extremities.  No facial droop or slurred speech.  No symptoms of UTI.  ED Course: pt was found to have WBC 15.2, BNP 521, troponin level 7, 20, 36, lactic acid 3.5, negative COVID PCR, urinalysis negative except for trace amount of leukocyte, stable renal function, temperature normal, blood pressure 195/79, 125/74, heart rate 62, RR 20, oxygen saturation 91-98% on room air.  CT of head is negative for acute intracranial abnormalities.  MRI of brain is negative for stroke.  Chest x-ray is negative for infiltration.  Patient is placed on progressive bed for observation.  Review of Systems:   General: no fevers, chills, no body weight gain, has fatigue HEENT: no blurry vision, hearing changes or sore throat Respiratory: no dyspnea, coughing, wheezing CV: no chest pain, no palpitations GI: has nausea, vomiting,  diarrhea, no constipation, abdominal pain, GU: no dysuria, burning on urination, increased urinary frequency, hematuria  Ext: has trace leg edema Neuro: no unilateral weakness, numbness, or tingling, no vision change or hearing loss. Has vertigo Skin: no rash, no skin tear. MSK: No muscle spasm, no deformity, no  limitation of range of movement in spin Heme: No easy bruising.  Travel history: No recent long distant travel.  Allergy: No Known Allergies  Past Medical History:  Diagnosis Date   Cystocele or rectocele with incomplete uterine prolapse    Hyperlipidemia    Hypertension    Irritable bowel syndrome    Palpitations    Vaginal atrophy     Past Surgical History:  Procedure Laterality Date   CATARACT EXTRACTION  2006    Social History:  reports that she has never smoked. She has never used smokeless tobacco. She reports that she does not drink alcohol and does not use drugs.  Family History:  Family History  Problem Relation Age of Onset   Transient ischemic attack Mother        multiple    Dementia Mother    Stroke Father    Heart disease Brother        MI - died age 85   Leukemia Brother    Lung cancer Brother    Throat cancer Brother        had heart disease also   Heart disease Sister    Dementia Sister      Prior to Admission medications   Medication Sig Start Date End Date Taking? Authorizing Provider  aspirin 81 MG tablet Take 81 mg by mouth daily.    [provider]  cetirizine (ZYRTEC) 5 MG tablet Take 5 mg by mouth daily as needed.     [provider]  estradiol (ESTRACE) 0.1 MG/GM vaginal cream PLACE 1 APPLICATORFUL VAGINALLY 2 TIMES A WEEK 04/28/19  Rubie Maid, MD  hydrochlorothiazide (HYDRODIURIL) 25 MG tablet Take 0.5 tablets (12.5 mg total) by mouth daily. 08/07/21   Minna Merritts, MD  LIPITOR 40 MG tablet Take 1 tablet (40 mg total) by mouth daily. 08/07/21   Minna Merritts, MD  magnesium oxide (MAG-OX) 400 (241.3 Mg) MG tablet TAKE 1 TABLET BY MOUTH ONCE DAILY 08/07/19   Leone Haven, MD  metoprolol tartrate (LOPRESSOR) 25 MG tablet Take 1 tablet (25 mg total) by mouth 2 (two) times daily. 08/07/21   Minna Merritts, MD  quinapril (ACCUPRIL) 40 MG tablet Take 1 tablet (40 mg total) by mouth daily. 08/07/21   Minna Merritts, MD  triamcinolone (NASACORT) 55 MCG/ACT nasal inhaler Place 2 sprays into the nose daily as needed.     [provider]    Physical Exam: Vitals:   10/26/21 0600 10/26/21 0630 10/26/21 1216 10/26/21 1418  BP: (!) 120/58 125/74 125/68 (!) 121/51  Pulse: 68 62 (!) 59 65  Resp: 18 16 16 16   Temp:      TempSrc:      SpO2: 94% 91% 98% 100%  Weight:      Height:       General: Not in acute distress HEENT:       Eyes: PERRL, EOMI, no scleral icterus.       ENT: No discharge from the ears and nose, no pharynx injection, no tonsillar enlargement.        Neck: No JVD, no bruit, no mass felt. Heme: No neck lymph node enlargement. Cardiac: S1/S2, RRR, No murmurs, No gallops or rubs. Respiratory: No rales, wheezing, rhonchi or rubs. GI: Soft, nondistended, nontender, no rebound pain, no organomegaly, BS present. GU: No hematuria Ext: has trace leg edema bilaterally. 1+DP/PT pulse bilaterally. Musculoskeletal: No joint deformities, No joint redness or warmth, no limitation of ROM in spin. Skin: No rashes.  Neuro: Alert, oriented X3, cranial nerves II-XII grossly intact, moves all extremities normally.  Psych: Patient is not psychotic, no suicidal or hemocidal ideation.  Labs on Admission: I have personally reviewed following labs and imaging studies  CBC: Recent Labs  Lab 10/25/21 2007  WBC 15.2*  HGB 14.3  HCT 44.4  MCV 93.9  PLT 123456   Basic Metabolic Panel: Recent Labs  Lab 10/25/21 2007  NA 135  K 3.9  CL 105  CO2 16*  GLUCOSE 133*  BUN 31*  CREATININE 1.14*  CALCIUM 9.1   GFR: Estimated Creatinine Clearance: 26.9 mL/min (A) (by C-G formula based on SCr of 1.14 mg/dL (H)). Liver Function Tests: Recent Labs  Lab 10/26/21 0025  AST 36  ALT 26  ALKPHOS 61  BILITOT 0.7  PROT 7.7  ALBUMIN 4.7   Recent Labs  Lab 10/26/21 0025  LIPASE 24   No results for input(s): AMMONIA in the last 168 hours. Coagulation Profile: No results for input(s): INR,  PROTIME in the last 168 hours. Cardiac Enzymes: No results for input(s): CKTOTAL, CKMB, CKMBINDEX, TROPONINI in the last 168 hours. BNP (last 3 results) No results for input(s): PROBNP in the last 8760 hours. HbA1C: No results for input(s): HGBA1C in the last 72 hours. CBG: No results for input(s): GLUCAP in the last 168 hours. Lipid Profile: No results for input(s): CHOL, HDL, LDLCALC, TRIG, CHOLHDL, LDLDIRECT in the last 72 hours. Thyroid Function Tests: No results for input(s): TSH, T4TOTAL, FREET4, T3FREE, THYROIDAB in the last 72 hours. Anemia Panel: No results for input(s): VITAMINB12, FOLATE, FERRITIN, TIBC,  IRON, RETICCTPCT in the last 72 hours. Urine analysis:    Component Value Date/Time   COLORURINE YELLOW (A) 10/26/2021 [place] (A) 10/26/2021 [place] 11/29/2013 2014   LABSPEC 1.014 10/26/2021 0351   LABSPEC 1.015 11/29/2013 2014   PHURINE 5.0 10/26/2021 0351   GLUCOSEU NEGATIVE 10/26/2021 0351   GLUCOSEU NEGATIVE 11/10/2020 1020   HGBUR NEGATIVE 10/26/2021 0351   BILIRUBINUR NEGATIVE 10/26/2021 0351   BILIRUBINUR neg 04/27/2015 1149   BILIRUBINUR NEGATIVE 11/29/2013 2014   KETONESUR NEGATIVE 10/26/2021 0351   PROTEINUR 30 (A) 10/26/2021 0351   UROBILINOGEN 0.2 11/10/2020 1020   NITRITE NEGATIVE 10/26/2021 0351   LEUKOCYTESUR TRACE (A) 10/26/2021 0351   LEUKOCYTESUR 3+ 11/29/2013 2014   Sepsis Labs: @LABRCNTIP (procalcitonin:4,lacticidven:4) ) Recent Results (from the past 240 hour(s))  Resp Panel by RT-PCR (Flu A&B, Covid) Nasopharyngeal Swab     Status: None   Collection Time: 10/26/21  3:51 AM   Specimen: Nasopharyngeal Swab; Nasopharyngeal(NP) swabs in vial transport medium  Result Value Ref Range Status   SARS Coronavirus 2 by RT PCR NEGATIVE NEGATIVE Final    Comment: (NOTE) SARS-CoV-2 target nucleic acids are NOT DETECTED.  The SARS-CoV-2 RNA is generally detectable in upper respiratory specimens during the acute  phase of infection. The lowest concentration of SARS-CoV-2 viral copies this assay can detect is 138 copies/mL. A negative result does not preclude SARS-Cov-2 infection and should not be used as the sole basis for treatment or other patient management decisions. A negative result may occur with  improper specimen collection/handling, submission of specimen other than nasopharyngeal swab, presence of viral mutation(s) within the areas targeted by this assay, and inadequate number of viral copies(<138 copies/mL). A negative result must be combined with clinical observations, patient history, and epidemiological information. The expected result is Negative.  Fact Sheet for Patients:  EntrepreneurPulse.com.au  Fact Sheet for Healthcare Providers:  IncredibleEmployment.be  This test is no t yet approved or cleared by the [place] FDA and  has been authorized for detection and/or diagnosis of SARS-CoV-2 by FDA under an Emergency Use Authorization (EUA). This EUA will remain  in effect (meaning this test can be used) for the duration of the COVID-19 declaration under Section 564(b)(1) of the Act, 21 U.S.C.section 360bbb-3(b)(1), unless the authorization is terminated  or revoked sooner.       Influenza A by PCR NEGATIVE NEGATIVE Final   Influenza B by PCR NEGATIVE NEGATIVE Final    Comment: (NOTE) The Xpert Xpress SARS-CoV-2/FLU/RSV plus assay is intended as an aid in the diagnosis of influenza from Nasopharyngeal swab specimens and should not be used as a sole basis for treatment. Nasal washings and aspirates are unacceptable for Xpert Xpress SARS-CoV-2/FLU/RSV testing.  Fact Sheet for Patients: EntrepreneurPulse.com.au  Fact Sheet for Healthcare Providers: IncredibleEmployment.be  This test is not yet approved or cleared by the [place] FDA and has been authorized for detection and/or diagnosis of  SARS-CoV-2 by FDA under an Emergency Use Authorization (EUA). This EUA will remain in effect (meaning this test can be used) for the duration of the COVID-19 declaration under Section 564(b)(1) of the Act, 21 U.S.C. section 360bbb-3(b)(1), unless the authorization is terminated or revoked.  Performed at [facility], [address], [place], [place] [address]      Radiological Exams on Admission: DG Chest 2 View  Result Date: 10/25/2021 CLINICAL DATA:  Dizziness EXAM: CHEST - 2 VIEW COMPARISON:  07/31/2019 FINDINGS: The heart size and mediastinal  contours are within normal limits. Both lungs are clear. The visualized skeletal structures are unremarkable. IMPRESSION: No active cardiopulmonary disease. Electronically Signed   By: Fidela Salisbury M.D.   On: 10/25/2021 20:29   CT Head Wo Contrast  Result Date: 10/26/2021 CLINICAL DATA:  Dizziness. EXAM: CT HEAD WITHOUT CONTRAST TECHNIQUE: Contiguous axial images were obtained from the base of the skull through the vertex without intravenous contrast. COMPARISON:  None. FINDINGS: Brain: There is mild cerebral atrophy with widening of the extra-axial spaces and ventricular dilatation. There are areas of decreased attenuation within the white matter tracts of the supratentorial brain, consistent with microvascular disease changes. Vascular: No hyperdense vessel or unexpected calcification. Skull: Normal. Negative for fracture or focal lesion. Sinuses/Orbits: No acute finding. Other: None. IMPRESSION: 1. Generalized cerebral atrophy. 2. No acute intracranial abnormality. Electronically Signed   By: Virgina Norfolk M.D.   On: 10/26/2021 00:09   MR BRAIN WO CONTRAST  Result Date: 10/26/2021 CLINICAL DATA:  87 year old female with vertigo, nausea and vomiting for 2 days. EXAM: MRI HEAD WITHOUT CONTRAST TECHNIQUE: Multiplanar, multiecho pulse sequences of the brain and surrounding structures were obtained without intravenous contrast.  COMPARISON:  Head CT without contrast 2358 hours last night. Brain MRI 05/01/2021. FINDINGS: Brain: No restricted diffusion to suggest acute infarction. No midline shift, mass effect, evidence of mass lesion, ventriculomegaly, extra-axial collection or acute intracranial hemorrhage. Cervicomedullary junction and pituitary are within normal limits. Scattered and patchy but overall mild for age bilateral cerebral white matter T2 and FLAIR hyperintensity is stable since May. No cortical encephalomalacia identified. No definite chronic cerebral blood products. Deep gray matter nuclei, brainstem and cerebellum remain normal for age. Vascular: Major intracranial vascular flow voids are stable. Skull and upper cervical spine: Negative. Sinuses/Orbits: Stable, negative. Other: Mastoids remain clear. Stable and grossly normal visible internal auditory structures. Stylomastoid foramina and parotid glands appear normal. IMPRESSION: 1. No acute intracranial abnormality. 2. Stable since May and largely normal for age noncontrast MRI appearance of the brain. Electronically Signed   By: Genevie Ann M.D.   On: 10/26/2021 05:17     EKG: I have personally reviewed.  Sinus rhythm, QTC 482, poor R wave progression, low voltage, T wave inversion in lead III/aVF.   Assessment/Plan Principal Problem:   Vertigo Active Problems:   Hypertension   Hypercholesterolemia   CKD (chronic kidney disease), stage IIIa   Elevated troponin   Leukocytosis   Elevated lactic acid level   Nausea vomiting and diarrhea   Vertigo: MRI of the brain is negative for stroke. -Placed on progressive bed for observation due to elevated troponin -PT/OT -As needed meclizine -IV fluid: 1.5 L LR, then 75 cc/h  Hypertension -IV hydralazine as needed -Metoprolol, Accupril -Hold HCTZ since patient need IV fluid  Hypercholesterolemia -Lipitor  CKD (chronic kidney disease), stage IIIa: Stable. -Follow-up with BMP  Elevated troponin: Troponin  level is mildly elevated 7, 20, 36, no chest pain.  Possibly due to demand ischemia -Aspirin, Lipitor, metoprolol -Trend troponin -Check A1c, FLP  Leukocytosis and Elevated lactic acid level: Patient has WBC 15.2.  Lactic acid is elevated 3.5.  No source of infection identified.  Urinalysis negative except for trace amount of leukocyte, but patient does not have symptoms of UTI.  Chest x-ray negative.  No fever.  No source of infection identified so far.  Elevated lactic acid may be due to dehydration secondary to nausea, vomiting and diarrhea. -IV fluids as above -Trend lactic acid level -Get blood culture and  urine culture  Nausea vomiting and diarrhea: Patient's nausea and vomiting is likely related to vertigo.  Etiology for diarrhea is not clear. -IV fluid as above -Follow-up C. difficile and GI pathogen panel     DVT ppx: SQ Lovenox Code Status: DNR per her son who is POA Family Communication:  Yes, patient's  son  at bed side Disposition Plan:  Anticipate discharge back to previous environment Consults called:  none Admission status and Level of care: Med-Surg:  for obs    Status is: Observation  The patient remains OBS appropriate and will d/c before 2 midnights.         Date of Service 10/26/2021    Lorretta Harp [facility]   If 7PM-7AM, please contact night-coverage [email or web address] 10/26/2021, 2:39 PM

## 2021-10-26 NOTE — Plan of Care (Signed)
  Problem: Education: Goal: Knowledge of General Education information will improve Description: Including pain rating scale, medication(s)/side effects and non-pharmacologic comfort measures Outcome: Progressing   Problem: Activity: Goal: Risk for activity intolerance will decrease Outcome: Progressing   Problem: Elimination: Goal: Will not experience complications related to urinary retention Outcome: Progressing   Problem: Pain Managment: Goal: General experience of comfort will improve Outcome: Progressing   Problem: Safety: Goal: Ability to remain free from injury will improve Outcome: Progressing   

## 2021-10-26 NOTE — ED Notes (Addendum)
Pt returns from CT via w/c; incontinent urine; pt clensed, clothing removed, gown & attends placed; pt unstable with standing--c/o dizziness and nausea only, no other c/o at this time; vs retaken, IV initiated and zofran admin; assisted into recliner for comfort and warm blankets given

## 2021-10-26 NOTE — ED Notes (Signed)
Pt resting in room, call light within reach. VSS. NAD. No concerns or needs voiced. Will continue to monitor for changes.

## 2021-10-26 NOTE — ED Notes (Signed)
Pt sitting in w/c in BR in triage; to CT via w/c accomp by CT tech

## 2021-10-26 NOTE — ED Notes (Signed)
Pt to MRI

## 2021-10-26 NOTE — Progress Notes (Signed)
PHARMACIST - PHYSICIAN COMMUNICATION  CONCERNING:  Enoxaparin (Lovenox) for DVT Prophylaxis    RECOMMENDATION: Patient was prescribed enoxaparin 40mg  q24 hours for VTE prophylaxis.   Filed Weights   10/25/21 2005  Weight: 63.5 kg (140 lb)    Body mass index is 26.45 kg/m.  Estimated Creatinine Clearance: 26.9 mL/min (A) (by C-G formula based on SCr of 1.14 mg/dL (H)).   Patient is candidate for enoxaparin 30mg  every 24 hours based on CrCl <36ml/min or Weight <45kg  DESCRIPTION: Pharmacy has adjusted enoxaparin dose per [facility] policy.  Patient is now receiving enoxaparin 30 mg every 24 hours   31m 10/26/2021 5:03 PM

## 2021-10-27 DIAGNOSIS — R42 Dizziness and giddiness: Secondary | ICD-10-CM | POA: Diagnosis not present

## 2021-10-27 LAB — BLOOD CULTURE ID PANEL (REFLEXED) - BCID2

## 2021-10-27 LAB — URINE CULTURE

## 2021-10-27 LAB — LIPID PANEL
Cholesterol: 140 mg/dL (ref 0–200)
HDL: 43 mg/dL (ref >40)
LDL Cholesterol: 78 mg/dL (ref 0–99)
Total CHOL/HDL Ratio: 3.3 RATIO
Triglycerides: 94 mg/dL (ref <150)
VLDL: 19 mg/dL (ref 0–40)

## 2021-10-27 LAB — BASIC METABOLIC PANEL
Anion gap: 6 (ref 5–15)
BUN: 22 mg/dL (ref 8–23)
CO2: 23 mmol/L (ref 22–32)
Calcium: 8.3 mg/dL — ABNORMAL LOW (ref 8.9–10.3)
Chloride: 107 mmol/L (ref 98–111)
Creatinine, Ser: 1.08 mg/dL — ABNORMAL HIGH (ref 0.44–1.00)
GFR, Estimated: 48 mL/min — ABNORMAL LOW (ref >60)
Glucose, Bld: 90 mg/dL (ref 70–99)
Potassium: 4.1 mmol/L (ref 3.5–5.1)
Sodium: 136 mmol/L (ref 135–145)

## 2021-10-27 LAB — CBC
HCT: 37.9 % (ref 36.0–46.0)
Hemoglobin: 12.4 g/dL (ref 12.0–15.0)
MCH: 30.8 pg (ref 26.0–34.0)
MCHC: 32.7 g/dL (ref 30.0–36.0)
MCV: 94.3 fL (ref 80.0–100.0)
Platelets: 203 10*3/uL (ref 150–400)
RBC: 4.02 MIL/uL (ref 3.87–5.11)
RDW: 14.2 % (ref 11.5–15.5)
WBC: 7.5 10*3/uL (ref 4.0–10.5)
nRBC: 0 % (ref 0.0–0.2)

## 2021-10-27 LAB — GLUCOSE, CAPILLARY: Glucose-Capillary: 93 mg/dL (ref 70–99)

## 2021-10-27 LAB — LACTIC ACID, PLASMA: Lactic Acid, Venous: 1.6 mmol/L (ref 0.5–1.9)

## 2021-10-27 MED ORDER — DIPHENHYDRAMINE HCL 50 MG/ML IJ SOLN
25.0000 mg | Freq: Four times a day (QID) | INTRAMUSCULAR | Status: DC | PRN
  Administered 2021-10-27: 18:00:00 25 mg via INTRAVENOUS
  Filled 2021-10-27: qty 1

## 2021-10-27 MED ORDER — DIPHENHYDRAMINE HCL 50 MG/ML IJ SOLN
25.0000 mg | Freq: Once | INTRAMUSCULAR | Status: AC
  Administered 2021-10-27: 13:00:00 25 mg via INTRAVENOUS
  Filled 2021-10-27: qty 1

## 2021-10-27 MED ORDER — DIAZEPAM 5 MG PO TABS: 5.0000 mg | ORAL_TABLET | Freq: Four times a day (QID) | ORAL | Status: DC | PRN

## 2021-10-27 MED ORDER — MECLIZINE HCL 12.5 MG PO TABS: 25.0000 mg | ORAL_TABLET | Freq: Three times a day (TID) | ORAL | 0 refills | Status: AC | PRN

## 2021-10-27 MED ORDER — PROCHLORPERAZINE EDISYLATE 10 MG/2ML IJ SOLN
10.0000 mg | Freq: Four times a day (QID) | INTRAMUSCULAR | Status: DC | PRN
  Filled 2021-10-27: qty 2

## 2021-10-27 NOTE — Discharge Summary (Addendum)
Physician Discharge Summary  [name] VQM:[id number] DOB: 14-Jul-1927 DOA: 10/26/2021  PCP: Dale [place], MD  Admit date: 10/26/2021 Discharge date: 10/29/21  Admitted From: Home Disposition:  Home  Discharge Condition:Stable CODE STATUS: DNR Diet recommendation: Heart Healthy   Brief/Interim Summary:  Elizabeth [name] is a 87 y.o. female with medical history significant of hypertension, hyperlipidemia, diet-controlled diabetes, GERD, vertigo, SVT, IBS, cystocele, CKD-3, who presents with vertigo.  Patient was also reporting history of diarrhea.  On presentation lab work showed WBC count of 15.2, BNP of 521, lactic acid of 3.5.  CT head negative for intracranial normalities.  MRI was negative for stroke.  Chest x-ray was negative for pneumonia.  Patient was admitted for further management.  This morning she remains hemodynamically stable.  She does not complain of any vertigo.  PT/OT assessed her and recommended outpatient physical therapy follow-up for vestibular therapy.  Her lab values significantly improved .  She is medically stable for discharge home today.  Following problems were addressed during hospitalization:   Vertigo: MRI of the brain is negative for stroke. Had episode like this in the past.  Follows with ENT.  We recommend to follow-up with ENT as an outpatient.  We recommend to follow-up with outpatient respiratory therapy  Hypertension - blood pressure stable.  Continue home medications   Hypercholesterolemia -On Lipitor.  LDL of 78   CKD (chronic kidney disease), stage IIIa: Stable. -Monitor kidney function as an outpatient   Elevated troponin: Troponin level is mildly elevated 7, 20, 36, no chest pain.  Possibly due to demand ischemia -Aspirin, Lipitor, metoprolol to be continued on discharge  Leukocytosis and Elevated lactic acid level: Patient has WBC 15.2.  Lactic acid is elevated 3.5.  No source of infection identified.  Urinalysis negative except for trace  amount of leukocyte, but patient does not have symptoms of UTI.  Chest x-ray negative.  No fever.  No source of infection identified so far.  Elevated lactic acid may be due to dehydration secondary to nausea, vomiting and diarrhea. Lactic acid level and white cell count significantly improved and normalized  Nausea vomiting and diarrhea: Patient's nausea and vomiting is likely related to vertigo.  Etiology for diarrhea is not clear.  All these  symptoms have resolved  Discharge Diagnoses:  Principal Problem:   Vertigo Active Problems:   Hypertension   Hypercholesterolemia   CKD (chronic kidney disease), stage IIIa   Elevated troponin   Leukocytosis   Elevated lactic acid level   Nausea vomiting and diarrhea    Discharge Instructions  Discharge Instructions     Diet - low sodium heart healthy   Complete by: As directed    Discharge instructions   Complete by: As directed    1)Please follow up with your PCP in a week 2)Follow up with outpatient physical therapy for vestibular therapy   Increase activity slowly   Complete by: As directed       Allergies as of 10/29/2021   No Known Allergies      Medication List     TAKE these medications    aspirin 81 MG tablet Take 81 mg by mouth daily.   cetirizine 5 MG tablet Commonly known as: ZYRTEC Take 5 mg by mouth daily as needed.   estradiol 0.1 MG/GM vaginal cream Commonly known as: ESTRACE PLACE 1 APPLICATORFUL VAGINALLY 2 TIMES A WEEK   hydrochlorothiazide 25 MG tablet Commonly known as: HYDRODIURIL Take 0.5 tablets (12.5 mg total) by mouth daily.  Lipitor 40 MG tablet Generic drug: atorvastatin Take 1 tablet (40 mg total) by mouth daily.   magnesium oxide 400 (241.3 Mg) MG tablet Commonly known as: MAG-OX TAKE 1 TABLET BY MOUTH ONCE DAILY   meclizine 12.5 MG tablet Commonly known as: ANTIVERT Take 2 tablets (25 mg total) by mouth 3 (three) times daily as needed for dizziness.   metoprolol tartrate 25  MG tablet Commonly known as: LOPRESSOR Take 1 tablet (25 mg total) by mouth 2 (two) times daily.   ondansetron 4 MG tablet Commonly known as: Zofran Take 1 tablet (4 mg total) by mouth daily as needed for nausea or vomiting.   quinapril 40 MG tablet Commonly known as: ACCUPRIL Take 1 tablet (40 mg total) by mouth daily.   triamcinolone 55 MCG/ACT nasal inhaler Commonly known as: NASACORT Place 2 sprays into the nose daily as needed.        Follow-up Information     Elizabeth Pheasant, MD. Schedule an appointment as soon as possible for a visit in 1 week(s).   Specialty: Internal Medicine Why: Patient to call Monday at her earliest convenience. Contact information: [id number] [place] [address] [phone]         Elizabeth Merritts, MD .   Specialty: Cardiology Why: Patient to call Monday at earliest convenience. Contact information: [place] [address] [phone]                No Known Allergies  Consultations: None   Procedures/Studies: DG Chest 2 View  Result Date: 10/25/2021 CLINICAL DATA:  Dizziness EXAM: CHEST - 2 VIEW COMPARISON:  07/31/2019 FINDINGS: The heart size and mediastinal contours are within normal limits. Both lungs are clear. The visualized skeletal structures are unremarkable. IMPRESSION: No active cardiopulmonary disease. Electronically Signed   By: Fidela Salisbury M.D.   On: 10/25/2021 20:29   CT Head Wo Contrast  Result Date: 10/26/2021 CLINICAL DATA:  Dizziness. EXAM: CT HEAD WITHOUT CONTRAST TECHNIQUE: Contiguous axial images were obtained from the base of the skull through the vertex without intravenous contrast. COMPARISON:  None. FINDINGS: Brain: There is mild cerebral atrophy with widening of the extra-axial spaces and ventricular dilatation. There are areas of decreased attenuation within the white matter tracts of the supratentorial brain, consistent with  microvascular disease changes. Vascular: No hyperdense vessel or unexpected calcification. Skull: Normal. Negative for fracture or focal lesion. Sinuses/Orbits: No acute finding. Other: None. IMPRESSION: 1. Generalized cerebral atrophy. 2. No acute intracranial abnormality. Electronically Signed   By: Virgina Norfolk M.D.   On: 10/26/2021 00:09   MR BRAIN WO CONTRAST  Result Date: 10/26/2021 CLINICAL DATA:  87 year old female with vertigo, nausea and vomiting for 2 days. EXAM: MRI HEAD WITHOUT CONTRAST TECHNIQUE: Multiplanar, multiecho pulse sequences of the brain and surrounding structures were obtained without intravenous contrast. COMPARISON:  Head CT without contrast 2358 hours last night. Brain MRI 05/01/2021. FINDINGS: Brain: No restricted diffusion to suggest acute infarction. No midline shift, mass effect, evidence of mass lesion, ventriculomegaly, extra-axial collection or acute intracranial hemorrhage. Cervicomedullary junction and pituitary are within normal limits. Scattered and patchy but overall mild for age bilateral cerebral white matter T2 and FLAIR hyperintensity is stable since May. No cortical encephalomalacia identified. No definite chronic cerebral blood products. Deep gray matter nuclei, brainstem and cerebellum remain normal for age. Vascular: Major intracranial vascular flow voids are stable. Skull and upper cervical spine: Negative. Sinuses/Orbits: Stable, negative. Other: Mastoids remain clear. Stable  and grossly normal visible internal auditory structures. Stylomastoid foramina and parotid glands appear normal. IMPRESSION: 1. No acute intracranial abnormality. 2. Stable since May and largely normal for age noncontrast MRI appearance of the brain. Electronically Signed   By: Genevie Ann M.D.   On: 10/26/2021 05:17      Subjective: Patient seen and examined at the bedside this morning.  Hemodynamically stable for discharge today.  I called the son for update, call not  received  Discharge Exam: Vitals:   10/29/21 0324 10/29/21 0755  BP: (!) 150/64 (!) 172/78  Pulse: 65 66  Resp: 16 17  Temp: 98 F (36.7 C) 98 F (36.7 C)  SpO2: 96% 96%   Vitals:   10/28/21 2124 10/29/21 0105 10/29/21 0324 10/29/21 0755  BP: (!) 150/69 (!) 167/71 (!) 150/64 (!) 172/78  Pulse: 65 78 65 66  Resp: 16  16 17   Temp: 98 F (36.7 C) 97.9 F (36.6 C) 98 F (36.7 C) 98 F (36.7 C)  TempSrc: Oral Oral Oral   SpO2: 96%  96% 96%  Weight:      Height:        General: Pt is alert, awake, not in acute distress Cardiovascular: RRR, S1/S2 +, no rubs, no gallops Respiratory: CTA bilaterally, no wheezing, no rhonchi Abdominal: Soft, NT, ND, bowel sounds + Extremities: no edema, no cyanosis    The results of significant diagnostics from this hospitalization (including imaging, microbiology, ancillary and laboratory) are listed below for reference.     Microbiology: No results found for this or any previous visit (from the past 240 hour(s)).    Labs: BNP (last 3 results) Recent Labs    10/26/21 1058  BNP [id number]   Basic Metabolic Panel: No results for input(s): NA, K, CL, CO2, GLUCOSE, BUN, CREATININE, CALCIUM, MG, PHOS in the last 168 hours.  Liver Function Tests: No results for input(s): AST, ALT, ALKPHOS, BILITOT, PROT, ALBUMIN in the last 168 hours.  No results for input(s): LIPASE, AMYLASE in the last 168 hours.  No results for input(s): AMMONIA in the last 168 hours. CBC: No results for input(s): WBC, NEUTROABS, HGB, HCT, MCV, PLT in the last 168 hours.  Cardiac Enzymes: No results for input(s): CKTOTAL, CKMB, CKMBINDEX, TROPONINI in the last 168 hours. BNP: Invalid input(s): POCBNP CBG: No results for input(s): GLUCAP in the last 168 hours.  D-Dimer No results for input(s): DDIMER in the last 72 hours. Hgb A1c No results for input(s): HGBA1C in the last 72 hours.  Lipid Profile No results for input(s): CHOL, HDL, LDLCALC, TRIG, CHOLHDL,  LDLDIRECT in the last 72 hours.  Thyroid function studies No results for input(s): TSH, T4TOTAL, T3FREE, THYROIDAB in the last 72 hours.  Invalid input(s): FREET3 Anemia work up No results for input(s): VITAMINB12, FOLATE, FERRITIN, TIBC, IRON, RETICCTPCT in the last 72 hours. Urinalysis    Component Value Date/Time   COLORURINE YELLOW (A) 10/26/2021 0351   APPEARANCEUR CLEAR (A) 10/26/2021 0351   APPEARANCEUR CLEAR 11/29/2013 2014   LABSPEC 1.014 10/26/2021 0351   LABSPEC 1.015 11/29/2013 2014   PHURINE 5.0 10/26/2021 0351   GLUCOSEU NEGATIVE 10/26/2021 0351   GLUCOSEU NEGATIVE 11/10/2020 1020   HGBUR NEGATIVE 10/26/2021 0351   BILIRUBINUR NEGATIVE 10/26/2021 0351   BILIRUBINUR neg 04/27/2015 1149   BILIRUBINUR NEGATIVE 11/29/2013 2014   KETONESUR NEGATIVE 10/26/2021 0351   PROTEINUR 30 (A) 10/26/2021 0351   UROBILINOGEN 0.2 11/10/2020 1020   NITRITE NEGATIVE 10/26/2021 0351   LEUKOCYTESUR TRACE (A)  10/26/2021 0351   LEUKOCYTESUR 3+ 11/29/2013 2014   Sepsis Labs Invalid input(s): PROCALCITONIN,  WBC,  LACTICIDVEN Microbiology No results found for this or any previous visit (from the past 240 hour(s)).   Please note: You were cared for by a hospitalist during your hospital stay. Once you are discharged, your primary care physician will handle any further medical issues. Please note that NO REFILLS for any discharge medications will be authorized once you are discharged, as it is imperative that you return to your primary care physician (or establish a relationship with a primary care physician if you do not have one) for your post hospital discharge needs so that they can reassess your need for medications and monitor your lab values.    Time coordinating discharge: 40 minutes  SIGNED:   Shelly Coss, MD  [facility] 11/08/2021, 2:26 PM Pager [phone]  If 7PM-7AM, please contact night-coverage [email or web address] Password [email or web address]

## 2021-10-27 NOTE — Progress Notes (Signed)
PHARMACY - PHYSICIAN COMMUNICATION CRITICAL VALUE ALERT - BLOOD CULTURE IDENTIFICATION (BCID)  BCID:  1 (anaerobic) of 4 w/ Staph Species.  Pt not on any abx at this time.  Pt is afebrile, but WBC at 15.2 on 11/2.  Start Vancomycin or continue to observe?  Name of provider contacted: Cliffton Asters, NP  Changes to prescribed antibiotics required: Per NP, continue to observe pt at this time.  Otelia Sergeant, PharmD, [facility] 10/27/2021 5:49 AM

## 2021-10-27 NOTE — Progress Notes (Signed)
I was reported that, patient started having vertigo again with nausea and vomiting.  We will cancel the discharge plan

## 2021-10-27 NOTE — TOC Initial Note (Signed)
Transition of Care [facility]) - Initial/Assessment Note    Patient Details  Name: Elizabeth [name] MRN: [id number] Date of Birth: [birth year]  [facility] CM/SW Contact:    Shelbie Hutching, RN Phone Number: 10/27/2021, 3:07 PM  Clinical Narrative:                 Patient under observation for Vertigo still having dizziness and nausea and vomiting.  RNCM met with patient at the bedside.  Patient's daughter is also at the bedside.  Patient lives alone and is independent and drives.  Son lives in [place] and is her Emmett, daughter lives in [place].  Patient agrees to OP PT- referral sent down to OP PT department here at [facility].  Family will provide transportation home for patient when medically cleared.   Expected Discharge Plan: OP Rehab Barriers to Discharge: Continued Medical Work up   Patient Goals and CMS Choice Patient states their goals for this hospitalization and ongoing recovery are:: Patient wants to feel better before going home      Expected Discharge Plan and Services Expected Discharge Plan: OP Rehab   Discharge Planning Services: CM Consult   Living arrangements for the past 2 months: Apartment Expected Discharge Date: 10/27/21               DME Arranged: N/A DME Agency: NA       HH Arranged: NA [place] Agency: NA        Prior Living Arrangements/Services Living arrangements for the past 2 months: Apartment Lives with:: Self Patient language and need for interpreter reviewed:: Yes Do you feel safe going back to the place where you live?: Yes      Need for Family Participation in Patient Care: Yes (Comment) (vertigo) Care giver support system in place?: Yes (comment) (son and daughter)   Criminal Activity/Legal Involvement Pertinent to Current Situation/Hospitalization: No - Comment as needed  Activities of Daily Living Home Assistive Devices/Equipment: None ADL Screening (condition at time of admission) Patient's cognitive ability adequate to safely  complete daily activities?: Yes Is the patient deaf or have difficulty hearing?: No Does the patient have difficulty seeing, even when wearing glasses/contacts?: No Does the patient have difficulty concentrating, remembering, or making decisions?: No Patient able to express need for assistance with ADLs?: Yes Does the patient have difficulty dressing or bathing?: No Independently performs ADLs?: Yes (appropriate for developmental age) Does the patient have difficulty walking or climbing stairs?: No Weakness of Legs: None Weakness of Arms/Hands: None  Permission Sought/Granted Permission sought to share information with : Case Manager, Family Supports, Other (comment) Permission granted to share information with : Yes, Verbal Permission Granted  Share Information with NAME: Jolie Strohecker  Permission granted to share info w AGENCY: [facility]  Permission granted to share info w Relationship: son  Permission granted to share info w Contact Information: [phone]  Emotional Assessment Appearance:: Appears stated age Attitude/Demeanor/Rapport: Engaged Affect (typically observed): Accepting Orientation: : Oriented to Self, Oriented to Place, Oriented to  Time, Oriented to Situation   Psych Involvement: No (comment)  Admission diagnosis:  Vertigo [R42] Elevated troponin [R77.8] Patient Active Problem List   Diagnosis Date Noted   Vertigo 10/26/2021   Elevated troponin 10/26/2021   Leukocytosis 10/26/2021   Elevated lactic acid level 10/26/2021   Nausea vomiting and diarrhea 10/26/2021   Aortic atherosclerosis ([place]) 06/26/2021   Auditory hallucinations 04/10/2021   Right shoulder pain 10/02/2020   CKD (chronic kidney disease), stage IIIa 10/02/2020  Elevated TSH 10/02/2020   Weakness 05/29/2020   Type 2 diabetes mellitus with hyperglycemia ([place]) 01/07/2020   GERD (gastroesophageal reflux disease) 11/09/2018   Dizziness 06/02/2017   Vaginal atrophy 09/27/2016   SVT  (supraventricular tachycardia) ([place]) 01/27/2016   Headache 01/26/2016   Left arm numbness 01/26/2016   Urethral caruncle 12/23/2015   Vaginal pessary in situ 09/22/2015   Health care maintenance 07/22/2015   Baden-Walker grade 4 cystocele 06/26/2015   First degree uterine prolapse 06/26/2015   Hyperkalemia 04/27/2015   Arrhythmia 04/27/2015   SOB (shortness of breath) 04/27/2015   Double vision 07/18/2014   Decreased hearing 07/18/2014   Stress 07/18/2014   Fatigue 07/18/2014   Environmental allergies 07/09/2013   Hypertension 12/09/2012   Hypercholesterolemia 12/09/2012   PCP:  Einar Pheasant, MD Pharmacy:   [facility] [place] [place] [address] Phone: [phone] Fax: [phone]  [facility] [place], [place] [address] Phone: [phone] Fax: [phone]     Social Determinants of Health (SDOH) Interventions    Readmission Risk Interventions No flowsheet data found.

## 2021-10-27 NOTE — Care Management Obs Status (Signed)
MEDICARE OBSERVATION STATUS NOTIFICATION   Patient Details  Name: Elizabeth [name] MRN: [id number] Date of Birth: [birth year]   Medicare Observation Status Notification Given:  Yes    Melek Pownall E Kashana Breach, LCSW 10/27/2021, 9:51 AM

## 2021-10-27 NOTE — Evaluation (Addendum)
Physical Therapy Evaluation Patient Details Name: Elizabeth [name] MRN: [id number] DOB: 1927-09-04 Today's Date: 10/27/2021  History of Present Illness  Pt is a 87 y.o. F w/ PMH significant of hypertension, hyperlipidemia, diet-controlled diabetes, GERD, vertigo, SVT, IBS, cystocele, CKD-3, who presents with vertigo.  Clinical Impression  Pt alert in bed, states recently receiving Meclizine and vertigo diminished starting beginning of session. Pt states she is independent with mobility without AD and her son assists with driving to and from apartments. Pt lives alone and only has intermittent help. Pt states vertigo is ongoing over the last 20 years and she has not been evaluated by a physical therapist. Pt is fearful of testing vertigo on this date as her symptoms recently subsided, declined dix hallpike and supine roll test. Pt education provided on possibilities of outpatient PT for further assessment of symptoms. Pt appears to be at baseline functionally and does not require physical assistance or AD. CGA - SUPV for ambulation and upright mobility secondary to slight symptoms of vertigo. Outpatient PT recommended at discharge. Skilled PT intervention is indicated to address deficits in function, mobility, and to return to PLOF as able.    PT checked on patient a bit later to offer services for BPPV assessment, pt nauseous, vomiting in emesis bag, and complaining of worsened, continuous vertigo symptoms. Did state that she felt she was going to pass out. RN in room and aware.   Recommendations for follow up therapy are one component of a multi-disciplinary discharge planning process, led by the attending physician.  Recommendations may be updated based on patient status, additional functional criteria and insurance authorization.  Follow Up Recommendations Outpatient PT    Assistance Recommended at Discharge Intermittent Supervision/Assistance  Functional Status Assessment Patient has had a recent  decline in their functional status and demonstrates the ability to make significant improvements in function in a reasonable and predictable amount of time.  Equipment Recommendations  None recommended by PT    Recommendations for Other Services       Precautions / Restrictions Precautions Precautions: Fall Restrictions Weight Bearing Restrictions: No      Mobility  Bed Mobility Overal bed mobility: Modified Independent             General bed mobility comments: HOB elevated    Transfers Overall transfer level: Needs assistance Equipment used: None Transfers: Sit to/from Stand Sit to Stand: Supervision           General transfer comment: SUPV d/t slight vertigo    Ambulation/Gait Ambulation/Gait assistance: Min guard Gait Distance (Feet): 40 Feet Assistive device: None Gait Pattern/deviations: Step-through pattern;Decreased step length - left;Decreased stance time - right     General Gait Details: CGA for safety, no LOB or instability  Stairs            Wheelchair Mobility    Modified Rankin (Stroke Patients Only)       Balance Overall balance assessment: Needs assistance Sitting-balance support: Feet supported;No upper extremity supported Sitting balance-Leahy Scale: Good Sitting balance - Comments: Able to reach across midline without LOB   Standing balance support: During functional activity;No upper extremity supported Standing balance-Leahy Scale: Good Standing balance comment: No UE support and does not reach for walls for balance                             Pertinent Vitals/Pain Pain Assessment: Faces Faces Pain Scale: No hurt Pain Intervention(s): Monitored during session  Home Living Family/patient expects to be discharged to:: Private residence Living Arrangements: Alone Available Help at Discharge: Family;Available PRN/intermittently (son) Type of Home: Apartment Home Access: Level entry       Home Layout:  One level        Prior Function Prior Level of Function : Independent/Modified Independent             Mobility Comments: Pt is independent for all mobilty and does not utilize an AD ADLs Comments: Son drives to appointments     Hand Dominance        Extremity/Trunk Assessment   Upper Extremity Assessment Upper Extremity Assessment: Overall WFL for tasks assessed    Lower Extremity Assessment Lower Extremity Assessment: Overall WFL for tasks assessed       Communication   Communication: HOH  Cognition Arousal/Alertness: Awake/alert Behavior During Therapy: WFL for tasks assessed/performed;Anxious Overall Cognitive Status: Within Functional Limits for tasks assessed                                 General Comments: AOx3, cooperative, conversant        General Comments      Exercises Other Exercises Other Exercises: Pt edu: update pt on outpatient PT services to assess and address possible BPPV and vertgo   Assessment/Plan    PT Assessment Patient needs continued PT services  PT Problem List Decreased strength;Decreased range of motion;Decreased activity tolerance;Decreased balance;Decreased mobility       PT Treatment Interventions Gait training;Functional mobility training;Therapeutic activities;Therapeutic exercise;Neuromuscular re-education;Balance training    PT Goals (Current goals can be found in the Care Plan section)  Acute Rehab PT Goals Patient Stated Goal: to decrease vertigo symptoms PT Goal Formulation: With patient Time For Goal Achievement: 11/10/21 Potential to Achieve Goals: Good    Frequency Min 2X/week   Barriers to discharge        Co-evaluation               AM-PAC PT "6 Clicks" Mobility  Outcome Measure Help needed turning from your back to your side while in a flat bed without using bedrails?: None Help needed moving from lying on your back to sitting on the side of a flat bed without using bedrails?:  None Help needed moving to and from a bed to a chair (including a wheelchair)?: None Help needed standing up from a chair using your arms (e.g., wheelchair or bedside chair)?: None Help needed to walk in hospital room?: A Little Help needed climbing 3-5 steps with a railing? : A Little 6 Click Score: 22    End of Session Equipment Utilized During Treatment: Gait belt Activity Tolerance: Patient tolerated treatment well Patient left: in chair;with call bell/phone within reach;with chair alarm set Nurse Communication: Mobility status PT Visit Diagnosis: Dizziness and giddiness (R42)    Time: 6967-8938 PT Time Calculation (min) (ACUTE ONLY): 41 min   Charges:              [employer], SPT

## 2021-10-27 NOTE — Progress Notes (Signed)
At 1000 patient called out and complained of extreme vertigo and nausea/vomiting. This nurse gave her IV Zofran and told her she would give the PO Meclizine for dizziness once the Zofran took effect and she stopped vomiting. An hour later (1100) she is still vomiting and experiencing vertigo. Patient says everything is spinning and she cannot get from chair to bed. Notified provider of patients change in status as she is scheduled for discharge at this time. The provider discontinued the discharge order and ordered this nurse to give PO meclizine to treat dizziness. Meclizine administered at 1138, This nurse reassessed patient an hour later and she was still dry heaving and experiencing vertigo. Contacted provider again and got a one time order for IV Benadryl. Benadryl given at 1236. Patient began to rest comfortably after administration but still complains of dizziness at 1430. This nurse contacted provider again and received more medication orders for nausea, vomiting and vertigo. (SEE ORDER HISTORY) Explained to patient what was ordered and the medications available to help treat symptoms and she declined treatment at this time and says " Im scared to try new medicine".  Education provided to patient and informed her that it was her choice to refuse while also making her aware that if she changed her mind, those medication options would still be available to her. At this time (1745), patient is having trouble remembering events from today and stopping in the middle of sentences, also losing train of thought. This is a change in baseline from beginning of shift (0700) when patient was A/O x 4. Son is POA and is present at bedside at this time and is making decisions for her care due to confusion. He says to give her the medication for vertigo regardless of what patient says because she is not in right state of mind at this time. Reached out to provider for clarification on medications and received new orders.  Will administer and continue to monitor.

## 2021-10-27 NOTE — Evaluation (Signed)
Occupational Therapy Evaluation Patient Details Name: Elizabeth [name] MRN: [id number] DOB: [birth year] Today's Date: 10/27/2021   History of Present Illness Pt is a 87 y.o. F w/ PMH significant of hypertension, hyperlipidemia, diet-controlled diabetes, GERD, vertigo, SVT, IBS, cystocele, CKD-3, who presents with vertigo.   Clinical Impression   Patient presenting with decreased Ind in self care, balance, functional mobility/transfers, endurance, and safety awareness.Patient reports being independent at baseline and lives alone PTA. Her family assist her with taking her to appointments.  Patient currently functioning at supervision - min guard without use of AD. Pt very limited during this evaluation secondary to nausea and vomiting. OT assisted pt back into bed for pt safety. She was diaphoretic at end of session. RN notified and assessed pt while therapist in room. Patient will benefit from acute OT to increase overall independence in the areas of ADLs, functional mobility, and safety awareness in order to safely discharge to next venue of care.       Recommendations for follow up therapy are one component of a multi-disciplinary discharge planning process, led by the attending physician.  Recommendations may be updated based on patient status, additional functional criteria and insurance authorization.   Follow Up Recommendations  No OT follow up    Assistance Recommended at Discharge Intermittent Supervision/Assistance  Functional Status Assessment  Patient has had a recent decline in their functional status and demonstrates the ability to make significant improvements in function in a reasonable and predictable amount of time.  Equipment Recommendations  None recommended by OT       Precautions / Restrictions Precautions Precautions: Fall Restrictions Weight Bearing Restrictions: No      Mobility Bed Mobility Overal bed mobility: Modified Independent             General bed  mobility comments: HOB elevated    Transfers Overall transfer level: Needs assistance Equipment used: 1 person hand held assist Transfers: Sit to/from [employer] Transfers Sit to Stand: Supervision Stand pivot transfers: Supervision;Min guard         General transfer comment: very dizzy and reporting " I might pass out because I am so sick".      Balance Overall balance assessment: Needs assistance Sitting-balance support: Feet supported;No upper extremity supported Sitting balance-Leahy Scale: Good Sitting balance - Comments: Able to reach across midline without LOB   Standing balance support: During functional activity;No upper extremity supported Standing balance-Leahy Scale: Fair Standing balance comment: No UE support and does not reach for walls for balance                           ADL either performed or assessed with clinical judgement   ADL Overall ADL's : Needs assistance/impaired                                       General ADL Comments: S -  min guard secondary to dizziness and nausea being very limiting factor during OT session.     Vision Patient Visual Report: No change from baseline              Pertinent Vitals/Pain Pain Assessment: Faces Faces Pain Scale: No hurt Pain Intervention(s): Monitored during session        Extremity/Trunk Assessment Upper Extremity Assessment Upper Extremity Assessment: Overall WFL for tasks assessed   Lower Extremity Assessment Lower Extremity Assessment: Overall  WFL for tasks assessed       Communication Communication Communication: HOH   Cognition Arousal/Alertness: Awake/alert Behavior During Therapy: WFL for tasks assessed/performed;Anxious Overall Cognitive Status: Within Functional Limits for tasks assessed                                 General Comments: Pt is very pleasant but very nauseated and dry heaving throughout session.        Exercises  Other Exercises Other Exercises: Pt edu: update pt on outpatient PT services to assess and address possible BPPV and vertgo        Home Living Family/patient expects to be discharged to:: Private residence Living Arrangements: Alone Available Help at Discharge: Family;Available PRN/intermittently Type of Home: Apartment Home Access: Level entry     Home Layout: One level     Bathroom Shower/Tub: Tub/shower unit                    Prior Functioning/Environment Prior Level of Function : Independent/Modified Independent             Mobility Comments: Pt is independent for all mobilty and does not utilize an AD ADLs Comments: Son drives to appointments and pt is independent with self care        OT Problem List: Decreased activity tolerance;Impaired balance (sitting and/or standing);Decreased safety awareness;Decreased knowledge of use of DME or AE      OT Treatment/Interventions: Self-care/ADL training;Manual therapy;Therapeutic exercise;Modalities;Patient/family education;Balance training;Energy conservation;DME and/or AE instruction;Therapeutic activities    OT Goals(Current goals can be found in the care plan section) Acute Rehab OT Goals Patient Stated Goal: "to stop feeling sick" OT Goal Formulation: With patient/family Time For Goal Achievement: 11/10/21 Potential to Achieve Goals: Good ADL Goals Pt Will Perform Grooming: Independently Pt Will Perform Lower Body Dressing: Independently Pt Will Transfer to Toilet: Independently Pt Will Perform Toileting - Clothing Manipulation and hygiene: Independently  OT Frequency: Min 2X/week   Barriers to D/C:    none known at this time          AM-PAC OT "6 Clicks" Daily Activity     Outcome Measure Help from another person eating meals?: None Help from another person taking care of personal grooming?: None Help from another person toileting, which includes using toliet, bedpan, or urinal?: A Little Help  from another person bathing (including washing, rinsing, drying)?: A Little Help from another person to put on and taking off regular upper body clothing?: None Help from another person to put on and taking off regular lower body clothing?: A Little 6 Click Score: 21   End of Session Nurse Communication: Mobility status;Other (comment) (nausea)  Activity Tolerance: Treatment limited secondary to medical complications (Comment) (nausea and vomiting) Patient left: in bed;with call bell/phone within reach;with family/visitor present  OT Visit Diagnosis: Unsteadiness on feet (R26.81)                Time: 1051-1110 OT Time Calculation (min): 19 min Charges:  OT General Charges $OT Visit: 1 Visit OT Evaluation $OT Eval Low Complexity: 1 Low OT Treatments $Therapeutic Activity: 8-22 mins  Jackquline Denmark, MS, OTR/L , CBIS ascom [phone]  10/27/21, 1:30 PM

## 2021-10-28 DIAGNOSIS — R42 Dizziness and giddiness: Secondary | ICD-10-CM | POA: Diagnosis not present

## 2021-10-28 LAB — GLUCOSE, CAPILLARY
Glucose-Capillary: 93 mg/dL (ref 70–99)
Glucose-Capillary: 112 mg/dL — ABNORMAL HIGH (ref 70–99)

## 2021-10-28 MED ORDER — MECLIZINE HCL 25 MG PO TABS
25.0000 mg | ORAL_TABLET | Freq: Three times a day (TID) | ORAL | Status: DC
  Administered 2021-10-28 – 2021-10-29 (×3): 25 mg via ORAL
  Filled 2021-10-28 (×5): qty 1

## 2021-10-28 NOTE — Progress Notes (Addendum)
PROGRESS NOTE    Elizabeth [name]  [id number] DOB: 07-Dec-1927 DOA: 10/26/2021 PCP: Einar Pheasant, MD   Chief Complain:Vertigo  Brief Narrative: Elizabeth [name] is a 87 y.o. female with medical history significant of hypertension, hyperlipidemia, diet-controlled diabetes, GERD, vertigo, SVT, IBS, cystocele, CKD-3, who presents with vertigo.  Patient was also reporting history of diarrhea.  On presentation lab work showed WBC count of 15.2, BNP of 521, lactic acid of 3.5.  CT head negative for intracranial normalities.  MRI was negative for stroke.  Chest x-ray was negative for pneumonia.  Patient was admitted for further management.  Her vertigo resolved in the morning of 11/4/8 and she was planned for discharge but she again had a severe episode of vertigo, discharge had to be canceled.  She still has some on and off episodes of dizziness so discharge has been delayed.  Assessment & Plan:   Principal Problem:   Vertigo Active Problems:   Hypertension   Hypercholesterolemia   CKD (chronic kidney disease), stage IIIa   Elevated troponin   Leukocytosis   Elevated lactic acid level   Nausea vomiting and diarrhea   Vertigo: MRI of the brain is negative for stroke. Had episode like this in the past.  Follows with ENT.  We recommend to follow-up with ENT as an outpatient.  We recommend to follow-up with outpatient vestibular therapy. Continue meclizine 25 mg 3 times daily.  Use Benadryl for 25 mg IV as needed and as needed diazepam for severe vertigo   Hypertension - blood pressure stable.  Continue home medications   Hypercholesterolemia -On Lipitor.  LDL of 78   CKD (chronic kidney disease), stage IIIa: Stable. -Monitor kidney function as an outpatient   Elevated troponin: Troponin level is mildly elevated 7, 20, 36, no chest pain.  Possibly due to demand ischemia -Aspirin, Lipitor, metoprolol to be continued on discharge   Leukocytosis and Elevated lactic acid level: Patient  has WBC 15.2.  Lactic acid is elevated 3.5.  No source of infection identified.  Urinalysis negative except for trace amount of leukocyte, but patient does not have symptoms of UTI.  Chest x-ray negative.  No fever.  No source of infection identified so far.  Elevated lactic acid may be due to dehydration secondary to nausea, vomiting and diarrhea. Lactic acid level and white cell count significantly improved and normalized   Nausea vomiting and diarrhea: Patient's nausea and vomiting is likely related to vertigo.  Etiology for diarrhea is not clear.  All these  symptoms have resolved         DVT prophylaxis:Lovenox Code Status: DNR Family Communication: Called son for update on 10/28/21 Status is: Observation  The patient remains OBS appropriate and will d/c before 2 midnights.    Consultants: None  Procedures:None  Antimicrobials:  Anti-infectives (From admission, onward)    None       Subjective:  Patient seen and examined at the bedside this morning and afternoon.  She remains hemodynamically stable.  She has not had an episode of severe vertigo since yesterday but she still has short episodes of dizziness when she moves her head in certain direction.  We discussed about discharge planning tomorrow AM if she continues to do better.   Objective: Vitals:   10/27/21 2053 10/28/21 0528 10/28/21 0800 10/28/21 1218  BP: (!) 155/73 (!) 152/76 (!) 152/64 137/68  Pulse: 73 65 63 66  Resp: 16 16 16 15   Temp: 98.2 F (36.8 C) 98 F (36.7 C)  98.3 F (36.8 C) 98.2 F (36.8 C)  TempSrc: Oral Oral Oral Oral  SpO2: 94% 95% 95% 99%  Weight:      Height:        Intake/Output Summary (Last 24 hours) at 10/28/2021 1418 Last data filed at 10/28/2021 1039 Gross per 24 hour  Intake 2235.06 ml  Output 1250 ml  Net 985.06 ml   Filed Weights   10/25/21 2005  Weight: 63.5 kg    Examination:  General exam: Overall comfortable, not in distress, pleasant elderly female HEENT:  PERRL, hard of hearing Respiratory system:  no wheezes or crackles  Cardiovascular system: S1 & S2 heard, RRR.  Gastrointestinal system: Abdomen is nondistended, soft and nontender. Central nervous system: Alert and oriented Extremities: No edema, no clubbing ,no cyanosis Skin: No rashes, no ulcers,no icterus       Data Reviewed: I have personally reviewed following labs and imaging studies  CBC: Recent Labs  Lab 10/25/21 2007 10/27/21 0725  WBC 15.2* 7.5  HGB 14.3 12.4  HCT 44.4 37.9  MCV 93.9 94.3  PLT 266 [id number]   Basic Metabolic Panel: Recent Labs  Lab 10/25/21 2007 10/27/21 0725  NA 135 136  K 3.9 4.1  CL 105 107  CO2 16* 23  GLUCOSE 133* 90  BUN 31* 22  CREATININE 1.14* 1.08*  CALCIUM 9.1 8.3*   GFR: Estimated Creatinine Clearance: 28.4 mL/min (A) (by C-G formula based on SCr of 1.08 mg/dL (H)). Liver Function Tests: Recent Labs  Lab 10/26/21 0025  AST 36  ALT 26  ALKPHOS 61  BILITOT 0.7  PROT 7.7  ALBUMIN 4.7   Recent Labs  Lab 10/26/21 0025  LIPASE 24   No results for input(s): AMMONIA in the last 168 hours. Coagulation Profile: No results for input(s): INR, PROTIME in the last 168 hours. Cardiac Enzymes: No results for input(s): CKTOTAL, CKMB, CKMBINDEX, TROPONINI in the last 168 hours. BNP (last 3 results) No results for input(s): PROBNP in the last 8760 hours. HbA1C: Recent Labs    10/26/21 1454  HGBA1C 6.4*   CBG: Recent Labs  Lab 10/27/21 0755 10/28/21 0800 10/28/21 1220  GLUCAP 93 93 112*   Lipid Profile: Recent Labs    10/27/21 0725  CHOL 140  HDL 43  LDLCALC 78  TRIG 94  CHOLHDL 3.3   Thyroid Function Tests: No results for input(s): TSH, T4TOTAL, FREET4, T3FREE, THYROIDAB in the last 72 hours. Anemia Panel: No results for input(s): VITAMINB12, FOLATE, FERRITIN, TIBC, IRON, RETICCTPCT in the last 72 hours. Sepsis Labs: Recent Labs  Lab 10/26/21 1058 10/26/21 1454 10/27/21 0937  PROCALCITON  --  <0.10  --    LATICACIDVEN 3.5*  --  1.6    Recent Results (from the past 240 hour(s))  Resp Panel by RT-PCR (Flu A&B, Covid) Nasopharyngeal Swab     Status: None   Collection Time: 10/26/21  3:51 AM   Specimen: Nasopharyngeal Swab; Nasopharyngeal(NP) swabs in vial transport medium  Result Value Ref Range Status   SARS Coronavirus 2 by RT PCR NEGATIVE NEGATIVE Final    Comment: (NOTE) SARS-CoV-2 target nucleic acids are NOT DETECTED.  The SARS-CoV-2 RNA is generally detectable in upper respiratory specimens during the acute phase of infection. The lowest concentration of SARS-CoV-2 viral copies this assay can detect is 138 copies/mL. A negative result does not preclude SARS-Cov-2 infection and should not be used as the sole basis for treatment or other patient management decisions. A negative result may occur with  improper specimen collection/handling, submission of specimen other than nasopharyngeal swab, presence of viral mutation(s) within the areas targeted by this assay, and inadequate number of viral copies(<138 copies/mL). A negative result must be combined with clinical observations, patient history, and epidemiological information. The expected result is Negative.  Fact Sheet for Patients:  BloggerCourse.com  Fact Sheet for Healthcare Providers:  SeriousBroker.it  This test is no t yet approved or cleared by the [place] FDA and  has been authorized for detection and/or diagnosis of SARS-CoV-2 by FDA under an Emergency Use Authorization (EUA). This EUA will remain  in effect (meaning this test can be used) for the duration of the COVID-19 declaration under Section 564(b)(1) of the Act, 21 U.S.C.section 360bbb-3(b)(1), unless the authorization is terminated  or revoked sooner.       Influenza A by PCR NEGATIVE NEGATIVE Final   Influenza B by PCR NEGATIVE NEGATIVE Final    Comment: (NOTE) The Xpert Xpress SARS-CoV-2/FLU/RSV  plus assay is intended as an aid in the diagnosis of influenza from Nasopharyngeal swab specimens and should not be used as a sole basis for treatment. Nasal washings and aspirates are unacceptable for Xpert Xpress SARS-CoV-2/FLU/RSV testing.  Fact Sheet for Patients: BloggerCourse.com  Fact Sheet for Healthcare Providers: SeriousBroker.it  This test is not yet approved or cleared by the [place] FDA and has been authorized for detection and/or diagnosis of SARS-CoV-2 by FDA under an Emergency Use Authorization (EUA). This EUA will remain in effect (meaning this test can be used) for the duration of the COVID-19 declaration under Section 564(b)(1) of the Act, 21 U.S.C. section 360bbb-3(b)(1), unless the authorization is terminated or revoked.  Performed at [facility], [address], [place], [place] [address]   Urine Culture     Status: Abnormal   Collection Time: 10/26/21  3:51 AM   Specimen: Urine, Random  Result Value Ref Range Status   Specimen Description   Final    URINE, RANDOM Performed at [facility], [address], [place], [place] [address]    Special Requests   Final    NONE Performed at [facility], [address] [place], [place] [address]    Culture MULTIPLE SPECIES PRESENT, SUGGEST RECOLLECTION (A)  Final   Report Status 10/27/2021 FINAL  Final  CULTURE, BLOOD (ROUTINE X 2) w Reflex to ID Panel     Status: None (Preliminary result)   Collection Time: 10/26/21 10:58 AM   Specimen: BLOOD  Result Value Ref Range Status   Specimen Description BLOOD RW  Final   Special Requests   Final    BOTTLES DRAWN AEROBIC AND ANAEROBIC Blood Culture results may not be optimal due to an inadequate volume of blood received in culture bottles   Culture   Final    NO GROWTH 2 DAYS Performed at [facility], [address] [place], [place] [address]    Report Status PENDING   Incomplete  CULTURE, BLOOD (ROUTINE X 2) w Reflex to ID Panel     Status: Abnormal (Preliminary result)   Collection Time: 10/26/21 10:59 AM   Specimen: BLOOD  Result Value Ref Range Status   Specimen Description   Final    BLOOD LW Performed at [facility], [address] [place] [address], [place], [place] [address]    Special Requests   Final    BOTTLES DRAWN AEROBIC AND ANAEROBIC Blood Culture results may not be optimal due to an inadequate volume of blood received in culture bottles  Performed at [facility], [address] [place], [place] [address]    Culture  Setup Time   Final    GRAM POSITIVE COCCI ANAEROBIC BOTTLE ONLY CRITICAL RESULT CALLED TO, READ BACK BY AND VERIFIED WITH: NATHAN BOYD AT [id number] 10/27/21.PMF    Culture (A)  Final    VIRIDANS STREPTOCOCCUS THE SIGNIFICANCE OF ISOLATING THIS ORGANISM FROM A SINGLE SET OF BLOOD CULTURES WHEN MULTIPLE SETS ARE DRAWN IS UNCERTAIN. PLEASE NOTIFY THE MICROBIOLOGY DEPARTMENT WITHIN ONE WEEK IF SPECIATION AND SENSITIVITIES ARE REQUIRED. Performed at [facility], [place] [address], [place], [place] [address]    Report Status PENDING  Incomplete  Blood Culture ID Panel (Reflexed)     Status: Abnormal   Collection Time: 10/26/21 10:59 AM  Result Value Ref Range Status   Enterococcus faecalis NOT DETECTED NOT DETECTED Final   Enterococcus Faecium NOT DETECTED NOT DETECTED Final   Listeria monocytogenes NOT DETECTED NOT DETECTED Final   Staphylococcus species NOT DETECTED NOT DETECTED Final   Staphylococcus aureus (BCID) NOT DETECTED NOT DETECTED Final   Staphylococcus epidermidis NOT DETECTED NOT DETECTED Final   Staphylococcus lugdunensis NOT DETECTED NOT DETECTED Final   Streptococcus species DETECTED (A) NOT DETECTED Final    Comment: Not Enterococcus species, Streptococcus agalactiae, Streptococcus pyogenes, or Streptococcus pneumoniae. CRITICAL RESULT CALLED TO, READ BACK BY AND VERIFIED WITH: NATHAN BOYD AT [id number]  10/27/21.PMF    Streptococcus agalactiae NOT DETECTED NOT DETECTED Final   Streptococcus pneumoniae NOT DETECTED NOT DETECTED Final   Streptococcus pyogenes NOT DETECTED NOT DETECTED Final   A.calcoaceticus-baumannii NOT DETECTED NOT DETECTED Final   Bacteroides fragilis NOT DETECTED NOT DETECTED Final   Enterobacterales NOT DETECTED NOT DETECTED Final   Enterobacter cloacae complex NOT DETECTED NOT DETECTED Final   Escherichia coli NOT DETECTED NOT DETECTED Final   Klebsiella aerogenes NOT DETECTED NOT DETECTED Final   Klebsiella oxytoca NOT DETECTED NOT DETECTED Final   Klebsiella pneumoniae NOT DETECTED NOT DETECTED Final   Proteus species NOT DETECTED NOT DETECTED Final   Salmonella species NOT DETECTED NOT DETECTED Final   Serratia marcescens NOT DETECTED NOT DETECTED Final   Haemophilus influenzae NOT DETECTED NOT DETECTED Final   Neisseria meningitidis NOT DETECTED NOT DETECTED Final   Pseudomonas aeruginosa NOT DETECTED NOT DETECTED Final   Stenotrophomonas maltophilia NOT DETECTED NOT DETECTED Final   Candida albicans NOT DETECTED NOT DETECTED Final   Candida auris NOT DETECTED NOT DETECTED Final   Candida glabrata NOT DETECTED NOT DETECTED Final   Candida krusei NOT DETECTED NOT DETECTED Final   Candida parapsilosis NOT DETECTED NOT DETECTED Final   Candida tropicalis NOT DETECTED NOT DETECTED Final   Cryptococcus neoformans/gattii NOT DETECTED NOT DETECTED Final    Comment: Performed at [facility], [address], [place],  [address]         Radiology Studies: No results found.      Scheduled Meds:  aspirin  81 mg Oral Daily   atorvastatin  40 mg Oral QHS   enoxaparin (LOVENOX) injection  30 mg Subcutaneous Q24H   loratadine  10 mg Oral Daily   meclizine  25 mg Oral TID   metoprolol tartrate  25 mg Oral BID   quinapril  40 mg Oral Daily   Continuous Infusions:   LOS: 0 days    Time spent: 25 mins.More than 50% of that time was spent  in counseling and/or coordination of care.      Shelly Coss, MD [facility]  P11/04/2021, 2:18 PM

## 2021-10-29 DIAGNOSIS — R42 Dizziness and giddiness: Secondary | ICD-10-CM | POA: Diagnosis not present

## 2021-10-29 LAB — CULTURE, BLOOD (ROUTINE X 2)

## 2021-10-29 LAB — CBC
HCT: 37.8 % (ref 36.0–46.0)
Hemoglobin: 12.4 g/dL (ref 12.0–15.0)
MCH: 30.3 pg (ref 26.0–34.0)
MCHC: 32.8 g/dL (ref 30.0–36.0)
MCV: 92.4 fL (ref 80.0–100.0)
Platelets: 210 10*3/uL (ref 150–400)
RBC: 4.09 MIL/uL (ref 3.87–5.11)
RDW: 13.7 % (ref 11.5–15.5)
WBC: 6.8 10*3/uL (ref 4.0–10.5)
nRBC: 0 % (ref 0.0–0.2)

## 2021-10-29 LAB — GLUCOSE, CAPILLARY: Glucose-Capillary: 109 mg/dL — ABNORMAL HIGH (ref 70–99)

## 2021-10-29 MED ORDER — ONDANSETRON HCL 4 MG PO TABS: 4.0000 mg | ORAL_TABLET | Freq: Every day | ORAL | 1 refills | Status: AC | PRN

## 2021-10-29 NOTE — Progress Notes (Addendum)
PROGRESS NOTE    Elizabeth [name]  [id number] DOB: 11/15/1927 DOA: 10/26/2021 PCP: Einar Pheasant, MD   Chief Complain:Vertigo  Brief Narrative: Elizabeth [name] is a 87 y.o. female with medical history significant of hypertension, hyperlipidemia, diet-controlled diabetes, GERD, vertigo, SVT, IBS, cystocele, CKD-3, who presents with vertigo.  Patient was also reporting history of diarrhea.  On presentation lab work showed WBC count of 15.2, BNP of 521, lactic acid of 3.5.  CT head negative for intracranial normalities.  MRI was negative for stroke.  Chest x-ray was negative for pneumonia.  Patient was admitted for further management.  Her vertigo resolved in the morning of 11/4/8 and she was planned for discharge but she again had a severe episode of vertigo, discharge had to be canceled.  She feels much better today.  She is walking to the bathroom.  She is still having some on and off source of dizziness but she is medically stable for discharge to home.  She will continue meclizine or Dramamine at home.  We have recommended her to follow-up with her PCP, vestibular therapy as an outpatient and ENT. Assessment & Plan:   Principal Problem:   Vertigo Active Problems:   Hypertension   Hypercholesterolemia   CKD (chronic kidney disease), stage IIIa   Elevated troponin   Leukocytosis   Elevated lactic acid level   Nausea vomiting and diarrhea   Vertigo: MRI of the brain is negative for stroke. Had episode like this in the past.   We recommend to follow-up with ENT as an outpatient.  We recommend to follow-up with outpatient vestibular therapy. Continue meclizine 25 mg 3 times daily as needed.     Hypertension - blood pressure stable.  Continue home medications   Hypercholesterolemia -On Lipitor.  LDL of 78   CKD (chronic kidney disease), stage IIIa: Stable. -Monitor kidney function as an outpatient   Elevated troponin: Troponin level is mildly elevated 7, 20, 36, no chest pain.   Possibly due to demand ischemia -Aspirin, Lipitor, metoprolol to be continued on discharge   Leukocytosis and Elevated lactic acid level: Patient has WBC 15.2.  Lactic acid is elevated 3.5.    Urinalysis negative except for trace amount of leukocyte, but patient does not have symptoms of UTI.  Chest x-ray negative.  No fever.  No source of infection identified so far.  Elevated lactic acid may be due to dehydration secondary to nausea, vomiting and diarrhea. Lactic acid level and white cell count significantly improved and normalized.  One of the blood cultures showed strept viridans, most likely contaminant.  Repeat blood cultures have been negative so far   Nausea vomiting and diarrhea: Patient's nausea and vomiting is likely related to vertigo.  Etiology for diarrhea is not clear.  All these  symptoms have resolved         DVT prophylaxis:Lovenox Code Status: DNR Family Communication: Called son for update on 10/28/21 Status is: Observation  The patient remains OBS appropriate and will d/c before 2 midnights.    Consultants: None  Procedures:None  Antimicrobials:  Anti-infectives (From admission, onward)    None       Subjective:  Patient seen and examined the bedside this morning.  Hemodynamically stable.  She says she had a mild headache, feeling some fullness but does not have any acute episodes of vertigo.  Denies any nausea or vomiting.  Daughter at the bedside and discussed  about the discharge planning.   Objective: Vitals:   10/28/21 2124 10/29/21  0105 10/29/21 0324 10/29/21 0755  BP: (!) 150/69 (!) 167/71 (!) 150/64 (!) 172/78  Pulse: 65 78 65 66  Resp: 16  16 17   Temp: 98 F (36.7 C) 97.9 F (36.6 C) 98 F (36.7 C) 98 F (36.7 C)  TempSrc: Oral Oral Oral   SpO2: 96%  96% 96%  Weight:      Height:        Intake/Output Summary (Last 24 hours) at 10/29/2021 [id number] Last data filed at 10/28/2021 1859 Gross per 24 hour  Intake 1192.2 ml  Output --  Net  1192.2 ml   Filed Weights   10/25/21 2005  Weight: 63.5 kg    Examination:  General exam: Overall comfortable, not in distress,pleasant elderly female HEENT: PERRL Respiratory system:  no wheezes or crackles  Cardiovascular system: S1 & S2 heard, RRR.  Gastrointestinal system: Abdomen is nondistended, soft and nontender. Central nervous system: Alert and oriented Extremities: No edema, no clubbing ,no cyanosis Skin: No rashes, no ulcers,no icterus         Data Reviewed: I have personally reviewed following labs and imaging studies  CBC: Recent Labs  Lab 10/25/21 2007 10/27/21 0725 10/29/21 0421  WBC 15.2* 7.5 6.8  HGB 14.3 12.4 12.4  HCT 44.4 37.9 37.8  MCV 93.9 94.3 92.4  PLT 266 203 [id number]   Basic Metabolic Panel: Recent Labs  Lab 10/25/21 2007 10/27/21 0725  NA 135 136  K 3.9 4.1  CL 105 107  CO2 16* 23  GLUCOSE 133* 90  BUN 31* 22  CREATININE 1.14* 1.08*  CALCIUM 9.1 8.3*   GFR: Estimated Creatinine Clearance: 28.4 mL/min (A) (by C-G formula based on SCr of 1.08 mg/dL (H)). Liver Function Tests: Recent Labs  Lab 10/26/21 0025  AST 36  ALT 26  ALKPHOS 61  BILITOT 0.7  PROT 7.7  ALBUMIN 4.7   Recent Labs  Lab 10/26/21 0025  LIPASE 24   No results for input(s): AMMONIA in the last 168 hours. Coagulation Profile: No results for input(s): INR, PROTIME in the last 168 hours. Cardiac Enzymes: No results for input(s): CKTOTAL, CKMB, CKMBINDEX, TROPONINI in the last 168 hours. BNP (last 3 results) No results for input(s): PROBNP in the last 8760 hours. HbA1C: Recent Labs    10/26/21 1454  HGBA1C 6.4*   CBG: Recent Labs  Lab 10/27/21 0755 10/28/21 0800 10/28/21 1220 10/29/21 0846  GLUCAP 93 93 112* 109*   Lipid Profile: Recent Labs    10/27/21 0725  CHOL 140  HDL 43  LDLCALC 78  TRIG 94  CHOLHDL 3.3   Thyroid Function Tests: No results for input(s): TSH, T4TOTAL, FREET4, T3FREE, THYROIDAB in the last 72 hours. Anemia  Panel: No results for input(s): VITAMINB12, FOLATE, FERRITIN, TIBC, IRON, RETICCTPCT in the last 72 hours. Sepsis Labs: Recent Labs  Lab 10/26/21 1058 10/26/21 1454 10/27/21 0937  PROCALCITON  --  <0.10  --   LATICACIDVEN 3.5*  --  1.6    Recent Results (from the past 240 hour(s))  Resp Panel by RT-PCR (Flu A&B, Covid) Nasopharyngeal Swab     Status: None   Collection Time: 10/26/21  3:51 AM   Specimen: Nasopharyngeal Swab; Nasopharyngeal(NP) swabs in vial transport medium  Result Value Ref Range Status   SARS Coronavirus 2 by RT PCR NEGATIVE NEGATIVE Final    Comment: (NOTE) SARS-CoV-2 target nucleic acids are NOT DETECTED.  The SARS-CoV-2 RNA is generally detectable in upper respiratory specimens during the acute phase of infection. The  lowest concentration of SARS-CoV-2 viral copies this assay can detect is 138 copies/mL. A negative result does not preclude SARS-Cov-2 infection and should not be used as the sole basis for treatment or other patient management decisions. A negative result may occur with  improper specimen collection/handling, submission of specimen other than nasopharyngeal swab, presence of viral mutation(s) within the areas targeted by this assay, and inadequate number of viral copies(<138 copies/mL). A negative result must be combined with clinical observations, patient history, and epidemiological information. The expected result is Negative.  Fact Sheet for Patients:  BloggerCourse.comhttps://www.fda.gov/media/152166/download  Fact Sheet for Healthcare Providers:  SeriousBroker.ithttps://www.fda.gov/media/152162/download  This test is no t yet approved or cleared by the [place] FDA and  has been authorized for detection and/or diagnosis of SARS-CoV-2 by FDA under an Emergency Use Authorization (EUA). This EUA will remain  in effect (meaning this test can be used) for the duration of the COVID-19 declaration under Section 564(b)(1) of the Act, 21 U.S.C.section 360bbb-3(b)(1),  unless the authorization is terminated  or revoked sooner.       Influenza A by PCR NEGATIVE NEGATIVE Final   Influenza B by PCR NEGATIVE NEGATIVE Final    Comment: (NOTE) The Xpert Xpress SARS-CoV-2/FLU/RSV plus assay is intended as an aid in the diagnosis of influenza from Nasopharyngeal swab specimens and should not be used as a sole basis for treatment. Nasal washings and aspirates are unacceptable for Xpert Xpress SARS-CoV-2/FLU/RSV testing.  Fact Sheet for Patients: BloggerCourse.comhttps://www.fda.gov/media/152166/download  Fact Sheet for Healthcare Providers: SeriousBroker.ithttps://www.fda.gov/media/152162/download  This test is not yet approved or cleared by the [place] FDA and has been authorized for detection and/or diagnosis of SARS-CoV-2 by FDA under an Emergency Use Authorization (EUA). This EUA will remain in effect (meaning this test can be used) for the duration of the COVID-19 declaration under Section 564(b)(1) of the Act, 21 U.S.C. section 360bbb-3(b)(1), unless the authorization is terminated or revoked.  Performed at [facility], [address] [place], [place] [address]   Urine Culture     Status: Abnormal   Collection Time: 10/26/21  3:51 AM   Specimen: Urine, Random  Result Value Ref Range Status   Specimen Description   Final    URINE, RANDOM Performed at [facility], [address] [place], [place] [address]    Special Requests   Final    NONE Performed at [facility], [address] [place], [place] [address]    Culture MULTIPLE SPECIES PRESENT, SUGGEST RECOLLECTION (A)  Final   Report Status 10/27/2021 FINAL  Final  CULTURE, BLOOD (ROUTINE X 2) w Reflex to ID Panel     Status: None (Preliminary result)   Collection Time: 10/26/21 10:58 AM   Specimen: BLOOD  Result Value Ref Range Status   Specimen Description BLOOD RW  Final   Special Requests   Final    BOTTLES DRAWN AEROBIC AND ANAEROBIC Blood Culture results may not be  optimal due to an inadequate volume of blood received in culture bottles   Culture   Final    NO GROWTH 3 DAYS Performed at [facility], [address] [place], [place] [address]    Report Status PENDING  Incomplete  CULTURE, BLOOD (ROUTINE X 2) w Reflex to ID Panel     Status: Abnormal   Collection Time: 10/26/21 10:59 AM   Specimen: BLOOD  Result Value Ref Range Status   Specimen Description   Final    BLOOD LW Performed at [facility], [address], [place], [place] [address]    Special Requests   Final    BOTTLES DRAWN AEROBIC AND ANAEROBIC Blood Culture results may not be optimal due to an inadequate volume of blood received in culture bottles Performed at [facility], [address] [place], [place] [address]    Culture  Setup Time   Final    GRAM POSITIVE COCCI ANAEROBIC BOTTLE ONLY CRITICAL RESULT CALLED TO, READ BACK BY AND VERIFIED WITH: NATHAN BOYD AT 1751 10/27/21.PMF    Culture (A)  Final    VIRIDANS STREPTOCOCCUS THE SIGNIFICANCE OF ISOLATING THIS ORGANISM FROM A SINGLE SET OF BLOOD CULTURES WHEN MULTIPLE SETS ARE DRAWN IS UNCERTAIN. PLEASE NOTIFY THE MICROBIOLOGY DEPARTMENT WITHIN ONE WEEK IF SPECIATION AND SENSITIVITIES ARE REQUIRED. Performed at [facility], [address], [place], [place] [address]    Report Status 10/29/2021 FINAL  Final  Blood Culture ID Panel (Reflexed)     Status: Abnormal   Collection Time: 10/26/21 10:59 AM  Result Value Ref Range Status   Enterococcus faecalis NOT DETECTED NOT DETECTED Final   Enterococcus Faecium NOT DETECTED NOT DETECTED Final   Listeria monocytogenes NOT DETECTED NOT DETECTED Final   Staphylococcus species NOT DETECTED NOT DETECTED Final   Staphylococcus aureus (BCID) NOT DETECTED NOT DETECTED Final   Staphylococcus epidermidis NOT DETECTED NOT DETECTED Final   Staphylococcus lugdunensis NOT DETECTED NOT DETECTED Final   Streptococcus species DETECTED (A) NOT DETECTED Final     Comment: Not Enterococcus species, Streptococcus agalactiae, Streptococcus pyogenes, or Streptococcus pneumoniae. CRITICAL RESULT CALLED TO, READ BACK BY AND VERIFIED WITH: NATHAN BOYD AT 2778 10/27/21.PMF    Streptococcus agalactiae NOT DETECTED NOT DETECTED Final   Streptococcus pneumoniae NOT DETECTED NOT DETECTED Final   Streptococcus pyogenes NOT DETECTED NOT DETECTED Final   A.calcoaceticus-baumannii NOT DETECTED NOT DETECTED Final   Bacteroides fragilis NOT DETECTED NOT DETECTED Final   Enterobacterales NOT DETECTED NOT DETECTED Final   Enterobacter cloacae complex NOT DETECTED NOT DETECTED Final   Escherichia coli NOT DETECTED NOT DETECTED Final   Klebsiella aerogenes NOT DETECTED NOT DETECTED Final   Klebsiella oxytoca NOT DETECTED NOT DETECTED Final   Klebsiella pneumoniae NOT DETECTED NOT DETECTED Final   Proteus species NOT DETECTED NOT DETECTED Final   Salmonella species NOT DETECTED NOT DETECTED Final   Serratia marcescens NOT DETECTED NOT DETECTED Final   Haemophilus influenzae NOT DETECTED NOT DETECTED Final   Neisseria meningitidis NOT DETECTED NOT DETECTED Final   Pseudomonas aeruginosa NOT DETECTED NOT DETECTED Final   Stenotrophomonas maltophilia NOT DETECTED NOT DETECTED Final   Candida albicans NOT DETECTED NOT DETECTED Final   Candida auris NOT DETECTED NOT DETECTED Final   Candida glabrata NOT DETECTED NOT DETECTED Final   Candida krusei NOT DETECTED NOT DETECTED Final   Candida parapsilosis NOT DETECTED NOT DETECTED Final   Candida tropicalis NOT DETECTED NOT DETECTED Final   Cryptococcus neoformans/gattii NOT DETECTED NOT DETECTED Final    Comment: Performed at [facility], [address] [place], [place] [address]  CULTURE, BLOOD (ROUTINE X 2) w Reflex to ID Panel     Status: None (Preliminary result)   Collection Time: 10/28/21  3:28 PM   Specimen: BLOOD LEFT HAND  Result Value Ref Range Status   Specimen Description BLOOD LEFT HAND  Final    Special Requests   Final    BOTTLES DRAWN AEROBIC AND ANAEROBIC Blood Culture adequate volume   Culture   Final  NO GROWTH < 24 HOURS Performed at [facility], [address] [place], [place] [address]    Report Status PENDING  Incomplete  CULTURE, BLOOD (ROUTINE X 2) w Reflex to ID Panel     Status: None (Preliminary result)   Collection Time: 10/28/21  3:28 PM   Specimen: BLOOD RIGHT HAND  Result Value Ref Range Status   Specimen Description BLOOD RIGHT HAND  Final   Special Requests   Final    BOTTLES DRAWN AEROBIC AND ANAEROBIC Blood Culture adequate volume   Culture   Final    NO GROWTH < 24 HOURS Performed at [facility], [address] [place], [place] [address]    Report Status PENDING  Incomplete         Radiology Studies: No results found.      Scheduled Meds:  aspirin  81 mg Oral Daily   atorvastatin  40 mg Oral QHS   enoxaparin (LOVENOX) injection  30 mg Subcutaneous Q24H   loratadine  10 mg Oral Daily   meclizine  25 mg Oral TID   metoprolol tartrate  25 mg Oral BID   quinapril  40 mg Oral Daily   Continuous Infusions:   LOS: 0 days    Time spent: 25 mins.More than 50% of that time was spent in counseling and/or coordination of care.      Shelly Coss, MD [facility] P11/05/2021, 9:27 AM

## 2021-10-29 NOTE — Progress Notes (Signed)
MD order received in [facility] to discharge pt home today; verbally reviewed AVS with pt, Rxs escribed to [facility], no questions voiced by the pt at this time; pt discharged via wheelchair by auxillary to [facility]

## 2021-10-30 ENCOUNTER — Telehealth: Payer: Self-pay | Admitting: Cardiovascular Disease

## 2021-10-30 ENCOUNTER — Telehealth: Payer: Self-pay

## 2021-10-30 ENCOUNTER — Telehealth: Payer: Self-pay | Admitting: Internal Medicine

## 2021-10-30 NOTE — Telephone Encounter (Signed)
Patient wants to know if she needs fu from vertigo visit [facility].

## 2021-10-30 NOTE — Telephone Encounter (Signed)
Transition Care Management Unsuccessful Follow-up Telephone Call  Date of discharge and from where:  10/29/21-[facility]  Attempts:  1st Attempt  Reason for unsuccessful TCM follow-up call:  Unable to reach patient

## 2021-10-30 NOTE — Telephone Encounter (Signed)
Pt called in stating she has been in the hospital from Wednesday 11/2- today 11/7 because of severe vertigo. Pt says she was released today and told to follow up with PCP. Pt states she is home but still isn't feeling well and this is the worst she's ever experienced.

## 2021-10-30 NOTE — Telephone Encounter (Signed)
Able to reach back out to Mrs. Butzin, reports seen in the ED for hx of vertigo, they advised to f/u with PCP and cardiologist. Pt reports the ED provider stated her labs look good.  Noted ED visit notes stated elevated troponin  Pt reports feels fine other then her constant vertigo, making ADLs harder then usual. Okay to go ahead and make f/u appt, does not want this week.  Schedule for 11/18 with Cadence Furth PA-C at 1:55 pm. Pt good with the appt, stated if need to cancel she will call to do so in advance. Otherwise all questions were address and no additional concerns at this time. Agreeable to plan, will call back for anything further.

## 2021-10-31 NOTE — Telephone Encounter (Signed)
Ok to work in Friday at 12:00.  Also agree with sending to [facility] for TCM.

## 2021-10-31 NOTE — Telephone Encounter (Signed)
Transition Care Management Follow-up Telephone Call Date of discharge and from where: 10/29/21 [facility]. How have you been since you were released from the hospital? Patient states,"I still has vertigo and intermittent headaches. My allergies have been bad this season and I think that has contributed to the vertigo. I am not as hungry since I have been home and am just sitting as still as I can, in doing that everything gets better but doesn't resolve. I am trying to drink a lot of fluids and have heaviness in both ears with intermittent clicking in L ear." Denies V/N/D, falls, confusion, blurred vision and all other symptoms.  Any questions or concerns? No  Items Reviewed: Did the pt receive and understand the discharge instructions provided? Yes  Medications obtained and verified? Yes  Any new allergies since your discharge? No Dietary orders reviewed? Yes Do you have support at home? Yes   Home Care and Equipment/Supplies: Were home health services ordered? no  Functional Questionnaire: (I = Independent and D = Dependent) ADLs: I, paces self. However has contacted friends and confirmed they will assist if needed with bathing and dressing.   Bathing/Dressing- I  Meal Prep- I  Eating- I  Maintaining continence- I  Transferring/Ambulation- Walker in use while waiting for vertigo to resolve.   Managing Meds- I  Follow up appointments reviewed:  PCP Hospital f/u appt confirmed? Yes  Scheduled to see PCP on 11/03/21 @ 12:00. Specialist Hospital f/u appt confirmed? Yes  Scheduled to see Cardiology on 11/10/21.  Are transportation arrangements needed? No  If their condition worsens, is the pt aware to call PCP or go to the Emergency Dept.? Yes Was the patient provided with contact information for the PCP's office or ED? Yes Was to pt encouraged to call back with questions or concerns? Yes

## 2021-10-31 NOTE — Telephone Encounter (Signed)
Pt scheduled  

## 2021-10-31 NOTE — Telephone Encounter (Signed)
Called patient to confirm doing ok. She was calling to schedule an appointment with you as directed by the discharge summary from the hospital. She has had vertigo for years and this time she just can't seem to get it to resolved. Was discharged from the hospital yesterday and scheduled f/u with Dr Mariah Milling next week. Wants to see you since you know her history and have dealt with this with her. She is using dramamine prn as directed by MD in hospital. Prefers dramamine over meclizine- meclizine was never effective for her in the past. Patient stated that some times through out the day she is perfectly fine and the she has episodes where she is swimmy headed. She says that her left ear has started aching a little bit so she would like to have you look at them when you see her. Confirmed patient is doing ok other wise- no other acute symptoms. We have an 8:30 tomorrow and Thursday or we can see her at 12:00 on Thursday. Let me know if you have a preference where to schedule. Will forward to Denisa as well for TCM

## 2021-11-02 ENCOUNTER — Ambulatory Visit: Payer: Medicare Other | Admitting: Internal Medicine

## 2021-11-02 LAB — CULTURE, BLOOD (ROUTINE X 2)
Culture: NO GROWTH
Culture: NO GROWTH
Special Requests: ADEQUATE
Special Requests: ADEQUATE

## 2021-11-03 ENCOUNTER — Ambulatory Visit: Payer: Medicare Other | Admitting: Internal Medicine

## 2021-11-08 ENCOUNTER — Telehealth: Payer: Self-pay | Admitting: Internal Medicine

## 2021-11-08 ENCOUNTER — Ambulatory Visit: Payer: Medicare Other | Admitting: Internal Medicine

## 2021-11-08 ENCOUNTER — Other Ambulatory Visit: Payer: Self-pay

## 2021-11-08 VITALS — BP 126/70 | HR 70 | Temp 97.8°F | Resp 16 | Ht 61.0 in | Wt 140.0 lb

## 2021-11-08 DIAGNOSIS — E1165 Type 2 diabetes mellitus with hyperglycemia: Secondary | ICD-10-CM

## 2021-11-08 DIAGNOSIS — I471 Supraventricular tachycardia, unspecified: Secondary | ICD-10-CM

## 2021-11-08 DIAGNOSIS — R112 Nausea with vomiting, unspecified: Secondary | ICD-10-CM

## 2021-11-08 DIAGNOSIS — R7989 Other specified abnormal findings of blood chemistry: Secondary | ICD-10-CM

## 2021-11-08 DIAGNOSIS — R42 Dizziness and giddiness: Secondary | ICD-10-CM | POA: Diagnosis not present

## 2021-11-08 DIAGNOSIS — I700 Atherosclerosis of aorta: Secondary | ICD-10-CM | POA: Diagnosis not present

## 2021-11-08 DIAGNOSIS — D72829 Elevated white blood cell count, unspecified: Secondary | ICD-10-CM

## 2021-11-08 DIAGNOSIS — R197 Diarrhea, unspecified: Secondary | ICD-10-CM

## 2021-11-08 DIAGNOSIS — I10 Essential (primary) hypertension: Secondary | ICD-10-CM | POA: Diagnosis not present

## 2021-11-08 DIAGNOSIS — R519 Headache, unspecified: Secondary | ICD-10-CM

## 2021-11-08 DIAGNOSIS — R778 Other specified abnormalities of plasma proteins: Secondary | ICD-10-CM

## 2021-11-08 DIAGNOSIS — E7800 Pure hypercholesterolemia, unspecified: Secondary | ICD-10-CM

## 2021-11-08 NOTE — Telephone Encounter (Signed)
Pt daughter Arline Asp called in requesting for a callback. Pt daughter would like callback at [phone]

## 2021-11-08 NOTE — Patient Instructions (Signed)
Saline nasal spray - flush nose in the morning and afternoon.   Nasacort nasal spray - 2 sprays each nostril one time per day.  Do this in the evening.

## 2021-11-08 NOTE — Progress Notes (Signed)
hysi  Subjective:    Patient ID: Elizabeth [name], female    DOB: 06-15-1927, 87 y.o.   MRN: [id number]  This visit occurred during the SARS-CoV-2 public health emergency.  Safety protocols were in place, including screening questions prior to the visit, additional usage of staff PPE, and extensive cleaning of exam room while observing appropriate contact time as indicated for disinfecting solutions.   Patient here for  Chief Complaint  Patient presents with   Hospitalization Follow-up   .   HPI Recently admitted 10/26/21 - 10/29/21 - after presenting to hospital with vertigo.  Per ER notes, also reported diarrhea.  Pt reported having dry heaves.  CT head negative.  Wbc coung 15.2, BNP 521, lactic acid 3.5.  MRI negative for stroke.  CXR negative for pneumonia. Per notes, symptoms and labs improved while hospitalized.  PT/OT evaluated and recommended outpatient therapy (vestibular therapy).  Also recommended ENT. (Has seen them in the past for vertigo).  Prior to discharge, documented nausea, vomiting and diarrhea resolved.  Since her discharge, she reports that her head feels heavy.  Has some drainage, but no significant sinus symptoms.  No fever.  Some headaches.  Describes a clicking in her ears - left > right.  Still with dizziness/room moving.  Looking down aggravates.  Moving and certain movements aggravate.  No chest pain.  Breathing stable.  She is eating.  No vomiting.     Past Medical History:  Diagnosis Date   Cystocele or rectocele with incomplete uterine prolapse    Hyperlipidemia    Hypertension    Irritable bowel syndrome    Palpitations    Vaginal atrophy    Past Surgical History:  Procedure Laterality Date   CATARACT EXTRACTION  2006   Family History  Problem Relation Age of Onset   Transient ischemic attack Mother        multiple    Dementia Mother    Stroke Father    Heart disease Brother        MI - died age 22   Leukemia Brother    Lung cancer Brother    Throat  cancer Brother        had heart disease also   Heart disease Sister    Dementia Sister    Social History   Socioeconomic History   Marital status: Widowed    Spouse name: Not on file   Number of children: 2   Years of education: Not on file   Highest education level: Not on file  Occupational History   Not on file  Tobacco Use   Smoking status: Never   Smokeless tobacco: Never  Vaping Use   Vaping Use: Never used  Substance and Sexual Activity   Alcohol use: No    Alcohol/week: 0.0 standard drinks   Drug use: No   Sexual activity: Not Currently    Birth control/protection: None  Other Topics Concern   Not on file  Social History Narrative   Not on file   Social Determinants of Health   Financial Resource Strain: Low Risk    Difficulty of Paying Living Expenses: Not hard at all  Food Insecurity: No Food Insecurity   Worried About Charity fundraiser in the Last Year: Never true   [place] in the Last Year: Never true  Transportation Needs: No Transportation Needs   Lack of Transportation (Medical): No   Lack of Transportation (Non-Medical): No  Physical Activity: Insufficiently Active  Days of Exercise per Week: 7 days   Minutes of Exercise per Session: 20 min  Stress: No Stress Concern Present   Feeling of Stress : Not at all  Social Connections: Unknown   Frequency of Communication with Friends and Family: More than three times a week   Frequency of Social Gatherings with Friends and Family: Not on file   Attends Religious Services: Not on file   Active Member of Clubs or Organizations: Not on file   Attends Archivist Meetings: Not on file   Marital Status: Widowed     Review of Systems  Constitutional:  Negative for appetite change and unexpected weight change.  HENT:  Positive for postnasal drip.        No significant increased congestion.  Ears clicking as outlined.   Respiratory:  Negative for cough, chest tightness and shortness of  breath.   Cardiovascular:  Negative for chest pain, palpitations and leg swelling.  Gastrointestinal:  Negative for abdominal pain, diarrhea, nausea and vomiting.  Genitourinary:  Negative for difficulty urinating and dysuria.  Musculoskeletal:  Negative for joint swelling and myalgias.  Skin:  Negative for color change and rash.  Neurological:  Positive for dizziness, light-headedness and headaches.  Psychiatric/Behavioral:  Negative for agitation and dysphoric mood.       Objective:     BP 126/70   Pulse 70   Temp 97.8 F (36.6 C)   Resp 16   Ht _0  (1.549 m)   Wt 140 lb (63.5 kg)   SpO2 98%   BMI 26.45 kg/m  Wt Readings from Last 3 Encounters:  11/10/21 140 lb (63.5 kg)  11/08/21 140 lb (63.5 kg)  10/25/21 140 lb (63.5 kg)    Physical Exam Vitals reviewed.  Constitutional:      General: She is not in acute distress.    Appearance: Normal appearance.  HENT:     Head: Normocephalic and atraumatic.     Right Ear: External ear normal.     Left Ear: External ear normal.  Eyes:     General: No scleral icterus.       Right eye: No discharge.        Left eye: No discharge.     Conjunctiva/sclera: Conjunctivae normal.  Neck:     Thyroid: No thyromegaly.  Cardiovascular:     Rate and Rhythm: Normal rate and regular rhythm.  Pulmonary:     Effort: No respiratory distress.     Breath sounds: Normal breath sounds. No wheezing.  Abdominal:     General: Bowel sounds are normal.     Palpations: Abdomen is soft.     Tenderness: There is no abdominal tenderness.  Musculoskeletal:        General: No swelling or tenderness.     Cervical back: Neck supple. No tenderness.  Lymphadenopathy:     Cervical: No cervical adenopathy.  Skin:    Findings: No erythema or rash.  Neurological:     Mental Status: She is alert.     Comments: Increased dizziness with movement.  Noticed increased symptoms with looking down (especially with looking down to step off the exam table).    Movement of head aggravates.    Psychiatric:        Mood and Affect: Mood normal.        Behavior: Behavior normal.     Outpatient Encounter Medications as of 11/08/2021  Medication Sig   aspirin 81 MG tablet Take 81 mg by mouth  daily.   cetirizine (ZYRTEC) 5 MG tablet Take 5 mg by mouth daily as needed.    LIPITOR 40 MG tablet Take 1 tablet (40 mg total) by mouth daily.   meclizine (ANTIVERT) 12.5 MG tablet Take 2 tablets (25 mg total) by mouth 3 (three) times daily as needed for dizziness.   metoprolol tartrate (LOPRESSOR) 25 MG tablet Take 1 tablet (25 mg total) by mouth 2 (two) times daily.   ondansetron (ZOFRAN) 4 MG tablet Take 1 tablet (4 mg total) by mouth daily as needed for nausea or vomiting.   quinapril (ACCUPRIL) 40 MG tablet Take 1 tablet (40 mg total) by mouth daily.   triamcinolone (NASACORT) 55 MCG/ACT nasal inhaler Place 2 sprays into the nose daily as needed.    [DISCONTINUED] estradiol (ESTRACE) 0.1 MG/GM vaginal cream PLACE 1 APPLICATORFUL VAGINALLY 2 TIMES A WEEK (Patient not taking: Reported on 10/26/2021)   [DISCONTINUED] hydrochlorothiazide (HYDRODIURIL) 25 MG tablet Take 0.5 tablets (12.5 mg total) by mouth daily.   [DISCONTINUED] magnesium oxide (MAG-OX) 400 (241.3 Mg) MG tablet TAKE 1 TABLET BY MOUTH ONCE DAILY (Patient not taking: Reported on 10/26/2021)   No facility-administered encounter medications on file as of 11/08/2021.     Lab Results  Component Value Date   WBC 6.8 10/29/2021   HGB 12.4 10/29/2021   HCT 37.8 10/29/2021   PLT 210 10/29/2021   GLUCOSE 90 10/27/2021   CHOL 140 10/27/2021   TRIG 94 10/27/2021   HDL 43 10/27/2021   LDLDIRECT 178.4 11/04/2013   LDLCALC 78 10/27/2021   ALT 26 10/26/2021   AST 36 10/26/2021   NA 136 10/27/2021   K 4.1 10/27/2021   CL 107 10/27/2021   CREATININE 1.08 (H) 10/27/2021   BUN 22 10/27/2021   CO2 23 10/27/2021   TSH 3.19 04/03/2021   HGBA1C 6.4 (H) 10/26/2021   MICROALBUR <0.7 04/05/2021    CT  Head Wo Contrast  Result Date: 10/26/2021 CLINICAL DATA:  Dizziness. EXAM: CT HEAD WITHOUT CONTRAST TECHNIQUE: Contiguous axial images were obtained from the base of the skull through the vertex without intravenous contrast. COMPARISON:  None. FINDINGS: Brain: There is mild cerebral atrophy with widening of the extra-axial spaces and ventricular dilatation. There are areas of decreased attenuation within the white matter tracts of the supratentorial brain, consistent with microvascular disease changes. Vascular: No hyperdense vessel or unexpected calcification. Skull: Normal. Negative for fracture or focal lesion. Sinuses/Orbits: No acute finding. Other: None. IMPRESSION: 1. Generalized cerebral atrophy. 2. No acute intracranial abnormality. Electronically Signed   By: Virgina Norfolk M.D.   On: 10/26/2021 00:09   MR BRAIN WO CONTRAST  Result Date: 10/26/2021 CLINICAL DATA:  87 year old female with vertigo, nausea and vomiting for 2 days. EXAM: MRI HEAD WITHOUT CONTRAST TECHNIQUE: Multiplanar, multiecho pulse sequences of the brain and surrounding structures were obtained without intravenous contrast. COMPARISON:  Head CT without contrast 2358 hours last night. Brain MRI 05/01/2021. FINDINGS: Brain: No restricted diffusion to suggest acute infarction. No midline shift, mass effect, evidence of mass lesion, ventriculomegaly, extra-axial collection or acute intracranial hemorrhage. Cervicomedullary junction and pituitary are within normal limits. Scattered and patchy but overall mild for age bilateral cerebral white matter T2 and FLAIR hyperintensity is stable since May. No cortical encephalomalacia identified. No definite chronic cerebral blood products. Deep gray matter nuclei, brainstem and cerebellum remain normal for age. Vascular: Major intracranial vascular flow voids are stable. Skull and upper cervical spine: Negative. Sinuses/Orbits: Stable, negative. Other: Mastoids remain clear. Stable and  grossly  normal visible internal auditory structures. Stylomastoid foramina and parotid glands appear normal. IMPRESSION: 1. No acute intracranial abnormality. 2. Stable since May and largely normal for age noncontrast MRI appearance of the brain. Electronically Signed   By: Genevie Ann M.D.   On: 10/26/2021 05:17       Assessment & Plan:   Problem List Items Addressed This Visit     Aortic atherosclerosis ([place])    Continue lipitor and aspirin.        Dizziness - Primary    Recently admitted with vertigo as outlined.  W/up as outlined. CT and MRI unrevealing.  No stroke, mass, etc.  PT evaluated and recommended vestibular rehab as outpt.  Also recommended ENT f/u.  Discussed with her today regarding ENT evaluation and treatment.  She declines.  Did agree to vestibular rehab.  Contacted PT.  They are able to see pt Friday 11/10/21 for evaluation.  Daughter notified.  Has f/u with cardiology Friday as well.  Discussed slow position changes and movements.  Follow.  Call with update.       Relevant Orders   Ambulatory referral to Physical Therapy   Elevated troponin    Elevated troponin: Troponin level was mildly elevated in hospital 7, 20, 36, no chest pain.  Per review was felt to possibly be due to demand ischemia recommended Aspirin, Lipitor, metoprolol to be continued on discharge.  Currently denies any chest pain or sob.  Has f/u appt with cardiology 11/10/21.        Headache    No severe headache.  Some drainage.  Discussed steroid nasal spray.  Follow.       Hypercholesterolemia    On lipitor.  Low cholesterol diet and exercise.  Follow lipid panel and liver function tests.       Hypertension    Blood pressure has been doing well on accupril and metoprolol. Follow pressures.  Follow metabolic panel.  Not orthostatic on exam.       Leukocytosis    Noted in hospital.  W/up as outlined.  White count prior to discharge normal (6.8).        Nausea vomiting and diarrhea    All resolved.   Eating.        SVT (supraventricular tachycardia) ([place])    Followed by cardiology.  Continue lopressor.  Overall stable.  Follow.       Type 2 diabetes mellitus with hyperglycemia (HCC)    Low carb diet and exercise given elevated blood glucose. Follow met b and a1c.       Vertigo    Recently admitted with vertigo as outlined.  W/up as outlined. CT and MRI unrevealing.  No stroke, mass, etc.  PT evaluated and recommended vestibular rehab as outpt.  Also recommended ENT f/u.  Discussed with her today regarding ENT evaluation and treatment.  She declines.  Did agree to vestibular rehab.  Contacted PT.  They are able to see pt Friday 11/10/21 for evaluation.  Daughter notified.  Has f/u with cardiology Friday as well.  Discussed slow position changes and movements.  Follow.  Call with update.         Einar Pheasant, MD

## 2021-11-09 NOTE — Telephone Encounter (Signed)
LMTCB

## 2021-11-10 ENCOUNTER — Ambulatory Visit: Payer: Medicare Other

## 2021-11-10 ENCOUNTER — Other Ambulatory Visit: Payer: Self-pay

## 2021-11-10 ENCOUNTER — Encounter: Payer: Self-pay | Admitting: Medical

## 2021-11-10 ENCOUNTER — Ambulatory Visit (INDEPENDENT_AMBULATORY_CARE_PROVIDER_SITE_OTHER): Payer: Medicare Other | Admitting: Medical

## 2021-11-10 VITALS — BP 122/62 | HR 70 | Ht 61.0 in | Wt 140.0 lb

## 2021-11-10 DIAGNOSIS — Z79899 Other long term (current) drug therapy: Secondary | ICD-10-CM

## 2021-11-10 DIAGNOSIS — I10 Essential (primary) hypertension: Secondary | ICD-10-CM

## 2021-11-10 DIAGNOSIS — I5032 Chronic diastolic (congestive) heart failure: Secondary | ICD-10-CM

## 2021-11-10 DIAGNOSIS — E782 Mixed hyperlipidemia: Secondary | ICD-10-CM

## 2021-11-10 DIAGNOSIS — I872 Venous insufficiency (chronic) (peripheral): Secondary | ICD-10-CM

## 2021-11-10 DIAGNOSIS — R778 Other specified abnormalities of plasma proteins: Secondary | ICD-10-CM | POA: Diagnosis not present

## 2021-11-10 MED ORDER — HYDROCHLOROTHIAZIDE 25 MG PO TABS: 12.5000 mg | ORAL_TABLET | Freq: Every day | ORAL | 3 refills | Status: DC

## 2021-11-10 NOTE — Progress Notes (Signed)
Cardiology Office Note:    Date:  11/10/2021   ID:  Elizabeth [name], DOB 04/12/1927, MRN [id number]  PCP:  Dale DurhamScott, Charlene, MD  [facility] Cardiologist:  Julien Nordmannimothy Gollan, MD  [facility] Electrophysiologist:  None   Referring MD: Dale DurhamScott, Charlene, MD   Chief Complaint: Hospital follow-up  History of Present Illness:    Elizabeth HaymakerColleen C [name] is a 87 y.o. female with a hx of SVT, HTN, HLD, hyponatremia, vertigo, IBS who presents for hospital follow-up.   Heart monitor 11/2019 NSR, avg HR 64bpm, 61 SVT/atrial tachycardia longest 12.6 secs. Echo 12/2020 LVEF 55-60%, no WMA, G1DD, mild MR, mild AI.  Admitted 11/3-11/6 for vertigo. WBC elevated, BNP 521, LA 3.5. HS trop peaked to 36. CT head negative. MRI negative. Admitted and vertigo self resolved.   Today,the patient has long-term vertigo, has been dealing with it for years. Occurs occasionally. Since discharge, she still has a little vertigo. Feels she has some vision issues. PCP will do further work-up for vertigo. May see neurology in the future. The patient denies chest pain or shortness of breath. Before the hospitalization patient ws very active. She lives alone and takes care of her self. No anginal symptoms with activity. Has occasional fluid in the ankles for the past year. Also has gained some weight. She was not on HCTZ PTA and was not taking it daily. Recommended taking HCTZ.   Past Medical History:  Diagnosis Date   Cystocele or rectocele with incomplete uterine prolapse    Hyperlipidemia    Hypertension    Irritable bowel syndrome    Palpitations    Vaginal atrophy     Past Surgical History:  Procedure Laterality Date   CATARACT EXTRACTION  2006    Current Medications: Current Meds  Medication Sig   aspirin 81 MG tablet Take 81 mg by mouth daily.   cetirizine (ZYRTEC) 5 MG tablet Take 5 mg by mouth daily as needed.    LIPITOR 40 MG tablet Take 1 tablet (40 mg total) by mouth daily.   meclizine (ANTIVERT) 12.5 MG  tablet Take 2 tablets (25 mg total) by mouth 3 (three) times daily as needed for dizziness.   metoprolol tartrate (LOPRESSOR) 25 MG tablet Take 1 tablet (25 mg total) by mouth 2 (two) times daily.   ondansetron (ZOFRAN) 4 MG tablet Take 1 tablet (4 mg total) by mouth daily as needed for nausea or vomiting.   quinapril (ACCUPRIL) 40 MG tablet Take 1 tablet (40 mg total) by mouth daily.   triamcinolone (NASACORT) 55 MCG/ACT nasal inhaler Place 2 sprays into the nose daily as needed.    [DISCONTINUED] hydrochlorothiazide (HYDRODIURIL) 25 MG tablet Take 0.5 tablets (12.5 mg total) by mouth daily.     Allergies:   Patient has no known allergies.   Social History   Socioeconomic History   Marital status: Widowed    Spouse name: Not on file   Number of children: 2   Years of education: Not on file   Highest education level: Not on file  Occupational History   Not on file  Tobacco Use   Smoking status: Never   Smokeless tobacco: Never  Vaping Use   Vaping Use: Never used  Substance and Sexual Activity   Alcohol use: No    Alcohol/week: 0.0 standard drinks   Drug use: No   Sexual activity: Not Currently    Birth control/protection: None  Other Topics Concern   Not on file  Social History Narrative  Not on file   Social Determinants of Health   Financial Resource Strain: Low Risk    Difficulty of Paying Living Expenses: Not hard at all  Food Insecurity: No Food Insecurity   Worried About Programme researcher, broadcasting/film/video in the Last Year: Never true   Ran Out of Food in the Last Year: Never true  Transportation Needs: No Transportation Needs   Lack of Transportation (Medical): No   Lack of Transportation (Non-Medical): No  Physical Activity: Insufficiently Active   Days of Exercise per Week: 7 days   Minutes of Exercise per Session: 20 min  Stress: No Stress Concern Present   Feeling of Stress : Not at all  Social Connections: Unknown   Frequency of Communication with Friends and Family:  More than three times a week   Frequency of Social Gatherings with Friends and Family: Not on file   Attends Religious Services: Not on file   Active Member of Clubs or Organizations: Not on file   Attends Banker Meetings: Not on file   Marital Status: Widowed     Family History: The patient's family history includes Dementia in her mother and sister; Heart disease in her brother and sister; Leukemia in her brother; Lung cancer in her brother; Stroke in her father; Throat cancer in her brother; Transient ischemic attack in her mother.  ROS:   Please see the history of present illness.     All other systems reviewed and are negative.  EKGs/Labs/Other Studies Reviewed:    The following studies were reviewed today:  Echo 01/19/21   1. Left ventricular ejection fraction, by estimation, is 55 to 60%. The  left ventricle has normal function. The left ventricle has no regional  wall motion abnormalities. Left ventricular diastolic parameters are  consistent with Grade I diastolic  dysfunction (impaired relaxation).   2. Right ventricular systolic function is normal. The right ventricular  size is normal. There is normal pulmonary artery systolic pressure.   3. The mitral valve is normal in structure. Mild mitral valve  regurgitation.   4. The aortic valve is tricuspid. Aortic valve regurgitation is mild.  Mild aortic valve sclerosis is present, with no evidence of aortic valve  stenosis.   Long term heart monitor 10/2019 avg HR of 64 bpm. Sinus Rhythm.    61 Supraventricular Tachycardia/atrial tachycardia runs occurred, the run with the fastest interval lasting 7 beats with a max rate of 218 bpm, the longest lasting 12.6 secs with an avg rate of 148 bpm.    Isolated SVEs were rare (<1.0%), SVE Couplets were rare (<1.0%), and SVE Triplets were rare (<1.0%). Isolated VEs were rare (<1.0%), and no VE Couplets or VE Triplets were present.   Patient triggered events were  associated with normal sinus rhythm, rarely with atrial tachycardia episode.   Signed, Dossie Arbour, MD, Ph.D [facility]    EKG:  EKG is  ordered today.  The ekg ordered today demonstrates NSR, 70bpm, TWI aVL  Recent Labs: 04/03/2021: TSH 3.19 10/26/2021: ALT 26; B Natriuretic Peptide 521.6 10/27/2021: BUN 22; Creatinine, Ser 1.08; Potassium 4.1; Sodium 136 10/29/2021: Hemoglobin 12.4; Platelets 210  Recent Lipid Panel    Component Value Date/Time   CHOL 140 10/27/2021 0725   TRIG 94 10/27/2021 0725   HDL 43 10/27/2021 0725   CHOLHDL 3.3 10/27/2021 0725   VLDL 19 10/27/2021 0725   LDLCALC 78 10/27/2021 0725   LDLDIRECT 178.4 11/04/2013 0821     Physical Exam:  VS:  BP 122/62   Pulse 70   Ht 5\' 1"  (1.549 m)   Wt 140 lb (63.5 kg)   SpO2 99%   BMI 26.45 kg/m     Wt Readings from Last 3 Encounters:  11/10/21 140 lb (63.5 kg)  11/08/21 140 lb (63.5 kg)  10/25/21 140 lb (63.5 kg)     GEN:  Well nourished, well developed in no acute distress HEENT: Normal NECK: No JVD; No carotid bruits LYMPHATICS: No lymphadenopathy CARDIAC: RRR, no murmurs, rubs, gallops RESPIRATORY:  Clear to auscultation without rales, wheezing or rhonchi  ABDOMEN: Soft, non-tender, non-distended MUSCULOSKELETAL:  trace edema; No deformity  SKIN: Warm and dry NEUROLOGIC:  Alert and oriented x 3 PSYCHIATRIC:  Normal affect   ASSESSMENT:    1. Elevated troponin   2. Essential hypertension   3. Mixed hyperlipidemia   4. Medication management   5. Venous insufficiency   6. Chronic diastolic heart failure (HCC)    PLAN:    In order of problems listed above:  Elevated troponin Troponin elevated to 36 in the hospital. Patient has no prior h/op MI or stent, does have RF. Denies anginal symptoms. Echo 12/2020 showed normal LVEF and no WMA. After discussion, we decided to wait on further ischemic testing given age and lack of symptoms. Continue ASA, statin, and BB. She will let us know if  symptoms change.   HFpEF BNP elevated to 520 in the hospital. Echo from 12/2020 showed LVEF 55-60%, normal right heart function, G1DD, mild MR, mild AI. She reported she has intermittent lower leg swelling. She used to be on HCTZ 12.5mg  daily, but this was stopped for possible hypotension. I recommend she restart HCTZ 12.5mg  daily. Also discussed CHF education.   HTN BP good today. Restart HCTZ as above. Continue Lopressor and Quinapril  HLD LDL 78 10/2021. Continue Lipitor 40mg  daily.   Vertigo Still suffering from vertigo with dizziness and vision changes since being discharged. Following with PCP closely, and may see neurology in the near future.   Disposition: Follow up in 3 month(s) with MD/APP     Signed, Raheim Beutler Ninfa Meeker, PA-C  11/10/2021 3:13 PM    [facility]

## 2021-11-10 NOTE — Patient Instructions (Addendum)
Medication Instructions:  - Your physician has recommended you make the following change in your medication:   1) RESTART Hydrochlorothiazide (HCTZ) 25 mg: - take 0.5 tablet (12.5 mg) by mouth once daily   *If you need a refill on your cardiac medications before your next appointment, please call your pharmacy*   Lab Work: - Your physician recommends that you return for lab work in: 1 week- BMP  If you have labs (blood work) drawn today and your tests are completely normal, you will receive your results only by: MyChart Message (if you have MyChart) OR A paper copy in the mail If you have any lab test that is abnormal or we need to change your treatment, we will call you to review the results.   Testing/Procedures: - none ordered   Follow-Up: At [facility], you and your health needs are our priority.  As part of our continuing mission to provide you with exceptional heart care, we have created designated Provider Care Teams.  These Care Teams include your primary Cardiologist (physician) and Advanced Practice Providers (APPs -  Physician Assistants and Nurse Practitioners) who all work together to provide you with the care you need, when you need it.  We recommend signing up for the patient portal called "MyChart".  Sign up information is provided on this After Visit Summary.  MyChart is used to connect with patients for Virtual Visits (Telemedicine).  Patients are able to view lab/test results, encounter notes, upcoming appointments, etc.  Non-urgent messages can be sent to your provider as well.   To learn more about what you can do with MyChart, go to [email or web address].    Your next appointment:   3 month(s)  The format for your next appointment:   In Person  Provider:   You may see Julien Nordmann, MD or one of the following Advanced Practice Providers on your designated Care Team:   Nicolasa Ducking, NP Eula Listen, PA-C Cadence Fransico Michael, [place]    Other  Instructions N/a

## 2021-11-13 ENCOUNTER — Encounter: Payer: Self-pay | Admitting: Internal Medicine

## 2021-11-13 ENCOUNTER — Ambulatory Visit: Payer: Medicare Other

## 2021-11-13 NOTE — Assessment & Plan Note (Signed)
All resolved.  Eating.

## 2021-11-13 NOTE — Assessment & Plan Note (Signed)
Noted in hospital.  W/up as outlined.  White count prior to discharge normal (6.8).

## 2021-11-13 NOTE — Assessment & Plan Note (Signed)
No severe headache.  Some drainage.  Discussed steroid nasal spray.  Follow.

## 2021-11-13 NOTE — Assessment & Plan Note (Signed)
Low carb diet and exercise given elevated blood glucose. Follow met b and a1c.  

## 2021-11-13 NOTE — Assessment & Plan Note (Signed)
Blood pressure has been doing well on accupril and metoprolol. Follow pressures.  Follow metabolic panel.  Not orthostatic on exam.

## 2021-11-13 NOTE — Assessment & Plan Note (Signed)
Recently admitted with vertigo as outlined.  W/up as outlined. CT and MRI unrevealing.  No stroke, mass, etc.  PT evaluated and recommended vestibular rehab as outpt.  Also recommended ENT f/u.  Discussed with her today regarding ENT evaluation and treatment.  She declines.  Did agree to vestibular rehab.  Contacted PT.  They are able to see pt Friday 11/10/21 for evaluation.  Daughter notified.  Has f/u with cardiology Friday as well.  Discussed slow position changes and movements.  Follow.  Call with update.

## 2021-11-13 NOTE — Assessment & Plan Note (Signed)
Continue lipitor and aspirin.  ?

## 2021-11-13 NOTE — Assessment & Plan Note (Signed)
On lipitor.  Low cholesterol diet and exercise.  Follow lipid panel and liver function tests.   

## 2021-11-13 NOTE — Assessment & Plan Note (Signed)
Followed by cardiology.  Continue lopressor.  Overall stable.  Follow.  

## 2021-11-13 NOTE — Assessment & Plan Note (Signed)
Elevated troponin:Troponin level was mildly elevated in hospital 7, 20, 36, no chest pain. Per review was felt to possibly be due to demand ischemia recommended Aspirin, Lipitor, metoprolol to be continued on discharge.  Currently denies any chest pain or sob.  Has f/u appt with cardiology 11/10/21.

## 2021-11-14 ENCOUNTER — Ambulatory Visit: Payer: Medicare Other | Attending: Internal Medicine

## 2021-11-14 ENCOUNTER — Other Ambulatory Visit: Payer: Self-pay

## 2021-11-14 DIAGNOSIS — R42 Dizziness and giddiness: Secondary | ICD-10-CM | POA: Diagnosis not present

## 2021-11-14 DIAGNOSIS — R2681 Unsteadiness on feet: Secondary | ICD-10-CM | POA: Insufficient documentation

## 2021-11-14 NOTE — Therapy (Signed)
[facility] [address] [place], [place], [address] Phone: [phone]   Fax:  [phone]  Physical Therapy Evaluation  Patient Details  Name: Elizabeth [name] Fee MRN: [id number] Date of Birth: [birth year] Referring Provider (PT): Einar Pheasant   Encounter Date: 11/14/2021   PT End of Session - 11/15/21 2139     Visit Number 1    Number of Visits 17    Date for PT Re-Evaluation 01/09/22    Authorization Type eval: 11/14/21, VL: based on [place]  Deductible: met  co-ins:20%  OOP:$[id number]/$[id number] remains  no auth req    PT Start Time 1615    PT Stop Time 1715    PT Time Calculation (min) 60 min    Activity Tolerance Patient tolerated treatment well    Behavior During Therapy [facility] for tasks assessed/performed             Past Medical History:  Diagnosis Date   Cystocele or rectocele with incomplete uterine prolapse    Hyperlipidemia    Hypertension    Irritable bowel syndrome    Palpitations    Vaginal atrophy     Past Surgical History:  Procedure Laterality Date   CATARACT EXTRACTION  2006    There were no vitals filed for this visit.    Subjective Assessment - 11/14/21 1549     Subjective Dizziness    Pertinent History Elizabeth [name] is a 87 y.o. female with medical history significant of hypertension, hyperlipidemia, diet-controlled diabetes, GERD, vertigo, SVT, IBS, cystocele, CKD-3, who was recently admitted from 10/26/21-10/29/21 with vertigo.  Patient was also reporting history of diarrhea and having dry heaves.  On presentation lab work showed WBC count of 15.2, BNP of 521, lactic acid of 3.5.  CT head negative for intracranial normalities. MRI was negative for stroke.  Chest x-ray was negative for pneumonia.  Patient was admitted for further management. PT/OT assessed her and recommended outpatient physical therapy follow-up for vestibular therapy. Symptoms never resolved however have improved since discharge. At this time  she mostly reports feeling unsteady. "It feels like my head is heavy." She has a history of dizziness/vertigo for multiple years. Prior history of ENT evaluation years ago but pt doesn't recall any specific diagnosis however she does state that she has been diagnosed with labyrinthitis and Meniere's Disease in the past. History of bilateral hearing loss, worse on the right side. She had an appointment with Dr. Manuella Ghazi due to some auditory hallucinations but this was attributed to her hearing loss. Per pt and son she reports that all of her cognition is normal.  Lives alone and occasionally reports feeling lonely however denies being depressed. History of migraines marked by floaters but denies headache pain.    Limitations Walking    Diagnostic tests see history    Patient Stated Goals Decrease dizziness, improve balance    Currently in Pain? Other (Comment)   Unrelated to current episode               [facility] PT Assessment - 11/14/21 1550       Assessment   Medical Diagnosis Dizziness    Referring Provider (PT) Einar Pheasant    Onset Date/Surgical Date 10/26/21    Hand Dominance Right    Next MD Visit Not reported    Prior Therapy Previously treated for balance      Precautions   Precautions Fall      Restrictions   Weight Bearing Restrictions No  Balance Screen   Has the patient fallen in the past 6 months No      [place] residence    Living Arrangements Alone    Available Help at Discharge Family                VESTIBULAR AND BALANCE EVALUATION   HISTORY:  Elizabeth [name] is a 87 y.o. female with medical history significant of hypertension, hyperlipidemia, diet-controlled diabetes, GERD, vertigo, SVT, IBS, cystocele, CKD-3, who was recently admitted from 10/26/21-10/29/21 with vertigo.  Patient was also reporting history of diarrhea and having dry heaves.  On presentation lab work showed WBC count of 15.2, BNP of 521, lactic  acid of 3.5.  CT head negative for intracranial normalities. MRI was negative for stroke.  Chest x-ray was negative for pneumonia.  Patient was admitted for further management. PT/OT assessed her and recommended outpatient physical therapy follow-up for vestibular therapy. Symptoms never resolved however have improved since discharge. At this time she mostly reports feeling unsteady. "It feels like my head is heavy." She has a history of dizziness/vertigo for multiple years. Prior history of ENT evaluation years ago but pt doesn't recall any specific diagnosis however she does state that she has been diagnosed with labyrinthitis and Meniere's Disease in the past. History of bilateral hearing loss, worse on the right side. She had an appointment with Dr. Manuella Ghazi due to some auditory hallucinations but this was attributed to her hearing loss. Per pt and son she reports that all of her cognition is normal.  Lives alone and occasionally reports feeling lonely however denies being depressed. History of migraines marked by floaters but denies headache pain.   Description of dizziness: vertigo initially but unsteadiness now (vertigo, unsteadiness, lightheadedness, falling, general unsteadiness, whoozy, swimmy-headed sensation, aural fullness) Frequency: Daily  Duration: Hours Symptom nature: motion-provoked  (motion provoked, positional, spontaneous, constant, variable, intermittent)   Provocative Factors: bending forward, walking Easing Factors: resting, sitting  Progression of symptoms: (better, worse, no change since onset) better History of similar episodes: Prior history of similar episodes  Falls (yes/no): No Number of falls in past 6 months: N/A   Prior Functional Level: Independent ambulation without assistive device. Using rollator since recent onset of vertigo.    Auditory complaints (tinnitus, pain, drainage, hearing loss, aural fullness): constant "noise in both ears," chronic hearing loss (worse  in R ear), unable to recall if tinnitus or hearing loss worsened during recent episode or note Vision (diplopia, visual field loss, recent changes, last eye exam): Subjective decrease in vision in R eye over the last year. Bilateral cataract surgery, pt wears driving and reading glasses, denies any vision changes with most recent episode of vertigo Chest pain: History of arrhythmia, elevated troponin in the hospital Anxiety/stress: Denies Headaches/migraines: History of migraines with visual disturbance but no headache pain, not currently being treated for migraines  Red Flags: (dysarthria, dysphagia, drop attacks, bowel and bladder changes, recent weight loss/gain) Review of systems negative for red flags.     EXAMINATION  POSTURE: Forward head and rounded shoulders  NEUROLOGICAL SCREEN: (2+ unless otherwise noted.) N=normal  Ab=abnormal  Level Dermatome R L Myotome R L Reflex R L  C3 Anterior Neck N N Sidebend C2-3 N N Jaw CN V    C4 Top of Shoulder N N Shoulder Shrug C4 N N Hoffman's UMN    C5 Lateral Upper Arm N N Shoulder ABD C4-5 N N Biceps C5-6    C6 Lateral  Arm/ Thumb N N Arm Flex/ Wrist Ext C5-6 N N Brachiorad. C5-6    C7 Middle Finger N N Arm Ext//Wrist Flex C6-7 N N Triceps C7    C8 4th & 5th Finger N N Flex/ Ext Carpi Ulnaris C8 N N Patellar (L3-4)    T1 Medial Arm N N Interossei T1 N N Gastrocnemius    L2 Medial thigh/groin N N Illiopsoas (L2-3) N N     L3 Lower thigh/med.knee N N Quadriceps (L3-4) N N     L4 Medial leg/lat thigh N N Tibialis Ant (L4-5) N N     L5 Lat. leg & dorsal foot N N EHL (L5) N N     S1 post/lat foot/thigh/leg N N Gastrocnemius (S1-2) N N     S2 Post./med. thigh & leg N N Hamstrings (L4-S3) N N       Cranial Nerves Visual acuity and visual fields are intact  Extraocular muscles are intact  Facial sensation is intact bilaterally  Facial strength is intact bilaterally  Hearing is normal as tested by gross conversation Palate elevates midline,  normal phonation  Shoulder shrug strength is intact  Tongue protrudes midline   COORDINATION: Finger to Nose: Normal Heel to Shin: Normal Pronator Drift: Negative Rapid Alternating Movements: Normal Finger to Thumb Opposition: Normal  MUSCULOSKELETAL SCREEN: Cervical Spine ROM: Painless in all planes but mild to moderate range of motion deficits   ROM: BUE/BLE grossly WNL     MMT: No focal weakness noted  Functional Mobility: Independent for transfers. She arrives ambulating with rollator and is clearly unsteady in standing and with ambulation  POSTURAL CONTROL TESTS:   Clinical Test of Sensory Interaction for Balance    (CTSIB): Deferred   OCULOMOTOR / VESTIBULAR TESTING:  Oculomotor Exam- Room Light  Findings Comments  Ocular Alignment normal   Ocular ROM abnormal Decreased vertical range of motion  Spontaneous Nystagmus abnormal Slow L beating horizontal nystagmus  Gaze-Holding Nystagmus normal   End-Gaze Nystagmus normal   Vergence (normal 2-3") not examined   Smooth Pursuit normal   Cross-Cover Test normal   Saccades normal   VOR Cancellation normal   Left Head Impulse Difficult to assess  due to startle/blink   Right Head Impulse Difficult to assess due to startle/blink   Static Acuity not examined   Dynamic Acuity not examined     Oculomotor Exam- Fixation Suppressed: Deferred  Findings Comments  Ocular Alignment normal   Spontaneous Nystagmus abnormal Slow L beating horizontal nystagmus  Gaze-Holding Nystagmus abnormal Slow L beating horizontal nystagmus with L gaze, absent during R gaze (2nd degree nystagmus)  End-Gaze Nystagmus abnormal See above  Head Shaking Nystagmus abnormal See above, no worsening of nystagmus after head shaking  Pressure-Induced Nystagmus not examined   Hyperventilation Induced Nystagmus not examined   Skull Vibration Induced Nystagmus not examined     BPPV TESTS:  Symptoms Duration Intensity Nystagmus  L Dix-Hallpike None    None  R Dix-Hallpike None   None  L Head Roll NT     R Head Roll NT     L Sidelying Test      R Sidelying Test                  Objective measurements completed on examination: See above findings.                PT Education - 11/15/21 2139     Education Details Plan of care    Person(s)  Educated Patient;Child(ren)    Methods Explanation    Comprehension Verbalized understanding              PT Short Term Goals - 11/15/21 2203       PT SHORT TERM GOAL #1   Title Pt will be independent with HEP for dizziness, strength, and balance in order to decrease fall risk and improve function at home with less dizziness.    Time 4    Period Weeks    Status New    Target Date 12/12/21               PT Long Term Goals - 11/15/21 2208       PT LONG TERM GOAL #1   Title Pt will improve BERG by at least 3 points in order to demonstrate clinically significant improvement in balance.    Baseline 11/14/21: to be completed    Time 8    Period Weeks    Status New    Target Date 01/09/22      PT LONG TERM GOAL #2   Title Pt will increase FOTO to at least the predicted goal in order to demonstrate significant improvement in function related to vertigo    Time 8    Period Weeks    Status New    Target Date 01/09/22      PT LONG TERM GOAL #3   Title Pt will improve ABC by at least 13% in order to demonstrate clinically significant improvement in balance confidence.    Baseline 11/14/21: to be completed    Time 8    Period Weeks    Status New    Target Date 01/09/22                    Plan - 11/15/21 2142     Clinical Impression Statement Pt is a pleasant 87 year-old female referred for dizziness s/p hospital admission. Pt has an extensive history of chronic vertigo/dizziness and reports previous diagnoses of Meneire's Disease and labrynthitis. She has chronic hearing loss and tinnitus so it is difficult for her to report whether these  symptoms worsened during her most recent episode. Oculomotor/vestibular testing reveals decrease vertical gaze range of motion as well as spontaneous slow L horizontal beating nystagmus with fixation suppression. Dix-Hallpike testing is negative bilaterally for nystagmus or vertigo. Unable to complete additional balance testing but will be performed at follow-up visit. 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 Exact cause unclear but likely related to either Meneire's or neurtitis/labrynthitis. Will initiate gaze stabilization and habituation exercises at follow-up appointment. Pt will benefit from skilled PT servies to address deficits in dizziness/vertigo and unsteadiness in order to improve symptom-free function at home and decrease her risk for falls.    Personal Factors and Comorbidities Age;Comorbidity 3+;Past/Current Experience;Time since onset of injury/illness/exacerbation    Comorbidities DM, aortic atherosclersis, SVT, headache, vertigo    Examination-Activity Limitations Bend;Locomotion Level;Stand;Transfers    Examination-Participation Restrictions Community Activity;Cleaning;Meal Prep    Stability/Clinical Decision Making Unstable/Unpredictable    Clinical Decision Making High    Rehab Potential Fair    PT Frequency 2x / week    PT Duration 8 weeks    PT Treatment/Interventions ADLs/Self Care Home Management;Aquatic Therapy;Biofeedback;Canalith Repostioning;Cryotherapy;Electrical Stimulation;Iontophoresis 11m/ml Dexamethasone;Moist Heat;Traction;Ultrasound;DME Instruction;Gait training;Stair training;Functional mobility training;Therapeutic activities;Therapeutic exercise;Balance training;Neuromuscular re-education;Patient/family education;Manual techniques;Passive range of motion;Dry needling;Taping;Vestibular;Spinal Manipulations;Joint Manipulations;Visual/perceptual remediation/compensation;Cognitive remediation    PT Next Visit Plan DHI, BERG, DGI?, 5TSTS, [id number]  speed, Initiate  gaze stabilization exercises and habituation exercises, issue HEP    PT Home Exercise Plan None currently    Consulted and Agree with Plan of Care Patient;Family member/caregiver    Family Member Consulted Son             Patient will benefit from skilled therapeutic intervention in order to improve the following deficits and impairments:  Decreased balance, Dizziness  Visit Diagnosis: Dizziness and giddiness  Unsteadiness on feet     Problem List Patient Active Problem List   Diagnosis Date Noted   Vertigo 10/26/2021   Elevated troponin 10/26/2021   Leukocytosis 10/26/2021   Elevated lactic acid level 10/26/2021   Nausea vomiting and diarrhea 10/26/2021   Aortic atherosclerosis ([place]) 06/26/2021   Auditory hallucinations 04/10/2021   Right shoulder pain 10/02/2020   CKD (chronic kidney disease), stage IIIa 10/02/2020   Elevated TSH 10/02/2020   Weakness 05/29/2020   Type 2 diabetes mellitus with hyperglycemia ([place]) 01/07/2020   GERD (gastroesophageal reflux disease) 11/09/2018   Dizziness 06/02/2017   Vaginal atrophy 09/27/2016   SVT (supraventricular tachycardia) ([place]) 01/27/2016   Headache 01/26/2016   Left arm numbness 01/26/2016   Urethral caruncle 12/23/2015   Vaginal pessary in situ 09/22/2015   Health care maintenance 07/22/2015   Baden-Walker grade 4 cystocele 06/26/2015   First degree uterine prolapse 06/26/2015   Hyperkalemia 04/27/2015   Arrhythmia 04/27/2015   SOB (shortness of breath) 04/27/2015   Double vision 07/18/2014   Decreased hearing 07/18/2014   Stress 07/18/2014   Fatigue 07/18/2014   Environmental allergies 07/09/2013   Hypertension 12/09/2012   Hypercholesterolemia 12/09/2012   Lyndel Safe Khayri Kargbo PT, DPT, GCS  Shaquilla Kehres, PT 11/15/2021, 10:12 PM  [facility] [address] [place], [place], [address] Phone: [phone]   Fax:  [phone]  Name: Tekeshia Klahr  Grist MRN: [id number] Date of Birth: 1927/02/23

## 2021-11-21 ENCOUNTER — Ambulatory Visit: Payer: Medicare Other

## 2021-11-21 ENCOUNTER — Other Ambulatory Visit: Payer: Self-pay

## 2021-11-21 DIAGNOSIS — R42 Dizziness and giddiness: Secondary | ICD-10-CM | POA: Diagnosis not present

## 2021-11-21 DIAGNOSIS — R2681 Unsteadiness on feet: Secondary | ICD-10-CM

## 2021-11-21 NOTE — Patient Instructions (Signed)
Access Code: [email or web address] URL: [email or web address] Date: 11/21/2021 Prepared by: Ria Comment  Exercises Seated Gaze Stabilization with Head Rotation - 4 x daily - 7 x weekly - 3 reps - 60 seconds hold

## 2021-11-21 NOTE — Therapy (Signed)
[facility] [address]. [place], [place], [address] Phone: [phone]   Fax:  [phone]  Physical Therapy Treatment  Patient Details  Name: Elizabeth [name] MRN: [id number] Date of Birth: 10-17-1927 Referring Provider (PT): Einar Pheasant   Encounter Date: 11/21/2021   PT End of Session - 11/21/21 1614     Visit Number 2    Number of Visits 17    Date for PT Re-Evaluation 01/09/22    Authorization Type eval: 11/14/21, VL: based on [place]  Deductible: met  co-ins:20%  OOP:$3190/$2242.56 remains  no auth req    PT Start Time 1615    PT Stop Time 1700    PT Time Calculation (min) 45 min    Activity Tolerance Patient tolerated treatment well    Behavior During Therapy [facility] for tasks assessed/performed             Past Medical History:  Diagnosis Date   Cystocele or rectocele with incomplete uterine prolapse    Hyperlipidemia    Hypertension    Irritable bowel syndrome    Palpitations    Vaginal atrophy     Past Surgical History:  Procedure Laterality Date   CATARACT EXTRACTION  2006    There were no vitals filed for this visit.   Subjective Assessment - 11/21/21 1614     Subjective Pt reports that she was very nauseous after the initial evaluation and vomited on the way home. She had to take her Zofran and dramamine when she got home. Otherwise no significant changes since the initial evaluation. No specific questions upon arrival.    Pertinent History Santrice Muzio Blatchley is a 87 y.o. female with medical history significant of hypertension, hyperlipidemia, diet-controlled diabetes, GERD, vertigo, SVT, IBS, cystocele, CKD-3, who was recently admitted from 10/26/21-10/29/21 with vertigo.  Patient was also reporting history of diarrhea and having dry heaves.  On presentation lab work showed WBC count of 15.2, BNP of 521, lactic acid of 3.5.  CT head negative for intracranial normalities. MRI was negative for stroke.  Chest x-ray was  negative for pneumonia.  Patient was admitted for further management. PT/OT assessed her and recommended outpatient physical therapy follow-up for vestibular therapy. Symptoms never resolved however have improved since discharge. At this time she mostly reports feeling unsteady. "It feels like my head is heavy." She has a history of dizziness/vertigo for multiple years. Prior history of ENT evaluation years ago but pt doesn't recall any specific diagnosis however she does state that she has been diagnosed with labyrinthitis and Meniere's Disease in the past. History of bilateral hearing loss, worse on the right side. She had an appointment with Dr. Manuella Ghazi due to some auditory hallucinations but this was attributed to her hearing loss. Per pt and son she reports that all of her cognition is normal.  Lives alone and occasionally reports feeling lonely however denies being depressed. History of migraines marked by floaters but denies headache pain.    Limitations Walking    Diagnostic tests see history    Patient Stated Goals Decrease dizziness, improve balance               [facility] PT Assessment - 11/21/21 1627       Standardized Balance Assessment   Standardized Balance Assessment Berg Balance Test;Dynamic Gait Index      Berg Balance Test   Sit to Stand Able to stand without using hands and stabilize independently    Standing Unsupported Able to  stand safely 2 minutes    Sitting with Back Unsupported but Feet Supported on Floor or Stool Able to sit safely and securely 2 minutes    Stand to Sit Sits safely with minimal use of hands    Transfers Able to transfer safely, minor use of hands    Standing Unsupported with Eyes Closed Able to stand 10 seconds with supervision    Standing Unsupported with Feet Together Able to place feet together independently and stand 1 minute safely    From Standing, Reach Forward with Outstretched Arm Can reach confidently >25 cm (10")    From Standing Position,  Pick up Object from Floor Able to pick up shoe safely and easily    From Standing Position, Turn to Look Behind Over each Shoulder Needs assist to keep from losing balance and falling    Turn 360 Degrees Needs assistance while turning    Standing Unsupported, Alternately Place Feet on Step/Stool Able to stand independently and safely and complete 8 steps in 20 seconds    Standing Unsupported, One Foot in Front Able to plae foot ahead of the other independently and hold 30 seconds    Standing on One Leg Tries to lift leg/unable to hold 3 seconds but remains standing independently    Total Score 43      Dynamic Gait Index   Level Surface Mild Impairment    Change in Gait Speed Normal    Gait with Horizontal Head Turns Moderate Impairment    Gait with Vertical Head Turns Moderate Impairment    Gait and Pivot Turn Severe Impairment    Step Over Obstacle Normal    Step Around Obstacles Normal    Steps Mild Impairment    Total Score 15             Neuromuscular Re-education    FUNCTIONAL OUTCOME MEASURES   11/14/21 11/21/21 Comments  BERG NT 43/56 Fall risk, in need of intervention  DGI NT 15/24 Fall risk, in need of intervention  FOTO 50 NT Predicted improvement to 60  TUG NT 10.7 seconds WNL  5TSTS NT 9.5 seconds WNL  10 Meter Gait Speed NT Self-selected: 8s = 1.25 m/s; WNL  ABC Scale NT 57.5% Low balance confidence  DHI NT 40/100 Moderate perception of handicap related to dizziness    POSTURAL CONTROL TESTS:   Clinical Test of Sensory Interaction for Balance    (CTSIB):  CONDITION TIME SWAY  Eyes open, firm surface 30 seconds 1+  Eyes closed, firm surface 17.5 seconds 4+  Eyes open, foam surface 30 seconds 2+  Eyes closed, foam surface 2.8 seconds 4+    VOR x 1 horizontal in sitting with target arms length away on plain wall 3 x 60s (3/10 dizziness), issued to HEP;    Updated additional outcome measures with patient during session today. Her 5TSTS, TUG, and 42m gait speed are all WNL. She demonstrates low balance confidence scoring 57.5% on the ABC scale and moderate perception of handicap related to her dizziness scoring 40/100 on the [place] Balance deficits identified by BERG of 43/56 and DGI of 15/24. Modified CTSIB is abnormal with pt losing balance during both conditions 2 and 4. Initiated gaze stabilization exercises during session today which reproduce pt's feeling of dizziness. Issued for HEP. Pt encouraged to follow-up as scheduled. Pt will benefit from PT services to address deficits in balance and dizziness in order to return to full function at home.  PT Education - 11/21/21 1725     Education Details HEP    Person(s) Educated Patient    Methods Explanation;Demonstration;Handout    Comprehension Verbalized understanding;Returned demonstration              PT Short Term Goals - 11/15/21 2203       PT SHORT TERM GOAL #1   Title Pt will be independent with HEP for dizziness, strength, and balance in order to decrease fall risk and improve function at home with less dizziness.    Time 4    Period Weeks    Status New    Target Date 12/12/21               PT Long Term Goals - 11/21/21 1725       PT LONG TERM GOAL #1   Title Pt will improve BERG by at least 3 points in order to demonstrate clinically significant improvement in balance.    Baseline 11/14/21: to be completed; 11/21/21: 43/56    Time 8    Period Weeks    Status New    Target Date 01/09/22      PT LONG TERM GOAL #2   Title Pt will increase FOTO to at least 60 in order to demonstrate significant improvement in function related to vertigo    Baseline 11/14/21: 50    Time 8    Period Weeks    Status New    Target Date 01/09/22      PT LONG TERM GOAL #3   Title Pt will improve ABC by at least 13% in order to demonstrate clinically significant improvement in balance confidence.    Baseline 11/14/21: to be completed; 11/21/21: 57.5%     Time 8    Period Weeks    Status New    Target Date 01/09/22      PT LONG TERM GOAL #4   Title Pt will improve DGI by at least 3 points in order to demonstrate clinically significant improvement in balance and decreased risk for falls    Baseline 11/21/21: 15/24    Time 8    Period Weeks    Status New    Target Date 01/09/22                   Plan - 11/21/21 1615     Clinical Impression Statement Updated additional outcome measures with patient during session today. Her 5TSTS, TUG, and 43mgait speed are all WNL. She demonstrates low balance confidence scoring 57.5% on the ABC scale and moderate perception of handicap related to her dizziness scoring 40/100 on the [place]West University [place] Balance deficits identified by BERG of 43/56 and DGI of 15/24. Modified CTSIB is abnormal with pt losing balance during both conditions 2 and 4. Initiated gaze stabilization exercises during session today which reproduce pt's feeling of dizziness. Issued for HEP. Pt encouraged to follow-up as scheduled. Pt will benefit from PT services to address deficits in balance and dizziness in order to return to full function at home.    Personal Factors and Comorbidities Age;Comorbidity 3+;Past/Current Experience;Time since onset of injury/illness/exacerbation    Comorbidities DM, aortic atherosclersis, SVT, headache, vertigo    Examination-Activity Limitations Bend;Locomotion Level;Stand;Transfers    Examination-Participation Restrictions Community Activity;Cleaning;Meal Prep    Stability/Clinical Decision Making Unstable/Unpredictable    Rehab Potential Fair    PT Frequency 2x / week    PT Duration 8 weeks    PT Treatment/Interventions ADLs/Self Care Home Management;Aquatic Therapy;Biofeedback;Canalith Repostioning;Cryotherapy;Electrical Stimulation;Iontophoresis  27m/ml Dexamethasone;Moist Heat;Traction;Ultrasound;DME Instruction;Gait training;Stair training;Functional mobility training;Therapeutic activities;Therapeutic  exercise;Balance training;Neuromuscular re-education;Patient/family education;Manual techniques;Passive range of motion;Dry needling;Taping;Vestibular;Spinal Manipulations;Joint Manipulations;Visual/perceptual remediation/compensation;Cognitive remediation    PT Next Visit Plan Review HEP, continue gaze stabilization exercises and habituation exercises    PT Home Exercise Plan Access Code: [email or web address]   Consulted and Agree with Plan of Care Patient;Family member/caregiver    Family Member Consulted Son             Patient will benefit from skilled therapeutic intervention in order to improve the following deficits and impairments:  Decreased balance, Dizziness  Visit Diagnosis: Dizziness and giddiness  Unsteadiness on feet     Problem List Patient Active Problem List   Diagnosis Date Noted   Vertigo 10/26/2021   Elevated troponin 10/26/2021   Leukocytosis 10/26/2021   Elevated lactic acid level 10/26/2021   Nausea vomiting and diarrhea 10/26/2021   Aortic atherosclerosis ([place] 06/26/2021   Auditory hallucinations 04/10/2021   Right shoulder pain 10/02/2020   CKD (chronic kidney disease), stage IIIa 10/02/2020   Elevated TSH 10/02/2020   Weakness 05/29/2020   Type 2 diabetes mellitus with hyperglycemia ([place] 01/07/2020   GERD (gastroesophageal reflux disease) 11/09/2018   Dizziness 06/02/2017   Vaginal atrophy 09/27/2016   SVT (supraventricular tachycardia) ([place] 01/27/2016   Headache 01/26/2016   Left arm numbness 01/26/2016   Urethral caruncle 12/23/2015   Vaginal pessary in situ 09/22/2015   Health care maintenance 07/22/2015   Baden-Walker grade 4 cystocele 06/26/2015   First degree uterine prolapse 06/26/2015   Hyperkalemia 04/27/2015   Arrhythmia 04/27/2015   SOB (shortness of breath) 04/27/2015   Double vision 07/18/2014   Decreased hearing 07/18/2014   Stress 07/18/2014   Fatigue 07/18/2014   Environmental allergies 07/09/2013   Hypertension 12/09/2012    Hypercholesterolemia 12/09/2012   JLyndel SafeHuprich PT, DPT, GCS  Arrionna Serena, PT 11/21/2021, 5:27 PM  [place] [facility] VegasMSouth[facility] [place] [address]Phone: [phone]  Fax:  [phone] Name: CZenovia JustmanWard MRN: [id number] of Birth: 207/03/1927

## 2021-11-22 ENCOUNTER — Other Ambulatory Visit (INDEPENDENT_AMBULATORY_CARE_PROVIDER_SITE_OTHER): Payer: Medicare Other

## 2021-11-22 DIAGNOSIS — I10 Essential (primary) hypertension: Secondary | ICD-10-CM | POA: Diagnosis not present

## 2021-11-22 DIAGNOSIS — Z79899 Other long term (current) drug therapy: Secondary | ICD-10-CM

## 2021-11-23 LAB — BASIC METABOLIC PANEL
BUN/Creatinine Ratio: 23 (ref 12–28)
BUN: 33 mg/dL (ref 10–36)
CO2: 18 mmol/L — ABNORMAL LOW (ref 20–29)
Calcium: 9.4 mg/dL (ref 8.7–10.3)
Chloride: 99 mmol/L (ref 96–106)
Creatinine, Ser: 1.42 mg/dL — ABNORMAL HIGH (ref 0.57–1.00)
Glucose: 103 mg/dL — ABNORMAL HIGH (ref 70–99)
Potassium: 5 mmol/L (ref 3.5–5.2)
Sodium: 134 mmol/L (ref 134–144)
eGFR: 35 mL/min/{1.73_m2} — ABNORMAL LOW (ref >59)

## 2021-11-24 ENCOUNTER — Ambulatory Visit: Payer: Medicare Other

## 2021-11-24 ENCOUNTER — Telehealth: Payer: Self-pay | Admitting: Medical

## 2021-11-24 DIAGNOSIS — R7989 Other specified abnormal findings of blood chemistry: Secondary | ICD-10-CM

## 2021-11-24 NOTE — Telephone Encounter (Signed)
Patient made aware of lab results and Cadence Furth, [place] recommendation with verbalized understanding.. Lab appt for repeat bmp scheduled on 12/01/21 @ 10am.

## 2021-11-24 NOTE — Telephone Encounter (Signed)
Furth, Cadence H, PA-C  P Cv Div [place] Triage Labs showed worsening kidney function on HCTZ. Lets stop HCTZ and re-check BMET in a week. Recommend CHF education with low salt diet, compression socks, leg elevation.

## 2021-11-28 ENCOUNTER — Ambulatory Visit: Payer: Medicare Other

## 2021-11-30 ENCOUNTER — Ambulatory Visit: Payer: Medicare Other

## 2021-12-01 ENCOUNTER — Other Ambulatory Visit (INDEPENDENT_AMBULATORY_CARE_PROVIDER_SITE_OTHER): Payer: Medicare Other

## 2021-12-01 ENCOUNTER — Other Ambulatory Visit: Payer: Self-pay

## 2021-12-01 ENCOUNTER — Ambulatory Visit: Payer: Medicare Other | Attending: Internal Medicine

## 2021-12-01 VITALS — BP 161/68 | HR 73

## 2021-12-01 DIAGNOSIS — R2681 Unsteadiness on feet: Secondary | ICD-10-CM | POA: Diagnosis present

## 2021-12-01 DIAGNOSIS — R42 Dizziness and giddiness: Secondary | ICD-10-CM | POA: Insufficient documentation

## 2021-12-01 DIAGNOSIS — R7989 Other specified abnormal findings of blood chemistry: Secondary | ICD-10-CM | POA: Diagnosis not present

## 2021-12-01 NOTE — Therapy (Signed)
[facility] [address] [place], [place], [address] Phone: [phone]   Fax:  [phone]  Physical Therapy Treatment  Patient Details  Name: Elizabeth [name] MRN: [id number] Date of Birth: [birth year] Referring Provider (PT): Einar Pheasant   Encounter Date: 12/01/2021   PT End of Session - 12/01/21 1150     Visit Number 3    Number of Visits 17    Date for PT Re-Evaluation 01/09/22    Authorization Type eval: 11/14/21, VL: based on [place]  Deductible: met  co-ins:20%  OOP:$3190/$2242.56 remains  no auth req    PT Start Time 1115    PT Stop Time 1155    PT Time Calculation (min) 40 min    Activity Tolerance Patient tolerated treatment well    Behavior During Therapy [facility] for tasks assessed/performed              Past Medical History:  Diagnosis Date   Cystocele or rectocele with incomplete uterine prolapse    Hyperlipidemia    Hypertension    Irritable bowel syndrome    Palpitations    Vaginal atrophy     Past Surgical History:  Procedure Laterality Date   CATARACT EXTRACTION  2006    Vitals:   12/01/21 1116  BP: (!) 161/68  Pulse: 73     Subjective Assessment - 12/01/21 1116     Subjective Pt reports that she is doing well today. She is having some dizziness currently but overall she believes that her dizziness is improving.  She does however report feeling frustrated that her symptoms not fully resolved.    Pertinent History Elizabeth [name] is a 87 y.o. female with medical history significant of hypertension, hyperlipidemia, diet-controlled diabetes, GERD, vertigo, SVT, IBS, cystocele, CKD-3, who was recently admitted from 10/26/21-10/29/21 with vertigo.  Patient was also reporting history of diarrhea and having dry heaves.  On presentation lab work showed WBC count of 15.2, BNP of 521, lactic acid of 3.5.  CT head negative for intracranial normalities. MRI was negative for stroke.  Chest x-ray was negative for  pneumonia.  Patient was admitted for further management. PT/OT assessed her and recommended outpatient physical therapy follow-up for vestibular therapy. Symptoms never resolved however have improved since discharge. At this time she mostly reports feeling unsteady. "It feels like my head is heavy." She has a history of dizziness/vertigo for multiple years. Prior history of ENT evaluation years ago but pt doesn't recall any specific diagnosis however she does state that she has been diagnosed with labyrinthitis and Meniere's Disease in the past. History of bilateral hearing loss, worse on the right side. She had an appointment with Dr. Manuella Ghazi due to some auditory hallucinations but this was attributed to her hearing loss. Per pt and son she reports that all of her cognition is normal.  Lives alone and occasionally reports feeling lonely however denies being depressed. History of migraines marked by floaters but denies headache pain.    Limitations Walking    Diagnostic tests see history    Patient Stated Goals Decrease dizziness, improve balance    Currently in Pain? Other (Comment)   Unrelated to current episode                Neuromuscular Re-education  VOR x 1 horizontal in sitting with target arms length away on plain wall x 60s (no dizziness); VOR x 1 horizontal in standing with target arms length away on plain wall 2 x 60s ("  mild dizziness", pt unable to rate);  All balance exercises performed without UE support unless otherwise specified: Feet together horizontal and vertical head turns 2 x 60s each direction; Tandem static balance alternating forward LE 2 x 30s each; Tandem gait in // bars x 4 lengths; Gait in hallway with vertical ball lifts with head/eye follow 2 x 70'; Gait in hallway with horizontal ball passes between hands with head/eye follow 2 x 70' Quick 180' turns on command during gait with therapist calling a direction x multiple bouts; Airex feet together eyes closed x  60s; HEP updated with patient;   Pt demonstrates excellent motivation during session today. Continued with gaze stabilization exercises during session today which reproduce pt's feeling of dizziness in standing.  She demonstrates severe staggering during head turning activities while walking in the hallway.  Progressed HEP to standing gaze stabilization but additional balance exercise not provided due to concerns over patient's safety performing independently at home. Pt encouraged to follow-up as scheduled. Pt will benefit from PT services to address deficits in balance and dizziness in order to return to full function at home.                 PT Short Term Goals - 11/15/21 2203       PT SHORT TERM GOAL #1   Title Pt will be independent with HEP for dizziness, strength, and balance in order to decrease fall risk and improve function at home with less dizziness.    Time 4    Period Weeks    Status New    Target Date 12/12/21               PT Long Term Goals - 11/21/21 1725       PT LONG TERM GOAL #1   Title Pt will improve BERG by at least 3 points in order to demonstrate clinically significant improvement in balance.    Baseline 11/14/21: to be completed; 11/21/21: 43/56    Time 8    Period Weeks    Status New    Target Date 01/09/22      PT LONG TERM GOAL #2   Title Pt will increase FOTO to at least 60 in order to demonstrate significant improvement in function related to vertigo    Baseline 11/14/21: 50    Time 8    Period Weeks    Status New    Target Date 01/09/22      PT LONG TERM GOAL #3   Title Pt will improve ABC by at least 13% in order to demonstrate clinically significant improvement in balance confidence.    Baseline 11/14/21: to be completed; 11/21/21: 57.5%    Time 8    Period Weeks    Status New    Target Date 01/09/22      PT LONG TERM GOAL #4   Title Pt will improve DGI by at least 3 points in order to demonstrate clinically  significant improvement in balance and decreased risk for falls    Baseline 11/21/21: 15/24    Time 8    Period Weeks    Status New    Target Date 01/09/22                   Plan - 12/01/21 1149     Clinical Impression Statement Pt demonstrates excellent motivation during session today. Continued with gaze stabilization exercises during session today which reproduce pt's feeling of dizziness in standing.  She demonstrates severe  staggering during head turning activities while walking in the hallway.  Progressed HEP to standing gaze stabilization but additional balance exercise not provided due to concerns over patient's safety performing independently at home. Pt encouraged to follow-up as scheduled. Pt will benefit from PT services to address deficits in balance and dizziness in order to return to full function at home.    Personal Factors and Comorbidities Age;Comorbidity 3+;Past/Current Experience;Time since onset of injury/illness/exacerbation    Comorbidities DM, aortic atherosclersis, SVT, headache, vertigo    Examination-Activity Limitations Bend;Locomotion Level;Stand;Transfers    Examination-Participation Restrictions Community Activity;Cleaning;Meal Prep    Stability/Clinical Decision Making Unstable/Unpredictable    Rehab Potential Fair    PT Frequency 2x / week    PT Duration 8 weeks    PT Treatment/Interventions ADLs/Self Care Home Management;Aquatic Therapy;Biofeedback;Canalith Repostioning;Cryotherapy;Electrical Stimulation;Iontophoresis 99m/ml Dexamethasone;Moist Heat;Traction;Ultrasound;DME Instruction;Gait training;Stair training;Functional mobility training;Therapeutic activities;Therapeutic exercise;Balance training;Neuromuscular re-education;Patient/family education;Manual techniques;Passive range of motion;Dry needling;Taping;Vestibular;Spinal Manipulations;Joint Manipulations;Visual/perceptual remediation/compensation;Cognitive remediation    PT Next Visit Plan  Review HEP, continue gaze stabilization exercises and habituation exercises    PT Home Exercise Plan Access Code: [email or web address]   Consulted and Agree with Plan of Care Patient;Family member/caregiver    Family Member Consulted Son              Patient will benefit from skilled therapeutic intervention in order to improve the following deficits and impairments:  Decreased balance, Dizziness  Visit Diagnosis: Dizziness and giddiness  Unsteadiness on feet     Problem List Patient Active Problem List   Diagnosis Date Noted   Vertigo 10/26/2021   Elevated troponin 10/26/2021   Leukocytosis 10/26/2021   Elevated lactic acid level 10/26/2021   Nausea vomiting and diarrhea 10/26/2021   Aortic atherosclerosis ([place] 06/26/2021   Auditory hallucinations 04/10/2021   Right shoulder pain 10/02/2020   CKD (chronic kidney disease), stage IIIa 10/02/2020   Elevated TSH 10/02/2020   Weakness 05/29/2020   Type 2 diabetes mellitus with hyperglycemia (HS[place] 01/07/2020   GERD (gastroesophageal reflux disease) 11/09/2018   Dizziness 06/02/2017   Vaginal atrophy 09/27/2016   SVT (supraventricular tachycardia) ([place] 01/27/2016   Headache 01/26/2016   Left arm numbness 01/26/2016   Urethral caruncle 12/23/2015   Vaginal pessary in situ 09/22/2015   Health care maintenance 07/22/2015   Baden-Walker grade 4 cystocele 06/26/2015   First degree uterine prolapse 06/26/2015   Hyperkalemia 04/27/2015   Arrhythmia 04/27/2015   SOB (shortness of breath) 04/27/2015   Double vision 07/18/2014   Decreased hearing 07/18/2014   Stress 07/18/2014   Fatigue 07/18/2014   Environmental allergies 07/09/2013   Hypertension 12/09/2012   Hypercholesterolemia 12/09/2012   JLyndel SafeHuprich PT, DPT, GCS  Naomie Crow, PT 12/01/2021, 12:34 PM  [place] [facility] EnglandMNort[facility] [place] [address]Phone: [phone]  Fax:  [phone] Name: CKhamila Bassinger [name] MRN: [id number] of Birth: [birth year]

## 2021-12-01 NOTE — Patient Instructions (Signed)
Access Code: [email or web address] URL: [email or web address] Date: 12/01/2021 Prepared by: Ria Comment  Exercises Standing Gaze Stabilization with Head Rotation - 4 x daily - 7 x weekly - 3 reps - 60 seconds hold

## 2021-12-02 LAB — BASIC METABOLIC PANEL
BUN/Creatinine Ratio: 25 (ref 12–28)
BUN: 29 mg/dL (ref 10–36)
CO2: 14 mmol/L — ABNORMAL LOW (ref 20–29)
Calcium: 9.5 mg/dL (ref 8.7–10.3)
Chloride: 108 mmol/L — ABNORMAL HIGH (ref 96–106)
Creatinine, Ser: 1.16 mg/dL — ABNORMAL HIGH (ref 0.57–1.00)
Glucose: 122 mg/dL — ABNORMAL HIGH (ref 70–99)
Potassium: 5 mmol/L (ref 3.5–5.2)
Sodium: 141 mmol/L (ref 134–144)
eGFR: 44 mL/min/{1.73_m2} — ABNORMAL LOW (ref >59)

## 2021-12-05 ENCOUNTER — Ambulatory Visit: Payer: Medicare Other

## 2021-12-05 ENCOUNTER — Other Ambulatory Visit: Payer: Self-pay

## 2021-12-05 VITALS — BP 172/59 | HR 69

## 2021-12-05 DIAGNOSIS — R2681 Unsteadiness on feet: Secondary | ICD-10-CM

## 2021-12-05 DIAGNOSIS — R42 Dizziness and giddiness: Secondary | ICD-10-CM

## 2021-12-05 NOTE — Therapy (Signed)
[facility] [address] [place], [place], [address] Phone: [phone]   Fax:  [phone]  Physical Therapy Treatment  Patient Details  Name: Elizabeth [name] MRN: [id number] Date of Birth: 03/01/1927 Referring Provider (PT): Einar Pheasant   Encounter Date: 12/05/2021   PT End of Session - 12/05/21 1609     Visit Number 4    Number of Visits 17    Date for PT Re-Evaluation 01/09/22    Authorization Type eval: 11/14/21, VL: based on [place]  Deductible: met  co-ins:20%  OOP:$[id number]/$[id number] remains  no auth req    PT Start Time 1615    PT Stop Time 1700    PT Time Calculation (min) 45 min    Activity Tolerance Patient tolerated treatment well    Behavior During Therapy [facility] for tasks assessed/performed              Past Medical History:  Diagnosis Date   Cystocele or rectocele with incomplete uterine prolapse    Hyperlipidemia    Hypertension    Irritable bowel syndrome    Palpitations    Vaginal atrophy     Past Surgical History:  Procedure Laterality Date   CATARACT EXTRACTION  2006    Vitals:   12/05/21 1622  BP: (!) 172/59  Pulse: 69  SpO2: 100%      Subjective Assessment - 12/05/21 1608     Subjective Pt reports that she is doing alright but has been having a lot of dizziness today. She expresses frustration over continued symptoms.    Pertinent History Elizabeth [name] is a 87 y.o. female with medical history significant of hypertension, hyperlipidemia, diet-controlled diabetes, GERD, vertigo, SVT, IBS, cystocele, CKD-3, who was recently admitted from 10/26/21-10/29/21 with vertigo.  Patient was also reporting history of diarrhea and having dry heaves.  On presentation lab work showed WBC count of 15.2, BNP of 521, lactic acid of 3.5.  CT head negative for intracranial normalities. MRI was negative for stroke.  Chest x-ray was negative for pneumonia.  Patient was admitted for further management. PT/OT assessed her  and recommended outpatient physical therapy follow-up for vestibular therapy. Symptoms never resolved however have improved since discharge. At this time she mostly reports feeling unsteady. "It feels like my head is heavy." She has a history of dizziness/vertigo for multiple years. Prior history of ENT evaluation years ago but pt doesn't recall any specific diagnosis however she does state that she has been diagnosed with labyrinthitis and Meniere's Disease in the past. History of bilateral hearing loss, worse on the right side. She had an appointment with Dr. Manuella Ghazi due to some auditory hallucinations but this was attributed to her hearing loss. Per pt and son she reports that all of her cognition is normal.  Lives alone and occasionally reports feeling lonely however denies being depressed. History of migraines marked by floaters but denies headache pain.    Limitations Walking    Diagnostic tests see history    Patient Stated Goals Decrease dizziness, improve balance    Currently in Pain? Other (Comment)   Unrelated to current episode             TREATMENT   Neuromuscular Re-education  VOR x 1 horizontal in standing with target arms length away on plain wall x 60s ("mild dizziness", pt unable to rate); VOR x 1 vertical in standing with target arms length away on plain wall 2 x 60s (pt reports more dizziness during vertical);  All balance exercises performed without UE support unless otherwise specified: Half tandem balance alternating forward LE with horizontal and vertical head turns x 60s each direction with each forward; Tandem gait in // bars x 4 lengths; Gait in hallway with vertical ball lifts with head/eye follow 2 x 70'; Gait in hallway with horizontal ball passes between hands with head/eye follow 2 x 70'; Gait in hallway with horizontal ball passes between hands with head/eye follow 2 x 70'   Pt demonstrates excellent motivation during session today. Continued with gaze  stabilization exercises during session today which reproduce pt's feeling of dizziness in standing.  She demonstrates severe staggering during head turning activities while walking in the hallway. Progressed balance exercises during session today. Pt encouraged to follow-up as scheduled. Pt will benefit from PT services to address deficits in balance and dizziness in order to return to full function at home.                 PT Short Term Goals - 11/15/21 2203       PT SHORT TERM GOAL #1   Title Pt will be independent with HEP for dizziness, strength, and balance in order to decrease fall risk and improve function at home with less dizziness.    Time 4    Period Weeks    Status New    Target Date 12/12/21               PT Long Term Goals - 11/21/21 1725       PT LONG TERM GOAL #1   Title Pt will improve BERG by at least 3 points in order to demonstrate clinically significant improvement in balance.    Baseline 11/14/21: to be completed; 11/21/21: 43/56    Time 8    Period Weeks    Status New    Target Date 01/09/22      PT LONG TERM GOAL #2   Title Pt will increase FOTO to at least 60 in order to demonstrate significant improvement in function related to vertigo    Baseline 11/14/21: 50    Time 8    Period Weeks    Status New    Target Date 01/09/22      PT LONG TERM GOAL #3   Title Pt will improve ABC by at least 13% in order to demonstrate clinically significant improvement in balance confidence.    Baseline 11/14/21: to be completed; 11/21/21: 57.5%    Time 8    Period Weeks    Status New    Target Date 01/09/22      PT LONG TERM GOAL #4   Title Pt will improve DGI by at least 3 points in order to demonstrate clinically significant improvement in balance and decreased risk for falls    Baseline 11/21/21: 15/24    Time 8    Period Weeks    Status New    Target Date 01/09/22                   Plan - 12/05/21 1609     Clinical Impression  Statement Pt demonstrates excellent motivation during session today. Continued with gaze stabilization exercises during session today which reproduce pt's feeling of dizziness in standing.  She demonstrates severe staggering during head turning activities while walking in the hallway. Progressed balance exercises during session today. Pt encouraged to follow-up as scheduled. Pt will benefit from PT services to address deficits in balance and dizziness in order to  return to full function at home.    Personal Factors and Comorbidities Age;Comorbidity 3+;Past/Current Experience;Time since onset of injury/illness/exacerbation    Comorbidities DM, aortic atherosclersis, SVT, headache, vertigo    Examination-Activity Limitations Bend;Locomotion Level;Stand;Transfers    Examination-Participation Restrictions Community Activity;Cleaning;Meal Prep    Stability/Clinical Decision Making Unstable/Unpredictable    Rehab Potential Fair    PT Frequency 2x / week    PT Duration 8 weeks    PT Treatment/Interventions ADLs/Self Care Home Management;Aquatic Therapy;Biofeedback;Canalith Repostioning;Cryotherapy;Electrical Stimulation;Iontophoresis 66m/ml Dexamethasone;Moist Heat;Traction;Ultrasound;DME Instruction;Gait training;Stair training;Functional mobility training;Therapeutic activities;Therapeutic exercise;Balance training;Neuromuscular re-education;Patient/family education;Manual techniques;Passive range of motion;Dry needling;Taping;Vestibular;Spinal Manipulations;Joint Manipulations;Visual/perceptual remediation/compensation;Cognitive remediation    PT Next Visit Plan Review HEP, continue gaze stabilization exercises and habituation exercises    PT Home Exercise Plan Access Code: [email or web address]   Consulted and Agree with Plan of Care Patient;Family member/caregiver    Family Member Consulted Son              Patient will benefit from skilled therapeutic intervention in order to improve the following deficits  and impairments:  Decreased balance, Dizziness  Visit Diagnosis: Dizziness and giddiness  Unsteadiness on feet     Problem List Patient Active Problem List   Diagnosis Date Noted   Vertigo 10/26/2021   Elevated troponin 10/26/2021   Leukocytosis 10/26/2021   Elevated lactic acid level 10/26/2021   Nausea vomiting and diarrhea 10/26/2021   Aortic atherosclerosis ([place] 06/26/2021   Auditory hallucinations 04/10/2021   Right shoulder pain 10/02/2020   CKD (chronic kidney disease), stage IIIa 10/02/2020   Elevated TSH 10/02/2020   Weakness 05/29/2020   Type 2 diabetes mellitus with hyperglycemia ([place] 01/07/2020   GERD (gastroesophageal reflux disease) 11/09/2018   Dizziness 06/02/2017   Vaginal atrophy 09/27/2016   SVT (supraventricular tachycardia) ([place] 01/27/2016   Headache 01/26/2016   Left arm numbness 01/26/2016   Urethral caruncle 12/23/2015   Vaginal pessary in situ 09/22/2015   Health care maintenance 07/22/2015   Baden-Walker grade 4 cystocele 06/26/2015   First degree uterine prolapse 06/26/2015   Hyperkalemia 04/27/2015   Arrhythmia 04/27/2015   SOB (shortness of breath) 04/27/2015   Double vision 07/18/2014   Decreased hearing 07/18/2014   Stress 07/18/2014   Fatigue 07/18/2014   Environmental allergies 07/09/2013   Hypertension 12/09/2012   Hypercholesterolemia 12/09/2012   JLyndel SafeHuprich PT, DPT, GCS  Gara Kincade, PT 12/06/2021, 1:49 PM  [facility] [place] [address]Phone: [phone]  Fax:  [phone] Name: CShalia BartkoWard MRN: [id number] of Birth: 209-15-1928

## 2021-12-07 ENCOUNTER — Ambulatory Visit: Payer: Medicare Other

## 2021-12-12 ENCOUNTER — Ambulatory Visit: Payer: Medicare Other

## 2021-12-13 ENCOUNTER — Ambulatory Visit: Payer: Medicare Other

## 2021-12-13 ENCOUNTER — Other Ambulatory Visit: Payer: Self-pay

## 2021-12-13 VITALS — BP 147/63 | HR 65

## 2021-12-13 DIAGNOSIS — R42 Dizziness and giddiness: Secondary | ICD-10-CM | POA: Diagnosis not present

## 2021-12-13 DIAGNOSIS — R2681 Unsteadiness on feet: Secondary | ICD-10-CM

## 2021-12-13 NOTE — Therapy (Signed)
[facility] [address] [place], [place], [address] Phone: [phone]   Fax:  [phone]  Physical Therapy Treatment  Patient Details  Name: Elizabeth [name] MRN: [id number] Date of Birth: 11/02/1927 Referring Provider (PT): Einar Pheasant    Encounter Date: 12/13/2021   PT End of Session - 12/13/21 1108     Visit Number 5    Number of Visits 17    Date for PT Re-Evaluation 01/09/22    Authorization Type eval: 11/14/21, VL: based on MN  Deductible: met  co-ins:20%  OOP:$[id number]/$[id number] remains  no auth req    PT Start Time 1105    PT Stop Time 1150    PT Time Calculation (min) 45 min    Activity Tolerance Patient tolerated treatment well    Behavior During Therapy [facility] for tasks assessed/performed              Past Medical History:  Diagnosis Date   Cystocele or rectocele with incomplete uterine prolapse    Hyperlipidemia    Hypertension    Irritable bowel syndrome    Palpitations    Vaginal atrophy     Past Surgical History:  Procedure Laterality Date   CATARACT EXTRACTION  2006    Vitals:   12/13/21 1110  BP: (!) 147/63  Pulse: 65  SpO2: 100%      Subjective Assessment - 12/13/21 1107     Subjective Pt reports that she is doing alright but has been having a lot of dizziness today. She continues to have a lot of fluctuation with her symptoms with some good days and some bad days.    Pertinent History Elizabeth [name] is a 87 y.o. female with medical history significant of hypertension, hyperlipidemia, diet-controlled diabetes, GERD, vertigo, SVT, IBS, cystocele, CKD-3, who was recently admitted from 10/26/21-10/29/21 with vertigo.  Patient was also reporting history of diarrhea and having dry heaves.  On presentation lab work showed WBC count of 15.2, BNP of 521, lactic acid of 3.5.  CT head negative for intracranial normalities. MRI was negative for stroke.  Chest x-ray was negative for pneumonia.  Patient was  admitted for further management. PT/OT assessed her and recommended outpatient physical therapy follow-up for vestibular therapy. Symptoms never resolved however have improved since discharge. At this time she mostly reports feeling unsteady. "It feels like my head is heavy." She has a history of dizziness/vertigo for multiple years. Prior history of ENT evaluation years ago but pt doesn't recall any specific diagnosis however she does state that she has been diagnosed with labyrinthitis and Meniere's Disease in the past. History of bilateral hearing loss, worse on the right side. She had an appointment with Dr. Manuella Ghazi due to some auditory hallucinations but this was attributed to her hearing loss. Per pt and son she reports that all of her cognition is normal.  Lives alone and occasionally reports feeling lonely however denies being depressed. History of migraines marked by floaters but denies headache pain.    Limitations Walking    Diagnostic tests see history    Patient Stated Goals Decrease dizziness, improve balance    Currently in Pain? Other (Comment)   Unrelated to current episode              TREATMENT   Neuromuscular Re-education  VOR x 1 horizontal during forward ambulation with object on plain wall 3 x 70'; Gait in hallway with vertical ball lifts with head/eye follow 2 x 70'; Gait in  hallway with horizontal ball passes between hands with head/eye follow 2 x 70'; Gait in hallway with horizontal ball passes to therapist with head/eye follow 2 x 70' each direction;  Following balance exercises performed without UE support unless otherwise specified:  Tandem balance alternating forward LE 2 x 30s each; Tandem gait 15' x 4; Alternating 6" cone taps x 10 BLE; Tandem balance alternating forward LE with horizontal head turns x 30s with each foot forward. Sit to stand from regular height chair, no UE support, alternating horizontal head turns during stand x 10; Sit to stand from  regular height chair with Airex pad under feet, no UE support, alternating horizontal head turns during stand x 10;    Pt demonstrates excellent motivation during session today.  She continues report frustration with fluctuating dizziness.  She also continues to report a poor understanding of the cause of her symptoms.  Continued with gaze stabilization exercises during session today. She demonstrates severe staggering during head turning activities while walking in the hallway. Progressed balance exercises during session today. Pt encouraged to follow-up as scheduled. Pt will benefit from PT services to address deficits in balance and dizziness in order to return to full function at home.                 PT Short Term Goals - 11/15/21 2203       PT SHORT TERM GOAL #1   Title Pt will be independent with HEP for dizziness, strength, and balance in order to decrease fall risk and improve function at home with less dizziness.    Time 4    Period Weeks    Status New    Target Date 12/12/21               PT Long Term Goals - 11/21/21 1725       PT LONG TERM GOAL #1   Title Pt will improve BERG by at least 3 points in order to demonstrate clinically significant improvement in balance.    Baseline 11/14/21: to be completed; 11/21/21: 43/56    Time 8    Period Weeks    Status New    Target Date 01/09/22      PT LONG TERM GOAL #2   Title Pt will increase FOTO to at least 60 in order to demonstrate significant improvement in function related to vertigo    Baseline 11/14/21: 50    Time 8    Period Weeks    Status New    Target Date 01/09/22      PT LONG TERM GOAL #3   Title Pt will improve ABC by at least 13% in order to demonstrate clinically significant improvement in balance confidence.    Baseline 11/14/21: to be completed; 11/21/21: 57.5%    Time 8    Period Weeks    Status New    Target Date 01/09/22      PT LONG TERM GOAL #4   Title Pt will improve DGI by  at least 3 points in order to demonstrate clinically significant improvement in balance and decreased risk for falls    Baseline 11/21/21: 15/24    Time 8    Period Weeks    Status New    Target Date 01/09/22                   Plan - 12/13/21 1108     Clinical Impression Statement Pt demonstrates excellent motivation during session today.  She continues  report frustration with fluctuating dizziness.  She also continues to report a poor understanding of the cause of her symptoms.  Continued with gaze stabilization exercises during session today. She demonstrates severe staggering during head turning activities while walking in the hallway. Progressed balance exercises during session today. Pt encouraged to follow-up as scheduled. Pt will benefit from PT services to address deficits in balance and dizziness in order to return to full function at home.    Personal Factors and Comorbidities Age;Comorbidity 3+;Past/Current Experience;Time since onset of injury/illness/exacerbation    Comorbidities DM, aortic atherosclersis, SVT, headache, vertigo    Examination-Activity Limitations Bend;Locomotion Level;Stand;Transfers    Examination-Participation Restrictions Community Activity;Cleaning;Meal Prep    Stability/Clinical Decision Making Unstable/Unpredictable    Rehab Potential Fair    PT Frequency 2x / week    PT Duration 8 weeks    PT Treatment/Interventions ADLs/Self Care Home Management;Aquatic Therapy;Biofeedback;Canalith Repostioning;Cryotherapy;Electrical Stimulation;Iontophoresis 77m/ml Dexamethasone;Moist Heat;Traction;Ultrasound;DME Instruction;Gait training;Stair training;Functional mobility training;Therapeutic activities;Therapeutic exercise;Balance training;Neuromuscular re-education;Patient/family education;Manual techniques;Passive range of motion;Dry needling;Taping;Vestibular;Spinal Manipulations;Joint Manipulations;Visual/perceptual remediation/compensation;Cognitive  remediation    PT Next Visit Plan Review HEP, continue gaze stabilization exercises and habituation exercises    PT Home Exercise Plan Access Code: [email or web address]   Consulted and Agree with Plan of Care Patient;Family member/caregiver    Family Member Consulted Son              Patient will benefit from skilled therapeutic intervention in order to improve the following deficits and impairments:  Decreased balance, Dizziness  Visit Diagnosis: Dizziness and giddiness  Unsteadiness on feet     Problem List Patient Active Problem List   Diagnosis Date Noted   Vertigo 10/26/2021   Elevated troponin 10/26/2021   Leukocytosis 10/26/2021   Elevated lactic acid level 10/26/2021   Nausea vomiting and diarrhea 10/26/2021   Aortic atherosclerosis ([place] 06/26/2021   Auditory hallucinations 04/10/2021   Right shoulder pain 10/02/2020   CKD (chronic kidney disease), stage IIIa 10/02/2020   Elevated TSH 10/02/2020   Weakness 05/29/2020   Type 2 diabetes mellitus with hyperglycemia ([place] 01/07/2020   GERD (gastroesophageal reflux disease) 11/09/2018   Dizziness 06/02/2017   Vaginal atrophy 09/27/2016   SVT (supraventricular tachycardia) ([place] 01/27/2016   Headache 01/26/2016   Left arm numbness 01/26/2016   Urethral caruncle 12/23/2015   Vaginal pessary in situ 09/22/2015   Health care maintenance 07/22/2015   Baden-Walker grade 4 cystocele 06/26/2015   First degree uterine prolapse 06/26/2015   Hyperkalemia 04/27/2015   Arrhythmia 04/27/2015   SOB (shortness of breath) 04/27/2015   Double vision 07/18/2014   Decreased hearing 07/18/2014   Stress 07/18/2014   Fatigue 07/18/2014   Environmental allergies 07/09/2013   Hypertension 12/09/2012   Hypercholesterolemia 12/09/2012   JLyndel SafeHuprich PT, DPT, GCS  Zeppelin Commisso, PT 12/14/2021, 9:28 AM   [facility] [place] [address]Phone: [phone]  Fax:   [phone] Name: CAyah CozzolinoWard MRN: [id number] of Birth: 215-Dec-1928

## 2021-12-14 ENCOUNTER — Ambulatory Visit: Payer: Medicare Other

## 2021-12-14 LAB — HM DIABETES EYE EXAM

## 2021-12-19 ENCOUNTER — Ambulatory Visit: Payer: Medicare Other

## 2021-12-19 ENCOUNTER — Other Ambulatory Visit: Payer: Self-pay

## 2021-12-19 DIAGNOSIS — R42 Dizziness and giddiness: Secondary | ICD-10-CM

## 2021-12-19 DIAGNOSIS — R2681 Unsteadiness on feet: Secondary | ICD-10-CM

## 2021-12-19 NOTE — Therapy (Signed)
[facility] [address]. [place], [place], [address] Phone: [phone]   Fax:  [phone]  Physical Therapy Treatment  Patient Details  Name: Elizabeth [name] MRN: [id number] Date of Birth: 07/31/1927 Referring Provider (PT): Einar Pheasant    Encounter Date: 12/19/2021   PT End of Session - 12/19/21 1620     Visit Number 6    Number of Visits 17    Date for PT Re-Evaluation 01/09/22    Authorization Type eval: 11/14/21, VL: based on [place]  Deductible: met  co-ins:20%  OOP:$[id number]/$[id number] remains  no auth req    PT Start Time 1615    PT Stop Time 1700    PT Time Calculation (min) 45 min    Activity Tolerance Patient tolerated treatment well    Behavior During Therapy [facility] for tasks assessed/performed              Past Medical History:  Diagnosis Date   Cystocele or rectocele with incomplete uterine prolapse    Hyperlipidemia    Hypertension    Irritable bowel syndrome    Palpitations    Vaginal atrophy     Past Surgical History:  Procedure Laterality Date   CATARACT EXTRACTION  2006    There were no vitals filed for this visit.     Subjective Assessment - 12/19/21 1620     Subjective Pt reports that she is doing alright today. She continues to have significant fluctuation in her dizziness with some good days and some bad days. No specific question currently.    Pertinent History Elizabeth [name] is a 87 y.o. female with medical history significant of hypertension, hyperlipidemia, diet-controlled diabetes, GERD, vertigo, SVT, IBS, cystocele, CKD-3, who was recently admitted from 10/26/21-10/29/21 with vertigo.  Patient was also reporting history of diarrhea and having dry heaves.  On presentation lab work showed WBC count of 15.2, BNP of 521, lactic acid of 3.5.  CT head negative for intracranial normalities. MRI was negative for stroke.  Chest x-ray was negative for pneumonia.  Patient was admitted for further management.  PT/OT assessed her and recommended outpatient physical therapy follow-up for vestibular therapy. Symptoms never resolved however have improved since discharge. At this time she mostly reports feeling unsteady. "It feels like my head is heavy." She has a history of dizziness/vertigo for multiple years. Prior history of ENT evaluation years ago but pt doesn't recall any specific diagnosis however she does state that she has been diagnosed with labyrinthitis and Meniere's Disease in the past. History of bilateral hearing loss, worse on the right side. She had an appointment with Dr. Manuella Ghazi due to some auditory hallucinations but this was attributed to her hearing loss. Per pt and son she reports that all of her cognition is normal.  Lives alone and occasionally reports feeling lonely however denies being depressed. History of migraines marked by floaters but denies headache pain.    Limitations Walking    Diagnostic tests see history    Patient Stated Goals Decrease dizziness, improve balance               TREATMENT   Neuromuscular Re-education  VOR x 1 horizontal during forward and retro ambulation with object on plain wall 3 x 70' each; Gait in hallway with vertical ball tosses with head/eye follow 2 x 70'; Gait in hallway with horizontal ball tosses to therapist with head/eye follow 2 x 70' each direction;  Dix-Hallpike and Roll Tests performed which are negative  bilaterally;  Tandem balance alternating forward LE x 30s each; Tandem gait in // bars x 4 lengths; Airex alternating 6" step taps x 20 BLE; Airex semitandem balance with front foot on 6" step alternating forward LE x 30s each; Sit to stand from regular height chair with Airex pad under feet, no UE support, alternating horizontal head turns during stand x 10;   Pt demonstrates excellent motivation during session today. She continues to report frustration with fluctuating dizziness with minimal/no change in symptoms since starting  with therapy. Continued with gaze stabilization exercises during session today and progressed to retro ambulation. She continues to demonstrate severe staggering during head turning activities while walking in the hallway. Pt encouraged to follow-up as scheduled. She will benefit from PT services to address deficits in balance and dizziness in order to return to full function at home.                 PT Short Term Goals - 11/15/21 2203       PT SHORT TERM GOAL #1   Title Pt will be independent with HEP for dizziness, strength, and balance in order to decrease fall risk and improve function at home with less dizziness.    Time 4    Period Weeks    Status New    Target Date 12/12/21               PT Long Term Goals - 11/21/21 1725       PT LONG TERM GOAL #1   Title Pt will improve BERG by at least 3 points in order to demonstrate clinically significant improvement in balance.    Baseline 11/14/21: to be completed; 11/21/21: 43/56    Time 8    Period Weeks    Status New    Target Date 01/09/22      PT LONG TERM GOAL #2   Title Pt will increase FOTO to at least 60 in order to demonstrate significant improvement in function related to vertigo    Baseline 11/14/21: 50    Time 8    Period Weeks    Status New    Target Date 01/09/22      PT LONG TERM GOAL #3   Title Pt will improve ABC by at least 13% in order to demonstrate clinically significant improvement in balance confidence.    Baseline 11/14/21: to be completed; 11/21/21: 57.5%    Time 8    Period Weeks    Status New    Target Date 01/09/22      PT LONG TERM GOAL #4   Title Pt will improve DGI by at least 3 points in order to demonstrate clinically significant improvement in balance and decreased risk for falls    Baseline 11/21/21: 15/24    Time 8    Period Weeks    Status New    Target Date 01/09/22                   Plan - 12/19/21 1621     Clinical Impression Statement Pt  demonstrates excellent motivation during session today. She continues to report frustration with fluctuating dizziness with minimal/no change in symptoms since starting with therapy. Continued with gaze stabilization exercises during session today and progressed to retro ambulation. She continues to demonstrate severe staggering during head turning activities while walking in the hallway. Pt encouraged to follow-up as scheduled. She will benefit from PT services to address deficits in balance and dizziness in  order to return to full function at home.    Personal Factors and Comorbidities Age;Comorbidity 3+;Past/Current Experience;Time since onset of injury/illness/exacerbation    Comorbidities DM, aortic atherosclersis, SVT, headache, vertigo    Examination-Activity Limitations Bend;Locomotion Level;Stand;Transfers    Examination-Participation Restrictions Community Activity;Cleaning;Meal Prep    Stability/Clinical Decision Making Unstable/Unpredictable    Rehab Potential Fair    PT Frequency 2x / week    PT Duration 8 weeks    PT Treatment/Interventions ADLs/Self Care Home Management;Aquatic Therapy;Biofeedback;Canalith Repostioning;Cryotherapy;Electrical Stimulation;Iontophoresis 8m/ml Dexamethasone;Moist Heat;Traction;Ultrasound;DME Instruction;Gait training;Stair training;Functional mobility training;Therapeutic activities;Therapeutic exercise;Balance training;Neuromuscular re-education;Patient/family education;Manual techniques;Passive range of motion;Dry needling;Taping;Vestibular;Spinal Manipulations;Joint Manipulations;Visual/perceptual remediation/compensation;Cognitive remediation    PT Next Visit Plan Review HEP, continue gaze stabilization exercises and habituation exercises    PT Home Exercise Plan Access Code: [email or web address]   Consulted and Agree with Plan of Care Patient;Family member/caregiver    Family Member Consulted Son              Patient will benefit from skilled therapeutic  intervention in order to improve the following deficits and impairments:  Decreased balance, Dizziness  Visit Diagnosis: Dizziness and giddiness  Unsteadiness on feet     Problem List Patient Active Problem List   Diagnosis Date Noted   Vertigo 10/26/2021   Elevated troponin 10/26/2021   Leukocytosis 10/26/2021   Elevated lactic acid level 10/26/2021   Nausea vomiting and diarrhea 10/26/2021   Aortic atherosclerosis ([place] 06/26/2021   Auditory hallucinations 04/10/2021   Right shoulder pain 10/02/2020   CKD (chronic kidney disease), stage IIIa 10/02/2020   Elevated TSH 10/02/2020   Weakness 05/29/2020   Type 2 diabetes mellitus with hyperglycemia ([place] 01/07/2020   GERD (gastroesophageal reflux disease) 11/09/2018   Dizziness 06/02/2017   Vaginal atrophy 09/27/2016   SVT (supraventricular tachycardia) ([facility] 01/27/2016   Headache 01/26/2016   Left arm numbness 01/26/2016   Urethral caruncle 12/23/2015   Vaginal pessary in situ 09/22/2015   Health care maintenance 07/22/2015   Baden-Walker grade 4 cystocele 06/26/2015   First degree uterine prolapse 06/26/2015   Hyperkalemia 04/27/2015   Arrhythmia 04/27/2015   SOB (shortness of breath) 04/27/2015   Double vision 07/18/2014   Decreased hearing 07/18/2014   Stress 07/18/2014   Fatigue 07/18/2014   Environmental allergies 07/09/2013   Hypertension 12/09/2012   Hypercholesterolemia 12/09/2012   JLyndel SafeHuprich PT, DPT, GCS  Tyreonna Czaplicki, PT 12/19/2021, 8:38 PM  [place] [facility] [place] [address]Phone: [phone]  Fax:  [phone] Name: CSheree LallaWard MRN: [id number] of Birth: [birth year]

## 2021-12-21 ENCOUNTER — Ambulatory Visit: Payer: Medicare Other

## 2021-12-26 ENCOUNTER — Ambulatory Visit: Payer: Medicare Other

## 2021-12-28 ENCOUNTER — Ambulatory Visit: Payer: Medicare Other

## 2021-12-29 ENCOUNTER — Other Ambulatory Visit: Payer: Self-pay

## 2021-12-29 ENCOUNTER — Ambulatory Visit: Payer: Medicare Other | Attending: Internal Medicine

## 2021-12-29 ENCOUNTER — Ambulatory Visit (INDEPENDENT_AMBULATORY_CARE_PROVIDER_SITE_OTHER): Payer: Medicare Other | Admitting: Internal Medicine

## 2021-12-29 VITALS — BP 126/72 | HR 78 | Temp 97.8°F | Resp 16 | Ht 61.0 in | Wt 144.0 lb

## 2021-12-29 DIAGNOSIS — R778 Other specified abnormalities of plasma proteins: Secondary | ICD-10-CM

## 2021-12-29 DIAGNOSIS — F439 Reaction to severe stress, unspecified: Secondary | ICD-10-CM

## 2021-12-29 DIAGNOSIS — R42 Dizziness and giddiness: Secondary | ICD-10-CM | POA: Insufficient documentation

## 2021-12-29 DIAGNOSIS — I700 Atherosclerosis of aorta: Secondary | ICD-10-CM

## 2021-12-29 DIAGNOSIS — Z9109 Other allergy status, other than to drugs and biological substances: Secondary | ICD-10-CM

## 2021-12-29 DIAGNOSIS — I471 Supraventricular tachycardia: Secondary | ICD-10-CM

## 2021-12-29 DIAGNOSIS — E1165 Type 2 diabetes mellitus with hyperglycemia: Secondary | ICD-10-CM

## 2021-12-29 DIAGNOSIS — E538 Deficiency of other specified B group vitamins: Secondary | ICD-10-CM

## 2021-12-29 DIAGNOSIS — M6281 Muscle weakness (generalized): Secondary | ICD-10-CM | POA: Insufficient documentation

## 2021-12-29 DIAGNOSIS — R2681 Unsteadiness on feet: Secondary | ICD-10-CM | POA: Insufficient documentation

## 2021-12-29 DIAGNOSIS — I10 Essential (primary) hypertension: Secondary | ICD-10-CM | POA: Diagnosis not present

## 2021-12-29 DIAGNOSIS — E7800 Pure hypercholesterolemia, unspecified: Secondary | ICD-10-CM

## 2021-12-29 DIAGNOSIS — R440 Auditory hallucinations: Secondary | ICD-10-CM

## 2021-12-29 DIAGNOSIS — N1831 Chronic kidney disease, stage 3a: Secondary | ICD-10-CM

## 2021-12-29 LAB — VITAMIN B12: Vitamin B-12: 275 pg/mL (ref 211–911)

## 2021-12-29 LAB — BASIC METABOLIC PANEL
BUN: 25 mg/dL — ABNORMAL HIGH (ref 6–23)
CO2: 21 mEq/L (ref 19–32)
Calcium: 8.9 mg/dL (ref 8.4–10.5)
Chloride: 107 mEq/L (ref 96–112)
Creatinine, Ser: 1.32 mg/dL — ABNORMAL HIGH (ref 0.40–1.20)
GFR: 34.95 mL/min — ABNORMAL LOW (ref >60.00)
Glucose, Bld: 137 mg/dL — ABNORMAL HIGH (ref 70–99)
Potassium: 4.5 mEq/L (ref 3.5–5.1)
Sodium: 139 mEq/L (ref 135–145)

## 2021-12-29 MED ORDER — LEVOCETIRIZINE DIHYDROCHLORIDE 5 MG PO TABS: 5.0000 mg | ORAL_TABLET | Freq: Every day | ORAL | 1 refills | Status: DC

## 2021-12-29 NOTE — Therapy (Signed)
[facility] [address] [place], [place], [address] Phone: [phone]   Fax:  [phone]  Physical Therapy Treatment  Patient Details  Name: Elizabeth [name] MRN: [id number] Date of Birth: [birth year] Referring Provider (PT): Einar Pheasant    Encounter Date: 12/29/2021   PT End of Session - 12/29/21 1150     Visit Number 7    Number of Visits 17    Date for PT Re-Evaluation 01/09/22    Authorization Type eval: 11/14/21, VL: based on [place]  Deductible: met  co-ins:20%  OOP:$[id number]/$[id number] remains  no auth req    PT Start Time 1145    PT Stop Time 1230    PT Time Calculation (min) 45 min    Activity Tolerance Patient tolerated treatment well    Behavior During Therapy [facility] for tasks assessed/performed              Past Medical History:  Diagnosis Date   Cystocele or rectocele with incomplete uterine prolapse    Hyperlipidemia    Hypertension    Irritable bowel syndrome    Palpitations    Vaginal atrophy     Past Surgical History:  Procedure Laterality Date   CATARACT EXTRACTION  2006    There were no vitals filed for this visit.     Subjective Assessment - 12/29/21 1148     Subjective Pt reports that she is doing alright today. She continues to have fluctuation in her dizziness but she does report some improvement in her symptoms. She is having a longer intervals between bad days. No specific question currently.    Pertinent History Elizabeth [name] is a 88 y.o. female with medical history significant of hypertension, hyperlipidemia, diet-controlled diabetes, GERD, vertigo, SVT, IBS, cystocele, CKD-3, who was recently admitted from 10/26/21-10/29/21 with vertigo.  Patient was also reporting history of diarrhea and having dry heaves.  On presentation lab work showed WBC count of 15.2, BNP of 521, lactic acid of 3.5.  CT head negative for intracranial normalities. MRI was negative for stroke.  Chest x-ray was negative for  pneumonia.  Patient was admitted for further management. PT/OT assessed her and recommended outpatient physical therapy follow-up for vestibular therapy. Symptoms never resolved however have improved since discharge. At this time she mostly reports feeling unsteady. "It feels like my head is heavy." She has a history of dizziness/vertigo for multiple years. Prior history of ENT evaluation years ago but pt doesn't recall any specific diagnosis however she does state that she has been diagnosed with labyrinthitis and Meniere's Disease in the past. History of bilateral hearing loss, worse on the right side. She had an appointment with Dr. Manuella Ghazi due to some auditory hallucinations but this was attributed to her hearing loss. Per pt and son she reports that all of her cognition is normal.  Lives alone and occasionally reports feeling lonely however denies being depressed. History of migraines marked by floaters but denies headache pain.    Limitations Walking    Diagnostic tests see history    Patient Stated Goals Decrease dizziness, improve balance    Currently in Pain? No/denies               TREATMENT   Neuromuscular Re-education  NuStep L2 x 5 minutes for warm-up during interval history; VOR x 1 horizontal during forward and retro ambulation with object on plain wall 3 x 70' each; Forward/retro gait in hallway with vertical ball tosses to self with head/eye follow  x 70' each direction; Forward/retro gait in hallway with horizontal ball passes between hands with head/eye follow x 70' forward/70' backward toward each side; Forward/retro gait in hallway with horizontal ball passes with therapist with head/eye follow x 70' forward/70' backward toward each side; Tandem balance alternating forward LE x 30s each; Tandem gait in // bars x 4 lengths; Sit to stand from regular height chair with Airex pad under feet, no UE support, alternating horizontal head turns during stand x 10, alternating  vertical head turns x 10;   Airex alternating 6" step taps x 20 BLE; Airex semitandem balance with front foot on 6" step alternating forward LE x 30s each;    Pt demonstrates excellent motivation during session today. She continues to report frustration with fluctuating dizziness however is now noticing some improvement in her symptoms. Continued with gaze stabilization exercises during session today and progressed retro ambulation exercises. Less staggering noted today during head turning activities while walking in the hallway. Pt encouraged to follow-up as scheduled. She will benefit from PT services to address deficits in balance and dizziness in order to return to full function at home.                 PT Short Term Goals - 11/15/21 2203       PT SHORT TERM GOAL #1   Title Pt will be independent with HEP for dizziness, strength, and balance in order to decrease fall risk and improve function at home with less dizziness.    Time 4    Period Weeks    Status New    Target Date 12/12/21               PT Long Term Goals - 11/21/21 1725       PT LONG TERM GOAL #1   Title Pt will improve BERG by at least 3 points in order to demonstrate clinically significant improvement in balance.    Baseline 11/14/21: to be completed; 11/21/21: 43/56    Time 8    Period Weeks    Status New    Target Date 01/09/22      PT LONG TERM GOAL #2   Title Pt will increase FOTO to at least 60 in order to demonstrate significant improvement in function related to vertigo    Baseline 11/14/21: 50    Time 8    Period Weeks    Status New    Target Date 01/09/22      PT LONG TERM GOAL #3   Title Pt will improve ABC by at least 13% in order to demonstrate clinically significant improvement in balance confidence.    Baseline 11/14/21: to be completed; 11/21/21: 57.5%    Time 8    Period Weeks    Status New    Target Date 01/09/22      PT LONG TERM GOAL #4   Title Pt will improve  DGI by at least 3 points in order to demonstrate clinically significant improvement in balance and decreased risk for falls    Baseline 11/21/21: 15/24    Time 8    Period Weeks    Status New    Target Date 01/09/22                   Plan - 12/29/21 1150     Clinical Impression Statement Pt demonstrates excellent motivation during session today. She continues to report frustration with fluctuating dizziness however is now noticing some improvement in her  symptoms. Continued with gaze stabilization exercises during session today and progressed retro ambulation exercises. Less staggering noted today during head turning activities while walking in the hallway. Pt encouraged to follow-up as scheduled. She will benefit from PT services to address deficits in balance and dizziness in order to return to full function at home.    Personal Factors and Comorbidities Age;Comorbidity 3+;Past/Current Experience;Time since onset of injury/illness/exacerbation    Comorbidities DM, aortic atherosclersis, SVT, headache, vertigo    Examination-Activity Limitations Bend;Locomotion Level;Stand;Transfers    Examination-Participation Restrictions Community Activity;Cleaning;Meal Prep    Stability/Clinical Decision Making Unstable/Unpredictable    Rehab Potential Fair    PT Frequency 2x / week    PT Duration 8 weeks    PT Treatment/Interventions ADLs/Self Care Home Management;Aquatic Therapy;Biofeedback;Canalith Repostioning;Cryotherapy;Electrical Stimulation;Iontophoresis 1m/ml Dexamethasone;Moist Heat;Traction;Ultrasound;DME Instruction;Gait training;Stair training;Functional mobility training;Therapeutic activities;Therapeutic exercise;Balance training;Neuromuscular re-education;Patient/family education;Manual techniques;Passive range of motion;Dry needling;Taping;Vestibular;Spinal Manipulations;Joint Manipulations;Visual/perceptual remediation/compensation;Cognitive remediation    PT Next Visit Plan  Review HEP, continue gaze stabilization exercises and habituation exercises    PT Home Exercise Plan Access Code: [email or web address]   Consulted and Agree with Plan of Care Patient;Family member/caregiver    Family Member Consulted Son              Patient will benefit from skilled therapeutic intervention in order to improve the following deficits and impairments:  Decreased balance, Dizziness  Visit Diagnosis: Dizziness and giddiness  Unsteadiness on feet     Problem List Patient Active Problem List   Diagnosis Date Noted   Vertigo 10/26/2021   Elevated troponin 10/26/2021   Leukocytosis 10/26/2021   Elevated lactic acid level 10/26/2021   Nausea vomiting and diarrhea 10/26/2021   Aortic atherosclerosis ([facility] 06/26/2021   Auditory hallucinations 04/10/2021   Right shoulder pain 10/02/2020   CKD (chronic kidney disease), stage IIIa 10/02/2020   Elevated TSH 10/02/2020   Weakness 05/29/2020   Type 2 diabetes mellitus with hyperglycemia ([facility] 01/07/2020   GERD (gastroesophageal reflux disease) 11/09/2018   Dizziness 06/02/2017   Vaginal atrophy 09/27/2016   SVT (supraventricular tachycardia) ([facility] 01/27/2016   Headache 01/26/2016   Left arm numbness 01/26/2016   Urethral caruncle 12/23/2015   Vaginal pessary in situ 09/22/2015   Health care maintenance 07/22/2015   Baden-Walker grade 4 cystocele 06/26/2015   First degree uterine prolapse 06/26/2015   Hyperkalemia 04/27/2015   Arrhythmia 04/27/2015   SOB (shortness of breath) 04/27/2015   Double vision 07/18/2014   Decreased hearing 07/18/2014   Stress 07/18/2014   Fatigue 07/18/2014   Environmental allergies 07/09/2013   Hypertension 12/09/2012   Hypercholesterolemia 12/09/2012   JLyndel SafeHuprich PT, DPT, GCS  Yarielis Funaro, PT 12/29/2021, 12:53 PM  [place] [facility] [place] [address]Phone: [phone]  Fax:  [phone] Name: CMonice Lundy Aristizabal MRN: [id number] of Birth: 204-Nov-1928

## 2021-12-29 NOTE — Progress Notes (Signed)
Patient ID: Elizabeth [name], female   DOB: 12-Jun-1927, 88 y.o.   MRN: [id number]   Subjective:    Patient ID: Elizabeth [name] Apps, female    DOB: October 27, 1927, 88 y.o.   MRN: [id number]  This visit occurred during the SARS-CoV-2 public health emergency.  Safety protocols were in place, including screening questions prior to the visit, additional usage of staff PPE, and extensive cleaning of exam room while observing appropriate contact time as indicated for disinfecting solutions.   Patient here for a scheduled physical exam.   Chief Complaint  Patient presents with   Annual Exam   Dizziness   .   HPI Persistent dizziness/unsteadiness.  Recently admitted 10/26/21 - 10/29/21 - vertigo, dry heaves and diarrhea.  CT head negative.  CXR negative for pneumonia.  PT/OT evaluated and recommended vestibular therapy.  Discussed at her last visit with me - 11/08/21.  Declined referral back to ENT.  Did agree to therapy.  Has been going to therapy regularly since her last visit here.  Still reports head feels heavy.  Is unsteady.  She does appear to be better.  Reports when has been lying in bed and gets up - not dizzy.  After moving around - will notice the symptoms.  If she is sitting and reading - ok.  If standing and painting - ok.  She had ophthalmology evaluation (Dr Edison Pace) - states eyes ok.  Walking forward - improvement.  When trying to walk backwards - feels like she is falling.  No chest pain or sob reported.  No increased heart rate or palpitations reported.  No abdominal pain.  Bowels moving.     Past Medical History:  Diagnosis Date   Cystocele or rectocele with incomplete uterine prolapse    Hyperlipidemia    Hypertension    Irritable bowel syndrome    Palpitations    Vaginal atrophy    Past Surgical History:  Procedure Laterality Date   CATARACT EXTRACTION  2006   Family History  Problem Relation Age of Onset   Transient ischemic attack Mother        multiple    Dementia Mother    Stroke  Father    Heart disease Brother        MI - died age 69   Leukemia Brother    Lung cancer Brother    Throat cancer Brother        had heart disease also   Heart disease Sister    Dementia Sister    Social History   Socioeconomic History   Marital status: Widowed    Spouse name: Not on file   Number of children: 2   Years of education: Not on file   Highest education level: Not on file  Occupational History   Not on file  Tobacco Use   Smoking status: Never   Smokeless tobacco: Never  Vaping Use   Vaping Use: Never used  Substance and Sexual Activity   Alcohol use: No    Alcohol/week: 0.0 standard drinks   Drug use: No   Sexual activity: Not Currently    Birth control/protection: None  Other Topics Concern   Not on file  Social History Narrative   Not on file   Social Determinants of Health   Financial Resource Strain: Low Risk    Difficulty of Paying Living Expenses: Not hard at all  Food Insecurity: No Food Insecurity   Worried About [place] in the Last Year: Never true  Ran Out of Food in the Last Year: Never true  Transportation Needs: No Transportation Needs   Lack of Transportation (Medical): No   Lack of Transportation (Non-Medical): No  Physical Activity: Insufficiently Active   Days of Exercise per Week: 7 days   Minutes of Exercise per Session: 20 min  Stress: No Stress Concern Present   Feeling of Stress : Not at all  Social Connections: Unknown   Frequency of Communication with Friends and Family: More than three times a week   Frequency of Social Gatherings with Friends and Family: Not on file   Attends Religious Services: Not on file   Active Member of Clubs or Organizations: Not on file   Attends Archivist Meetings: Not on file   Marital Status: Widowed     Review of Systems  Constitutional:  Negative for appetite change and unexpected weight change.  HENT:  Negative for congestion and sinus pressure.   Respiratory:   Negative for cough, chest tightness and shortness of breath.   Cardiovascular:  Negative for chest pain, palpitations and leg swelling.  Gastrointestinal:  Negative for abdominal pain, diarrhea, nausea and vomiting.  Genitourinary:  Negative for difficulty urinating and dysuria.  Musculoskeletal:  Negative for joint swelling and myalgias.  Skin:  Negative for color change and rash.  Neurological:  Positive for dizziness and light-headedness. Negative for headaches.  Psychiatric/Behavioral:  Negative for agitation.        Increased stress related to her current symptoms.        Objective:     BP 126/72    Pulse 78    Temp 97.8 F (36.6 C)    Resp 16    Ht _0  (1.549 m)    Wt 144 lb (65.3 kg)    SpO2 97%    BMI 27.21 kg/m  Wt Readings from Last 3 Encounters:  12/29/21 144 lb (65.3 kg)  11/10/21 140 lb (63.5 kg)  11/08/21 140 lb (63.5 kg)    Physical Exam Vitals reviewed.  Constitutional:      General: She is not in acute distress.    Appearance: Normal appearance.  HENT:     Head: Normocephalic and atraumatic.     Right Ear: External ear normal.     Left Ear: External ear normal.  Eyes:     General: No scleral icterus.       Right eye: No discharge.        Left eye: No discharge.     Conjunctiva/sclera: Conjunctivae normal.  Neck:     Thyroid: No thyromegaly.  Cardiovascular:     Rate and Rhythm: Normal rate and regular rhythm.  Pulmonary:     Effort: No respiratory distress.     Breath sounds: Normal breath sounds. No wheezing.  Abdominal:     General: Bowel sounds are normal.     Palpations: Abdomen is soft.     Tenderness: There is no abdominal tenderness.  Musculoskeletal:        General: No swelling or tenderness.     Cervical back: Neck supple. No tenderness.  Lymphadenopathy:     Cervical: No cervical adenopathy.  Skin:    Findings: No erythema or rash.  Neurological:     Mental Status: She is alert.     Comments: Able to rise from lying and sitting  position without increased dizziness.    Psychiatric:        Mood and Affect: Mood normal.  Behavior: Behavior normal.     Outpatient Encounter Medications as of 12/29/2021  Medication Sig   levocetirizine (XYZAL) 5 MG tablet Take 1 tablet (5 mg total) by mouth daily.   aspirin 81 MG tablet Take 81 mg by mouth daily.   cetirizine (ZYRTEC) 5 MG tablet Take 5 mg by mouth daily as needed.    LIPITOR 40 MG tablet Take 1 tablet (40 mg total) by mouth daily.   meclizine (ANTIVERT) 12.5 MG tablet Take 2 tablets (25 mg total) by mouth 3 (three) times daily as needed for dizziness.   metoprolol tartrate (LOPRESSOR) 25 MG tablet Take 1 tablet (25 mg total) by mouth 2 (two) times daily.   ondansetron (ZOFRAN) 4 MG tablet Take 1 tablet (4 mg total) by mouth daily as needed for nausea or vomiting.   quinapril (ACCUPRIL) 40 MG tablet Take 1 tablet (40 mg total) by mouth daily.   triamcinolone (NASACORT) 55 MCG/ACT nasal inhaler Place 2 sprays into the nose daily as needed.    No facility-administered encounter medications on file as of 12/29/2021.     Lab Results  Component Value Date   WBC 6.8 10/29/2021   HGB 12.4 10/29/2021   HCT 37.8 10/29/2021   PLT 210 10/29/2021   GLUCOSE 137 (H) 12/29/2021   CHOL 140 10/27/2021   TRIG 94 10/27/2021   HDL 43 10/27/2021   LDLDIRECT 178.4 11/04/2013   LDLCALC 78 10/27/2021   ALT 26 10/26/2021   AST 36 10/26/2021   NA 139 12/29/2021   K 4.5 12/29/2021   CL 107 12/29/2021   CREATININE 1.32 (H) 12/29/2021   BUN 25 (H) 12/29/2021   CO2 21 12/29/2021   TSH 3.19 04/03/2021   HGBA1C 6.4 (H) 10/26/2021   MICROALBUR <0.7 04/05/2021    CT Head Wo Contrast  Result Date: 10/26/2021 CLINICAL DATA:  Dizziness. EXAM: CT HEAD WITHOUT CONTRAST TECHNIQUE: Contiguous axial images were obtained from the base of the skull through the vertex without intravenous contrast. COMPARISON:  None. FINDINGS: Brain: There is mild cerebral atrophy with widening of the  extra-axial spaces and ventricular dilatation. There are areas of decreased attenuation within the white matter tracts of the supratentorial brain, consistent with microvascular disease changes. Vascular: No hyperdense vessel or unexpected calcification. Skull: Normal. Negative for fracture or focal lesion. Sinuses/Orbits: No acute finding. Other: None. IMPRESSION: 1. Generalized cerebral atrophy. 2. No acute intracranial abnormality. Electronically Signed   By: Virgina Norfolk M.D.   On: 10/26/2021 00:09   MR BRAIN WO CONTRAST  Result Date: 10/26/2021 CLINICAL DATA:  88 year old female with vertigo, nausea and vomiting for 2 days. EXAM: MRI HEAD WITHOUT CONTRAST TECHNIQUE: Multiplanar, multiecho pulse sequences of the brain and surrounding structures were obtained without intravenous contrast. COMPARISON:  Head CT without contrast 2358 hours last night. Brain MRI 05/01/2021. FINDINGS: Brain: No restricted diffusion to suggest acute infarction. No midline shift, mass effect, evidence of mass lesion, ventriculomegaly, extra-axial collection or acute intracranial hemorrhage. Cervicomedullary junction and pituitary are within normal limits. Scattered and patchy but overall mild for age bilateral cerebral white matter T2 and FLAIR hyperintensity is stable since May. No cortical encephalomalacia identified. No definite chronic cerebral blood products. Deep gray matter nuclei, brainstem and cerebellum remain normal for age. Vascular: Major intracranial vascular flow voids are stable. Skull and upper cervical spine: Negative. Sinuses/Orbits: Stable, negative. Other: Mastoids remain clear. Stable and grossly normal visible internal auditory structures. Stylomastoid foramina and parotid glands appear normal. IMPRESSION: 1. No acute intracranial abnormality.  2. Stable since May and largely normal for age noncontrast MRI appearance of the brain. Electronically Signed   By: Genevie Ann M.D.   On: 10/26/2021 05:17        Assessment & Plan:   Problem List Items Addressed This Visit     Aortic atherosclerosis ([place])    Continue lipitor and aspirin.        Auditory hallucinations    Saw neurology.  MRI - unremarkable.  Discussed Charles Bonnett syndrome.  Follow.       B12 deficiency    Recheck B12 level with labs today.       Relevant Orders   Vitamin B12 (Completed)   CKD (chronic kidney disease), stage IIIa    GFR 44.  Avoid antiinflammatories.  Continue to stay hydrated.  Follow metabolic panel.       Elevated troponin    Noted while in the hospital.  Had recent evaluation by cardiology.  Recommended continuing statin, BB and aspirin.  No further w/up recommended.  Follow.       Environmental allergies    Stable - zyrtec/nasacort.  Follow.       Hypercholesterolemia    On lipitor.  Low cholesterol diet and exercise.  Follow lipid panel and liver function tests.       Hypertension - Primary    Blood pressure has been doing well on accupril and metoprolol. Follow pressures.  Follow metabolic panel.        Relevant Orders   Basic metabolic panel (Completed)   Stress    Increased stress as outlined.  Discussed.  Does not feel needs any further intervention.  Follow.       SVT (supraventricular tachycardia) ([place])    Followed by cardiology.  Continue lopressor.  Overall stable.  Follow.       Type 2 diabetes mellitus with hyperglycemia (HCC)    Low carb diet and exercise given elevated blood glucose. Follow met b and a1c.       Vertigo    Recently admitted with vertigo as outlined.  W/up as outlined. CT and MRI unrevealing.  No stroke, mass, etc.  PT evaluated and recommended vestibular rehab as outpt.  Also recommended ENT f/u.  Discussed with her again today regarding ENT evaluation and treatment.  She declines.  Is going to vestibular rehab.  Persistent symptoms, but does appear to be some better.  Continue PT.  Discussed slow position changes and movements.  Discussed neurology  evaluation.  She is in agreement.         I spent 45 minutes with the patient and more than 50% of the time was spent in consultation regarding the above.   Time spent discussing current symptoms.  Time also spent discussing plans for further evaluation and treatment.    Einar Pheasant, MD

## 2021-12-31 ENCOUNTER — Encounter: Payer: Self-pay | Admitting: Internal Medicine

## 2021-12-31 NOTE — Assessment & Plan Note (Signed)
On lipitor.  Low cholesterol diet and exercise.  Follow lipid panel and liver function tests.   

## 2021-12-31 NOTE — Assessment & Plan Note (Signed)
Stable - zyrtec/nasacort.  Follow.  

## 2021-12-31 NOTE — Assessment & Plan Note (Signed)
Recheck B12 level with labs today. 

## 2021-12-31 NOTE — Assessment & Plan Note (Signed)
Low carb diet and exercise given elevated blood glucose. Follow met b and a1c.  

## 2021-12-31 NOTE — Assessment & Plan Note (Signed)
Recently admitted with vertigo as outlined.  W/up as outlined. CT and MRI unrevealing.  No stroke, mass, etc.  PT evaluated and recommended vestibular rehab as outpt.  Also recommended ENT f/u.  Discussed with her again today regarding ENT evaluation and treatment.  She declines.  Is going to vestibular rehab.  Persistent symptoms, but does appear to be some better.  Continue PT.  Discussed slow position changes and movements.  Discussed neurology evaluation.  She is in agreement.

## 2021-12-31 NOTE — Assessment & Plan Note (Signed)
Followed by cardiology.  Continue lopressor.  Overall stable.  Follow.  

## 2021-12-31 NOTE — Assessment & Plan Note (Signed)
Continue lipitor and aspirin.  ?

## 2021-12-31 NOTE — Assessment & Plan Note (Signed)
Increased stress as outlined.  Discussed.  Does not feel needs any further intervention.  Follow.  

## 2021-12-31 NOTE — Assessment & Plan Note (Signed)
Noted while in the hospital.  Had recent evaluation by cardiology.  Recommended continuing statin, BB and aspirin.  No further w/up recommended.  Follow.

## 2021-12-31 NOTE — Assessment & Plan Note (Signed)
GFR 44.  Avoid antiinflammatories.  Continue to stay hydrated.  Follow metabolic panel.

## 2021-12-31 NOTE — Assessment & Plan Note (Signed)
Blood pressure has been doing well on accupril and metoprolol. Follow pressures.  Follow metabolic panel.

## 2021-12-31 NOTE — Assessment & Plan Note (Signed)
Saw neurology.  MRI - unremarkable.  Discussed Charles Bonnett syndrome.  Follow.

## 2022-01-02 ENCOUNTER — Ambulatory Visit: Payer: Medicare Other

## 2022-01-04 ENCOUNTER — Ambulatory Visit: Payer: Medicare Other

## 2022-01-05 ENCOUNTER — Other Ambulatory Visit: Payer: Self-pay

## 2022-01-05 ENCOUNTER — Ambulatory Visit: Payer: Medicare Other

## 2022-01-05 ENCOUNTER — Telehealth: Payer: Self-pay

## 2022-01-05 DIAGNOSIS — R2681 Unsteadiness on feet: Secondary | ICD-10-CM

## 2022-01-05 DIAGNOSIS — N1831 Chronic kidney disease, stage 3a: Secondary | ICD-10-CM

## 2022-01-05 DIAGNOSIS — M6281 Muscle weakness (generalized): Secondary | ICD-10-CM

## 2022-01-05 DIAGNOSIS — R42 Dizziness and giddiness: Secondary | ICD-10-CM

## 2022-01-05 NOTE — Telephone Encounter (Signed)
Placed orders for BMET for future labs

## 2022-01-05 NOTE — Therapy (Signed)
[facility] [address]. [place], [place], [address] Phone: [phone]   Fax:  [phone]  Physical Therapy Treatment  Patient Details  Name: Elizabeth [name] MRN: [id number] Date of Birth: [birth year] Referring Provider (PT): Einar Pheasant    Encounter Date: 01/05/2022   PT End of Session - 01/05/22 1142     Visit Number 8    Number of Visits 17    Date for PT Re-Evaluation 01/09/22    Authorization Type eval: 11/14/21, VL: based on [place]  Deductible: met  co-ins:20%  OOP:$3190/$2242.56 remains  no auth req    PT Start Time 1143    PT Stop Time 1228    PT Time Calculation (min) 45 min    Activity Tolerance Patient tolerated treatment well    Behavior During Therapy [facility] for tasks assessed/performed              Past Medical History:  Diagnosis Date   Cystocele or rectocele with incomplete uterine prolapse    Hyperlipidemia    Hypertension    Irritable bowel syndrome    Palpitations    Vaginal atrophy     Past Surgical History:  Procedure Laterality Date   CATARACT EXTRACTION  2006    There were no vitals filed for this visit.     Subjective Assessment - 01/05/22 1142     Subjective Pt reports that she is doing alright today besides having a minor sore throat. States that her L knee has been hurting after performing the sqauts within her HEP.    Pertinent History Elizabeth [name] is a 88 y.o. female with medical history significant of hypertension, hyperlipidemia, diet-controlled diabetes, GERD, vertigo, SVT, IBS, cystocele, CKD-3, who was recently admitted from 10/26/21-10/29/21 with vertigo.  Patient was also reporting history of diarrhea and having dry heaves.  On presentation lab work showed WBC count of 15.2, BNP of 521, lactic acid of 3.5.  CT head negative for intracranial normalities. MRI was negative for stroke.  Chest x-ray was negative for pneumonia.  Patient was admitted for further management. PT/OT assessed  her and recommended outpatient physical therapy follow-up for vestibular therapy. Symptoms never resolved however have improved since discharge. At this time she mostly reports feeling unsteady. "It feels like my head is heavy." She has a history of dizziness/vertigo for multiple years. Prior history of ENT evaluation years ago but pt doesn't recall any specific diagnosis however she does state that she has been diagnosed with labyrinthitis and Meniere's Disease in the past. History of bilateral hearing loss, worse on the right side. She had an appointment with Dr. Manuella Ghazi due to some auditory hallucinations but this was attributed to her hearing loss. Per pt and son she reports that all of her cognition is normal.  Lives alone and occasionally reports feeling lonely however denies being depressed. History of migraines marked by floaters but denies headache pain.    Limitations Walking    Diagnostic tests see history    Patient Stated Goals Decrease dizziness, improve balance               TREATMENT   Neuromuscular Re-education  NuStep L2 x 5 minutes for warm-up during interval history; Tandem balance alternating forward LE 2 x 30s each; Tandem gait in // bars x 10 lengths; VOR x 1 horizontal during forward and retro ambulation with object on plain wall 3 x 70' each direction; Forward/retro gait in hallway with vertical ball tosses to self with  head/eye follow 2 x 70' each direction; Forward/retro gait in hallway with horizontal ball passes with therapist with head/eye follow 2 x 70' forward/backward to the right and to the left; 180 degree quick turns with multiple bouts to the right and left followed by random patterns on command; Airex feet together balloon taps with therapist while student PT guards x 3 minutes;   Pt demonstrates excellent motivation during session today. She continues to report frustration with fluctuating dizziness however does report multiple days since last sesison  without symptoms. Continued with gaze stabilization exercises during session today as well as habituation ball tosses to therapist. Pt encouraged to follow-up as scheduled at which time goals will be updated to determine need for further therapy vs discharge.                PT Short Term Goals - 11/15/21 2203       PT SHORT TERM GOAL #1   Title Pt will be independent with HEP for dizziness, strength, and balance in order to decrease fall risk and improve function at home with less dizziness.    Time 4    Period Weeks    Status New    Target Date 12/12/21               PT Long Term Goals - 11/21/21 1725       PT LONG TERM GOAL #1   Title Pt will improve BERG by at least 3 points in order to demonstrate clinically significant improvement in balance.    Baseline 11/14/21: to be completed; 11/21/21: 43/56    Time 8    Period Weeks    Status New    Target Date 01/09/22      PT LONG TERM GOAL #2   Title Pt will increase FOTO to at least 60 in order to demonstrate significant improvement in function related to vertigo    Baseline 11/14/21: 50    Time 8    Period Weeks    Status New    Target Date 01/09/22      PT LONG TERM GOAL #3   Title Pt will improve ABC by at least 13% in order to demonstrate clinically significant improvement in balance confidence.    Baseline 11/14/21: to be completed; 11/21/21: 57.5%    Time 8    Period Weeks    Status New    Target Date 01/09/22      PT LONG TERM GOAL #4   Title Pt will improve DGI by at least 3 points in order to demonstrate clinically significant improvement in balance and decreased risk for falls    Baseline 11/21/21: 15/24    Time 8    Period Weeks    Status New    Target Date 01/09/22                   Plan - 01/05/22 1143     Clinical Impression Statement Pt demonstrates excellent motivation during session today. She continues to report frustration with fluctuating dizziness however does report  multiple days since last sesison without symptoms. Continued with gaze stabilization exercises during session today as well as habituation ball tosses to therapist. Pt encouraged to follow-up as scheduled at which time goals will be updated to determine need for further therapy vs discharge.    Personal Factors and Comorbidities Age;Comorbidity 3+;Past/Current Experience;Time since onset of injury/illness/exacerbation    Comorbidities DM, aortic atherosclersis, SVT, headache, vertigo    Examination-Activity Limitations Bend;Locomotion  Level;Stand;Transfers    Examination-Participation Restrictions Community Activity;Cleaning;Meal Prep    Stability/Clinical Decision Making Unstable/Unpredictable    Rehab Potential Fair    PT Frequency 2x / week    PT Duration 8 weeks    PT Treatment/Interventions ADLs/Self Care Home Management;Aquatic Therapy;Biofeedback;Canalith Repostioning;Cryotherapy;Electrical Stimulation;Iontophoresis 91m/ml Dexamethasone;Moist Heat;Traction;Ultrasound;DME Instruction;Gait training;Stair training;Functional mobility training;Therapeutic activities;Therapeutic exercise;Balance training;Neuromuscular re-education;Patient/family education;Manual techniques;Passive range of motion;Dry needling;Taping;Vestibular;Spinal Manipulations;Joint Manipulations;Visual/perceptual remediation/compensation;Cognitive remediation    PT Next Visit Plan Update outcome measures and goals, Review HEP, recertification vs discharge    PT Home Exercise Plan Access Code: [email or web address]    PZWCHENIDand Agree with Plan of Care Patient;Family member/caregiver    Family Member Consulted Son              Patient will benefit from skilled therapeutic intervention in order to improve the following deficits and impairments:  Decreased balance, Dizziness  Visit Diagnosis: Dizziness and giddiness  Unsteadiness on feet  Muscle weakness (generalized)     Problem List Patient Active Problem List    Diagnosis Date Noted   B12 deficiency 12/29/2021   Vertigo 10/26/2021   Elevated troponin 10/26/2021   Leukocytosis 10/26/2021   Elevated lactic acid level 10/26/2021   Nausea vomiting and diarrhea 10/26/2021   Aortic atherosclerosis ([place] 06/26/2021   Auditory hallucinations 04/10/2021   Right shoulder pain 10/02/2020   CKD (chronic kidney disease), stage IIIa 10/02/2020   Elevated TSH 10/02/2020   Weakness 05/29/2020   Type 2 diabetes mellitus with hyperglycemia ([place] 01/07/2020   GERD (gastroesophageal reflux disease) 11/09/2018   Dizziness 06/02/2017   Vaginal atrophy 09/27/2016   SVT (supraventricular tachycardia) ([place] 01/27/2016   Headache 01/26/2016   Left arm numbness 01/26/2016   Urethral caruncle 12/23/2015   Vaginal pessary in situ 09/22/2015   Health care maintenance 07/22/2015   Baden-Walker grade 4 cystocele 06/26/2015   First degree uterine prolapse 06/26/2015   Hyperkalemia 04/27/2015   Arrhythmia 04/27/2015   SOB (shortness of breath) 04/27/2015   Double vision 07/18/2014   Decreased hearing 07/18/2014   Stress 07/18/2014   Fatigue 07/18/2014   Environmental allergies 07/09/2013   Hypertension 12/09/2012   Hypercholesterolemia 12/09/2012   JLyndel SafeHuprich PT, DPT, GCS  Ailish Prospero, PT 01/05/2022, 4:58 PM  [facility] [place] [address]Phone: [phone]  Fax:  [phone] Name: CVaishali BaiseWard MRN: [id number] of Birth: [birth year]

## 2022-01-09 ENCOUNTER — Ambulatory Visit: Payer: Medicare Other

## 2022-01-11 ENCOUNTER — Ambulatory Visit: Payer: Medicare Other

## 2022-01-12 ENCOUNTER — Ambulatory Visit: Payer: Medicare Other

## 2022-01-12 ENCOUNTER — Other Ambulatory Visit: Payer: Self-pay

## 2022-01-12 DIAGNOSIS — R2681 Unsteadiness on feet: Secondary | ICD-10-CM

## 2022-01-12 DIAGNOSIS — R42 Dizziness and giddiness: Secondary | ICD-10-CM | POA: Diagnosis not present

## 2022-01-12 NOTE — Therapy (Signed)
[facility] [address] [place], [place], [address] Phone: [phone]   Fax:  [phone]  Physical Therapy Treatment/Discharge  Patient Details  Name: Elizabeth [name] MRN: [id number] Date of Birth: [birth year] Referring Provider (PT): Einar Pheasant    Encounter Date: 01/12/2022   PT End of Session - 01/12/22 1140     Visit Number 9    Number of Visits 18    Date for PT Re-Evaluation 01/19/22    Authorization Type eval: 11/14/21, VL: based on [place]  Deductible: met  co-ins:20%  OOP:$3190/$[id number] remains  no auth req    PT Start Time 1138    PT Stop Time 1223    PT Time Calculation (min) 45 min    Activity Tolerance Patient tolerated treatment well    Behavior During Therapy [facility] for tasks assessed/performed              Past Medical History:  Diagnosis Date   Cystocele or rectocele with incomplete uterine prolapse    Hyperlipidemia    Hypertension    Irritable bowel syndrome    Palpitations    Vaginal atrophy     Past Surgical History:  Procedure Laterality Date   CATARACT EXTRACTION  2006    There were no vitals filed for this visit.     Subjective Assessment - 01/12/22 1139     Subjective Pt reports that she is feeling very dizzy today. She had a good day on Wednesday and didn't have any dizziness all day. No specific questions upon arrival.    Pertinent History Elizabeth [name] is a 88 y.o. female with medical history significant of hypertension, hyperlipidemia, diet-controlled diabetes, GERD, vertigo, SVT, IBS, cystocele, CKD-3, who was recently admitted from 10/26/21-10/29/21 with vertigo.  Patient was also reporting history of diarrhea and having dry heaves.  On presentation lab work showed WBC count of 15.2, BNP of 521, lactic acid of 3.5.  CT head negative for intracranial normalities. MRI was negative for stroke.  Chest x-ray was negative for pneumonia.  Patient was admitted for further management. PT/OT  assessed her and recommended outpatient physical therapy follow-up for vestibular therapy. Symptoms never resolved however have improved since discharge. At this time she mostly reports feeling unsteady. "It feels like my head is heavy." She has a history of dizziness/vertigo for multiple years. Prior history of ENT evaluation years ago but pt doesn't recall any specific diagnosis however she does state that she has been diagnosed with labyrinthitis and Meniere's Disease in the past. History of bilateral hearing loss, worse on the right side. She had an appointment with Dr. Manuella Ghazi due to some auditory hallucinations but this was attributed to her hearing loss. Per pt and son she reports that all of her cognition is normal.  Lives alone and occasionally reports feeling lonely however denies being depressed. History of migraines marked by floaters but denies headache pain.    Limitations Walking    Diagnostic tests see history    Patient Stated Goals Decrease dizziness, improve balance    Currently in Pain? No/denies                [facility] PT Assessment - 01/12/22 1200       Standardized Balance Assessment   Standardized Balance Assessment Berg Balance Test;Dynamic Gait Index      Berg Balance Test   Sit to Stand Able to stand without using hands and stabilize independently    Standing Unsupported Able to stand safely  2 minutes    Sitting with Back Unsupported but Feet Supported on Floor or Stool Able to sit safely and securely 2 minutes    Stand to Sit Sits safely with minimal use of hands    Transfers Able to transfer safely, minor use of hands    Standing Unsupported with Eyes Closed Able to stand 10 seconds safely    Standing Unsupported with Feet Together Able to place feet together independently and stand 1 minute safely    From Standing, Reach Forward with Outstretched Arm Can reach confidently >25 cm (10")    From Standing Position, Pick up Object from Floor Able to pick up shoe safely  and easily    From Standing Position, Turn to Look Behind Over each Shoulder Looks behind from both sides and weight shifts well    Turn 360 Degrees Needs close supervision or verbal cueing    Standing Unsupported, Alternately Place Feet on Step/Stool Able to stand independently and safely and complete 8 steps in 20 seconds    Standing Unsupported, One Foot in Front Able to plae foot ahead of the other independently and hold 30 seconds    Standing on One Leg Able to lift leg independently and hold equal to or more than 3 seconds    Total Score 50      Dynamic Gait Index   Level Surface Normal    Change in Gait Speed Normal    Gait with Horizontal Head Turns Mild Impairment    Gait with Vertical Head Turns Mild Impairment    Gait and Pivot Turn Normal    Step Over Obstacle Normal    Step Around Obstacles Normal    Steps Mild Impairment    Total Score 21              TREATMENT   Neuromuscular Re-education  Updated outcome measures with patient (see findings below):  FUNCTIONAL OUTCOME MEASURES   11/14/21 11/21/21 01/12/22 Comments  BERG NT 43/56 50/56 Mild fall risk  DGI NT 15/24 21/24 Mild fall risk  FOTO 50 NT 54 Predicted improvement to 60  TUG NT 10.7 seconds 7.5s WNL  5TSTS NT 9.5 seconds 10.1s WNL  10 Meter Gait Speed NT Self-selected: 8s = 1.25 m/s; Self-selected: 7.8s = 1.28 m/s; WNL  ABC Scale NT 57.5% 70% Above cut-off  DHI NT 40/100 32/100 Mild perception of handicap related to dizziness    VOR x 1 horizontal in standing with feet together and target on plain wall at arms length 3 x 60s, pt denies dizziness; Feet apart eyes closed x 30s; Feet together eyes closed x 30s; Half-tandem balance alternating forward LE x 30s each; Updated HEP issued and reviewed;   Updated outcome measures with patient during session today. Her TUG, 30mgait speed, ABC, BERG, DGI, and DHI all improved. She met most of her goals for therapy however she continues to report  frustration with intermittent dizziness. Updated HEP with patient during session and emphasized the importance for patient to continue exercises after discharge. Pt will be discharged on this date with instructions to return if symptoms worsen.           PT Education - 01/12/22 1318     Education Details HEP and discharge    Person(s) Educated Patient    Methods Explanation;Demonstration;Handout    Comprehension Verbalized understanding;Returned demonstration               PT Short Term Goals - 01/12/22 1141  PT SHORT TERM GOAL #1   Title Pt will be independent with HEP for dizziness, strength, and balance in order to decrease fall risk and improve function at home with less dizziness.    Time 4    Period Weeks    Status On-going               PT Long Term Goals - 01/12/22 1141       PT LONG TERM GOAL #1   Title Pt will improve BERG by at least 3 points in order to demonstrate clinically significant improvement in balance.    Baseline 11/14/21: to be completed; 11/21/21: 43/56; 01/12/22: 50/56    Time 8    Period Weeks    Status Achieved    Target Date --      PT LONG TERM GOAL #2   Title Pt will increase FOTO to at least 60 in order to demonstrate significant improvement in function related to vertigo    Baseline 11/14/21: 50; 01/12/22: 54    Time 8    Period Weeks    Status Partially Met    Target Date --      PT LONG TERM GOAL #3   Title Pt will improve ABC by at least 13% in order to demonstrate clinically significant improvement in balance confidence.    Baseline 11/14/21: to be completed; 11/21/21: 57.5%: 01/12/22: 70%    Time 8    Period Weeks    Status Achieved    Target Date 01/09/22      PT LONG TERM GOAL #4   Title Pt will improve DGI by at least 3 points in order to demonstrate clinically significant improvement in balance and decreased risk for falls    Baseline 11/21/21: 15/24: 01/12/22: 21/24    Time 8    Period Weeks    Status  Achieved    Target Date 01/09/22                   Plan - 01/12/22 1141     Clinical Impression Statement Updated outcome measures with patient during session today. Her TUG, 64mgait speed, ABC, BERG, DGI, and DHI all improved. She met most of her goals for therapy however she continues to report frustration with intermittent dizziness. Updated HEP with patient during session and emphasized the importance for patient to continue exercises after discharge. Pt will be discharged on this date with instructions to return if symptoms worsen.    Personal Factors and Comorbidities Age;Comorbidity 3+;Past/Current Experience;Time since onset of injury/illness/exacerbation    Comorbidities DM, aortic atherosclersis, SVT, headache, vertigo    Examination-Activity Limitations Bend;Locomotion Level;Stand;Transfers    Examination-Participation Restrictions Community Activity;Cleaning;Meal Prep    Stability/Clinical Decision Making Unstable/Unpredictable    Rehab Potential Fair    PT Frequency One time visit    PT Duration --   1 week   PT Treatment/Interventions ADLs/Self Care Home Management;Aquatic Therapy;Biofeedback;Canalith Repostioning;Cryotherapy;Electrical Stimulation;Iontophoresis 476mml Dexamethasone;Moist Heat;Traction;Ultrasound;DME Instruction;Gait training;Stair training;Functional mobility training;Therapeutic activities;Therapeutic exercise;Balance training;Neuromuscular re-education;Patient/family education;Manual techniques;Passive range of motion;Dry needling;Taping;Vestibular;Spinal Manipulations;Joint Manipulations;Visual/perceptual remediation/compensation;Cognitive remediation    PT Next Visit Plan Discharge    PT Home Exercise Plan Access Code: [email or web address]    [email or web address] Agree with Plan of Care Patient;Family member/caregiver    Family Member Consulted Son              Patient will benefit from skilled therapeutic intervention in order to improve the following deficits  and impairments:  Decreased balance, Dizziness  Visit Diagnosis:  Dizziness and giddiness - Plan: PT plan of care cert/re-cert  Unsteadiness on feet - Plan: PT plan of care cert/re-cert     Problem List Patient Active Problem List   Diagnosis Date Noted   B12 deficiency 12/29/2021   Vertigo 10/26/2021   Elevated troponin 10/26/2021   Leukocytosis 10/26/2021   Elevated lactic acid level 10/26/2021   Nausea vomiting and diarrhea 10/26/2021   Aortic atherosclerosis ([place]) 06/26/2021   Auditory hallucinations 04/10/2021   Right shoulder pain 10/02/2020   CKD (chronic kidney disease), stage IIIa 10/02/2020   Elevated TSH 10/02/2020   Weakness 05/29/2020   Type 2 diabetes mellitus with hyperglycemia ([place]) 01/07/2020   GERD (gastroesophageal reflux disease) 11/09/2018   Dizziness 06/02/2017   Vaginal atrophy 09/27/2016   SVT (supraventricular tachycardia) ([place]) 01/27/2016   Headache 01/26/2016   Left arm numbness 01/26/2016   Urethral caruncle 12/23/2015   Vaginal pessary in situ 09/22/2015   Health care maintenance 07/22/2015   Baden-Walker grade 4 cystocele 06/26/2015   First degree uterine prolapse 06/26/2015   Hyperkalemia 04/27/2015   Arrhythmia 04/27/2015   SOB (shortness of breath) 04/27/2015   Double vision 07/18/2014   Decreased hearing 07/18/2014   Stress 07/18/2014   Fatigue 07/18/2014   Environmental allergies 07/09/2013   Hypertension 12/09/2012   Hypercholesterolemia 12/09/2012   Elizabeth [name] PT, DPT, GCS  Elizabeth [name], PT 01/12/2022, 1:33 PM  [facility] [address] [place], [place], [address] Phone: [phone]   Fax:  [phone]  Name: Elizabeth [name] MRN: [id number] Date of Birth: [birth year]

## 2022-01-12 NOTE — Patient Instructions (Signed)
Access Code: [email or web address] URL: [email or web address] Date: 01/12/2022 Prepared by: Roxana Hires  Exercises Standing Gaze Stabilization with Head Rotation - 3 x daily - 7 x weekly - 3 reps - 60 seconds hold Wide Stance with Eyes Closed - 1 x daily - 7 x weekly - 3 reps - 30s hold Standing Romberg to 1/2 Tandem Stance - 1 x daily - 7 x weekly - 3 x 30s with each foot forward hold Sit to Stand Without Arm Support - 1 x daily - 7 x weekly - 2 sets - 10 reps

## 2022-01-15 ENCOUNTER — Other Ambulatory Visit: Payer: Self-pay | Admitting: Internal Medicine

## 2022-01-16 ENCOUNTER — Ambulatory Visit: Payer: Medicare Other

## 2022-01-18 ENCOUNTER — Ambulatory Visit: Payer: Medicare Other

## 2022-01-22 ENCOUNTER — Other Ambulatory Visit (INDEPENDENT_AMBULATORY_CARE_PROVIDER_SITE_OTHER): Payer: Medicare Other

## 2022-01-22 ENCOUNTER — Other Ambulatory Visit: Payer: Self-pay

## 2022-01-22 DIAGNOSIS — N1831 Chronic kidney disease, stage 3a: Secondary | ICD-10-CM | POA: Diagnosis not present

## 2022-01-22 LAB — BASIC METABOLIC PANEL
BUN: 32 mg/dL — ABNORMAL HIGH (ref 6–23)
CO2: 22 mEq/L (ref 19–32)
Calcium: 9.4 mg/dL (ref 8.4–10.5)
Chloride: 102 mEq/L (ref 96–112)
Creatinine, Ser: 1.28 mg/dL — ABNORMAL HIGH (ref 0.40–1.20)
GFR: 36.25 mL/min — ABNORMAL LOW (ref >60.00)
Glucose, Bld: 94 mg/dL (ref 70–99)
Potassium: 4.7 mEq/L (ref 3.5–5.1)
Sodium: 135 mEq/L (ref 135–145)

## 2022-02-08 ENCOUNTER — Telehealth: Payer: Self-pay | Admitting: Cardiovascular Disease

## 2022-02-08 MED ORDER — METOPROLOL TARTRATE 25 MG PO TABS: 25.0000 mg | ORAL_TABLET | Freq: Two times a day (BID) | ORAL | 0 refills | Status: DC

## 2022-02-08 NOTE — Telephone Encounter (Signed)
°*  STAT* If patient is at the pharmacy, call can be transferred to refill team.   1. Which medications need to be refilled? (please list name of each medication and dose if known) metoprolol 25 mg po BID  2. Which pharmacy/location (including street and city if local pharmacy) is medication to be sent to?[facility]   3. Do they need a 30 day or 90 day supply? 90

## 2022-02-08 NOTE — Telephone Encounter (Signed)
Requested Prescriptions   Signed Prescriptions Disp Refills   metoprolol tartrate (LOPRESSOR) 25 MG tablet 180 tablet 0    Sig: Take 1 tablet (25 mg total) by mouth 2 (two) times daily.    Authorizing Provider: GOLLAN, TIMOTHY J    Ordering User: NEWCOMER MCCLAIN, Aneliese Beaudry L    

## 2022-02-15 NOTE — Progress Notes (Signed)
Cardiology Office Note  Date:  02/16/2022   ID:  Elizabeth [name], DOB Jun 09, 1927, MRN [id number]  PCP:  Einar Pheasant, MD   Chief Complaint  Patient presents with   3 month follow up     "Doing well." Medications reviewed by the patient verbally.     HPI:  [name] is an 88 year old woman with history of  hypertension,  hyperlipidemia  Rare episodes of near syncope, no arrhythmia on monitor who presents for  Follow-up of her arrhythmia, lightheadedness, Hypertension Chronic atypical chest sensation, palpitations Previously worked up with zio monitor  LOV 8/22 Trouble with vertigo/menieres In hospital 11/22 CT head negative for intracranial normalities.  MRI was negative for stroke. TNT low, stable  Hearing aides Hearing voices, better Followed by Dr. Manuella Ghazi  Active, walks "Fat ankles"  Labs reviewed CR 1.28 BUN 32  Completed PT Adjusting to loss of husband several years ago Lives independently alone in an apartment block  Echocardiogram 01/19/21 with LVEF 0000000, grade 1 diastolic dysfunction, no RWMA, normal PASP, mild MR, mild AI   EKG personally reviewed by myself on todays visit NSR rate 70 bpm LAD  emergency room August 2020 for chest pain, ruled out, felt to be atypical in nature  On last clinic visit November 2020 was walking on the treadmill Continued medication noncompliance Continued problems with anxiety  ZIO monitor with rare short runs of tachycardia SVT/atrial tach longest 12 seconds Average heart rate 64 bpm No change in monitor compares to 11/2018  Labs reviewed Total chol 151, LDL 81  previous episode of severe tachycardia , persisted longer than other episodes ,  Seem to go away on its own without intervention.  She does have significant stress at home, taking care of her husband. She is the primary caretaker    long history of short runs of palpitations, she has had these for 20 years  previously was using the treadmill 4-5 times per  week She tried a lower dose of benazepril, reported her systolic pressure was [id number] in the evenings on 20 mg daily, went back to 40 mg daily   PMH:   has a past medical history of Cystocele or rectocele with incomplete uterine prolapse, Hyperlipidemia, Hypertension, Irritable bowel syndrome, Palpitations, and Vaginal atrophy.  PSH:    Past Surgical History:  Procedure Laterality Date   CATARACT EXTRACTION  2006    Current Outpatient Medications  Medication Sig Dispense Refill   aspirin 81 MG tablet Take 81 mg by mouth daily.     cetirizine (ZYRTEC) 5 MG tablet Take 5 mg by mouth daily as needed.      levocetirizine (XYZAL) 5 MG tablet Take 1 tablet (5 mg total) by mouth daily. 30 tablet 1   LIPITOR 40 MG tablet TAKE 1 TABLET BY MOUTH ONCE DAILY 90 tablet 3   meclizine (ANTIVERT) 12.5 MG tablet Take 2 tablets (25 mg total) by mouth 3 (three) times daily as needed for dizziness. 30 tablet 0   metoprolol tartrate (LOPRESSOR) 25 MG tablet Take 1 tablet (25 mg total) by mouth 2 (two) times daily. 180 tablet 0   ondansetron (ZOFRAN) 4 MG tablet Take 1 tablet (4 mg total) by mouth daily as needed for nausea or vomiting. 30 tablet 1   quinapril (ACCUPRIL) 40 MG tablet Take 1 tablet (40 mg total) by mouth daily. 90 tablet 3   triamcinolone (NASACORT) 55 MCG/ACT nasal inhaler Place 2 sprays into the nose daily as needed.      No  current facility-administered medications for this visit.     Allergies:   Patient has no known allergies.   Social History:  The patient  reports that she has never smoked. She has never used smokeless tobacco. She reports that she does not drink alcohol and does not use drugs.   Family History:   family history includes Dementia in her mother and sister; Heart disease in her brother and sister; Leukemia in her brother; Lung cancer in her brother; Stroke in her father; Throat cancer in her brother; Transient ischemic attack in her mother.    Review of Systems: Review  of Systems  Constitutional: Negative.   HENT: Negative.    Respiratory: Negative.    Cardiovascular: Negative.   Gastrointestinal: Negative.   Musculoskeletal: Negative.   Neurological: Negative.   Psychiatric/Behavioral: Negative.    All other systems reviewed and are negative.  PHYSICAL EXAM: VS:  BP (!) 157/72 (BP Location: Left Arm, Patient Position: Sitting, Cuff Size: Normal)    Pulse 70    Ht 5\' 1"  (1.549 m)    Wt 146 lb (66.2 kg)    SpO2 98%    BMI 27.59 kg/m  , BMI Body mass index is 27.59 kg/m. Constitutional:  oriented to person, place, and time. No distress.  HENT:  Head: Grossly normal Eyes:  no discharge. No scleral icterus.  Neck: No JVD, no carotid bruits  Cardiovascular: Regular rate and rhythm, no murmurs appreciated Pulmonary/Chest: Clear to auscultation bilaterally, no wheezes or rails Abdominal: Soft.  no distension.  no tenderness.  Musculoskeletal: Normal range of motion Neurological:  normal muscle tone. Coordination normal. No atrophy Skin: Skin warm and dry Psychiatric: normal affect, pleasant  Recent Labs: 04/03/2021: TSH 3.19 10/26/2021: ALT 26; B Natriuretic Peptide 521.6 10/29/2021: Hemoglobin 12.4; Platelets 210 01/22/2022: BUN 32; Creatinine, Ser 1.28; Potassium 4.7; Sodium 135    Lipid Panel Lab Results  Component Value Date   CHOL 140 10/27/2021   HDL 43 10/27/2021   LDLCALC 78 10/27/2021   TRIG 94 10/27/2021      Wt Readings from Last 3 Encounters:  02/16/22 146 lb (66.2 kg)  12/29/21 144 lb (65.3 kg)  11/10/21 140 lb (63.5 kg)     ASSESSMENT AND PLAN:  Cardiac arrhythmia, unspecified cardiac arrhythmia type  Rare shorts runs of SVT/atrial tach on monitor  Minimal symptoms No med changes  Essential hypertension -  BP elevated, Suggest she monitor BP at home and call with numbers  Hypercholesterolemia Cholesterol is at goal on the current lipid regimen. No changes to the medications were made.  Stress Significant anxiety,  stress Managed by PMD   Total encounter time more than 30 minutes  Greater than 50% was spent in counseling and coordination of care with the patient   Orders Placed This Encounter  Procedures   EKG 12-Lead      Signed, Esmond Plants, M.D., Ph.D. 02/16/2022  [place], [place]

## 2022-02-16 ENCOUNTER — Other Ambulatory Visit: Payer: Self-pay

## 2022-02-16 ENCOUNTER — Ambulatory Visit (INDEPENDENT_AMBULATORY_CARE_PROVIDER_SITE_OTHER): Payer: Medicare Other | Admitting: Cardiovascular Disease

## 2022-02-16 ENCOUNTER — Encounter: Payer: Self-pay | Admitting: Cardiovascular Disease

## 2022-02-16 VITALS — BP 157/72 | HR 70 | Ht 61.0 in | Wt 146.0 lb

## 2022-02-16 DIAGNOSIS — I5032 Chronic diastolic (congestive) heart failure: Secondary | ICD-10-CM | POA: Diagnosis not present

## 2022-02-16 DIAGNOSIS — I10 Essential (primary) hypertension: Secondary | ICD-10-CM | POA: Diagnosis not present

## 2022-02-16 DIAGNOSIS — I700 Atherosclerosis of aorta: Secondary | ICD-10-CM

## 2022-02-16 DIAGNOSIS — I471 Supraventricular tachycardia: Secondary | ICD-10-CM

## 2022-02-16 DIAGNOSIS — I872 Venous insufficiency (chronic) (peripheral): Secondary | ICD-10-CM

## 2022-02-16 DIAGNOSIS — E782 Mixed hyperlipidemia: Secondary | ICD-10-CM

## 2022-02-16 NOTE — Patient Instructions (Signed)
Medication Instructions:  No changes  If you need a refill on your cardiac medications before your next appointment, please call your pharmacy.   Lab work: No new labs needed  Testing/Procedures: No new testing needed  Follow-Up: At [facility], you and your health needs are our priority.  As part of our continuing mission to provide you with exceptional heart care, we have created designated Provider Care Teams.  These Care Teams include your primary Cardiologist (physician) and Advanced Practice Providers (APPs -  Physician Assistants and Nurse Practitioners) who all work together to provide you with the care you need, when you need it.  You will need a follow up appointment in 12 months  Providers on your designated Care Team:   Christopher Berge, NP Ryan Dunn, PA-C Cadence Furth, PA-C  COVID-19 Vaccine Information can be found at: [email or web address] For questions related to vaccine distribution or appointments, please email [email or web address] or call [phone].   

## 2022-02-26 ENCOUNTER — Ambulatory Visit: Payer: Medicare Other | Admitting: Internal Medicine

## 2022-02-26 ENCOUNTER — Other Ambulatory Visit: Payer: Self-pay

## 2022-02-26 DIAGNOSIS — E1165 Type 2 diabetes mellitus with hyperglycemia: Secondary | ICD-10-CM

## 2022-02-26 DIAGNOSIS — I471 Supraventricular tachycardia: Secondary | ICD-10-CM

## 2022-02-26 DIAGNOSIS — I700 Atherosclerosis of aorta: Secondary | ICD-10-CM | POA: Diagnosis not present

## 2022-02-26 DIAGNOSIS — I10 Essential (primary) hypertension: Secondary | ICD-10-CM | POA: Diagnosis not present

## 2022-02-26 DIAGNOSIS — R519 Headache, unspecified: Secondary | ICD-10-CM

## 2022-02-26 DIAGNOSIS — F439 Reaction to severe stress, unspecified: Secondary | ICD-10-CM

## 2022-02-26 DIAGNOSIS — R42 Dizziness and giddiness: Secondary | ICD-10-CM

## 2022-02-26 DIAGNOSIS — E538 Deficiency of other specified B group vitamins: Secondary | ICD-10-CM

## 2022-02-26 DIAGNOSIS — E7800 Pure hypercholesterolemia, unspecified: Secondary | ICD-10-CM

## 2022-02-26 DIAGNOSIS — N1831 Chronic kidney disease, stage 3a: Secondary | ICD-10-CM

## 2022-02-26 NOTE — Progress Notes (Signed)
Patient ID: Elizabeth [name], female   DOB: 1927/11/05, 88 y.o.   MRN: [id number]   Subjective:    Patient ID: Elizabeth [name], female    DOB: 01/10/1927, 88 y.o.   MRN: [id number]  This visit occurred during the SARS-CoV-2 public health emergency.  Safety protocols were in place, including screening questions prior to the visit, additional usage of staff PPE, and extensive cleaning of exam room while observing appropriate contact time as indicated for disinfecting solutions.   Patient here for a scheduled follow up.   Chief Complaint  Patient presents with   Dizziness   .   HPI She is accompanied by her daughter.  History obtained from both of them.  Persistent problems with vertigo and imbalance with negative evaluation for BPV.  Seeing neurology.  Concern regarding possible vestibular migraines.  Recommended continuing vestibular exercises at home.  MRI unrevealing.  Did recommend magnesium supplements.  Her imbalance and light headedness - varies throughout the day. Has not fallen.  No chest pain.  Breathing stable.  Eating.  No nausea or vomiting.  Bowels stable.    Past Medical History:  Diagnosis Date   Cystocele or rectocele with incomplete uterine prolapse    Hyperlipidemia    Hypertension    Irritable bowel syndrome    Palpitations    Vaginal atrophy    Past Surgical History:  Procedure Laterality Date   CATARACT EXTRACTION  2006   Family History  Problem Relation Age of Onset   Transient ischemic attack Mother        multiple    Dementia Mother    Stroke Father    Heart disease Brother        MI - died age 18   Leukemia Brother    Lung cancer Brother    Throat cancer Brother        had heart disease also   Heart disease Sister    Dementia Sister    Social History   Socioeconomic History   Marital status: Widowed    Spouse name: Not on file   Number of children: 2   Years of education: Not on file   Highest education level: Not on file  Occupational History    Not on file  Tobacco Use   Smoking status: Never   Smokeless tobacco: Never  Vaping Use   Vaping Use: Never used  Substance and Sexual Activity   Alcohol use: No    Alcohol/week: 0.0 standard drinks   Drug use: No   Sexual activity: Not Currently    Birth control/protection: None  Other Topics Concern   Not on file  Social History Narrative   Not on file   Social Determinants of Health   Financial Resource Strain: Low Risk    Difficulty of Paying Living Expenses: Not hard at all  Food Insecurity: No Food Insecurity   Worried About Charity fundraiser in the Last Year: Never true   [place] in the Last Year: Never true  Transportation Needs: No Transportation Needs   Lack of Transportation (Medical): No   Lack of Transportation (Non-Medical): No  Physical Activity: Insufficiently Active   Days of Exercise per Week: 7 days   Minutes of Exercise per Session: 20 min  Stress: No Stress Concern Present   Feeling of Stress : Not at all  Social Connections: Unknown   Frequency of Communication with Friends and Family: More than three times a week   Frequency of  Social Gatherings with Friends and Family: Not on file   Attends Religious Services: Not on file   Active Member of Clubs or Organizations: Not on file   Attends Archivist Meetings: Not on file   Marital Status: Widowed     Review of Systems  Constitutional:  Negative for appetite change, fever and unexpected weight change.  HENT:  Negative for congestion and sinus pressure.   Respiratory:  Negative for cough, chest tightness and shortness of breath.   Cardiovascular:  Negative for chest pain, palpitations and leg swelling.  Gastrointestinal:  Negative for abdominal pain, diarrhea, nausea and vomiting.  Genitourinary:  Negative for difficulty urinating and dysuria.  Musculoskeletal:  Negative for joint swelling and myalgias.  Skin:  Negative for color change and rash.  Neurological:         Dizziness and unsteadiness as outlined.  No headache.   Psychiatric/Behavioral:  Negative for agitation and dysphoric mood.       Objective:     BP 120/70    Pulse 70    Temp 97.6 F (36.4 C)    Resp 16    Ht '5\' 1"'  (1.549 m)    Wt 147 lb (66.7 kg)    SpO2 98%    BMI 27.78 kg/m  Wt Readings from Last 3 Encounters:  02/26/22 147 lb (66.7 kg)  02/16/22 146 lb (66.2 kg)  12/29/21 144 lb (65.3 kg)    Physical Exam Vitals reviewed.  Constitutional:      General: She is not in acute distress.    Appearance: Normal appearance.  HENT:     Head: Normocephalic and atraumatic.     Right Ear: External ear normal.     Left Ear: External ear normal.  Eyes:     General: No scleral icterus.       Right eye: No discharge.        Left eye: No discharge.     Conjunctiva/sclera: Conjunctivae normal.  Neck:     Thyroid: No thyromegaly.  Cardiovascular:     Rate and Rhythm: Normal rate and regular rhythm.  Pulmonary:     Effort: No respiratory distress.     Breath sounds: Normal breath sounds. No wheezing.  Abdominal:     General: Bowel sounds are normal.     Palpations: Abdomen is soft.     Tenderness: There is no abdominal tenderness.  Musculoskeletal:        General: No swelling or tenderness.     Cervical back: Neck supple. No tenderness.  Lymphadenopathy:     Cervical: No cervical adenopathy.  Skin:    Findings: No erythema or rash.  Neurological:     Mental Status: She is alert.  Psychiatric:        Mood and Affect: Mood normal.        Behavior: Behavior normal.     Outpatient Encounter Medications as of 02/26/2022  Medication Sig   aspirin 81 MG tablet Take 81 mg by mouth daily.   cetirizine (ZYRTEC) 5 MG tablet Take 5 mg by mouth daily as needed.    levocetirizine (XYZAL) 5 MG tablet Take 1 tablet (5 mg total) by mouth daily.   LIPITOR 40 MG tablet TAKE 1 TABLET BY MOUTH ONCE DAILY   meclizine (ANTIVERT) 12.5 MG tablet Take 2 tablets (25 mg total) by mouth 3 (three) times  daily as needed for dizziness.   metoprolol tartrate (LOPRESSOR) 25 MG tablet Take 1 tablet (25 mg total) by mouth  2 (two) times daily.   ondansetron (ZOFRAN) 4 MG tablet Take 1 tablet (4 mg total) by mouth daily as needed for nausea or vomiting.   quinapril (ACCUPRIL) 40 MG tablet Take 1 tablet (40 mg total) by mouth daily.   triamcinolone (NASACORT) 55 MCG/ACT nasal inhaler Place 2 sprays into the nose daily as needed.    No facility-administered encounter medications on file as of 02/26/2022.     Lab Results  Component Value Date   WBC 6.8 10/29/2021   HGB 12.4 10/29/2021   HCT 37.8 10/29/2021   PLT 210 10/29/2021   GLUCOSE 94 01/22/2022   CHOL 140 10/27/2021   TRIG 94 10/27/2021   HDL 43 10/27/2021   LDLDIRECT 178.4 11/04/2013   LDLCALC 78 10/27/2021   ALT 26 10/26/2021   AST 36 10/26/2021   NA 135 01/22/2022   K 4.7 01/22/2022   CL 102 01/22/2022   CREATININE 1.28 (H) 01/22/2022   BUN 32 (H) 01/22/2022   CO2 22 01/22/2022   TSH 3.19 04/03/2021   HGBA1C 6.4 (H) 10/26/2021   MICROALBUR <0.7 04/05/2021    CT Head Wo Contrast  Result Date: 10/26/2021 CLINICAL DATA:  Dizziness. EXAM: CT HEAD WITHOUT CONTRAST TECHNIQUE: Contiguous axial images were obtained from the base of the skull through the vertex without intravenous contrast. COMPARISON:  None. FINDINGS: Brain: There is mild cerebral atrophy with widening of the extra-axial spaces and ventricular dilatation. There are areas of decreased attenuation within the white matter tracts of the supratentorial brain, consistent with microvascular disease changes. Vascular: No hyperdense vessel or unexpected calcification. Skull: Normal. Negative for fracture or focal lesion. Sinuses/Orbits: No acute finding. Other: None. IMPRESSION: 1. Generalized cerebral atrophy. 2. No acute intracranial abnormality. Electronically Signed   By: Virgina Norfolk M.D.   On: 10/26/2021 00:09   MR BRAIN WO CONTRAST  Result Date: 10/26/2021 CLINICAL  DATA:  88 year old female with vertigo, nausea and vomiting for 2 days. EXAM: MRI HEAD WITHOUT CONTRAST TECHNIQUE: Multiplanar, multiecho pulse sequences of the brain and surrounding structures were obtained without intravenous contrast. COMPARISON:  Head CT without contrast 2358 hours last night. Brain MRI 05/01/2021. FINDINGS: Brain: No restricted diffusion to suggest acute infarction. No midline shift, mass effect, evidence of mass lesion, ventriculomegaly, extra-axial collection or acute intracranial hemorrhage. Cervicomedullary junction and pituitary are within normal limits. Scattered and patchy but overall mild for age bilateral cerebral white matter T2 and FLAIR hyperintensity is stable since May. No cortical encephalomalacia identified. No definite chronic cerebral blood products. Deep gray matter nuclei, brainstem and cerebellum remain normal for age. Vascular: Major intracranial vascular flow voids are stable. Skull and upper cervical spine: Negative. Sinuses/Orbits: Stable, negative. Other: Mastoids remain clear. Stable and grossly normal visible internal auditory structures. Stylomastoid foramina and parotid glands appear normal. IMPRESSION: 1. No acute intracranial abnormality. 2. Stable since May and largely normal for age noncontrast MRI appearance of the brain. Electronically Signed   By: Genevie Ann M.D.   On: 10/26/2021 05:17       Assessment & Plan:   Problem List Items Addressed This Visit     Aortic atherosclerosis ([place])    Continue lipitor and aspirin.        B12 deficiency    Continue supplements.       CKD (chronic kidney disease) stage 3, GFR 30-59 ml/min (HCC)     Avoid antiinflammatories.  Continue to stay hydrated.  Follow metabolic panel.       Headache  Seeing Dr Manuella Ghazi.  Started on magnesium.       Hypercholesterolemia    On lipitor.  Low cholesterol diet and exercise.  Follow lipid panel and liver function tests.       Relevant Orders   Hepatic function panel    Lipid panel   TSH   Hypertension    Blood pressure has been doing well on accupril and metoprolol. Follow pressures.  Follow metabolic panel.        Stress    Increased stress related to current medical issues.  Discussed.  Does not feel needs any further intervention.  Follow.       SVT (supraventricular tachycardia) ([place])    Followed by cardiology.  Continue lopressor.  Overall stable.  Follow.       Type 2 diabetes mellitus with hyperglycemia (HCC)    Low carb diet and exercise given elevated blood glucose. Follow met b and a1c.       Relevant Orders   Hemoglobin H6P   Basic metabolic panel   Vertigo    Recently admitted with vertigo as outlined.  W/up as outlined. CT and MRI unrevealing.  No stroke, mass, etc.  PT evaluated and recommended vestibular rehab as outpt. Continue exercises.   Discussed slow position changes and movements.  Seeing neurology.  Started on magnesium.  She is doing better.  Follow.         Einar Pheasant, MD

## 2022-03-05 ENCOUNTER — Encounter: Payer: Self-pay | Admitting: Internal Medicine

## 2022-03-05 NOTE — Assessment & Plan Note (Signed)
Continue lipitor and aspirin.  ?

## 2022-03-05 NOTE — Assessment & Plan Note (Signed)
Recently admitted with vertigo as outlined.  W/up as outlined. CT and MRI unrevealing.  No stroke, mass, etc.  PT evaluated and recommended vestibular rehab as outpt. Continue exercises.   Discussed slow position changes and movements.  Seeing neurology.  Started on magnesium.  She is doing better.  Follow.  ?

## 2022-03-05 NOTE — Assessment & Plan Note (Signed)
Seeing Dr Sherryll Burger.  Started on magnesium.  ?

## 2022-03-05 NOTE — Assessment & Plan Note (Signed)
Low carb diet and exercise given elevated blood glucose. Follow met b and a1c.  

## 2022-03-05 NOTE — Assessment & Plan Note (Signed)
Continue supplements

## 2022-03-05 NOTE — Assessment & Plan Note (Signed)
Increased stress related to current medical issues.  Discussed.  Does not feel needs any further intervention.  Follow.  

## 2022-03-05 NOTE — Assessment & Plan Note (Signed)
Avoid antiinflammatories.  Continue to stay hydrated.  Follow metabolic panel.  

## 2022-03-05 NOTE — Assessment & Plan Note (Signed)
Followed by cardiology.  Continue lopressor.  Overall stable.  Follow.  

## 2022-03-05 NOTE — Assessment & Plan Note (Signed)
On lipitor.  Low cholesterol diet and exercise.  Follow lipid panel and liver function tests.   

## 2022-03-05 NOTE — Assessment & Plan Note (Signed)
Blood pressure has been doing well on accupril and metoprolol. Follow pressures.  Follow metabolic panel.   ?

## 2022-03-26 ENCOUNTER — Ambulatory Visit: Payer: Medicare Other | Admitting: Internal Medicine

## 2022-03-27 ENCOUNTER — Other Ambulatory Visit (INDEPENDENT_AMBULATORY_CARE_PROVIDER_SITE_OTHER): Payer: Medicare Other

## 2022-03-27 DIAGNOSIS — E1165 Type 2 diabetes mellitus with hyperglycemia: Secondary | ICD-10-CM

## 2022-03-27 DIAGNOSIS — E7800 Pure hypercholesterolemia, unspecified: Secondary | ICD-10-CM | POA: Diagnosis not present

## 2022-03-27 LAB — BASIC METABOLIC PANEL
BUN: 31 mg/dL — ABNORMAL HIGH (ref 6–23)
CO2: 23 mEq/L (ref 19–32)
Calcium: 9.3 mg/dL (ref 8.4–10.5)
Chloride: 103 mEq/L (ref 96–112)
Creatinine, Ser: 1.31 mg/dL — ABNORMAL HIGH (ref 0.40–1.20)
GFR: 35.21 mL/min — ABNORMAL LOW (ref >60.00)
Glucose, Bld: 90 mg/dL (ref 70–99)
Potassium: 4.7 mEq/L (ref 3.5–5.1)
Sodium: 135 mEq/L (ref 135–145)

## 2022-03-27 LAB — HEPATIC FUNCTION PANEL
ALT: 17 U/L (ref 0–35)
AST: 20 U/L (ref 0–37)
Albumin: 4.4 g/dL (ref 3.5–5.2)
Alkaline Phosphatase: 55 U/L (ref 39–117)
Bilirubin, Direct: 0.1 mg/dL (ref 0.0–0.3)
Total Bilirubin: 0.5 mg/dL (ref 0.2–1.2)
Total Protein: 6.4 g/dL (ref 6.0–8.3)

## 2022-03-27 LAB — LIPID PANEL
Cholesterol: 164 mg/dL (ref 0–200)
HDL: 54.2 mg/dL (ref >39.00)
LDL Cholesterol: 83 mg/dL (ref 0–99)
NonHDL: 109.5
Total CHOL/HDL Ratio: 3
Triglycerides: 131 mg/dL (ref 0.0–149.0)
VLDL: 26.2 mg/dL (ref 0.0–40.0)

## 2022-03-27 LAB — TSH: TSH: 4.51 u[IU]/mL (ref 0.35–5.50)

## 2022-03-27 LAB — HEMOGLOBIN A1C: Hgb A1c MFr Bld: 6.7 % — ABNORMAL HIGH (ref 4.6–6.5)

## 2022-03-29 ENCOUNTER — Other Ambulatory Visit: Payer: Self-pay | Admitting: *Deleted

## 2022-03-29 MED ORDER — QUINAPRIL HCL 40 MG PO TABS: 40.0000 mg | ORAL_TABLET | Freq: Every day | ORAL | 3 refills | Status: DC

## 2022-05-02 ENCOUNTER — Other Ambulatory Visit: Payer: Self-pay

## 2022-05-02 ENCOUNTER — Telehealth: Payer: Self-pay

## 2022-05-02 MED ORDER — LISINOPRIL 40 MG PO TABS: 40.0000 mg | ORAL_TABLET | Freq: Every day | ORAL | 3 refills | Status: DC

## 2022-05-02 NOTE — Telephone Encounter (Signed)
If she is agreeable, I would like to change her quinapril to lisinopril 40mg  q day.  Will take one per day  ?

## 2022-05-02 NOTE — Telephone Encounter (Signed)
Patient stopped by with a note saying she can't get her Quinapril because she said it's been recalled due to a cancer scare.  Patient states she has been on this drug for the last 20 years.  Patient said she has enough pills to get through Sunday.  Patient would like to know what she should do since all the warnings say not to stop taking this drug.  Please call. ?

## 2022-05-02 NOTE — Telephone Encounter (Signed)
Pt was willing to try the lisinopril 40 mg. I have sent to [facility] & she will watch BP for any changes as well.  ?

## 2022-05-02 NOTE — Telephone Encounter (Signed)
Lm for pt to cb.

## 2022-05-11 ENCOUNTER — Other Ambulatory Visit: Payer: Self-pay | Admitting: Cardiovascular Disease

## 2022-06-01 ENCOUNTER — Encounter: Payer: Self-pay | Admitting: Internal Medicine

## 2022-06-01 ENCOUNTER — Ambulatory Visit: Payer: Medicare Other | Admitting: Internal Medicine

## 2022-06-01 DIAGNOSIS — R42 Dizziness and giddiness: Secondary | ICD-10-CM

## 2022-06-01 DIAGNOSIS — I700 Atherosclerosis of aorta: Secondary | ICD-10-CM | POA: Diagnosis not present

## 2022-06-01 DIAGNOSIS — E1165 Type 2 diabetes mellitus with hyperglycemia: Secondary | ICD-10-CM

## 2022-06-01 DIAGNOSIS — I10 Essential (primary) hypertension: Secondary | ICD-10-CM | POA: Diagnosis not present

## 2022-06-01 DIAGNOSIS — I471 Supraventricular tachycardia: Secondary | ICD-10-CM

## 2022-06-01 DIAGNOSIS — E7800 Pure hypercholesterolemia, unspecified: Secondary | ICD-10-CM

## 2022-06-01 DIAGNOSIS — F439 Reaction to severe stress, unspecified: Secondary | ICD-10-CM

## 2022-06-01 DIAGNOSIS — Z9109 Other allergy status, other than to drugs and biological substances: Secondary | ICD-10-CM

## 2022-06-01 DIAGNOSIS — N1831 Chronic kidney disease, stage 3a: Secondary | ICD-10-CM

## 2022-06-01 MED ORDER — AZELASTINE HCL 0.1 % NA SOLN: 1.0000 | Freq: Two times a day (BID) | NASAL | 1 refills | Status: DC

## 2022-06-01 NOTE — Progress Notes (Signed)
Patient ID: Elizabeth [name], female   DOB: July 02, 1927, 88 y.o.   MRN: [id number]   Subjective:    Patient ID: Elizabeth [name], female    DOB: 07-08-1927, 88 y.o.   MRN: [id number]   Patient here for a scheduled follow up.    Chief Complaint  Patient presents with   Follow-up   .   HPI Follow up regarding her blood pressure, dizziness and hypercholesterolemia.  Has had persistent problems with vertigo, imbalance - dizziness.  Negative evaluation for BPV.  Has seen neurology.  Concerned regarding vestibular migraines.  Recommended continuing vestibular exercises at home.  MRI unrevealing.  Light headedness/imbalance - varies.  Some days better than others.  Eating.  No nausea or vomiting.  No chest pain.  Breathing stable.  No increased cough or congestion.  No abdominal pain.  Bowels moving.     Past Medical History:  Diagnosis Date   Cystocele or rectocele with incomplete uterine prolapse    Hyperlipidemia    Hypertension    Irritable bowel syndrome    Palpitations    Vaginal atrophy    Past Surgical History:  Procedure Laterality Date   CATARACT EXTRACTION  2006   Family History  Problem Relation Age of Onset   Transient ischemic attack Mother        multiple    Dementia Mother    Stroke Father    Heart disease Brother        MI - died age 7   Leukemia Brother    Lung cancer Brother    Throat cancer Brother        had heart disease also   Heart disease Sister    Dementia Sister    Social History   Socioeconomic History   Marital status: Widowed    Spouse name: Not on file   Number of children: 2   Years of education: Not on file   Highest education level: Not on file  Occupational History   Not on file  Tobacco Use   Smoking status: Never   Smokeless tobacco: Never  Vaping Use   Vaping Use: Never used  Substance and Sexual Activity   Alcohol use: No    Alcohol/week: 0.0 standard drinks of alcohol   Drug use: No   Sexual activity: Not Currently    Birth  control/protection: None  Other Topics Concern   Not on file  Social History Narrative   Not on file   Social Determinants of Health   Financial Resource Strain: Low Risk  (09/19/2021)   Overall Financial Resource Strain (CARDIA)    Difficulty of Paying Living Expenses: Not hard at all  Food Insecurity: No Food Insecurity (09/19/2021)   Hunger Vital Sign    Worried About Running Out of Food in the Last Year: Never true    [place] in the Last Year: Never true  Transportation Needs: No Transportation Needs (09/19/2021)   PRAPARE - Hydrologist (Medical): No    Lack of Transportation (Non-Medical): No  Physical Activity: Insufficiently Active (09/19/2021)   Exercise Vital Sign    Days of Exercise per Week: 7 days    Minutes of Exercise per Session: 20 min  Stress: No Stress Concern Present (09/19/2021)   [place]    Feeling of Stress : Not at all  Social Connections: Unknown (09/19/2021)   Social Connection and Isolation Panel [NHANES]  Frequency of Communication with Friends and Family: More than three times a week    Frequency of Social Gatherings with Friends and Family: Not on file    Attends Religious Services: Not on file    Active Member of Clubs or Organizations: Not on file    Attends Archivist Meetings: Not on file    Marital Status: Widowed     Review of Systems  Constitutional:  Negative for appetite change and unexpected weight change.  HENT:  Negative for congestion and sinus pressure.   Respiratory:  Negative for cough, chest tightness and shortness of breath.   Cardiovascular:  Negative for chest pain, palpitations and leg swelling.  Gastrointestinal:  Negative for abdominal pain, diarrhea, nausea and vomiting.  Genitourinary:  Negative for difficulty urinating and dysuria.  Musculoskeletal:  Negative for joint swelling and myalgias.  Skin:  Negative  for color change and rash.  Neurological:  Positive for dizziness and light-headedness. Negative for headaches.  Psychiatric/Behavioral:  Negative for agitation and dysphoric mood.        Objective:     BP (!) 160/90 (BP Location: Left Arm, Patient Position: Sitting, Cuff Size: Small)   Pulse 67   Temp 97.6 F (36.4 C) (Temporal)   Resp 17   Ht '5\' 1"'  (1.549 m)   Wt 145 lb 6.4 oz (66 kg)   SpO2 98%   BMI 27.47 kg/m  Wt Readings from Last 3 Encounters:  06/01/22 145 lb 6.4 oz (66 kg)  02/26/22 147 lb (66.7 kg)  02/16/22 146 lb (66.2 kg)    Physical Exam Vitals reviewed.  Constitutional:      General: She is not in acute distress.    Appearance: Normal appearance.  HENT:     Head: Normocephalic and atraumatic.     Right Ear: External ear normal.     Left Ear: External ear normal.  Eyes:     General: No scleral icterus.       Right eye: No discharge.        Left eye: No discharge.     Conjunctiva/sclera: Conjunctivae normal.  Neck:     Thyroid: No thyromegaly.  Cardiovascular:     Rate and Rhythm: Normal rate and regular rhythm.  Pulmonary:     Effort: No respiratory distress.     Breath sounds: Normal breath sounds. No wheezing.  Abdominal:     General: Bowel sounds are normal.     Palpations: Abdomen is soft.     Tenderness: There is no abdominal tenderness.  Musculoskeletal:        General: No swelling or tenderness.     Cervical back: Neck supple. No tenderness.  Lymphadenopathy:     Cervical: No cervical adenopathy.  Skin:    Findings: No erythema or rash.  Neurological:     Mental Status: She is alert.  Psychiatric:        Mood and Affect: Mood normal.        Behavior: Behavior normal.      Outpatient Encounter Medications as of 06/01/2022  Medication Sig   aspirin 81 MG tablet Take 81 mg by mouth daily.   azelastine (ASTELIN) 0.1 % nasal spray Place 1 spray into both nostrils 2 (two) times daily. Use in each nostril as directed   cetirizine  (ZYRTEC) 5 MG tablet Take 5 mg by mouth daily as needed.    LIPITOR 40 MG tablet TAKE 1 TABLET BY MOUTH ONCE DAILY   lisinopril (ZESTRIL) 40 MG  tablet Take 1 tablet (40 mg total) by mouth daily.   meclizine (ANTIVERT) 12.5 MG tablet Take 2 tablets (25 mg total) by mouth 3 (three) times daily as needed for dizziness.   metoprolol tartrate (LOPRESSOR) 25 MG tablet TAKE 1 TABLET BY MOUTH TWICE DAILY   ondansetron (ZOFRAN) 4 MG tablet Take 1 tablet (4 mg total) by mouth daily as needed for nausea or vomiting.   triamcinolone (NASACORT) 55 MCG/ACT nasal inhaler Place 2 sprays into the nose daily as needed.    [DISCONTINUED] levocetirizine (XYZAL) 5 MG tablet Take 1 tablet (5 mg total) by mouth daily. (Patient not taking: Reported on 06/01/2022)   No facility-administered encounter medications on file as of 06/01/2022.     Lab Results  Component Value Date   WBC 6.8 10/29/2021   HGB 12.4 10/29/2021   HCT 37.8 10/29/2021   PLT 210 10/29/2021   GLUCOSE 90 03/27/2022   CHOL 164 03/27/2022   TRIG 131.0 03/27/2022   HDL 54.20 03/27/2022   LDLDIRECT 178.4 11/04/2013   LDLCALC 83 03/27/2022   ALT 17 03/27/2022   AST 20 03/27/2022   NA 135 03/27/2022   K 4.7 03/27/2022   CL 103 03/27/2022   CREATININE 1.31 (H) 03/27/2022   BUN 31 (H) 03/27/2022   CO2 23 03/27/2022   TSH 4.51 03/27/2022   HGBA1C 6.7 (H) 03/27/2022   MICROALBUR <0.7 04/05/2021    MR BRAIN WO CONTRAST  Result Date: 10/26/2021 CLINICAL DATA:  88 year old female with vertigo, nausea and vomiting for 2 days. EXAM: MRI HEAD WITHOUT CONTRAST TECHNIQUE: Multiplanar, multiecho pulse sequences of the brain and surrounding structures were obtained without intravenous contrast. COMPARISON:  Head CT without contrast 2358 hours last night. Brain MRI 05/01/2021. FINDINGS: Brain: No restricted diffusion to suggest acute infarction. No midline shift, mass effect, evidence of mass lesion, ventriculomegaly, extra-axial collection or acute  intracranial hemorrhage. Cervicomedullary junction and pituitary are within normal limits. Scattered and patchy but overall mild for age bilateral cerebral white matter T2 and FLAIR hyperintensity is stable since May. No cortical encephalomalacia identified. No definite chronic cerebral blood products. Deep gray matter nuclei, brainstem and cerebellum remain normal for age. Vascular: Major intracranial vascular flow voids are stable. Skull and upper cervical spine: Negative. Sinuses/Orbits: Stable, negative. Other: Mastoids remain clear. Stable and grossly normal visible internal auditory structures. Stylomastoid foramina and parotid glands appear normal. IMPRESSION: 1. No acute intracranial abnormality. 2. Stable since May and largely normal for age noncontrast MRI appearance of the brain. Electronically Signed   By: Genevie Ann M.D.   On: 10/26/2021 05:17   CT Head Wo Contrast  Result Date: 10/26/2021 CLINICAL DATA:  Dizziness. EXAM: CT HEAD WITHOUT CONTRAST TECHNIQUE: Contiguous axial images were obtained from the base of the skull through the vertex without intravenous contrast. COMPARISON:  None. FINDINGS: Brain: There is mild cerebral atrophy with widening of the extra-axial spaces and ventricular dilatation. There are areas of decreased attenuation within the white matter tracts of the supratentorial brain, consistent with microvascular disease changes. Vascular: No hyperdense vessel or unexpected calcification. Skull: Normal. Negative for fracture or focal lesion. Sinuses/Orbits: No acute finding. Other: None. IMPRESSION: 1. Generalized cerebral atrophy. 2. No acute intracranial abnormality. Electronically Signed   By: Virgina Norfolk M.D.   On: 10/26/2021 00:09       Assessment & Plan:   Problem List Items Addressed This Visit     Aortic atherosclerosis ([place])    Continue lipitor and aspirin.  CKD (chronic kidney disease) stage 3, GFR 30-59 ml/min (HCC)     Avoid antiinflammatories.   Continue to stay hydrated.  Follow metabolic panel.       Dizziness    Recently admitted with vertigo as outlined.  W/up as outlined previously.  CT and MRI unrevealing.  No stroke, mass, etc.  PT evaluated and recommended vestibular rehab as outpt.  Also recommended ENT f/u.  Discussed with her today regarding ENT evaluation and treatment.  She has declined.  Has seen and previously and negative evaluation for BPV.   Had vestibular rehab.  Felt helped.  Discussed referral back.  Discussed slow position changes and movements.  Follow.  Call with update.       Environmental allergies    Stable - zyrtec/nasacort.  Follow.       Hypercholesterolemia    On lipitor.  Low cholesterol diet and exercise.  Follow lipid panel and liver function tests.       Relevant Orders   Hepatic function panel   Lipid panel   Hypertension    Blood pressure initially elevated.  Recheck improved.  Just recently changed quinapril to lisinopril.  Have her spot check her pressure.  Send in readings.  Schedule f/u to reassess.  Follow metabolic panel.       Relevant Orders   Basic metabolic panel   Stress    Increased stress related to current medical issues.  Discussed.  Does not feel needs any further intervention.  Follow.       SVT (supraventricular tachycardia) ([place])    Followed by cardiology.  Continue lopressor.  Overall stable.  Follow.       Type 2 diabetes mellitus with hyperglycemia (HCC)    Low carb diet and exercise given elevated blood glucose. Follow met b and a1c.       Relevant Orders   Hemoglobin A1c   Microalbumin / creatinine urine ratio     Einar Pheasant, MD

## 2022-06-11 ENCOUNTER — Encounter: Payer: Self-pay | Admitting: Internal Medicine

## 2022-06-11 NOTE — Assessment & Plan Note (Signed)
Followed by cardiology.  Continue lopressor.  Overall stable.  Follow.  

## 2022-06-11 NOTE — Assessment & Plan Note (Signed)
Increased stress related to current medical issues.  Discussed.  Does not feel needs any further intervention.  Follow.

## 2022-06-11 NOTE — Assessment & Plan Note (Signed)
Low carb diet and exercise given elevated blood glucose. Follow met b and a1c.  

## 2022-06-11 NOTE — Assessment & Plan Note (Signed)
Blood pressure initially elevated.  Recheck improved.  Just recently changed quinapril to lisinopril.  Have her spot check her pressure.  Send in readings.  Schedule f/u to reassess.  Follow metabolic panel.

## 2022-06-11 NOTE — Assessment & Plan Note (Signed)
On lipitor.  Low cholesterol diet and exercise.  Follow lipid panel and liver function tests.   

## 2022-06-11 NOTE — Assessment & Plan Note (Signed)
Continue lipitor and aspirin.  ?

## 2022-06-11 NOTE — Assessment & Plan Note (Signed)
Avoid antiinflammatories.  Continue to stay hydrated.  Follow metabolic panel.  

## 2022-06-11 NOTE — Assessment & Plan Note (Signed)
Recently admitted with vertigo as outlined.  W/up as outlined previously.  CT and MRI unrevealing.  No stroke, mass, etc.  PT evaluated and recommended vestibular rehab as outpt.  Also recommended ENT f/u.  Discussed with her today regarding ENT evaluation and treatment.  She has declined.  Has seen and previously and negative evaluation for BPV.   Had vestibular rehab.  Felt helped.  Discussed referral back.  Discussed slow position changes and movements.  Follow.  Call with update.

## 2022-06-11 NOTE — Assessment & Plan Note (Signed)
Stable - zyrtec/nasacort.  Follow.  

## 2022-08-28 ENCOUNTER — Other Ambulatory Visit (INDEPENDENT_AMBULATORY_CARE_PROVIDER_SITE_OTHER): Payer: Medicare Other

## 2022-08-28 DIAGNOSIS — E7800 Pure hypercholesterolemia, unspecified: Secondary | ICD-10-CM | POA: Diagnosis not present

## 2022-08-28 DIAGNOSIS — E1165 Type 2 diabetes mellitus with hyperglycemia: Secondary | ICD-10-CM | POA: Diagnosis not present

## 2022-08-28 DIAGNOSIS — I10 Essential (primary) hypertension: Secondary | ICD-10-CM

## 2022-08-28 LAB — BASIC METABOLIC PANEL
BUN: 34 mg/dL — ABNORMAL HIGH (ref 6–23)
CO2: 22 mEq/L (ref 19–32)
Calcium: 9.5 mg/dL (ref 8.4–10.5)
Chloride: 103 mEq/L (ref 96–112)
Creatinine, Ser: 1.36 mg/dL — ABNORMAL HIGH (ref 0.40–1.20)
GFR: 33.56 mL/min — ABNORMAL LOW (ref >60.00)
Glucose, Bld: 102 mg/dL — ABNORMAL HIGH (ref 70–99)
Potassium: 4.8 mEq/L (ref 3.5–5.1)
Sodium: 136 mEq/L (ref 135–145)

## 2022-08-28 LAB — HEPATIC FUNCTION PANEL
ALT: 18 U/L (ref 0–35)
AST: 18 U/L (ref 0–37)
Albumin: 4.2 g/dL (ref 3.5–5.2)
Alkaline Phosphatase: 70 U/L (ref 39–117)
Bilirubin, Direct: 0.1 mg/dL (ref 0.0–0.3)
Total Bilirubin: 0.4 mg/dL (ref 0.2–1.2)
Total Protein: 6.7 g/dL (ref 6.0–8.3)

## 2022-08-28 LAB — LIPID PANEL
Cholesterol: 173 mg/dL (ref 0–200)
HDL: 47.5 mg/dL (ref >39.00)
LDL Cholesterol: 95 mg/dL (ref 0–99)
NonHDL: 125.99
Total CHOL/HDL Ratio: 4
Triglycerides: 153 mg/dL — ABNORMAL HIGH (ref 0.0–149.0)
VLDL: 30.6 mg/dL (ref 0.0–40.0)

## 2022-08-28 LAB — HEMOGLOBIN A1C: Hgb A1c MFr Bld: 7 % — ABNORMAL HIGH (ref 4.6–6.5)

## 2022-08-28 NOTE — Addendum Note (Signed)
Addended by: Clearnce Sorrel on: 08/28/2022 10:04 AM   Modules accepted: Orders

## 2022-08-29 ENCOUNTER — Other Ambulatory Visit: Payer: Medicare Other

## 2022-08-31 LAB — MICROALBUMIN / CREATININE URINE RATIO
Creatinine,U: 64 mg/dL
Microalb Creat Ratio: 5.2 mg/g (ref 0.0–30.0)
Microalb, Ur: 3.3 mg/dL — ABNORMAL HIGH (ref 0.0–1.9)

## 2022-09-03 ENCOUNTER — Encounter: Payer: Self-pay | Admitting: Internal Medicine

## 2022-09-03 ENCOUNTER — Ambulatory Visit: Payer: Medicare Other | Admitting: Internal Medicine

## 2022-09-03 DIAGNOSIS — F439 Reaction to severe stress, unspecified: Secondary | ICD-10-CM

## 2022-09-03 DIAGNOSIS — I700 Atherosclerosis of aorta: Secondary | ICD-10-CM | POA: Diagnosis not present

## 2022-09-03 DIAGNOSIS — I471 Supraventricular tachycardia: Secondary | ICD-10-CM | POA: Diagnosis not present

## 2022-09-03 DIAGNOSIS — N1831 Chronic kidney disease, stage 3a: Secondary | ICD-10-CM

## 2022-09-03 DIAGNOSIS — I10 Essential (primary) hypertension: Secondary | ICD-10-CM

## 2022-09-03 DIAGNOSIS — Z960 Presence of urogenital implants: Secondary | ICD-10-CM

## 2022-09-03 DIAGNOSIS — R42 Dizziness and giddiness: Secondary | ICD-10-CM

## 2022-09-03 DIAGNOSIS — E1165 Type 2 diabetes mellitus with hyperglycemia: Secondary | ICD-10-CM

## 2022-09-03 DIAGNOSIS — E7800 Pure hypercholesterolemia, unspecified: Secondary | ICD-10-CM

## 2022-09-03 DIAGNOSIS — Z23 Encounter for immunization: Secondary | ICD-10-CM

## 2022-09-03 NOTE — Progress Notes (Signed)
Patient ID: Elizabeth [name], female   DOB: [birth year], 88 y.o.   MRN: [id number]   Subjective:    Patient ID: Elizabeth [name], female    DOB: 1927/12/13, 88 y.o.   MRN: [id number]   Patient here for  Chief Complaint  Patient presents with   Follow-up    3 mon, denies any new concerns or pain.    Marland Kitchen   HPI Here to follow up regarding her blood pressure, dizziness and hypercholesterolemia.  Has had persistent problems with vertigo, imbalance and dizziness.  Previous negative evaluation for BPV.  Has seen neurology.  Concerned regarding vestibular migraines.  Recommended continuing vestibular exercises at home.  Previous MRI unrevealing.  Symptoms vary.  She request f/u with Dr Tami Ribas for reevaluation.  Taking magnesium.  No chest pain.  Breathing stable.  Reports does not go out often.  Eating.  No vomiting.  No abdominal pain.  Sees Dr Marcelline Mates - pessary.    Past Medical History:  Diagnosis Date   Cystocele or rectocele with incomplete uterine prolapse    Hyperlipidemia    Hypertension    Irritable bowel syndrome    Palpitations    Vaginal atrophy    Past Surgical History:  Procedure Laterality Date   CATARACT EXTRACTION  2006   Family History  Problem Relation Age of Onset   Transient ischemic attack Mother        multiple    Dementia Mother    Stroke Father    Heart disease Brother        MI - died age 37   Leukemia Brother    Lung cancer Brother    Throat cancer Brother        had heart disease also   Heart disease Sister    Dementia Sister    Social History   Socioeconomic History   Marital status: Widowed    Spouse name: Not on file   Number of children: 2   Years of education: Not on file   Highest education level: Not on file  Occupational History   Not on file  Tobacco Use   Smoking status: Never   Smokeless tobacco: Never  Vaping Use   Vaping Use: Never used  Substance and Sexual Activity   Alcohol use: No    Alcohol/week: 0.0 standard drinks of alcohol    Drug use: No   Sexual activity: Not Currently    Birth control/protection: None  Other Topics Concern   Not on file  Social History Narrative   Not on file   Social Determinants of Health   Financial Resource Strain: Low Risk  (09/19/2021)   Overall Financial Resource Strain (CARDIA)    Difficulty of Paying Living Expenses: Not hard at all  Food Insecurity: No Food Insecurity (09/19/2021)   Hunger Vital Sign    Worried About Running Out of Food in the Last Year: Never true    [place] in the Last Year: Never true  Transportation Needs: No Transportation Needs (09/19/2021)   PRAPARE - Hydrologist (Medical): No    Lack of Transportation (Non-Medical): No  Physical Activity: Insufficiently Active (09/19/2021)   Exercise Vital Sign    Days of Exercise per Week: 7 days    Minutes of Exercise per Session: 20 min  Stress: No Stress Concern Present (09/19/2021)   [place]    Feeling of Stress : Not at all  Social Connections: Unknown (09/19/2021)   Social Connection and Isolation Panel [NHANES]    Frequency of Communication with Friends and Family: More than three times a week    Frequency of Social Gatherings with Friends and Family: Not on file    Attends Religious Services: Not on file    Active Member of Clubs or Organizations: Not on file    Attends Archivist Meetings: Not on file    Marital Status: Widowed     Review of Systems  Constitutional:  Negative for appetite change and unexpected weight change.  HENT:  Negative for congestion and sinus pressure.   Respiratory:  Negative for cough, chest tightness and shortness of breath.   Cardiovascular:  Negative for chest pain, palpitations and leg swelling.  Gastrointestinal:  Negative for abdominal pain, diarrhea, nausea and vomiting.  Genitourinary:  Negative for difficulty urinating and dysuria.  Musculoskeletal:   Negative for joint swelling and myalgias.  Skin:  Negative for color change and rash.  Neurological:  Positive for dizziness, light-headedness and headaches.  Psychiatric/Behavioral:  Negative for agitation and dysphoric mood.        Objective:     BP (!) 140/70 (BP Location: Left Arm, Patient Position: Sitting, Cuff Size: Normal)   Pulse 68   Temp 98 F (36.7 C) (Oral)   Resp 14   Ht '5\' 1"'  (1.549 m)   Wt 145 lb (65.8 kg)   SpO2 98%   BMI 27.40 kg/m  Wt Readings from Last 3 Encounters:  09/03/22 145 lb (65.8 kg)  06/01/22 145 lb 6.4 oz (66 kg)  02/26/22 147 lb (66.7 kg)    Physical Exam Vitals reviewed.  Constitutional:      General: She is not in acute distress.    Appearance: Normal appearance.  HENT:     Head: Normocephalic and atraumatic.     Right Ear: External ear normal.     Left Ear: External ear normal.  Eyes:     General: No scleral icterus.       Right eye: No discharge.        Left eye: No discharge.     Conjunctiva/sclera: Conjunctivae normal.  Neck:     Thyroid: No thyromegaly.  Cardiovascular:     Rate and Rhythm: Normal rate and regular rhythm.  Pulmonary:     Effort: No respiratory distress.     Breath sounds: Normal breath sounds. No wheezing.  Abdominal:     General: Bowel sounds are normal.     Palpations: Abdomen is soft.     Tenderness: There is no abdominal tenderness.  Musculoskeletal:        General: No swelling or tenderness.     Cervical back: Neck supple. No tenderness.  Lymphadenopathy:     Cervical: No cervical adenopathy.  Skin:    Findings: No erythema or rash.  Neurological:     Mental Status: She is alert.  Psychiatric:        Mood and Affect: Mood normal.        Behavior: Behavior normal.      Outpatient Encounter Medications as of 09/03/2022  Medication Sig   aspirin 81 MG tablet Take 81 mg by mouth daily.   azelastine (ASTELIN) 0.1 % nasal spray Place 1 spray into both nostrils 2 (two) times daily. Use in each  nostril as directed   cetirizine (ZYRTEC) 5 MG tablet Take 5 mg by mouth daily as needed.    LIPITOR 40 MG tablet TAKE 1  TABLET BY MOUTH ONCE DAILY   lisinopril (ZESTRIL) 40 MG tablet Take 1 tablet (40 mg total) by mouth daily.   meclizine (ANTIVERT) 12.5 MG tablet Take 2 tablets (25 mg total) by mouth 3 (three) times daily as needed for dizziness.   metoprolol tartrate (LOPRESSOR) 25 MG tablet TAKE 1 TABLET BY MOUTH TWICE DAILY   ondansetron (ZOFRAN) 4 MG tablet Take 1 tablet (4 mg total) by mouth daily as needed for nausea or vomiting.   triamcinolone (NASACORT) 55 MCG/ACT nasal inhaler Place 2 sprays into the nose daily as needed.    No facility-administered encounter medications on file as of 09/03/2022.     Lab Results  Component Value Date   WBC 6.8 10/29/2021   HGB 12.4 10/29/2021   HCT 37.8 10/29/2021   PLT 210 10/29/2021   GLUCOSE 102 (H) 08/28/2022   CHOL 173 08/28/2022   TRIG 153.0 (H) 08/28/2022   HDL 47.50 08/28/2022   LDLDIRECT 178.4 11/04/2013   LDLCALC 95 08/28/2022   ALT 18 08/28/2022   AST 18 08/28/2022   NA 136 08/28/2022   K 4.8 08/28/2022   CL 103 08/28/2022   CREATININE 1.36 (H) 08/28/2022   BUN 34 (H) 08/28/2022   CO2 22 08/28/2022   TSH 4.51 03/27/2022   HGBA1C 7.0 (H) 08/28/2022   MICROALBUR 3.3 (H) 08/31/2022    MR BRAIN WO CONTRAST  Result Date: 10/26/2021 CLINICAL DATA:  88 year old female with vertigo, nausea and vomiting for 2 days. EXAM: MRI HEAD WITHOUT CONTRAST TECHNIQUE: Multiplanar, multiecho pulse sequences of the brain and surrounding structures were obtained without intravenous contrast. COMPARISON:  Head CT without contrast 2358 hours last night. Brain MRI 05/01/2021. FINDINGS: Brain: No restricted diffusion to suggest acute infarction. No midline shift, mass effect, evidence of mass lesion, ventriculomegaly, extra-axial collection or acute intracranial hemorrhage. Cervicomedullary junction and pituitary are within normal limits. Scattered  and patchy but overall mild for age bilateral cerebral white matter T2 and FLAIR hyperintensity is stable since May. No cortical encephalomalacia identified. No definite chronic cerebral blood products. Deep gray matter nuclei, brainstem and cerebellum remain normal for age. Vascular: Major intracranial vascular flow voids are stable. Skull and upper cervical spine: Negative. Sinuses/Orbits: Stable, negative. Other: Mastoids remain clear. Stable and grossly normal visible internal auditory structures. Stylomastoid foramina and parotid glands appear normal. IMPRESSION: 1. No acute intracranial abnormality. 2. Stable since May and largely normal for age noncontrast MRI appearance of the brain. Electronically Signed   By: Genevie Ann M.D.   On: 10/26/2021 05:17   CT Head Wo Contrast  Result Date: 10/26/2021 CLINICAL DATA:  Dizziness. EXAM: CT HEAD WITHOUT CONTRAST TECHNIQUE: Contiguous axial images were obtained from the base of the skull through the vertex without intravenous contrast. COMPARISON:  None. FINDINGS: Brain: There is mild cerebral atrophy with widening of the extra-axial spaces and ventricular dilatation. There are areas of decreased attenuation within the white matter tracts of the supratentorial brain, consistent with microvascular disease changes. Vascular: No hyperdense vessel or unexpected calcification. Skull: Normal. Negative for fracture or focal lesion. Sinuses/Orbits: No acute finding. Other: None. IMPRESSION: 1. Generalized cerebral atrophy. 2. No acute intracranial abnormality. Electronically Signed   By: Virgina Norfolk M.D.   On: 10/26/2021 00:09       Assessment & Plan:   Problem List Items Addressed This Visit     Aortic atherosclerosis ([place])    Continue lipitor and aspirin.        CKD (chronic kidney disease) stage 3,  GFR 30-59 ml/min (HCC)     Avoid antiinflammatories.  Continue to stay hydrated.  Follow metabolic panel.       Dizziness    Has had persistent problems  with vertigo, imbalance and dizziness.  Previous negative evaluation for BPV.  Has seen neurology.  Concerned regarding vestibular migraines.  Recommended continuing vestibular exercises at home.  Previous MRI unrevealing.  Symptoms vary.  She request f/u with Dr Tami Ribas for reevaluation.       Relevant Orders   Ambulatory referral to ENT   Hypercholesterolemia    On lipitor.  Low cholesterol diet and exercise.  Follow lipid panel and liver function tests.       Relevant Orders   CBC with Differential/Platelet   Hepatic function panel   Lipid panel   Hypertension    On lisinopril now.  Continues metoprolol.  Spot check pressures.  Follow metabolic panel.       Relevant Orders   Basic metabolic panel   Stress    Increased stress.  Discussed.  Will notify me if feels needs further intervention.  Follow.       SVT (supraventricular tachycardia) ([place])    Followed by cardiology.  Continue lopressor.  Overall stable.  Follow.       Type 2 diabetes mellitus with hyperglycemia (HCC)    Low carb diet and exercise given elevated blood glucose. Follow met b and a1c.       Relevant Orders   Hemoglobin A1c   Vaginal pessary in situ    Followed by gyn.       Other Visit Diagnoses     Need for immunization against influenza       Relevant Orders   Flu Vaccine QUAD High Dose(Fluad) (Completed)        Einar Pheasant, MD

## 2022-09-09 ENCOUNTER — Encounter: Payer: Self-pay | Admitting: Internal Medicine

## 2022-09-09 NOTE — Assessment & Plan Note (Signed)
On lisinopril now.  Continues metoprolol.  Spot check pressures.  Follow metabolic panel.

## 2022-09-09 NOTE — Assessment & Plan Note (Signed)
Has had persistent problems with vertigo, imbalance and dizziness.  Previous negative evaluation for BPV.  Has seen neurology.  Concerned regarding vestibular migraines.  Recommended continuing vestibular exercises at home.  Previous MRI unrevealing.  Symptoms vary.  She request f/u with Dr Tami Ribas for reevaluation.

## 2022-09-09 NOTE — Assessment & Plan Note (Signed)
On lipitor.  Low cholesterol diet and exercise.  Follow lipid panel and liver function tests.   

## 2022-09-09 NOTE — Assessment & Plan Note (Signed)
Followed by cardiology.  Continue lopressor.  Overall stable.  Follow.  

## 2022-09-09 NOTE — Assessment & Plan Note (Signed)
Avoid antiinflammatories.  Continue to stay hydrated.  Follow metabolic panel.  

## 2022-09-09 NOTE — Assessment & Plan Note (Signed)
Continue lipitor and aspirin.  ?

## 2022-09-09 NOTE — Assessment & Plan Note (Signed)
Followed by gyn

## 2022-09-09 NOTE — Assessment & Plan Note (Signed)
Low carb diet and exercise given elevated blood glucose. Follow met b and a1c.  

## 2022-09-09 NOTE — Assessment & Plan Note (Signed)
Increased stress.  Discussed.  Will notify me if feels needs further intervention.  Follow.

## 2022-09-19 ENCOUNTER — Telehealth: Payer: Self-pay | Admitting: Internal Medicine

## 2022-09-19 DIAGNOSIS — J3489 Other specified disorders of nose and nasal sinuses: Secondary | ICD-10-CM

## 2022-09-19 DIAGNOSIS — H9209 Otalgia, unspecified ear: Secondary | ICD-10-CM

## 2022-09-19 NOTE — Telephone Encounter (Signed)
Order placed for ENT referral for headache, drainage and earaches.

## 2022-09-25 ENCOUNTER — Telehealth: Payer: Self-pay | Admitting: Internal Medicine

## 2022-09-25 NOTE — Telephone Encounter (Signed)
Patient states she is returning our call.  I read Juliann Pulse Harris-Coley's message to patient.  Patient states she will return Kathy's call.

## 2022-09-25 NOTE — Telephone Encounter (Signed)
Copied from [place] [phone]. Topic: Medicare AWV >> Sep 25, 2022  2:43 PM Devoria Glassing wrote: Reason for CRM: Left message for patient to schedule Annual Wellness Visit.  Please schedule with Nurse Health Advisor Denisa O'Brien-Blaney, LPN at [facility]. This appt can be telephone or office visit.  Please call [phone] ask for [facility]

## 2022-09-28 ENCOUNTER — Ambulatory Visit: Payer: Medicare Other

## 2022-09-28 ENCOUNTER — Telehealth: Payer: Self-pay

## 2022-09-28 ENCOUNTER — Encounter: Payer: Self-pay | Admitting: Internal Medicine

## 2022-09-28 DIAGNOSIS — J310 Chronic rhinitis: Secondary | ICD-10-CM | POA: Insufficient documentation

## 2022-09-28 DIAGNOSIS — H608X9 Other otitis externa, unspecified ear: Secondary | ICD-10-CM | POA: Insufficient documentation

## 2022-09-28 NOTE — Telephone Encounter (Signed)
No answer when called for scheduled AWV. Left message to call the office back for reschedule.

## 2022-10-02 ENCOUNTER — Ambulatory Visit (INDEPENDENT_AMBULATORY_CARE_PROVIDER_SITE_OTHER): Payer: Medicare Other

## 2022-10-02 VITALS — Ht 61.0 in | Wt 145.0 lb

## 2022-10-02 DIAGNOSIS — Z0000 Encounter for general adult medical examination without abnormal findings: Secondary | ICD-10-CM

## 2022-10-02 NOTE — Patient Instructions (Addendum)
Elizabeth [name] , Thank you for taking time to come for your Medicare Wellness Visit. I appreciate your ongoing commitment to your health goals. Please review the following plan we discussed and let me know if I can assist you in the future.   These are the goals we discussed:  Goals      Maintain Healthy Lifestyle     I want to walk more for exercises, stay active Healthy diet Stay hydrated        This is a list of the screening recommended for you and due dates:  Health Maintenance  Topic Date Due   COVID-19 Vaccine (4 - Pfizer risk series) 10/18/2022*   Complete foot exam   11/23/2022*   DEXA scan (bone density measurement)  11/23/2022*   Zoster (Shingles) Vaccine (1 of 2) 01/02/2023*   Tetanus Vaccine  10/03/2023*   Eye exam for diabetics  12/14/2022   Hemoglobin A1C  02/26/2023   Pneumonia Vaccine  Completed   Flu Shot  Completed   HPV Vaccine  Aged Out   Mammogram  Discontinued  *Topic was postponed. The date shown is not the original due date.    Advanced directives: on file  Conditions/risks identified: none new  Next appointment: Follow up in one year for your annual wellness visit    Preventive Care 65 Years and Older, Female Preventive care refers to lifestyle choices and visits with your health care provider that can promote health and wellness. What does preventive care include? A yearly physical exam. This is also called an annual well check. Dental exams once or twice a year. Routine eye exams. Ask your health care provider how often you should have your eyes checked. Personal lifestyle choices, including: Daily care of your teeth and gums. Regular physical activity. Eating a healthy diet. Avoiding tobacco and drug use. Limiting alcohol use. Practicing safe sex. Taking low-dose aspirin every day. Taking vitamin and mineral supplements as recommended by your health care provider. What happens during an annual well check? The services and screenings done by  your health care provider during your annual well check will depend on your age, overall health, lifestyle risk factors, and family history of disease. Counseling  Your health care provider may ask you questions about your: Alcohol use. Tobacco use. Drug use. Emotional well-being. Home and relationship well-being. Sexual activity. Eating habits. History of falls. Memory and ability to understand (cognition). Work and work Statistician. Reproductive health. Screening  You may have the following tests or measurements: Height, weight, and BMI. Blood pressure. Lipid and cholesterol levels. These may be checked every 5 years, or more frequently if you are over 8 years old. Skin check. Lung cancer screening. You may have this screening every year starting at age 62 if you have a 30-pack-year history of smoking and currently smoke or have quit within the past 15 years. Fecal occult blood test (FOBT) of the stool. You may have this test every year starting at age 14. Flexible sigmoidoscopy or colonoscopy. You may have a sigmoidoscopy every 5 years or a colonoscopy every 10 years starting at age 78. Hepatitis C blood test. Hepatitis B blood test. Sexually transmitted disease (STD) testing. Diabetes screening. This is done by checking your blood sugar (glucose) after you have not eaten for a while (fasting). You may have this done every 1-3 years. Bone density scan. This is done to screen for osteoporosis. You may have this done starting at age 37. Mammogram. This may be done every 1-2 years.  Talk to your health care provider about how often you should have regular mammograms. Talk with your health care provider about your test results, treatment options, and if necessary, the need for more tests. Vaccines  Your health care provider may recommend certain vaccines, such as: Influenza vaccine. This is recommended every year. Tetanus, diphtheria, and acellular pertussis (Tdap, Td) vaccine. You  may need a Td booster every 10 years. Zoster vaccine. You may need this after age 44. Pneumococcal 13-valent conjugate (PCV13) vaccine. One dose is recommended after age 24. Pneumococcal polysaccharide (PPSV23) vaccine. One dose is recommended after age 98. Talk to your health care provider about which screenings and vaccines you need and how often you need them. This information is not intended to replace advice given to you by your health care provider. Make sure you discuss any questions you have with your health care provider. Document Released: 01/06/2016 Document Revised: 08/29/2016 Document Reviewed: 10/11/2015 Elsevier Interactive Patient Education  2017 [place] Prevention in the Home Falls can cause injuries. They can happen to people of all ages. There are many things you can do to make your home safe and to help prevent falls. What can I do on the outside of my home? Regularly fix the edges of walkways and driveways and fix any cracks. Remove anything that might make you trip as you walk through a door, such as a raised step or threshold. Trim any bushes or trees on the path to your home. Use bright outdoor lighting. Clear any walking paths of anything that might make someone trip, such as rocks or tools. Regularly check to see if handrails are loose or broken. Make sure that both sides of any steps have handrails. Any raised decks and porches should have guardrails on the edges. Have any leaves, snow, or ice cleared regularly. Use sand or salt on walking paths during winter. Clean up any spills in your garage right away. This includes oil or grease spills. What can I do in the bathroom? Use night lights. Install grab bars by the toilet and in the tub and shower. Do not use towel bars as grab bars. Use non-skid mats or decals in the tub or shower. If you need to sit down in the shower, use a plastic, non-slip stool. Keep the floor dry. Clean up any water that spills  on the floor as soon as it happens. Remove soap buildup in the tub or shower regularly. Attach bath mats securely with double-sided non-slip rug tape. Do not have throw rugs and other things on the floor that can make you trip. What can I do in the bedroom? Use night lights. Make sure that you have a light by your bed that is easy to reach. Do not use any sheets or blankets that are too big for your bed. They should not hang down onto the floor. Have a firm chair that has side arms. You can use this for support while you get dressed. Do not have throw rugs and other things on the floor that can make you trip. What can I do in the kitchen? Clean up any spills right away. Avoid walking on wet floors. Keep items that you use a lot in easy-to-reach places. If you need to reach something above you, use a strong step stool that has a grab bar. Keep electrical cords out of the way. Do not use floor polish or wax that makes floors slippery. If you must use wax, use non-skid floor wax.  Do not have throw rugs and other things on the floor that can make you trip. What can I do with my stairs? Do not leave any items on the stairs. Make sure that there are handrails on both sides of the stairs and use them. Fix handrails that are broken or loose. Make sure that handrails are as long as the stairways. Check any carpeting to make sure that it is firmly attached to the stairs. Fix any carpet that is loose or worn. Avoid having throw rugs at the top or bottom of the stairs. If you do have throw rugs, attach them to the floor with carpet tape. Make sure that you have a light switch at the top of the stairs and the bottom of the stairs. If you do not have them, ask someone to add them for you. What else can I do to help prevent falls? Wear shoes that: Do not have high heels. Have rubber bottoms. Are comfortable and fit you well. Are closed at the toe. Do not wear sandals. If you use a stepladder: Make  sure that it is fully opened. Do not climb a closed stepladder. Make sure that both sides of the stepladder are locked into place. Ask someone to hold it for you, if possible. Clearly mark and make sure that you can see: Any grab bars or handrails. First and last steps. Where the edge of each step is. Use tools that help you move around (mobility aids) if they are needed. These include: Canes. Walkers. Scooters. Crutches. Turn on the lights when you go into a dark area. Replace any light bulbs as soon as they burn out. Set up your furniture so you have a clear path. Avoid moving your furniture around. If any of your floors are uneven, fix them. If there are any pets around you, be aware of where they are. Review your medicines with your doctor. Some medicines can make you feel dizzy. This can increase your chance of falling. Ask your doctor what other things that you can do to help prevent falls. This information is not intended to replace advice given to you by your health care provider. Make sure you discuss any questions you have with your health care provider. Document Released: 10/06/2009 Document Revised: 05/17/2016 Document Reviewed: 01/14/2015 Elsevier Interactive Patient Education  2017 [employer].

## 2022-10-02 NOTE — Progress Notes (Signed)
Subjective:   Elizabeth [name] is a 88 y.o. female who presents for Medicare Annual (Subsequent) preventive examination.  Review of Systems    No ROS.  Medicare Wellness Virtual Visit.  Visual/audio telehealth visit, UTA vital signs.   See social history for additional risk factors.   Cardiac Risk Factors include: advanced age (>65men, >59 women);diabetes mellitus;hypertension     Objective:    Today's Vitals   10/02/22 1434  Weight: 145 lb (65.8 kg)  Height: 5\' 1"  (1.549 m)   Body mass index is 27.4 kg/m.     10/02/2022    2:49 PM 10/26/2021    6:25 PM 10/25/2021    8:08 PM 09/19/2021   12:15 PM 02/06/2021    1:04 PM 09/16/2020   12:59 PM 09/16/2019   12:55 PM  Advanced Directives  Does Patient Have a Medical Advance Directive? Yes Yes No Yes No Yes No  Type of 09/18/2019 of [place];Living will Living will;Healthcare Power of [place] Power of [place];Living will  Healthcare Power of Attorney   Does patient want to make changes to medical advance directive? No - Patient declined No - Patient declined  No - Patient declined No - Patient declined No - Patient declined   Copy of Healthcare Power of Attorney in Chart? Yes - validated most recent copy scanned in chart (See row information) Yes - validated most recent copy scanned in chart (See row information)  Yes - validated most recent copy scanned in chart (See row information)  No - copy requested   Would patient like information on creating a medical advance directive?       No - Patient declined    Current Medications (verified) Outpatient Encounter Medications as of 10/02/2022  Medication Sig   aspirin 81 MG tablet Take 81 mg by mouth daily.   azelastine (ASTELIN) 0.1 % nasal spray Place 1 spray into both nostrils 2 (two) times daily. Use in each nostril as directed   cetirizine (ZYRTEC) 5 MG tablet Take 5 mg by mouth daily as needed.    LIPITOR 40 MG tablet TAKE 1 TABLET BY MOUTH ONCE  DAILY   lisinopril (ZESTRIL) 40 MG tablet Take 1 tablet (40 mg total) by mouth daily.   meclizine (ANTIVERT) 12.5 MG tablet Take 2 tablets (25 mg total) by mouth 3 (three) times daily as needed for dizziness.   metoprolol tartrate (LOPRESSOR) 25 MG tablet TAKE 1 TABLET BY MOUTH TWICE DAILY   ondansetron (ZOFRAN) 4 MG tablet Take 1 tablet (4 mg total) by mouth daily as needed for nausea or vomiting.   triamcinolone (NASACORT) 55 MCG/ACT nasal inhaler Place 2 sprays into the nose daily as needed.    No facility-administered encounter medications on file as of 10/02/2022.    Allergies (verified) Patient has no known allergies.   History: Past Medical History:  Diagnosis Date   Cystocele or rectocele with incomplete uterine prolapse    Hyperlipidemia    Hypertension    Irritable bowel syndrome    Palpitations    Vaginal atrophy    Past Surgical History:  Procedure Laterality Date   CATARACT EXTRACTION  2006   Family History  Problem Relation Age of Onset   Transient ischemic attack Mother        multiple    Dementia Mother    Stroke Father    Heart disease Brother        MI - died age 38   Leukemia Brother  Lung cancer Brother    Throat cancer Brother        had heart disease also   Heart disease Sister    Dementia Sister    Social History   Socioeconomic History   Marital status: Widowed    Spouse name: Not on file   Number of children: 2   Years of education: Not on file   Highest education level: Not on file  Occupational History   Not on file  Tobacco Use   Smoking status: Never   Smokeless tobacco: Never  Vaping Use   Vaping Use: Never used  Substance and Sexual Activity   Alcohol use: No    Alcohol/week: 0.0 standard drinks of alcohol   Drug use: No   Sexual activity: Not Currently    Birth control/protection: None  Other Topics Concern   Not on file  Social History Narrative   Not on file   Social Determinants of Health   Financial Resource  Strain: Low Risk  (10/02/2022)   Overall Financial Resource Strain (CARDIA)    Difficulty of Paying Living Expenses: Not hard at all  Food Insecurity: No Food Insecurity (10/02/2022)   Hunger Vital Sign    Worried About Running Out of Food in the Last Year: Never true    Ran Out of Food in the Last Year: Never true  Transportation Needs: No Transportation Needs (10/02/2022)   PRAPARE - Hydrologist (Medical): No    Lack of Transportation (Non-Medical): No  Physical Activity: Insufficiently Active (10/02/2022)   Exercise Vital Sign    Days of Exercise per Week: 7 days    Minutes of Exercise per Session: 10 min  Stress: No Stress Concern Present (10/02/2022)   [place]    Feeling of Stress : Not at all  Social Connections: Unknown (10/02/2022)   Social Connection and Isolation Panel [NHANES]    Frequency of Communication with Friends and Family: More than three times a week    Frequency of Social Gatherings with Friends and Family: Not on file    Attends Religious Services: Not on file    Active Member of Clubs or Organizations: Not on file    Attends Archivist Meetings: Not on file    Marital Status: Widowed    Tobacco Counseling Counseling given: Not Answered   Clinical Intake:  Pre-visit preparation completed: Yes        Diabetes: Yes (Followed by PCP)  How often do you need to have someone help you when you read instructions, pamphlets, or other written materials from your doctor or pharmacy?: 2 - Rarely    Interpreter Needed?: No      Activities of Daily Living    10/02/2022    2:42 PM 10/26/2021    6:25 PM  In your present state of health, do you have any difficulty performing the following activities:  Hearing? 1 0  Comment Hearing aids   Vision? 0 0  Difficulty concentrating or making decisions? 0 0  Walking or climbing stairs? 0 0  Dressing or  bathing? 0 0  Doing errands, shopping? 0 0  Preparing Food and eating ? N   Using the Toilet? N   In the past six months, have you accidently leaked urine? Y   Comment Managed with panty liner   Do you have problems with loss of bowel control? N   Managing your Medications? N   Managing  your Finances? N   Housekeeping or managing your Housekeeping? N   Comment Monthly maid assist     Patient Care Team: Dale [place], MD as PCP - General (Internal Medicine) Antonieta Iba, MD as PCP - Cardiology (Cardiology)  Indicate any recent Medical Services you may have received from other than [facility] providers in the past year (date may be approximate).     Assessment:   This is a routine wellness examination for [place].  I connected with  Elana C Blowers on 10/02/22 by a audio enabled telemedicine application and verified that I am speaking with the correct person using two identifiers.  Patient Location: Home  Provider Location: Office/Clinic  I discussed the limitations of evaluation and management by telemedicine. The patient expressed understanding and agreed to proceed.   Hearing/Vision screen Hearing Screening - Comments:: Hearing aid, bilateral  Vision Screening - Comments:: Followed by [facility]  Wears corrective lenses when driving  Wears readers Cataract extraction, bilateral  They have regular follow up with the ophthalmologist  Dietary issues and exercise activities discussed: Current Exercise Habits: Home exercise routine, Type of exercise: stretching (Standing exercises. Post physical therapy balancing exercises.), Time (Minutes): 10, Frequency (Times/Week): 7, Weekly Exercise (Minutes/Week): 70, Intensity: Mild   Goals Addressed             This Visit's Progress    Maintain Healthy Lifestyle   On track    I want to walk more for exercises, stay active Healthy diet Stay hydrated       Depression Screen    10/02/2022    2:42 PM 09/03/2022     5:07 PM 06/01/2022   10:09 AM 09/19/2021   12:10 PM 06/19/2021   12:09 PM 09/16/2020   12:47 PM 09/16/2019   12:56 PM  PHQ 2/9 Scores  PHQ - 2 Score 0 0 1 0 0 0 0    Fall Risk    10/02/2022    2:42 PM 09/03/2022    5:07 PM 06/01/2022   10:08 AM 09/21/2021   11:45 AM 09/19/2021   12:31 PM  Fall Risk   Falls in the past year? 0 0 1 0   Number falls in past yr: 0 0 0 0   Injury with Fall? 0 0 0 0   Risk for fall due to :  No Fall Risks Impaired balance/gait;History of fall(s) No Fall Risks   Follow up Falls evaluation completed Falls evaluation completed Falls evaluation completed Falls evaluation completed Falls evaluation completed    FALL RISK PREVENTION PERTAINING TO THE HOME: Home free of loose throw rugs in walkways, pet beds, electrical cords, etc? Yes  Adequate lighting in your home to reduce risk of falls? Yes   ASSISTIVE DEVICES UTILIZED TO PREVENT FALLS: Life alert? Yes  Use of a cane, walker or w/c? No  Grab bars in the bathroom? Yes  Shower chair or bench in shower? Yes  Elevated toilet seat or a handicapped toilet? No   TIMED UP AND GO: Was the test performed? No .   Dexa Scan- deferred per patient.  Cognitive Function:    04/04/2021    7:10 AM 05/28/2018    8:52 AM  MMSE - Mini Mental State Exam  Orientation to time 4 5  Orientation to Place 5 5  Registration 3 3  Attention/ Calculation 5 5  Recall 3 3  Language- name 2 objects 2 2  Language- repeat 1 1  Language- follow 3 step command 3 3  Language- read & follow direction 1 1  Write a sentence 1 1  Copy design 1 1  Total score 29 30        10/02/2022    2:59 PM 09/19/2021   12:35 PM 09/16/2019    1:02 PM 05/27/2017    8:36 AM  6CIT Screen  What Year? 0 points 0 points 0 points 0 points  What month? 0 points 0 points 0 points 0 points  What time? 0 points 0 points 0 points 0 points  Count back from 20 0 points  0 points 0 points  Months in reverse 0 points  0 points 0 points  Repeat phrase 0 points   0 points 0 points  Total Score 0 points  0 points 0 points    Immunizations Immunization History  Administered Date(s) Administered   Fluad Quad(high Dose 65+) 10/15/2019, 09/21/2021, 09/03/2022   Influenza Split 10/19/2009, 10/13/2013, 09/30/2014   Influenza, High Dose Seasonal PF 09/28/2016, 10/30/2017, 11/04/2018   Influenza-Unspecified 10/24/2015, 09/20/2020   PFIZER(Purple Top)SARS-COV-2 Vaccination 01/25/2020, 02/15/2020, 03/14/2021   Pneumococcal Conjugate-13 03/11/2014   Pneumococcal Polysaccharide-23 05/31/2017   TDAP status: Due, Education has been provided regarding the importance of this vaccine. Advised may receive this vaccine at local pharmacy or Health Dept. Aware to provide a copy of the vaccination record if obtained from local pharmacy or Health Dept. Verbalized acceptance and understanding.  Covid-19 vaccine status: Completed vaccines x3.  Shingrix Completed?: No.    Education has been provided regarding the importance of this vaccine. Patient has been advised to call insurance company to determine out of pocket expense if they have not yet received this vaccine. Advised may also receive vaccine at local pharmacy or Health Dept. Verbalized acceptance and understanding.   Screening Tests Health Maintenance  Topic Date Due   COVID-19 Vaccine (4 - Pfizer risk series) 10/18/2022 (Originally 05/09/2021)   FOOT EXAM  11/23/2022 (Originally 06/19/2022)   DEXA SCAN  11/23/2022 (Originally 01/26/1994)   Zoster Vaccines- Shingrix (1 of 2) 01/02/2023 (Originally 01/27/1946)   TETANUS/TDAP  10/03/2023 (Originally 01/27/1946)   OPHTHALMOLOGY EXAM  12/14/2022   HEMOGLOBIN A1C  02/26/2023   Pneumonia Vaccine 3065+ Years old  Completed   INFLUENZA VACCINE  Completed   HPV VACCINES  Aged Out   MAMMOGRAM  Discontinued    Health Maintenance  There are no preventive care reminders to display for this patient.  Lung Cancer Screening: (Low Dose CT Chest recommended if Age 58-80 years,  30 pack-year currently smoking OR have quit w/in 15years.) does not qualify.   Hepatitis C Screening: does not qualify  Vision Screening: Recommended annual ophthalmology exams for early detection of glaucoma and other disorders of the eye.  Dental Screening: Recommended annual dental exams for proper oral hygiene  Community Resource Referral / Chronic Care Management: CRR required this visit?  No   CCM required this visit?  No      Plan:     I have personally reviewed and noted the following in the patient's chart:   Medical and social history Use of alcohol, tobacco or illicit drugs  Current medications and supplements including opioid prescriptions. Patient is not currently taking opioid prescriptions. Functional ability and status Nutritional status Physical activity Advanced directives List of other physicians Hospitalizations, surgeries, and ER visits in previous 12 months Vitals Screenings to include cognitive, depression, and falls Referrals and appointments  In addition, I have reviewed and discussed with patient certain preventive protocols, quality metrics, and best practice recommendations.  A written personalized care plan for preventive services as well as general preventive health recommendations were provided to patient.     Ashok Pall, LPN   [id number]/12/[id number]

## 2022-11-12 ENCOUNTER — Other Ambulatory Visit: Payer: Self-pay | Admitting: Cardiovascular Disease

## 2022-12-03 ENCOUNTER — Other Ambulatory Visit (INDEPENDENT_AMBULATORY_CARE_PROVIDER_SITE_OTHER): Payer: Medicare Other

## 2022-12-03 DIAGNOSIS — E7800 Pure hypercholesterolemia, unspecified: Secondary | ICD-10-CM

## 2022-12-03 DIAGNOSIS — I10 Essential (primary) hypertension: Secondary | ICD-10-CM

## 2022-12-03 DIAGNOSIS — E1165 Type 2 diabetes mellitus with hyperglycemia: Secondary | ICD-10-CM | POA: Diagnosis not present

## 2022-12-03 LAB — CBC WITH DIFFERENTIAL/PLATELET
Basophils Absolute: 0.1 10*3/uL (ref 0.0–0.1)
Basophils Relative: 1.2 % (ref 0.0–3.0)
Eosinophils Absolute: 0.2 10*3/uL (ref 0.0–0.7)
Eosinophils Relative: 2 % (ref 0.0–5.0)
HCT: 42.8 % (ref 36.0–46.0)
Hemoglobin: 14.4 g/dL (ref 12.0–15.0)
Lymphocytes Relative: 40 % (ref 12.0–46.0)
Lymphs Abs: 3.3 10*3/uL (ref 0.7–4.0)
MCHC: 33.6 g/dL (ref 30.0–36.0)
MCV: 92.5 fl (ref 78.0–100.0)
Monocytes Absolute: 0.6 10*3/uL (ref 0.1–1.0)
Monocytes Relative: 7.3 % (ref 3.0–12.0)
Neutro Abs: 4 10*3/uL (ref 1.4–7.7)
Neutrophils Relative %: 49.5 % (ref 43.0–77.0)
Platelets: 243 10*3/uL (ref 150.0–400.0)
RBC: 4.63 Mil/uL (ref 3.87–5.11)
RDW: 14.1 % (ref 11.5–15.5)
WBC: 8.2 10*3/uL (ref 4.0–10.5)

## 2022-12-03 LAB — LIPID PANEL
Cholesterol: 183 mg/dL (ref 0–200)
HDL: 53.5 mg/dL (ref >39.00)
LDL Cholesterol: 98 mg/dL (ref 0–99)
NonHDL: 129.5
Total CHOL/HDL Ratio: 3
Triglycerides: 156 mg/dL — ABNORMAL HIGH (ref 0.0–149.0)
VLDL: 31.2 mg/dL (ref 0.0–40.0)

## 2022-12-03 LAB — BASIC METABOLIC PANEL
BUN: 29 mg/dL — ABNORMAL HIGH (ref 6–23)
CO2: 21 mEq/L (ref 19–32)
Calcium: 9.3 mg/dL (ref 8.4–10.5)
Chloride: 108 mEq/L (ref 96–112)
Creatinine, Ser: 1.27 mg/dL — ABNORMAL HIGH (ref 0.40–1.20)
GFR: 36.37 mL/min — ABNORMAL LOW (ref >60.00)
Glucose, Bld: 98 mg/dL (ref 70–99)
Potassium: 4.6 mEq/L (ref 3.5–5.1)
Sodium: 138 mEq/L (ref 135–145)

## 2022-12-03 LAB — HEPATIC FUNCTION PANEL
ALT: 21 U/L (ref 0–35)
AST: 22 U/L (ref 0–37)
Albumin: 4.5 g/dL (ref 3.5–5.2)
Alkaline Phosphatase: 64 U/L (ref 39–117)
Bilirubin, Direct: 0.1 mg/dL (ref 0.0–0.3)
Total Bilirubin: 0.5 mg/dL (ref 0.2–1.2)
Total Protein: 6.7 g/dL (ref 6.0–8.3)

## 2022-12-03 LAB — HEMOGLOBIN A1C: Hgb A1c MFr Bld: 7.1 % — ABNORMAL HIGH (ref 4.6–6.5)

## 2022-12-06 ENCOUNTER — Ambulatory Visit (INDEPENDENT_AMBULATORY_CARE_PROVIDER_SITE_OTHER): Payer: Medicare Other | Admitting: Internal Medicine

## 2022-12-06 ENCOUNTER — Encounter: Payer: Self-pay | Admitting: Internal Medicine

## 2022-12-06 VITALS — BP 144/72 | HR 69 | Temp 98.0°F | Resp 15 | Ht 61.0 in | Wt 144.2 lb

## 2022-12-06 DIAGNOSIS — I10 Essential (primary) hypertension: Secondary | ICD-10-CM | POA: Diagnosis not present

## 2022-12-06 DIAGNOSIS — E1165 Type 2 diabetes mellitus with hyperglycemia: Secondary | ICD-10-CM

## 2022-12-06 DIAGNOSIS — I700 Atherosclerosis of aorta: Secondary | ICD-10-CM

## 2022-12-06 DIAGNOSIS — E2839 Other primary ovarian failure: Secondary | ICD-10-CM

## 2022-12-06 DIAGNOSIS — R42 Dizziness and giddiness: Secondary | ICD-10-CM

## 2022-12-06 DIAGNOSIS — Z9109 Other allergy status, other than to drugs and biological substances: Secondary | ICD-10-CM

## 2022-12-06 DIAGNOSIS — E7800 Pure hypercholesterolemia, unspecified: Secondary | ICD-10-CM | POA: Diagnosis not present

## 2022-12-06 DIAGNOSIS — N1831 Chronic kidney disease, stage 3a: Secondary | ICD-10-CM

## 2022-12-06 DIAGNOSIS — H9190 Unspecified hearing loss, unspecified ear: Secondary | ICD-10-CM

## 2022-12-06 DIAGNOSIS — F439 Reaction to severe stress, unspecified: Secondary | ICD-10-CM

## 2022-12-06 MED ORDER — LIPITOR 40 MG PO TABS: 40.0000 mg | ORAL_TABLET | Freq: Every day | ORAL | 3 refills | Status: DC

## 2022-12-06 NOTE — Progress Notes (Signed)
Subjective:    Patient ID: Elizabeth [name], female    DOB: April 03, 1927, 89 y.o.   MRN: [id number]  Patient here for  Chief Complaint  Patient presents with   Annual Exam    CPE    HPI Has had persistent problems with vertigo, balance and dizziness.  Previous negative evaluation for BPV. Has seen neurology. Concerned regarding vestibular migraines. Recommended continuing vestibular exercises at home. Previous MRI unrevealing. Symptoms vary. Has seen ENT.  Still doing her exercises.  Overall symptoms have improved, but is persistent.  Discussed.  No chest pain or increased sob reported.  No abdominal pain or bowel change reported.  Moving - [place].  Increased stress. Discussed.  Notify if feels needs any further intervention.    Past Medical History:  Diagnosis Date   Cystocele or rectocele with incomplete uterine prolapse    Hyperlipidemia    Hypertension    Irritable bowel syndrome    Palpitations    Vaginal atrophy    Past Surgical History:  Procedure Laterality Date   CATARACT EXTRACTION  2006   Family History  Problem Relation Age of Onset   Transient ischemic attack Mother        multiple    Dementia Mother    Stroke Father    Heart disease Brother        MI - died age 29   Leukemia Brother    Lung cancer Brother    Throat cancer Brother        had heart disease also   Heart disease Sister    Dementia Sister    Social History   Socioeconomic History   Marital status: Widowed    Spouse name: Not on file   Number of children: 2   Years of education: Not on file   Highest education level: Not on file  Occupational History   Not on file  Tobacco Use   Smoking status: Never   Smokeless tobacco: Never  Vaping Use   Vaping Use: Never used  Substance and Sexual Activity   Alcohol use: No    Alcohol/week: 0.0 standard drinks of alcohol   Drug use: No   Sexual activity: Not Currently    Birth control/protection: None  Other Topics Concern   Not on file   Social History Narrative   Not on file   Social Determinants of Health   Financial Resource Strain: Low Risk  (10/02/2022)   Overall Financial Resource Strain (CARDIA)    Difficulty of Paying Living Expenses: Not hard at all  Food Insecurity: No Food Insecurity (10/02/2022)   Hunger Vital Sign    Worried About Running Out of Food in the Last Year: Never true    Ran Out of Food in the Last Year: Never true  Transportation Needs: No Transportation Needs (10/02/2022)   PRAPARE - Hydrologist (Medical): No    Lack of Transportation (Non-Medical): No  Physical Activity: Insufficiently Active (10/02/2022)   Exercise Vital Sign    Days of Exercise per Week: 7 days    Minutes of Exercise per Session: 10 min  Stress: No Stress Concern Present (10/02/2022)   [place]    Feeling of Stress : Not at all  Social Connections: Unknown (10/02/2022)   Social Connection and Isolation Panel [NHANES]    Frequency of Communication with Friends and Family: More than three times a week    Frequency of Social Gatherings  with Friends and Family: Not on file    Attends Religious Services: Not on file    Active Member of Clubs or Organizations: Not on file    Attends Club or Organization Meetings: Not on file    Marital Status: Widowed     Review of Systems  Constitutional:  Negative for appetite change and unexpected weight change.  HENT:  Negative for congestion, sinus pressure and sore throat.   Eyes:  Negative for pain and visual disturbance.  Respiratory:  Negative for cough and chest tightness.        No increased sob.   Cardiovascular:  Negative for chest pain, palpitations and leg swelling.  Gastrointestinal:  Negative for abdominal pain, diarrhea, nausea and vomiting.  Genitourinary:  Negative for difficulty urinating and dysuria.  Musculoskeletal:  Negative for joint swelling and myalgias.   Skin:  Negative for color change and rash.  Neurological:  Negative for headaches.       Unsteadiness - persistent issue.  Has overall improved.   Hematological:  Negative for adenopathy. Does not bruise/bleed easily.  Psychiatric/Behavioral:  Negative for agitation and dysphoric mood.        Objective:     BP (!) 144/72 (BP Location: Left Arm, Patient Position: Sitting, Cuff Size: Small)   Pulse 69   Temp 98 F (36.7 C) (Temporal)   Resp 15   Ht _0  (1.549 m)   Wt 144 lb 3.2 oz (65.4 kg)   SpO2 99%   BMI 27.25 kg/m  Wt Readings from Last 3 Encounters:  12/06/22 144 lb 3.2 oz (65.4 kg)  10/02/22 145 lb (65.8 kg)  09/03/22 145 lb (65.8 kg)    Physical Exam Vitals reviewed.  Constitutional:      General: She is not in acute distress.    Appearance: Normal appearance. She is well-developed.  HENT:     Head: Normocephalic and atraumatic.     Right Ear: External ear normal.     Left Ear: External ear normal.  Eyes:     General: No scleral icterus.       Right eye: No discharge.        Left eye: No discharge.     Conjunctiva/sclera: Conjunctivae normal.  Neck:     Thyroid: No thyromegaly.  Cardiovascular:     Rate and Rhythm: Normal rate and regular rhythm.  Pulmonary:     Effort: No tachypnea, accessory muscle usage or respiratory distress.     Breath sounds: Normal breath sounds. No decreased breath sounds or wheezing.  Chest:  Breasts:    Right: No inverted nipple, mass, nipple discharge or tenderness (no axillary adenopathy).     Left: No inverted nipple, mass, nipple discharge or tenderness (no axilarry adenopathy).  Abdominal:     General: Bowel sounds are normal.     Palpations: Abdomen is soft.     Tenderness: There is no abdominal tenderness.  Musculoskeletal:        General: No swelling or tenderness.     Cervical back: Neck supple.  Lymphadenopathy:     Cervical: No cervical adenopathy.  Skin:    Findings: No erythema or rash.  Neurological:      Mental Status: She is alert and oriented to person, place, and time.  Psychiatric:        Mood and Affect: Mood normal.        Behavior: Behavior normal.      Outpatient Encounter Medications as of 12/06/2022  Medication Sig  aspirin 81 MG tablet Take 81 mg by mouth daily.   azelastine (ASTELIN) 0.1 % nasal spray Place 1 spray into both nostrils 2 (two) times daily. Use in each nostril as directed   cetirizine (ZYRTEC) 5 MG tablet Take 5 mg by mouth daily as needed.    lisinopril (ZESTRIL) 40 MG tablet Take 1 tablet (40 mg total) by mouth daily.   meclizine (ANTIVERT) 12.5 MG tablet Take 2 tablets (25 mg total) by mouth 3 (three) times daily as needed for dizziness.   metoprolol tartrate (LOPRESSOR) 25 MG tablet TAKE 1 TABLET BY MOUTH TWICE DAILY   triamcinolone (NASACORT) 55 MCG/ACT nasal inhaler Place 2 sprays into the nose daily as needed.    [DISCONTINUED] LIPITOR 40 MG tablet TAKE 1 TABLET BY MOUTH ONCE DAILY   LIPITOR 40 MG tablet Take 1 tablet (40 mg total) by mouth daily.   No facility-administered encounter medications on file as of 12/06/2022.     Lab Results  Component Value Date   WBC 8.2 12/03/2022   HGB 14.4 12/03/2022   HCT 42.8 12/03/2022   PLT 243.0 12/03/2022   GLUCOSE 98 12/03/2022   CHOL 183 12/03/2022   TRIG 156.0 (H) 12/03/2022   HDL 53.50 12/03/2022   LDLDIRECT 178.4 11/04/2013   LDLCALC 98 12/03/2022   ALT 21 12/03/2022   AST 22 12/03/2022   NA 138 12/03/2022   K 4.6 12/03/2022   CL 108 12/03/2022   CREATININE 1.27 (H) 12/03/2022   BUN 29 (H) 12/03/2022   CO2 21 12/03/2022   TSH 4.51 03/27/2022   HGBA1C 7.1 (H) 12/03/2022   MICROALBUR 3.3 (H) 08/31/2022    MR BRAIN WO CONTRAST  Result Date: 10/26/2021 CLINICAL DATA:  88 year old female with vertigo, nausea and vomiting for 2 days. EXAM: MRI HEAD WITHOUT CONTRAST TECHNIQUE: Multiplanar, multiecho pulse sequences of the brain and surrounding structures were obtained without intravenous  contrast. COMPARISON:  Head CT without contrast 2358 hours last night. Brain MRI 05/01/2021. FINDINGS: Brain: No restricted diffusion to suggest acute infarction. No midline shift, mass effect, evidence of mass lesion, ventriculomegaly, extra-axial collection or acute intracranial hemorrhage. Cervicomedullary junction and pituitary are within normal limits. Scattered and patchy but overall mild for age bilateral cerebral white matter T2 and FLAIR hyperintensity is stable since May. No cortical encephalomalacia identified. No definite chronic cerebral blood products. Deep gray matter nuclei, brainstem and cerebellum remain normal for age. Vascular: Major intracranial vascular flow voids are stable. Skull and upper cervical spine: Negative. Sinuses/Orbits: Stable, negative. Other: Mastoids remain clear. Stable and grossly normal visible internal auditory structures. Stylomastoid foramina and parotid glands appear normal. IMPRESSION: 1. No acute intracranial abnormality. 2. Stable since May and largely normal for age noncontrast MRI appearance of the brain. Electronically Signed   By: Genevie Ann M.D.   On: 10/26/2021 05:17   CT Head Wo Contrast  Result Date: 10/26/2021 CLINICAL DATA:  Dizziness. EXAM: CT HEAD WITHOUT CONTRAST TECHNIQUE: Contiguous axial images were obtained from the base of the skull through the vertex without intravenous contrast. COMPARISON:  None. FINDINGS: Brain: There is mild cerebral atrophy with widening of the extra-axial spaces and ventricular dilatation. There are areas of decreased attenuation within the white matter tracts of the supratentorial brain, consistent with microvascular disease changes. Vascular: No hyperdense vessel or unexpected calcification. Skull: Normal. Negative for fracture or focal lesion. Sinuses/Orbits: No acute finding. Other: None. IMPRESSION: 1. Generalized cerebral atrophy. 2. No acute intracranial abnormality. Electronically Signed   By: Hoover Browns  [place] M.D.    On: 10/26/2021 00:09       Assessment & Plan:  Estrogen deficiency  Primary hypertension Assessment & Plan: On lisinopril and metoprolol.  Spot check pressures.  Follow metabolic panel.   Orders: -     Basic metabolic panel; Future  Type 2 diabetes mellitus with hyperglycemia, without long-term current use of insulin (HCC) Assessment & Plan: Low carb diet and exercise given elevated blood glucose. Follow met b and a1c.   Orders: -     Hemoglobin A1c; Future  Hypercholesterolemia Assessment & Plan: On lipitor.  Low cholesterol diet and exercise.  Follow lipid panel and liver function tests.   Orders: -     Lipid panel; Future -     Hepatic function panel; Future  Aortic atherosclerosis (HCC) Assessment & Plan: Continue lipitor and aspirin.     Stage 3a chronic kidney disease (HCC) Assessment & Plan:  Avoid antiinflammatories.  Continue to stay hydrated.  Follow metabolic panel.    Decreased hearing, unspecified laterality Assessment & Plan: Hearing aids.    Dizziness Assessment & Plan: Has had persistent problems with vertigo, imbalance and dizziness.  Previous negative evaluation for BPV.  Has seen neurology.  Concerned regarding vestibular migraines.  Recommended continuing vestibular exercises at home.  Previous MRI unrevealing.  Symptoms vary.  Saw Dr Richardson Landry (ENT) - 09/2022 - no change.  Follow.     Environmental allergies Assessment & Plan: Stable - zyrtec/nasacort.  Follow.    Stress Assessment & Plan: Increased stress.  Discussed.  Will notify me if feels needs further intervention.  Follow.    Other orders -     Lipitor; Take 1 tablet (40 mg total) by mouth daily.  Dispense: 90 tablet; Refill: 3     Einar Pheasant, MD

## 2022-12-20 ENCOUNTER — Encounter: Payer: Self-pay | Admitting: Internal Medicine

## 2022-12-20 NOTE — Assessment & Plan Note (Signed)
Hearing aids 

## 2022-12-20 NOTE — Assessment & Plan Note (Signed)
Has had persistent problems with vertigo, imbalance and dizziness.  Previous negative evaluation for BPV.  Has seen neurology.  Concerned regarding vestibular migraines.  Recommended continuing vestibular exercises at home.  Previous MRI unrevealing.  Symptoms vary.  Saw Dr Willeen Cass (ENT) - 09/2022 - no change.  Follow.

## 2022-12-20 NOTE — Assessment & Plan Note (Signed)
Avoid antiinflammatories.  Continue to stay hydrated.  Follow metabolic panel.  

## 2022-12-20 NOTE — Assessment & Plan Note (Signed)
On lipitor.  Low cholesterol diet and exercise.  Follow lipid panel and liver function tests.   

## 2022-12-20 NOTE — Assessment & Plan Note (Signed)
Low carb diet and exercise given elevated blood glucose. Follow met b and a1c.

## 2022-12-20 NOTE — Assessment & Plan Note (Signed)
Stable - zyrtec/nasacort.  Follow.  

## 2022-12-20 NOTE — Assessment & Plan Note (Signed)
Increased stress.  Discussed.  Will notify me if feels needs further intervention.  Follow.

## 2022-12-20 NOTE — Assessment & Plan Note (Signed)
Continue lipitor and aspirin.  ?

## 2022-12-20 NOTE — Assessment & Plan Note (Signed)
On lisinopril and metoprolol.  Spot check pressures.  Follow metabolic panel.  

## 2023-03-07 ENCOUNTER — Other Ambulatory Visit (INDEPENDENT_AMBULATORY_CARE_PROVIDER_SITE_OTHER): Payer: Medicare Other

## 2023-03-07 DIAGNOSIS — E1165 Type 2 diabetes mellitus with hyperglycemia: Secondary | ICD-10-CM

## 2023-03-07 DIAGNOSIS — E7800 Pure hypercholesterolemia, unspecified: Secondary | ICD-10-CM | POA: Diagnosis not present

## 2023-03-07 DIAGNOSIS — I10 Essential (primary) hypertension: Secondary | ICD-10-CM

## 2023-03-07 LAB — LDL CHOLESTEROL, DIRECT: Direct LDL: 201 mg/dL

## 2023-03-07 LAB — LIPID PANEL
Cholesterol: 297 mg/dL — ABNORMAL HIGH (ref 0–200)
HDL: 51.9 mg/dL (ref >39.00)
NonHDL: 245
Total CHOL/HDL Ratio: 6
Triglycerides: 252 mg/dL — ABNORMAL HIGH (ref 0.0–149.0)
VLDL: 50.4 mg/dL — ABNORMAL HIGH (ref 0.0–40.0)

## 2023-03-07 LAB — HEPATIC FUNCTION PANEL
ALT: 15 U/L (ref 0–35)
AST: 19 U/L (ref 0–37)
Albumin: 4 g/dL (ref 3.5–5.2)
Alkaline Phosphatase: 54 U/L (ref 39–117)
Bilirubin, Direct: 0.1 mg/dL (ref 0.0–0.3)
Total Bilirubin: 0.4 mg/dL (ref 0.2–1.2)
Total Protein: 6.4 g/dL (ref 6.0–8.3)

## 2023-03-07 LAB — BASIC METABOLIC PANEL
BUN: 27 mg/dL — ABNORMAL HIGH (ref 6–23)
CO2: 22 mEq/L (ref 19–32)
Calcium: 9 mg/dL (ref 8.4–10.5)
Chloride: 106 mEq/L (ref 96–112)
Creatinine, Ser: 1.27 mg/dL — ABNORMAL HIGH (ref 0.40–1.20)
GFR: 36.3 mL/min — ABNORMAL LOW (ref >60.00)
Glucose, Bld: 90 mg/dL (ref 70–99)
Potassium: 4.9 mEq/L (ref 3.5–5.1)
Sodium: 136 mEq/L (ref 135–145)

## 2023-03-07 LAB — HEMOGLOBIN A1C: Hgb A1c MFr Bld: 6.6 % — ABNORMAL HIGH (ref 4.6–6.5)

## 2023-03-11 ENCOUNTER — Encounter: Payer: Self-pay | Admitting: Internal Medicine

## 2023-03-11 ENCOUNTER — Ambulatory Visit: Payer: Medicare Other | Admitting: Internal Medicine

## 2023-03-11 VITALS — BP 128/88 | HR 72 | Temp 97.9°F | Resp 16 | Ht 61.0 in | Wt 142.0 lb

## 2023-03-11 DIAGNOSIS — I471 Supraventricular tachycardia, unspecified: Secondary | ICD-10-CM

## 2023-03-11 DIAGNOSIS — E1165 Type 2 diabetes mellitus with hyperglycemia: Secondary | ICD-10-CM

## 2023-03-11 DIAGNOSIS — I700 Atherosclerosis of aorta: Secondary | ICD-10-CM | POA: Diagnosis not present

## 2023-03-11 DIAGNOSIS — N1831 Chronic kidney disease, stage 3a: Secondary | ICD-10-CM

## 2023-03-11 DIAGNOSIS — Z9109 Other allergy status, other than to drugs and biological substances: Secondary | ICD-10-CM

## 2023-03-11 DIAGNOSIS — I10 Essential (primary) hypertension: Secondary | ICD-10-CM | POA: Diagnosis not present

## 2023-03-11 DIAGNOSIS — E7800 Pure hypercholesterolemia, unspecified: Secondary | ICD-10-CM

## 2023-03-11 DIAGNOSIS — R42 Dizziness and giddiness: Secondary | ICD-10-CM

## 2023-03-11 DIAGNOSIS — F439 Reaction to severe stress, unspecified: Secondary | ICD-10-CM

## 2023-03-11 MED ORDER — ATORVASTATIN CALCIUM 40 MG PO TABS: 40.0000 mg | ORAL_TABLET | Freq: Every day | ORAL | 3 refills | Status: DC

## 2023-03-11 NOTE — Progress Notes (Signed)
Subjective:    Patient ID: Elizabeth [name], female    DOB: Aug 06, 1927, 89 y.o.   MRN: [id number]  Patient here for  Chief Complaint  Patient presents with   Medical Management of Chronic Issues    HPI Here to follow up regarding hypercholesterolemia and hypertension.  Increased stress.  Recently moved to the [place] [facility]). Increased stress related to the move.  Discussed.  Has good family support.  Tries to stay active.  Not as active.  Eating.  No nausea or vomiting.  Some persistent allergy issues. No significant cough.  No chest congestion.  Has been off her cholesterol medication x 1 month.  Could not get - issues with generic medication per pt.  Discussed restarting.  No abdominal pain reported.     Past Medical History:  Diagnosis Date   Cystocele or rectocele with incomplete uterine prolapse    Hyperlipidemia    Hypertension    Irritable bowel syndrome    Palpitations    Vaginal atrophy    Past Surgical History:  Procedure Laterality Date   CATARACT EXTRACTION  2006   Family History  Problem Relation Age of Onset   Transient ischemic attack Mother        multiple    Dementia Mother    Stroke Father    Heart disease Brother        MI - died age 1   Leukemia Brother    Lung cancer Brother    Throat cancer Brother        had heart disease also   Heart disease Sister    Dementia Sister    Social History   Socioeconomic History   Marital status: Widowed    Spouse name: Not on file   Number of children: 2   Years of education: Not on file   Highest education level: Not on file  Occupational History   Not on file  Tobacco Use   Smoking status: Never   Smokeless tobacco: Never  Vaping Use   Vaping Use: Never used  Substance and Sexual Activity   Alcohol use: No    Alcohol/week: 0.0 standard drinks of alcohol   Drug use: No   Sexual activity: Not Currently    Birth control/protection: None  Other Topics Concern   Not on file  Social History  Narrative   Not on file   Social Determinants of Health   Financial Resource Strain: Low Risk  (10/02/2022)   Overall Financial Resource Strain (CARDIA)    Difficulty of Paying Living Expenses: Not hard at all  Food Insecurity: No Food Insecurity (10/02/2022)   Hunger Vital Sign    Worried About Running Out of Food in the Last Year: Never true    Ran Out of Food in the Last Year: Never true  Transportation Needs: No Transportation Needs (10/02/2022)   PRAPARE - Hydrologist (Medical): No    Lack of Transportation (Non-Medical): No  Physical Activity: Insufficiently Active (10/02/2022)   Exercise Vital Sign    Days of Exercise per Week: 7 days    Minutes of Exercise per Session: 10 min  Stress: No Stress Concern Present (10/02/2022)   [place]    Feeling of Stress : Not at all  Social Connections: Unknown (10/02/2022)   Social Connection and Isolation Panel [NHANES]    Frequency of Communication with Friends and Family: More than three times a week  Frequency of Social Gatherings with Friends and Family: Not on file    Attends Religious Services: Not on file    Active Member of Clubs or Organizations: Not on file    Attends Archivist Meetings: Not on file    Marital Status: Widowed     Review of Systems  Constitutional:  Negative for appetite change and unexpected weight change.  HENT:  Negative for sinus pressure.        Allergy symptoms - watery eyes.  Runny nose.   Respiratory:  Negative for cough, chest tightness and shortness of breath.   Cardiovascular:  Negative for chest pain and leg swelling.  Gastrointestinal:  Negative for abdominal pain, diarrhea, nausea and vomiting.  Genitourinary:  Negative for difficulty urinating and dysuria.  Musculoskeletal:  Negative for joint swelling and myalgias.  Skin:  Negative for color change and rash.  Neurological:   Negative for dizziness and headaches.  Psychiatric/Behavioral:  Negative for agitation and dysphoric mood.        Increased stress as outlined.         Objective:     BP 128/88   Pulse 72   Temp 97.9 F (36.6 C)   Resp 16   Ht 5\' 1"  (1.549 m)   Wt 142 lb (64.4 kg)   SpO2 98%   BMI 26.83 kg/m  Wt Readings from Last 3 Encounters:  03/11/23 142 lb (64.4 kg)  12/06/22 144 lb 3.2 oz (65.4 kg)  10/02/22 145 lb (65.8 kg)    Physical Exam Vitals reviewed.  Constitutional:      General: She is not in acute distress.    Appearance: Normal appearance.  HENT:     Head: Normocephalic and atraumatic.     Right Ear: External ear normal.     Left Ear: External ear normal.  Eyes:     General: No scleral icterus.       Right eye: No discharge.        Left eye: No discharge.     Conjunctiva/sclera: Conjunctivae normal.  Neck:     Thyroid: No thyromegaly.  Cardiovascular:     Rate and Rhythm: Normal rate and regular rhythm.  Pulmonary:     Effort: No respiratory distress.     Breath sounds: Normal breath sounds. No wheezing.  Abdominal:     General: Bowel sounds are normal.     Palpations: Abdomen is soft.     Tenderness: There is no abdominal tenderness.  Musculoskeletal:        General: No swelling or tenderness.     Cervical back: Neck supple. No tenderness.  Lymphadenopathy:     Cervical: No cervical adenopathy.  Skin:    Findings: No erythema or rash.  Neurological:     Mental Status: She is alert.  Psychiatric:        Mood and Affect: Mood normal.        Behavior: Behavior normal.      Outpatient Encounter Medications as of 03/11/2023  Medication Sig   atorvastatin (LIPITOR) 40 MG tablet Take 1 tablet (40 mg total) by mouth daily.   aspirin 81 MG tablet Take 81 mg by mouth daily.   azelastine (ASTELIN) 0.1 % nasal spray Place 1 spray into both nostrils 2 (two) times daily. Use in each nostril as directed   cetirizine (ZYRTEC) 5 MG tablet Take 5 mg by mouth  daily as needed.    lisinopril (ZESTRIL) 40 MG tablet Take 1 tablet (40 mg  total) by mouth daily.   meclizine (ANTIVERT) 12.5 MG tablet Take 2 tablets (25 mg total) by mouth 3 (three) times daily as needed for dizziness.   metoprolol tartrate (LOPRESSOR) 25 MG tablet TAKE 1 TABLET BY MOUTH TWICE DAILY   triamcinolone (NASACORT) 55 MCG/ACT nasal inhaler Place 2 sprays into the nose daily as needed.    [DISCONTINUED] LIPITOR 40 MG tablet Take 1 tablet (40 mg total) by mouth daily.   No facility-administered encounter medications on file as of 03/11/2023.     Lab Results  Component Value Date   WBC 8.2 12/03/2022   HGB 14.4 12/03/2022   HCT 42.8 12/03/2022   PLT 243.0 12/03/2022   GLUCOSE 90 03/07/2023   CHOL 297 (H) 03/07/2023   TRIG 252.0 (H) 03/07/2023   HDL 51.90 03/07/2023   LDLDIRECT 201.0 03/07/2023   LDLCALC 98 12/03/2022   ALT 15 03/07/2023   AST 19 03/07/2023   NA 136 03/07/2023   K 4.9 03/07/2023   CL 106 03/07/2023   CREATININE 1.27 (H) 03/07/2023   BUN 27 (H) 03/07/2023   CO2 22 03/07/2023   TSH 4.51 03/27/2022   HGBA1C 6.6 (H) 03/07/2023   MICROALBUR 3.3 (H) 08/31/2022    MR BRAIN WO CONTRAST  Result Date: 10/26/2021 CLINICAL DATA:  89 year old female with vertigo, nausea and vomiting for 2 days. EXAM: MRI HEAD WITHOUT CONTRAST TECHNIQUE: Multiplanar, multiecho pulse sequences of the brain and surrounding structures were obtained without intravenous contrast. COMPARISON:  Head CT without contrast 2358 hours last night. Brain MRI 05/01/2021. FINDINGS: Brain: No restricted diffusion to suggest acute infarction. No midline shift, mass effect, evidence of mass lesion, ventriculomegaly, extra-axial collection or acute intracranial hemorrhage. Cervicomedullary junction and pituitary are within normal limits. Scattered and patchy but overall mild for age bilateral cerebral white matter T2 and FLAIR hyperintensity is stable since May. No cortical encephalomalacia identified. No  definite chronic cerebral blood products. Deep gray matter nuclei, brainstem and cerebellum remain normal for age. Vascular: Major intracranial vascular flow voids are stable. Skull and upper cervical spine: Negative. Sinuses/Orbits: Stable, negative. Other: Mastoids remain clear. Stable and grossly normal visible internal auditory structures. Stylomastoid foramina and parotid glands appear normal. IMPRESSION: 1. No acute intracranial abnormality. 2. Stable since May and largely normal for age noncontrast MRI appearance of the brain. Electronically Signed   By: Genevie Ann M.D.   On: 10/26/2021 05:17   CT Head Wo Contrast  Result Date: 10/26/2021 CLINICAL DATA:  Dizziness. EXAM: CT HEAD WITHOUT CONTRAST TECHNIQUE: Contiguous axial images were obtained from the base of the skull through the vertex without intravenous contrast. COMPARISON:  None. FINDINGS: Brain: There is mild cerebral atrophy with widening of the extra-axial spaces and ventricular dilatation. There are areas of decreased attenuation within the white matter tracts of the supratentorial brain, consistent with microvascular disease changes. Vascular: No hyperdense vessel or unexpected calcification. Skull: Normal. Negative for fracture or focal lesion. Sinuses/Orbits: No acute finding. Other: None. IMPRESSION: 1. Generalized cerebral atrophy. 2. No acute intracranial abnormality. Electronically Signed   By: Virgina Norfolk M.D.   On: 10/26/2021 00:09       Assessment & Plan:  Hypercholesterolemia Assessment & Plan: Teressa Senter her back on lipitor.  Low cholesterol diet and exercise.  Follow lipid panel and liver function tests.   Orders: -     Lipid panel; Future -     Hepatic function panel; Future  Type 2 diabetes mellitus with hyperglycemia, without long-term current use of insulin ([place])  Assessment & Plan: Low carb diet and exercise given elevated blood glucose. Follow met b and a1c.   Orders: -     Hemoglobin A1c; Future  Primary  hypertension Assessment & Plan: On lisinopril and metoprolol.  Spot check pressures.  Follow metabolic panel.   Orders: -     Basic metabolic panel; Future -     TSH; Future  Aortic atherosclerosis (HCC) Assessment & Plan: Continue lipitor and aspirin.     Stage 3a chronic kidney disease (HCC) Assessment & Plan:  Avoid antiinflammatories.  Continue to stay hydrated.  Follow metabolic panel.    Dizziness Assessment & Plan: Has had persistent problems with vertigo, imbalance and dizziness.  Previous negative evaluation for BPV.  Has seen neurology.  Concerned regarding vestibular migraines.  Recommended continuing vestibular exercises at home.  Previous MRI unrevealing.  Symptoms vary.  Saw Dr Richardson Landry (ENT) - 09/2022 - no change.  Follow.  Overall improved.  Follow.    Environmental allergies Assessment & Plan: Stable - zyrtec/nasacort.  Follow.    Stress Assessment & Plan: Increased stress.  Discussed.  Recent move.  Will notify me if feels needs further intervention.  Has good family support. Follow.    SVT (supraventricular tachycardia) Assessment & Plan: Followed by cardiology.  Continue lopressor.  Overall stable.  Follow.    Other orders -     Atorvastatin Calcium; Take 1 tablet (40 mg total) by mouth daily.  Dispense: 90 tablet; Refill: 3     Einar Pheasant, MD

## 2023-03-17 ENCOUNTER — Encounter: Payer: Self-pay | Admitting: Internal Medicine

## 2023-03-17 NOTE — Assessment & Plan Note (Signed)
Continue lipitor and aspirin.  ?

## 2023-03-17 NOTE — Assessment & Plan Note (Signed)
Increased stress.  Discussed.  Recent move.  Will notify me if feels needs further intervention.  Has good family support. Follow.

## 2023-03-17 NOTE — Assessment & Plan Note (Signed)
On lisinopril and metoprolol.  Spot check pressures.  Follow metabolic panel.

## 2023-03-17 NOTE — Assessment & Plan Note (Signed)
Avoid antiinflammatories.  Continue to stay hydrated.  Follow metabolic panel.

## 2023-03-17 NOTE — Assessment & Plan Note (Signed)
Low carb diet and exercise given elevated blood glucose. Follow met b and a1c.  

## 2023-03-17 NOTE — Assessment & Plan Note (Signed)
Followed by cardiology.  Continue lopressor.  Overall stable.  Follow.

## 2023-03-17 NOTE — Assessment & Plan Note (Signed)
Has had persistent problems with vertigo, imbalance and dizziness.  Previous negative evaluation for BPV.  Has seen neurology.  Concerned regarding vestibular migraines.  Recommended continuing vestibular exercises at home.  Previous MRI unrevealing.  Symptoms vary.  Saw Dr Richardson Landry (ENT) - 09/2022 - no change.  Follow.  Overall improved.  Follow.

## 2023-03-17 NOTE — Assessment & Plan Note (Signed)
Ger her back on lipitor.  Low cholesterol diet and exercise.  Follow lipid panel and liver function tests.

## 2023-03-17 NOTE — Assessment & Plan Note (Signed)
Stable - zyrtec/nasacort.  Follow.

## 2023-05-21 ENCOUNTER — Other Ambulatory Visit: Payer: Self-pay | Admitting: Internal Medicine

## 2023-05-22 ENCOUNTER — Other Ambulatory Visit: Payer: Self-pay | Admitting: Cardiovascular Disease

## 2023-05-22 NOTE — Telephone Encounter (Signed)
Please contact pt for future appointment. °Pt overdue for 12 month f/u. °Pt needing refills. °

## 2023-05-23 NOTE — Telephone Encounter (Signed)
Left voice mail to schedule appt

## 2023-05-27 NOTE — Telephone Encounter (Signed)
Pt scheduled on 5/30

## 2023-06-11 ENCOUNTER — Other Ambulatory Visit: Payer: Medicare Other

## 2023-06-14 ENCOUNTER — Ambulatory Visit: Payer: Medicare Other | Admitting: Internal Medicine

## 2023-06-20 ENCOUNTER — Other Ambulatory Visit (INDEPENDENT_AMBULATORY_CARE_PROVIDER_SITE_OTHER): Payer: Medicare Other

## 2023-06-20 DIAGNOSIS — E7800 Pure hypercholesterolemia, unspecified: Secondary | ICD-10-CM

## 2023-06-20 DIAGNOSIS — E1165 Type 2 diabetes mellitus with hyperglycemia: Secondary | ICD-10-CM

## 2023-06-20 DIAGNOSIS — I10 Essential (primary) hypertension: Secondary | ICD-10-CM

## 2023-06-20 LAB — BASIC METABOLIC PANEL
BUN: 28 mg/dL — ABNORMAL HIGH (ref 6–23)
CO2: 22 mEq/L (ref 19–32)
Calcium: 9.3 mg/dL (ref 8.4–10.5)
Chloride: 108 mEq/L (ref 96–112)
Creatinine, Ser: 1.23 mg/dL — ABNORMAL HIGH (ref 0.40–1.20)
GFR: 37.65 mL/min — ABNORMAL LOW (ref >60.00)
Glucose, Bld: 104 mg/dL — ABNORMAL HIGH (ref 70–99)
Potassium: 4.6 mEq/L (ref 3.5–5.1)
Sodium: 138 mEq/L (ref 135–145)

## 2023-06-20 LAB — HEPATIC FUNCTION PANEL
ALT: 16 U/L (ref 0–35)
AST: 21 U/L (ref 0–37)
Albumin: 4.2 g/dL (ref 3.5–5.2)
Alkaline Phosphatase: 61 U/L (ref 39–117)
Bilirubin, Direct: 0.1 mg/dL (ref 0.0–0.3)
Total Bilirubin: 0.5 mg/dL (ref 0.2–1.2)
Total Protein: 6.6 g/dL (ref 6.0–8.3)

## 2023-06-20 LAB — TSH: TSH: 3.26 u[IU]/mL (ref 0.35–5.50)

## 2023-06-20 LAB — LIPID PANEL
Cholesterol: 147 mg/dL (ref 0–200)
HDL: 43.8 mg/dL (ref >39.00)
LDL Cholesterol: 79 mg/dL (ref 0–99)
NonHDL: 103.66
Total CHOL/HDL Ratio: 3
Triglycerides: 123 mg/dL (ref 0.0–149.0)
VLDL: 24.6 mg/dL (ref 0.0–40.0)

## 2023-06-20 LAB — HEMOGLOBIN A1C: Hgb A1c MFr Bld: 6.8 % — ABNORMAL HIGH (ref 4.6–6.5)

## 2023-06-24 ENCOUNTER — Encounter: Payer: Self-pay | Admitting: Internal Medicine

## 2023-06-24 ENCOUNTER — Ambulatory Visit: Payer: Medicare Other | Admitting: Internal Medicine

## 2023-06-24 VITALS — BP 136/70 | HR 65 | Temp 98.5°F | Resp 16 | Ht 61.0 in | Wt 143.2 lb

## 2023-06-24 DIAGNOSIS — I471 Supraventricular tachycardia, unspecified: Secondary | ICD-10-CM

## 2023-06-24 DIAGNOSIS — E1165 Type 2 diabetes mellitus with hyperglycemia: Secondary | ICD-10-CM | POA: Diagnosis not present

## 2023-06-24 DIAGNOSIS — F439 Reaction to severe stress, unspecified: Secondary | ICD-10-CM

## 2023-06-24 DIAGNOSIS — R2689 Other abnormalities of gait and mobility: Secondary | ICD-10-CM

## 2023-06-24 DIAGNOSIS — I700 Atherosclerosis of aorta: Secondary | ICD-10-CM

## 2023-06-24 DIAGNOSIS — E7800 Pure hypercholesterolemia, unspecified: Secondary | ICD-10-CM | POA: Diagnosis not present

## 2023-06-24 DIAGNOSIS — N1831 Chronic kidney disease, stage 3a: Secondary | ICD-10-CM | POA: Diagnosis not present

## 2023-06-24 DIAGNOSIS — Z9109 Other allergy status, other than to drugs and biological substances: Secondary | ICD-10-CM

## 2023-06-24 DIAGNOSIS — I10 Essential (primary) hypertension: Secondary | ICD-10-CM

## 2023-06-24 NOTE — Progress Notes (Signed)
Subjective:    Patient ID: Elizabeth [name], female    DOB: 1927/02/16, 89 y.o.   MRN: [id number]  Patient here for  Chief Complaint  Patient presents with   Medical Management of Chronic Issues    Follow up     HPI Here to follow up regarding hypercholesterolemia and hypertension.  Increased stress.  Moved to the [place] [employer] Stress related to this move.  She did recently start painting again.  No chest pain reported.  Breathing overall stable.  No abdominal pain reported.  Does report some issues with balance.  Discussed PT - to help with balance and gait.  Blood pressure ok.     Past Medical History:  Diagnosis Date   Cystocele or rectocele with incomplete uterine prolapse    Hyperlipidemia    Hypertension    Irritable bowel syndrome    Palpitations    Vaginal atrophy    Past Surgical History:  Procedure Laterality Date   CATARACT EXTRACTION  2006   Family History  Problem Relation Age of Onset   Transient ischemic attack Mother        multiple    Dementia Mother    Stroke Father    Heart disease Brother        MI - died age 92   Leukemia Brother    Lung cancer Brother    Throat cancer Brother        had heart disease also   Heart disease Sister    Dementia Sister    Social History   Socioeconomic History   Marital status: Widowed    Spouse name: Not on file   Number of children: 2   Years of education: Not on file   Highest education level: Not on file  Occupational History   Not on file  Tobacco Use   Smoking status: Never   Smokeless tobacco: Never  Vaping Use   Vaping Use: Never used  Substance and Sexual Activity   Alcohol use: No    Alcohol/week: 0.0 standard drinks of alcohol   Drug use: No   Sexual activity: Not Currently    Birth control/protection: None  Other Topics Concern   Not on file  Social History Narrative   Not on file   Social Determinants of Health   Financial Resource Strain: Low Risk  (10/02/2022)   Overall  Financial Resource Strain (CARDIA)    Difficulty of Paying Living Expenses: Not hard at all  Food Insecurity: No Food Insecurity (10/02/2022)   Hunger Vital Sign    Worried About Running Out of Food in the Last Year: Never true    Ran Out of Food in the Last Year: Never true  Transportation Needs: No Transportation Needs (10/02/2022)   PRAPARE - Administrator, Civil Service (Medical): No    Lack of Transportation (Non-Medical): No  Physical Activity: Insufficiently Active (10/02/2022)   Exercise Vital Sign    Days of Exercise per Week: 7 days    Minutes of Exercise per Session: 10 min  Stress: No Stress Concern Present (10/02/2022)   [employer] of Occupational Health - Occupational Stress Questionnaire    Feeling of Stress : Not at all  Social Connections: Unknown (10/02/2022)   Social Connection and Isolation Panel [NHANES]    Frequency of Communication with Friends and Family: More than three times a week    Frequency of Social Gatherings with Friends and Family: Not on file    Attends Religious  Services: Not on file    Active Member of Clubs or Organizations: Not on file    Attends Club or Organization Meetings: Not on file    Marital Status: Widowed     Review of Systems  Constitutional:  Negative for appetite change and unexpected weight change.  HENT:  Negative for congestion and sinus pressure.   Respiratory:  Negative for cough, chest tightness and shortness of breath.   Cardiovascular:  Negative for chest pain, palpitations and leg swelling.  Gastrointestinal:  Negative for abdominal pain, diarrhea, nausea and vomiting.  Genitourinary:  Negative for difficulty urinating and dysuria.  Musculoskeletal:  Negative for joint swelling and myalgias.  Skin:  Negative for color change and rash.  Neurological:  Negative for dizziness and headaches.  Psychiatric/Behavioral:  Negative for agitation and dysphoric mood.        Objective:     BP 136/70    Pulse 65   Temp 98.5 F (36.9 C)   Resp 16   Ht 5\' 1"  (1.549 m)   Wt 143 lb 4 oz (65 kg)   SpO2 98%   BMI 27.07 kg/m  Wt Readings from Last 3 Encounters:  06/24/23 143 lb 4 oz (65 kg)  03/11/23 142 lb (64.4 kg)  12/06/22 144 lb 3.2 oz (65.4 kg)    Physical Exam Vitals reviewed.  Constitutional:      General: She is not in acute distress.    Appearance: Normal appearance.  HENT:     Head: Normocephalic and atraumatic.     Right Ear: External ear normal.     Left Ear: External ear normal.  Eyes:     General: No scleral icterus.       Right eye: No discharge.        Left eye: No discharge.     Conjunctiva/sclera: Conjunctivae normal.  Neck:     Thyroid: No thyromegaly.  Cardiovascular:     Rate and Rhythm: Normal rate and regular rhythm.  Pulmonary:     Effort: No respiratory distress.     Breath sounds: Normal breath sounds. No wheezing.  Abdominal:     General: Bowel sounds are normal.     Palpations: Abdomen is soft.     Tenderness: There is no abdominal tenderness.  Musculoskeletal:        General: No swelling or tenderness.     Cervical back: Neck supple. No tenderness.  Lymphadenopathy:     Cervical: No cervical adenopathy.  Skin:    Findings: No erythema or rash.  Neurological:     Mental Status: She is alert.  Psychiatric:        Mood and Affect: Mood normal.        Behavior: Behavior normal.      Outpatient Encounter Medications as of 06/24/2023  Medication Sig   aspirin 81 MG tablet Take 81 mg by mouth daily.   atorvastatin (LIPITOR) 40 MG tablet Take 1 tablet (40 mg total) by mouth daily.   azelastine (ASTELIN) 0.1 % nasal spray Place 1 spray into both nostrils 2 (two) times daily. Use in each nostril as directed   cetirizine (ZYRTEC) 5 MG tablet Take 5 mg by mouth daily as needed.    lisinopril (ZESTRIL) 40 MG tablet TAKE 1 TABLET BY MOUTH ONCE DAILY   meclizine (ANTIVERT) 12.5 MG tablet Take 2 tablets (25 mg total) by mouth 3 (three) times daily  as needed for dizziness.   metoprolol tartrate (LOPRESSOR) 25 MG tablet Take 1 tablet (  25 mg total) by mouth 2 (two) times daily. Pt needs to keep upcoming appt in Aug for further refills   triamcinolone (NASACORT) 55 MCG/ACT nasal inhaler Place 2 sprays into the nose daily as needed.    No facility-administered encounter medications on file as of 06/24/2023.     Lab Results  Component Value Date   WBC 8.2 12/03/2022   HGB 14.4 12/03/2022   HCT 42.8 12/03/2022   PLT 243.0 12/03/2022   GLUCOSE 104 (H) 06/20/2023   CHOL 147 06/20/2023   TRIG 123.0 06/20/2023   HDL 43.80 06/20/2023   LDLDIRECT 201.0 03/07/2023   LDLCALC 79 06/20/2023   ALT 16 06/20/2023   AST 21 06/20/2023   NA 138 06/20/2023   K 4.6 06/20/2023   CL 108 06/20/2023   CREATININE 1.23 (H) 06/20/2023   BUN 28 (H) 06/20/2023   CO2 22 06/20/2023   TSH 3.26 06/20/2023   HGBA1C 6.8 (H) 06/20/2023   MICROALBUR 3.3 (H) 08/31/2022    MR BRAIN WO CONTRAST  Result Date: 10/26/2021 CLINICAL DATA:  89 year old female with vertigo, nausea and vomiting for 2 days. EXAM: MRI HEAD WITHOUT CONTRAST TECHNIQUE: Multiplanar, multiecho pulse sequences of the brain and surrounding structures were obtained without intravenous contrast. COMPARISON:  Head CT without contrast 2358 hours last night. Brain MRI 05/01/2021. FINDINGS: Brain: No restricted diffusion to suggest acute infarction. No midline shift, mass effect, evidence of mass lesion, ventriculomegaly, extra-axial collection or acute intracranial hemorrhage. Cervicomedullary junction and pituitary are within normal limits. Scattered and patchy but overall mild for age bilateral cerebral white matter T2 and FLAIR hyperintensity is stable since May. No cortical encephalomalacia identified. No definite chronic cerebral blood products. Deep gray matter nuclei, brainstem and cerebellum remain normal for age. Vascular: Major intracranial vascular flow voids are stable. Skull and upper cervical  spine: Negative. Sinuses/Orbits: Stable, negative. Other: Mastoids remain clear. Stable and grossly normal visible internal auditory structures. Stylomastoid foramina and parotid glands appear normal. IMPRESSION: 1. No acute intracranial abnormality. 2. Stable since May and largely normal for age noncontrast MRI appearance of the brain. Electronically Signed   By: Odessa Fleming M.D.   On: 10/26/2021 05:17   CT Head Wo Contrast  Result Date: 10/26/2021 CLINICAL DATA:  Dizziness. EXAM: CT HEAD WITHOUT CONTRAST TECHNIQUE: Contiguous axial images were obtained from the base of the skull through the vertex without intravenous contrast. COMPARISON:  None. FINDINGS: Brain: There is mild cerebral atrophy with widening of the extra-axial spaces and ventricular dilatation. There are areas of decreased attenuation within the white matter tracts of the supratentorial brain, consistent with microvascular disease changes. Vascular: No hyperdense vessel or unexpected calcification. Skull: Normal. Negative for fracture or focal lesion. Sinuses/Orbits: No acute finding. Other: None. IMPRESSION: 1. Generalized cerebral atrophy. 2. No acute intracranial abnormality. Electronically Signed   By: Aram Candela M.D.   On: 10/26/2021 00:09       Assessment & Plan:  Hypercholesterolemia Assessment & Plan: Donette Larry her back on lipitor.  Low cholesterol diet and exercise.  Follow lipid panel and liver function tests.   Orders: -     Lipid panel; Future -     Hepatic function panel; Future -     Basic metabolic panel; Future  Type 2 diabetes mellitus with hyperglycemia, without long-term current use of insulin (HCC) Assessment & Plan: Low carb diet and exercise given elevated blood glucose. Follow met b and a1c.   Orders: -     Hemoglobin A1c; Future  Aortic  atherosclerosis (HCC) Assessment & Plan: Continue lipitor and aspirin.     Stage 3a chronic kidney disease (HCC) Assessment & Plan:  Avoid antiinflammatories.   Continue to stay hydrated.  Follow metabolic panel.    Environmental allergies Assessment & Plan: Stable - zyrtec/nasacort.  Follow.    Primary hypertension Assessment & Plan: On lisinopril and metoprolol.  Spot check pressures.  Follow metabolic panel.    Stress Assessment & Plan: Increased stress.  Discussed.  Recent move.  Will notify me if feels needs further intervention.  Has good family support. Follow.    SVT (supraventricular tachycardia) Assessment & Plan: Followed by cardiology.  Continue lopressor.  Overall stable.  Follow.    Balance problem Assessment & Plan: Has noticed increased problems with balance.  Discussed PT.  Agreeable.  Refer to PT for evaluation and treatment - balance issues   Orders: -     Ambulatory referral to Physical Therapy     Dale Yutan, MD

## 2023-06-29 ENCOUNTER — Encounter: Payer: Self-pay | Admitting: Internal Medicine

## 2023-06-29 DIAGNOSIS — R2689 Other abnormalities of gait and mobility: Secondary | ICD-10-CM | POA: Insufficient documentation

## 2023-06-29 NOTE — Assessment & Plan Note (Signed)
Increased stress.  Discussed.  Recent move.  Will notify me if feels needs further intervention.  Has good family support. Follow.  

## 2023-06-29 NOTE — Assessment & Plan Note (Signed)
On lisinopril and metoprolol.  Spot check pressures.  Follow metabolic panel.  

## 2023-06-29 NOTE — Assessment & Plan Note (Signed)
Continue lipitor and aspirin.  ?

## 2023-06-29 NOTE — Assessment & Plan Note (Signed)
Followed by cardiology.  Continue lopressor.  Overall stable.  Follow.  

## 2023-06-29 NOTE — Assessment & Plan Note (Signed)
Has noticed increased problems with balance.  Discussed PT.  Agreeable.  Refer to PT for evaluation and treatment - balance issues

## 2023-06-29 NOTE — Assessment & Plan Note (Signed)
Stable - zyrtec/nasacort.  Follow.  

## 2023-06-29 NOTE — Assessment & Plan Note (Signed)
Avoid antiinflammatories.  Continue to stay hydrated.  Follow metabolic panel.  ?

## 2023-06-29 NOTE — Assessment & Plan Note (Signed)
Low carb diet and exercise given elevated blood glucose. Follow met b and a1c.  

## 2023-06-29 NOTE — Assessment & Plan Note (Signed)
Ger her back on lipitor.  Low cholesterol diet and exercise.  Follow lipid panel and liver function tests.  

## 2023-07-10 ENCOUNTER — Encounter: Payer: Self-pay | Admitting: Nurse Practitioner

## 2023-07-10 ENCOUNTER — Ambulatory Visit: Payer: Medicare Other | Admitting: Nurse Practitioner

## 2023-07-10 VITALS — BP 142/80 | HR 71 | Temp 98.0°F | Ht 61.0 in | Wt 141.6 lb

## 2023-07-10 DIAGNOSIS — R2689 Other abnormalities of gait and mobility: Secondary | ICD-10-CM | POA: Diagnosis not present

## 2023-07-10 DIAGNOSIS — I10 Essential (primary) hypertension: Secondary | ICD-10-CM

## 2023-07-10 DIAGNOSIS — R42 Dizziness and giddiness: Secondary | ICD-10-CM

## 2023-07-10 NOTE — Progress Notes (Signed)
Elizabeth Dicker, NP-C Phone: [phone]  Elizabeth [name] is a 89 y.o. female who presents today for hypertension.   Patient concerned about recent elevated blood pressure readings. She is currently in physical therapy for chronic issues with dysequilibrium and balance. They checked her blood pressure earlier this week and her systolic was 170-180 each time. She has checked it twice today at home- 180/100 and 160/85. She is scheduled to see her Cardiologist on 08/12/2023. She does report not sleeping well recently. She has had a lot of new changes in her life and increased stressors with moving. She does not think that she drinks enough water daily.   HYPERTENSION Disease Monitoring Home BP Monitoring- see above Chest pain- No    Dyspnea- No Medications Compliance-  Lisinopril and Metoprolol. Lightheadedness-  Chronic  Edema- No BMET    Component Value Date/Time   NA 138 06/20/2023 0940   NA 141 12/01/2021 1015   NA 132 (L) 11/29/2013 2014   K 4.6 06/20/2023 0940   K 4.4 11/29/2013 2014   CL 108 06/20/2023 0940   CL 100 11/29/2013 2014   CO2 22 06/20/2023 0940   CO2 21 11/29/2013 2014   GLUCOSE 104 (H) 06/20/2023 0940   GLUCOSE 100 (H) 11/29/2013 2014   BUN 28 (H) 06/20/2023 0940   BUN 29 12/01/2021 1015   BUN 24 (H) 11/29/2013 2014   CREATININE 1.23 (H) 06/20/2023 0940   CREATININE 0.98 11/29/2013 2014   CALCIUM 9.3 06/20/2023 0940   CALCIUM 9.0 11/29/2013 2014   GFRNONAA 48 (L) 10/27/2021 0725   GFRNONAA 53 (L) 11/29/2013 2014   GFRAA 52 (L) 07/31/2019 1009   GFRAA >60 11/29/2013 2014     Social History   Tobacco Use  Smoking Status Never  Smokeless Tobacco Never    Current Outpatient Medications on File Prior to Visit  Medication Sig Dispense Refill   aspirin 81 MG tablet Take 81 mg by mouth daily.     atorvastatin (LIPITOR) 40 MG tablet Take 1 tablet (40 mg total) by mouth daily. 90 tablet 3   azelastine (ASTELIN) 0.1 % nasal spray Place 1 spray into both nostrils  2 (two) times daily. Use in each nostril as directed (Patient not taking: Reported on 07/10/2023) 30 mL 1   cetirizine (ZYRTEC) 5 MG tablet Take 5 mg by mouth daily as needed.      lisinopril (ZESTRIL) 40 MG tablet TAKE 1 TABLET BY MOUTH ONCE DAILY 90 tablet 3   meclizine (ANTIVERT) 12.5 MG tablet Take 2 tablets (25 mg total) by mouth 3 (three) times daily as needed for dizziness. (Patient not taking: Reported on 07/10/2023) 30 tablet 0   metoprolol tartrate (LOPRESSOR) 25 MG tablet Take 1 tablet (25 mg total) by mouth 2 (two) times daily. Pt needs to keep upcoming appt in Aug for further refills 180 tablet 0   triamcinolone (NASACORT) 55 MCG/ACT nasal inhaler Place 2 sprays into the nose daily as needed.      No current facility-administered medications on file prior to visit.    ROS see history of present illness  Objective  Physical Exam Vitals:   07/10/23 1507 07/10/23 1544  BP: (!) 142/80 (!) 142/80  Pulse: 71   Temp: 98 F (36.7 C)   SpO2: 95%     BP Readings from Last 3 Encounters:  07/10/23 (!) 142/80  06/24/23 136/70  03/11/23 128/88   Wt Readings from Last 3 Encounters:  07/10/23 141 lb 9.6 oz (64.2 kg)  06/24/23  143 lb 4 oz (65 kg)  03/11/23 142 lb (64.4 kg)    Physical Exam Constitutional:      General: She is not in acute distress.    Appearance: Normal appearance.  HENT:     Head: Normocephalic.  Cardiovascular:     Rate and Rhythm: Normal rate and regular rhythm.     Heart sounds: Normal heart sounds.  Pulmonary:     Effort: Pulmonary effort is normal.     Breath sounds: Normal breath sounds.  Skin:    General: Skin is warm and dry.  Neurological:     General: No focal deficit present.     Mental Status: She is alert.  Psychiatric:        Mood and Affect: Mood normal.        Behavior: Behavior normal.    Assessment/Plan: Please see individual problem list.  Primary hypertension Assessment & Plan: Blood pressure elevated today in office x 2  readings. Will start patient on Norvasc 2.5 mg daily. She will continue her Lisinopril 40 mg daily and Lopressor 25 mg BID. Encouraged to continue checking her blood pressure daily at home and keep a log to bring with her to her next appointment. Advised adequate fluid intake and decreasing sodium intake. She will follow up in 2 weeks. Strict return precautions given to patient.   Orders: -     amLODIPine Besylate; Take 1 tablet (2.5 mg total) by mouth daily.  Dispense: 30 tablet; Refill: 0  Dizziness Assessment & Plan: Chronic issue. Currently in physical therapy to help with balance.    Balance problem Assessment & Plan: Continue physical therapy.     Return in about 2 weeks (around 07/24/2023) for Follow up.   Elizabeth Dicker, NP-C [facility] [employer]

## 2023-07-11 MED ORDER — AMLODIPINE BESYLATE 2.5 MG PO TABS
2.5000 mg | ORAL_TABLET | Freq: Every day | ORAL | 0 refills | Status: DC
Start: 2023-07-11 — End: 2023-08-12

## 2023-07-11 NOTE — Assessment & Plan Note (Addendum)
Continue physical therapy

## 2023-07-11 NOTE — Assessment & Plan Note (Signed)
Chronic issue. Currently in physical therapy to help with balance.

## 2023-07-11 NOTE — Assessment & Plan Note (Addendum)
Blood pressure elevated today in office x 2 readings. Will start patient on Norvasc 2.5 mg daily. She will continue her Lisinopril 40 mg daily and Lopressor 25 mg BID. Encouraged to continue checking her blood pressure daily at home and keep a log to bring with her to her next appointment. Advised adequate fluid intake and decreasing sodium intake. She will follow up in 2 weeks. Strict return precautions given to patient.

## 2023-07-25 ENCOUNTER — Ambulatory Visit: Payer: Medicare Other

## 2023-07-25 NOTE — Progress Notes (Signed)
Patient arrived for a BP check. BP  146/78 P  70  SpO2  98 Patient is taking Amlodipine 2.5 mg nightly due to a little dizziness.  Patient sat for about 15 minutes then we checked the BP again. BP 139/85  P  64 Patient is aware of Dr. Roby Lofts plan to continue her current medications and doses. Patient is to schedule a 4-5 week follow up with Dr. Lorin Picket. Patient will do some BP readings at home and drop them off.

## 2023-07-26 ENCOUNTER — Telehealth: Payer: Self-pay | Admitting: Internal Medicine

## 2023-07-26 NOTE — Telephone Encounter (Signed)
Patient walked in and brought her Blood pressure records in the office for Dr. Lorin Picket. The BP records are in the colorful folder upfront. MB

## 2023-07-26 NOTE — Telephone Encounter (Signed)
BP readings placed in folder for review

## 2023-07-27 NOTE — Telephone Encounter (Signed)
Reviewed blood pressures.  Varying, but most averaging 128-140s/70-80s.  Continue current medication for now.  Continue to spot check pressures.  Let us know if problems.

## 2023-07-29 NOTE — Telephone Encounter (Signed)
Patient is aware. Will bring readings to next appt.

## 2023-08-11 NOTE — Progress Notes (Unsigned)
Cardiology Office Note  Date:  08/12/2023   ID:  Elizabeth [name], DOB [birth year], MRN [id number]  PCP:  Dale [place], MD   Cc: moved to [facility], Doing well  HPI:  [name] is an 89 year old woman with history of  hypertension,  hyperlipidemia  Rare episodes of near syncope, no arrhythmia on monitor who presents for  Follow-up of her arrhythmia, lightheadedness, Hypertension Chronic atypical chest sensation, palpitations Previously worked up with zio monitor  LOV with myself February 23  Moved to [place], Independent living Does not eat their food, "southern food"  Weight down 6 pounds in past year, 140 pounds from 146 Goal 125 pounds  Stable vertigo/menieres In hospital 11/22 CT head negative for intracranial normalities.  MRI was negative for stroke. TNT low, stable  Labs reviewed CR 1.23 BUN 24 A1C 6.8  Completed PT Adjusting to loss of husband 5 years ago Lives independently alone in an apartment block  Echocardiogram 01/19/21 with LVEF 55-60%, grade 1 diastolic dysfunction, no RWMA, normal PASP, mild MR, mild AI   EKG personally reviewed by myself on todays visit EKG Interpretation Date/Time:  Monday August 12 2023 15:30:53 EDT Ventricular Rate:  73 PR Interval:  182 QRS Duration:  70 QT Interval:  398 QTC Calculation: 438 R Axis:   -36  Text Interpretation: Normal sinus rhythm Left axis deviation Minimal voltage criteria for LVH, may be normal variant ( R in aVL ) Septal infarct (cited on or before 31-Jul-2019) Possible Lateral infarct (cited on or before 31-Jul-2019) When compared with ECG of 25-Oct-2021 20:10, QRS axis Shifted left Questionable change in initial forces of Septal leads Questionable change in initial forces of Lateral leads T wave inversion no longer evident in Inferior leads Confirmed by Julien Nordmann [phone]) on 08/12/2023 3:38:44 PM    emergency room August 2020 for chest pain, ruled out, felt to be atypical in nature  On last  clinic visit November 2020 was walking on the treadmill Continued medication noncompliance Continued problems with anxiety  ZIO monitor with rare short runs of tachycardia SVT/atrial tach longest 12 seconds Average heart rate 64 bpm No change in monitor compares to 11/2018  Labs reviewed Total chol 151, LDL 81  previous episode of severe tachycardia , persisted longer than other episodes ,  Seem to go away on its own without intervention.  She does have significant stress at home, taking care of her husband. She is the primary caretaker    long history of short runs of palpitations, she has had these for 20 years  previously was using the treadmill 4-5 times per week She tried a lower dose of benazepril, reported her systolic pressure was 170 in the evenings on 20 mg daily, went back to 40 mg daily   PMH:   has a past medical history of Cystocele or rectocele with incomplete uterine prolapse, Hyperlipidemia, Hypertension, Irritable bowel syndrome, Palpitations, and Vaginal atrophy.  PSH:    Past Surgical History:  Procedure Laterality Date   CATARACT EXTRACTION  2006    Current Outpatient Medications  Medication Sig Dispense Refill   amLODipine (NORVASC) 2.5 MG tablet Take 1 tablet (2.5 mg total) by mouth daily. 30 tablet 0   aspirin 81 MG tablet Take 81 mg by mouth daily.     atorvastatin (LIPITOR) 40 MG tablet Take 1 tablet (40 mg total) by mouth daily. 90 tablet 3   cetirizine (ZYRTEC) 5 MG tablet Take 5 mg by mouth daily as needed.  lisinopril (ZESTRIL) 40 MG tablet TAKE 1 TABLET BY MOUTH ONCE DAILY 90 tablet 3   meclizine (ANTIVERT) 12.5 MG tablet Take 2 tablets (25 mg total) by mouth 3 (three) times daily as needed for dizziness. 30 tablet 0   metoprolol tartrate (LOPRESSOR) 25 MG tablet Take 1 tablet (25 mg total) by mouth 2 (two) times daily. Pt needs to keep upcoming appt in Aug for further refills 180 tablet 0   triamcinolone (NASACORT) 55 MCG/ACT nasal inhaler Place  2 sprays into the nose daily as needed.      azelastine (ASTELIN) 0.1 % nasal spray Place 1 spray into both nostrils 2 (two) times daily. Use in each nostril as directed (Patient not taking: Reported on 07/10/2023) 30 mL 1   No current facility-administered medications for this visit.     Allergies:   Patient has no known allergies.   Social History:  The patient  reports that she has never smoked. She has never used smokeless tobacco. She reports that she does not drink alcohol and does not use drugs.   Family History:   family history includes Dementia in her mother and sister; Heart disease in her brother and sister; Leukemia in her brother; Lung cancer in her brother; Stroke in her father; Throat cancer in her brother; Transient ischemic attack in her mother.    Review of Systems: Review of Systems  Constitutional: Negative.   HENT: Negative.    Respiratory: Negative.    Cardiovascular: Negative.   Gastrointestinal: Negative.   Musculoskeletal: Negative.   Neurological: Negative.   Psychiatric/Behavioral: Negative.    All other systems reviewed and are negative.   PHYSICAL EXAM: VS:  BP 122/76 (BP Location: Left Arm, Patient Position: Sitting, Cuff Size: Normal)   Pulse 73   Ht 5' 1.5" (1.562 m)   Wt 140 lb (63.5 kg)   SpO2 95%   BMI 26.02 kg/m  , BMI Body mass index is 26.02 kg/m. Constitutional:  oriented to person, place, and time. No distress.  HENT:  Head: Grossly normal Eyes:  no discharge. No scleral icterus.  Neck: No JVD, no carotid bruits  Cardiovascular: Regular rate and rhythm, no murmurs appreciated Pulmonary/Chest: Clear to auscultation bilaterally, no wheezes or rails Abdominal: Soft.  no distension.  no tenderness.  Musculoskeletal: Normal range of motion Neurological:  normal muscle tone. Coordination normal. No atrophy Skin: Skin warm and dry Psychiatric: normal affect, pleasant   Recent Labs: 12/03/2022: Hemoglobin 14.4; Platelets  243.0 06/20/2023: ALT 16; BUN 28; Creatinine, Ser 1.23; Potassium 4.6; Sodium 138; TSH 3.26    Lipid Panel Lab Results  Component Value Date   CHOL 147 06/20/2023   HDL 43.80 06/20/2023   LDLCALC 79 06/20/2023   TRIG 123.0 06/20/2023      Wt Readings from Last 3 Encounters:  08/12/23 140 lb (63.5 kg)  07/10/23 141 lb 9.6 oz (64.2 kg)  06/24/23 143 lb 4 oz (65 kg)     ASSESSMENT AND PLAN:  Cardiac arrhythmia, unspecified cardiac arrhythmia type  No significant arrhythmia  Essential hypertension -  Blood pressure is well controlled on today's visit. No changes made to the medications.  Hypercholesterolemia Cholesterol is at goal on the current lipid regimen. No changes to the medications were made.  Stress Significant anxiety, stress Managed by PMD   Total encounter time more than 30 minutes  Greater than 50% was spent in counseling and coordination of care with the patient   Orders Placed This Encounter  Procedures  EKG 12-Lead     Signed, Dossie Arbour, M.D., Ph.D. 08/12/2023  [facility] [place], [place] [phone]

## 2023-08-12 ENCOUNTER — Ambulatory Visit: Payer: Medicare Other | Attending: Cardiovascular Disease | Admitting: Cardiovascular Disease

## 2023-08-12 ENCOUNTER — Encounter: Payer: Self-pay | Admitting: Cardiovascular Disease

## 2023-08-12 VITALS — BP 122/76 | HR 73 | Ht 61.5 in | Wt 140.0 lb

## 2023-08-12 DIAGNOSIS — I700 Atherosclerosis of aorta: Secondary | ICD-10-CM

## 2023-08-12 DIAGNOSIS — I471 Supraventricular tachycardia, unspecified: Secondary | ICD-10-CM | POA: Diagnosis not present

## 2023-08-12 DIAGNOSIS — I872 Venous insufficiency (chronic) (peripheral): Secondary | ICD-10-CM

## 2023-08-12 DIAGNOSIS — I5032 Chronic diastolic (congestive) heart failure: Secondary | ICD-10-CM

## 2023-08-12 DIAGNOSIS — E782 Mixed hyperlipidemia: Secondary | ICD-10-CM

## 2023-08-12 DIAGNOSIS — I10 Essential (primary) hypertension: Secondary | ICD-10-CM | POA: Diagnosis not present

## 2023-08-12 DIAGNOSIS — R7989 Other specified abnormal findings of blood chemistry: Secondary | ICD-10-CM

## 2023-08-12 MED ORDER — ATORVASTATIN CALCIUM 40 MG PO TABS: 40.0000 mg | ORAL_TABLET | Freq: Every day | ORAL | 3 refills | Status: DC

## 2023-08-12 MED ORDER — METOPROLOL TARTRATE 25 MG PO TABS: 25.0000 mg | ORAL_TABLET | Freq: Two times a day (BID) | ORAL | 3 refills | Status: DC

## 2023-08-12 MED ORDER — AMLODIPINE BESYLATE 2.5 MG PO TABS
2.5000 mg | ORAL_TABLET | Freq: Every day | ORAL | 3 refills | Status: DC
Start: 2023-08-12 — End: 2024-10-20

## 2023-08-12 MED ORDER — LISINOPRIL 40 MG PO TABS: 40.0000 mg | ORAL_TABLET | Freq: Every day | ORAL | 3 refills | Status: DC

## 2023-08-12 NOTE — Patient Instructions (Signed)

## 2023-08-22 ENCOUNTER — Ambulatory Visit: Payer: Medicare Other | Admitting: Internal Medicine

## 2023-08-22 ENCOUNTER — Encounter: Payer: Self-pay | Admitting: Internal Medicine

## 2023-08-22 VITALS — BP 132/80 | HR 74 | Temp 97.9°F | Resp 16 | Ht 61.5 in | Wt 142.0 lb

## 2023-08-22 DIAGNOSIS — E538 Deficiency of other specified B group vitamins: Secondary | ICD-10-CM

## 2023-08-22 DIAGNOSIS — E1165 Type 2 diabetes mellitus with hyperglycemia: Secondary | ICD-10-CM

## 2023-08-22 DIAGNOSIS — I471 Supraventricular tachycardia, unspecified: Secondary | ICD-10-CM

## 2023-08-22 DIAGNOSIS — I700 Atherosclerosis of aorta: Secondary | ICD-10-CM

## 2023-08-22 DIAGNOSIS — N8110 Cystocele, unspecified: Secondary | ICD-10-CM

## 2023-08-22 DIAGNOSIS — E7800 Pure hypercholesterolemia, unspecified: Secondary | ICD-10-CM

## 2023-08-22 DIAGNOSIS — F439 Reaction to severe stress, unspecified: Secondary | ICD-10-CM

## 2023-08-22 DIAGNOSIS — N1831 Chronic kidney disease, stage 3a: Secondary | ICD-10-CM

## 2023-08-22 DIAGNOSIS — I10 Essential (primary) hypertension: Secondary | ICD-10-CM

## 2023-08-22 DIAGNOSIS — R2689 Other abnormalities of gait and mobility: Secondary | ICD-10-CM

## 2023-08-22 NOTE — Progress Notes (Signed)
Subjective:    Patient ID: Elizabeth [name], female    DOB: [birth year], 89 y.o.   MRN: [id number]  Patient here for  Chief Complaint  Patient presents with   Hypertension    HPI Here for f/u - f/u regarding her blood pressure. Just evaluated by cardiology 08/12/23.  Blood pressure controlled - at that vsiit. No changes made. Continues on amlodipine 2.5mg  (added recently), metoprolol and lisinopril.  Trying to stay active.  Going to PT.  Working on balance.  Discussed exercise.  No chest pain reported.  Breathing stable. No abdominal pain or bowel change reported.  Request f/u with Dr Valentino Saxon - vaginal issues.  Was previously seeing her for pessary maintenance.  Also reports increased pain - base of thumb.  Desires no further workup at this time.    Past Medical History:  Diagnosis Date   Cystocele or rectocele with incomplete uterine prolapse    Hyperlipidemia    Hypertension    Irritable bowel syndrome    Palpitations    Vaginal atrophy    Past Surgical History:  Procedure Laterality Date   CATARACT EXTRACTION  2006   Family History  Problem Relation Age of Onset   Transient ischemic attack Mother        multiple    Dementia Mother    Stroke Father    Heart disease Brother        MI - died age 8   Leukemia Brother    Lung cancer Brother    Throat cancer Brother        had heart disease also   Heart disease Sister    Dementia Sister    Social History   Socioeconomic History   Marital status: Widowed    Spouse name: Not on file   Number of children: 2   Years of education: Not on file   Highest education level: Not on file  Occupational History   Not on file  Tobacco Use   Smoking status: Never   Smokeless tobacco: Never  Vaping Use   Vaping status: Never Used  Substance and Sexual Activity   Alcohol use: No    Alcohol/week: 0.0 standard drinks of alcohol   Drug use: No   Sexual activity: Not Currently    Birth control/protection: None  Other Topics Concern    Not on file  Social History Narrative   Not on file   Social Determinants of Health   Financial Resource Strain: Low Risk  (10/02/2022)   Overall Financial Resource Strain (CARDIA)    Difficulty of Paying Living Expenses: Not hard at all  Food Insecurity: No Food Insecurity (10/02/2022)   Hunger Vital Sign    Worried About Running Out of Food in the Last Year: Never true    Ran Out of Food in the Last Year: Never true  Transportation Needs: No Transportation Needs (10/02/2022)   PRAPARE - Administrator, Civil Service (Medical): No    Lack of Transportation (Non-Medical): No  Physical Activity: Insufficiently Active (10/02/2022)   Exercise Vital Sign    Days of Exercise per Week: 7 days    Minutes of Exercise per Session: 10 min  Stress: No Stress Concern Present (10/02/2022)   [employer] of Occupational Health - Occupational Stress Questionnaire    Feeling of Stress : Not at all  Social Connections: Unknown (10/02/2022)   Social Connection and Isolation Panel [NHANES]    Frequency of Communication with Friends and Family: More than  three times a week    Frequency of Social Gatherings with Friends and Family: Not on file    Attends Religious Services: Not on file    Active Member of Clubs or Organizations: Not on file    Attends Banker Meetings: Not on file    Marital Status: Widowed     Review of Systems  Constitutional:  Negative for appetite change and unexpected weight change.  HENT:  Negative for congestion and sinus pressure.   Respiratory:  Negative for cough, chest tightness and shortness of breath.   Cardiovascular:  Negative for chest pain, palpitations and leg swelling.  Gastrointestinal:  Negative for abdominal pain, diarrhea, nausea and vomiting.  Genitourinary:  Negative for difficulty urinating and dysuria.  Musculoskeletal:  Negative for myalgias.       Pain base of thumb.   Skin:  Negative for color change and rash.   Neurological:  Negative for dizziness and headaches.  Psychiatric/Behavioral:  Negative for agitation and dysphoric mood.        Objective:     BP 132/80   Pulse 74   Temp 97.9 F (36.6 C)   Resp 16   Ht 5' 1.5" (1.562 m)   Wt 142 lb (64.4 kg)   SpO2 99%   BMI 26.40 kg/m  Wt Readings from Last 3 Encounters:  08/22/23 142 lb (64.4 kg)  08/12/23 140 lb (63.5 kg)  07/10/23 141 lb 9.6 oz (64.2 kg)    Physical Exam Vitals reviewed.  Constitutional:      General: She is not in acute distress.    Appearance: Normal appearance.  HENT:     Head: Normocephalic and atraumatic.     Right Ear: External ear normal.     Left Ear: External ear normal.  Eyes:     General: No scleral icterus.       Right eye: No discharge.        Left eye: No discharge.     Conjunctiva/sclera: Conjunctivae normal.  Neck:     Thyroid: No thyromegaly.  Cardiovascular:     Rate and Rhythm: Normal rate and regular rhythm.  Pulmonary:     Effort: No respiratory distress.     Breath sounds: Normal breath sounds. No wheezing.  Abdominal:     General: Bowel sounds are normal.     Palpations: Abdomen is soft.     Tenderness: There is no abdominal tenderness.  Musculoskeletal:        General: No swelling or tenderness.     Cervical back: Neck supple. No tenderness.  Lymphadenopathy:     Cervical: No cervical adenopathy.  Skin:    Findings: No erythema or rash.  Neurological:     Mental Status: She is alert.  Psychiatric:        Mood and Affect: Mood normal.        Behavior: Behavior normal.      Outpatient Encounter Medications as of 08/22/2023  Medication Sig   amLODipine (NORVASC) 2.5 MG tablet Take 1 tablet (2.5 mg total) by mouth daily.   aspirin 81 MG tablet Take 81 mg by mouth daily.   atorvastatin (LIPITOR) 40 MG tablet Take 1 tablet (40 mg total) by mouth daily.   cetirizine (ZYRTEC) 5 MG tablet Take 5 mg by mouth daily as needed.    lisinopril (ZESTRIL) 40 MG tablet Take 1 tablet  (40 mg total) by mouth daily.   meclizine (ANTIVERT) 12.5 MG tablet Take 2 tablets (25 mg total)  by mouth 3 (three) times daily as needed for dizziness.   metoprolol tartrate (LOPRESSOR) 25 MG tablet Take 1 tablet (25 mg total) by mouth 2 (two) times daily.   triamcinolone (NASACORT) 55 MCG/ACT nasal inhaler Place 2 sprays into the nose daily as needed.    [DISCONTINUED] azelastine (ASTELIN) 0.1 % nasal spray Place 1 spray into both nostrils 2 (two) times daily. Use in each nostril as directed (Patient not taking: Reported on 07/10/2023)   No facility-administered encounter medications on file as of 08/22/2023.     Lab Results  Component Value Date   WBC 8.2 12/03/2022   HGB 14.4 12/03/2022   HCT 42.8 12/03/2022   PLT 243.0 12/03/2022   GLUCOSE 104 (H) 06/20/2023   CHOL 147 06/20/2023   TRIG 123.0 06/20/2023   HDL 43.80 06/20/2023   LDLDIRECT 201.0 03/07/2023   LDLCALC 79 06/20/2023   ALT 16 06/20/2023   AST 21 06/20/2023   NA 138 06/20/2023   K 4.6 06/20/2023   CL 108 06/20/2023   CREATININE 1.23 (H) 06/20/2023   BUN 28 (H) 06/20/2023   CO2 22 06/20/2023   TSH 3.26 06/20/2023   HGBA1C 6.8 (H) 06/20/2023   MICROALBUR 3.3 (H) 08/31/2022    MR BRAIN WO CONTRAST  Result Date: 10/26/2021 CLINICAL DATA:  89 year old female with vertigo, nausea and vomiting for 2 days. EXAM: MRI HEAD WITHOUT CONTRAST TECHNIQUE: Multiplanar, multiecho pulse sequences of the brain and surrounding structures were obtained without intravenous contrast. COMPARISON:  Head CT without contrast 2358 hours last night. Brain MRI 05/01/2021. FINDINGS: Brain: No restricted diffusion to suggest acute infarction. No midline shift, mass effect, evidence of mass lesion, ventriculomegaly, extra-axial collection or acute intracranial hemorrhage. Cervicomedullary junction and pituitary are within normal limits. Scattered and patchy but overall mild for age bilateral cerebral white matter T2 and FLAIR hyperintensity is stable  since May. No cortical encephalomalacia identified. No definite chronic cerebral blood products. Deep gray matter nuclei, brainstem and cerebellum remain normal for age. Vascular: Major intracranial vascular flow voids are stable. Skull and upper cervical spine: Negative. Sinuses/Orbits: Stable, negative. Other: Mastoids remain clear. Stable and grossly normal visible internal auditory structures. Stylomastoid foramina and parotid glands appear normal. IMPRESSION: 1. No acute intracranial abnormality. 2. Stable since May and largely normal for age noncontrast MRI appearance of the brain. Electronically Signed   By: Odessa Fleming M.D.   On: 10/26/2021 05:17   CT Head Wo Contrast  Result Date: 10/26/2021 CLINICAL DATA:  Dizziness. EXAM: CT HEAD WITHOUT CONTRAST TECHNIQUE: Contiguous axial images were obtained from the base of the skull through the vertex without intravenous contrast. COMPARISON:  None. FINDINGS: Brain: There is mild cerebral atrophy with widening of the extra-axial spaces and ventricular dilatation. There are areas of decreased attenuation within the white matter tracts of the supratentorial brain, consistent with microvascular disease changes. Vascular: No hyperdense vessel or unexpected calcification. Skull: Normal. Negative for fracture or focal lesion. Sinuses/Orbits: No acute finding. Other: None. IMPRESSION: 1. Generalized cerebral atrophy. 2. No acute intracranial abnormality. Electronically Signed   By: Aram Candela M.D.   On: 10/26/2021 00:09       Assessment & Plan:  Vaginal prolapse Assessment & Plan: Was followed by Dr Valentino Saxon.  Previously followed for pessary maintenance.  Has been awhile since has been evaluated.  Request referral back to Dr Valentino Saxon for evaluation.   Orders: -     Ambulatory referral to Gynecology  Type 2 diabetes mellitus with hyperglycemia, without long-term current  use of insulin (HCC) Assessment & Plan: Low carb diet and exercise given elevated blood  glucose. Follow met b and a1c.    Stage 3a chronic kidney disease (HCC) Assessment & Plan:  Avoid antiinflammatories.  Continue to stay hydrated.  Follow metabolic panel.    Aortic atherosclerosis (HCC) Assessment & Plan: Continue lipitor and aspirin.     B12 deficiency Assessment & Plan: Check B12 level.    Balance problem Assessment & Plan: Continue PT.    Hypercholesterolemia Assessment & Plan: Low cholesterol diet and exercise.  Follow lipid panel and liver function tests. Continue lipitor.    Primary hypertension Assessment & Plan: On lisinopril and metoprolol. Amlodipine 2.5mg  recently added.  Blood pressure as outlined.  Continue current medication regimen.  Spot check pressures.  Follow metabolic panel.    Stress Assessment & Plan: Appears to be handling things relatively well.  Will notify me if feels needs further intervention.  Has good family support. Follow. Continue PT.    SVT (supraventricular tachycardia) Assessment & Plan: Followed by cardiology.  Continue lopressor.  Overall stable.  Follow.       Dale [place], MD

## 2023-08-24 ENCOUNTER — Encounter: Payer: Self-pay | Admitting: Internal Medicine

## 2023-08-24 NOTE — Assessment & Plan Note (Signed)
Low carb diet and exercise given elevated blood glucose. Follow met b and a1c.  

## 2023-08-24 NOTE — Assessment & Plan Note (Signed)
Avoid antiinflammatories.  Continue to stay hydrated.  Follow metabolic panel.  

## 2023-08-24 NOTE — Assessment & Plan Note (Signed)
Appears to be handling things relatively well.  Will notify me if feels needs further intervention.  Has good family support. Follow. Continue PT.

## 2023-08-24 NOTE — Assessment & Plan Note (Signed)
Check B12 level. 

## 2023-08-24 NOTE — Assessment & Plan Note (Signed)
Followed by cardiology.  Continue lopressor.  Overall stable.  Follow.  

## 2023-08-24 NOTE — Assessment & Plan Note (Signed)
On lisinopril and metoprolol. Amlodipine 2.5mg  recently added.  Blood pressure as outlined.  Continue current medication regimen.  Spot check pressures.  Follow metabolic panel.

## 2023-08-24 NOTE — Assessment & Plan Note (Signed)
Continue lipitor and aspirin.  ?

## 2023-08-24 NOTE — Assessment & Plan Note (Signed)
Low cholesterol diet and exercise.  Follow lipid panel and liver function tests.  Continue lipitor.

## 2023-08-24 NOTE — Assessment & Plan Note (Signed)
Continue PT

## 2023-08-25 NOTE — Assessment & Plan Note (Signed)
Was followed by Dr Valentino Saxon.  Previously followed for pessary maintenance.  Has been awhile since has been evaluated.  Request referral back to Dr Valentino Saxon for evaluation.

## 2023-10-01 NOTE — Progress Notes (Unsigned)
GYNECOLOGY PROGRESS NOTE  Subjective:    Patient ID: Elizabeth [name], female    DOB: 05-Apr-1927, 89 y.o.   MRN: [id number]  HPI  Patient is a 89 y.o. G47P2002 female who presents for evaluation of vaginal prolapse. She has past medical history of cystocele or rectocele with incomplete uterine prolapse and vaginal atrophy. She was previously seeing Dale Newtonia, MD for pessary maintenance. She requested to have follow up with Dr. Valentino Saxon for pessary maintenance.  {Common ambulatory SmartLinks:19316}  Review of Systems {ros; complete:30496}   Objective:   There were no vitals taken for this visit. There is no height or weight on file to calculate BMI. General appearance: {general exam:16600} Abdomen: {abdominal exam:16834} Pelvic: {pelvic exam:16852::"cervix normal in appearance","external genitalia normal","no adnexal masses or tenderness","no cervical motion tenderness","rectovaginal septum normal","uterus normal size, shape, and consistency","vagina normal without discharge"} Extremities: {extremity exam:5109} Neurologic: {neuro exam:17854}   Assessment:   No diagnosis found.   Plan:   There are no diagnoses linked to this encounter.     Hildred Laser, MD [facility]

## 2023-10-02 ENCOUNTER — Ambulatory Visit: Payer: Medicare Other | Admitting: Obstetrics and Gynecology

## 2023-10-02 ENCOUNTER — Telehealth: Payer: Self-pay | Admitting: Internal Medicine

## 2023-10-02 ENCOUNTER — Encounter: Payer: Self-pay | Admitting: Obstetrics and Gynecology

## 2023-10-02 VITALS — BP 144/72 | HR 66 | Resp 16 | Ht 61.25 in | Wt 145.2 lb

## 2023-10-02 DIAGNOSIS — Z4689 Encounter for fitting and adjustment of other specified devices: Secondary | ICD-10-CM | POA: Diagnosis not present

## 2023-10-02 DIAGNOSIS — N952 Postmenopausal atrophic vaginitis: Secondary | ICD-10-CM | POA: Diagnosis not present

## 2023-10-02 DIAGNOSIS — N813 Complete uterovaginal prolapse: Secondary | ICD-10-CM | POA: Diagnosis not present

## 2023-10-02 DIAGNOSIS — N814 Uterovaginal prolapse, unspecified: Secondary | ICD-10-CM

## 2023-10-02 NOTE — Telephone Encounter (Signed)
Copied from CRM [id number]. Topic: Medicare AWV >> Oct 02, 2023  2:41 PM Payton Doughty wrote: Reason for CRM: Called LVM 10/02/2023 to schedule Annual Wellness Visit  Verlee Rossetti; Care Guide Ambulatory Clinical Support [place] [facility] Direct Dial: [phone]

## 2023-10-14 ENCOUNTER — Telehealth: Payer: Self-pay | Admitting: Obstetrics and Gynecology

## 2023-10-14 NOTE — Telephone Encounter (Addendum)
Pessary arrived. I contact patient to schedule appointment for placement. Left message advising the patient to contact the office for scheduling.Please forward message to Crystal once the patient to scheduled.

## 2023-10-22 ENCOUNTER — Other Ambulatory Visit (INDEPENDENT_AMBULATORY_CARE_PROVIDER_SITE_OTHER): Payer: Medicare Other

## 2023-10-22 DIAGNOSIS — E7800 Pure hypercholesterolemia, unspecified: Secondary | ICD-10-CM | POA: Diagnosis not present

## 2023-10-22 DIAGNOSIS — E1165 Type 2 diabetes mellitus with hyperglycemia: Secondary | ICD-10-CM

## 2023-10-22 LAB — LIPID PANEL
Cholesterol: 180 mg/dL (ref 0–200)
HDL: 48.4 mg/dL (ref >39.00)
LDL Cholesterol: 98 mg/dL (ref 0–99)
NonHDL: 131.19
Total CHOL/HDL Ratio: 4
Triglycerides: 164 mg/dL — ABNORMAL HIGH (ref 0.0–149.0)
VLDL: 32.8 mg/dL (ref 0.0–40.0)

## 2023-10-22 LAB — BASIC METABOLIC PANEL
BUN: 32 mg/dL — ABNORMAL HIGH (ref 6–23)
CO2: 21 meq/L (ref 19–32)
Calcium: 9.3 mg/dL (ref 8.4–10.5)
Chloride: 108 meq/L (ref 96–112)
Creatinine, Ser: 1.27 mg/dL — ABNORMAL HIGH (ref 0.40–1.20)
GFR: 36.14 mL/min — ABNORMAL LOW (ref >60.00)
Glucose, Bld: 103 mg/dL — ABNORMAL HIGH (ref 70–99)
Potassium: 4.4 meq/L (ref 3.5–5.1)
Sodium: 138 meq/L (ref 135–145)

## 2023-10-22 LAB — HEMOGLOBIN A1C: Hgb A1c MFr Bld: 7 % — ABNORMAL HIGH (ref 4.6–6.5)

## 2023-10-22 LAB — HEPATIC FUNCTION PANEL
ALT: 16 U/L (ref 0–35)
AST: 19 U/L (ref 0–37)
Albumin: 4.3 g/dL (ref 3.5–5.2)
Alkaline Phosphatase: 67 U/L (ref 39–117)
Bilirubin, Direct: 0.1 mg/dL (ref 0.0–0.3)
Total Bilirubin: 0.5 mg/dL (ref 0.2–1.2)
Total Protein: 6.8 g/dL (ref 6.0–8.3)

## 2023-10-23 ENCOUNTER — Encounter: Payer: Self-pay | Admitting: Obstetrics and Gynecology

## 2023-10-23 ENCOUNTER — Ambulatory Visit: Payer: Medicare Other | Admitting: Obstetrics and Gynecology

## 2023-10-23 VITALS — BP 123/67 | HR 69 | Resp 16 | Ht 61.5 in | Wt 143.9 lb

## 2023-10-23 DIAGNOSIS — N952 Postmenopausal atrophic vaginitis: Secondary | ICD-10-CM | POA: Diagnosis not present

## 2023-10-23 DIAGNOSIS — N814 Uterovaginal prolapse, unspecified: Secondary | ICD-10-CM | POA: Diagnosis not present

## 2023-10-23 DIAGNOSIS — Z4689 Encounter for fitting and adjustment of other specified devices: Secondary | ICD-10-CM | POA: Diagnosis not present

## 2023-10-23 MED ORDER — TRIMO-SAN 0.025 % VA GEL: 1.0000 | VAGINAL | 3 refills | Status: DC

## 2023-10-23 NOTE — Patient Instructions (Signed)
How to Use a Vaginal Pessary  A vaginal pessary is a removable device that is placed into your vagina to support pelvic organs that droop. These organs include your uterus, bladder, and rectum. When your pelvic organs drop down into your vagina, it causes a condition called pelvic organ prolapse (POP). A pessary may be an alternative to surgery for women with POP. It may help women who leak urine when they strain or exercise (stress incontinence). This is a symptom of POP. A vaginal pessary may also be a temporary treatment for stress incontinence during pregnancy. There are several types of pessaries. All types are usually made of silicone. You can insert and remove some on your own. Other types must be inserted and removed by your health care provider at office visits. The reason you are using a pessary and the severity of your condition will determine which one is best for you. It is also important to find the right size. A pessary that is too small may fall out. A pessary that is too large may cause pain or discomfort. Your health care provider will do a physical exam to find the correct size and fit for your pessary. It may take several appointments to find the best fit for you. If you can be fit with the type of pessary that you can insert, remove, and clean yourself, your health care provider will teach you how to use your pessary at home. You may have checkups every few months. If you have the type of pessary that needs to be inserted and removed by your health care provider, you will have appointments every few months to have the pessary removed, cleaned, and replaced. What are the risks? When properly fitted and cared for, risks of using a vaginal pessary can be small. However, there can be problems that may include: Vaginal discharge. Vaginal bleeding. A bad smell coming from your vagina. Scraping of the skin inside your vagina. How to use your pessary Follow your health care provider's  instructions for using a pessary. These instructions may vary, depending on the type of pessary you have. To insert a pessary: Wash your hands with soap and water for at least 20 seconds. Squeeze or fold the pessary in half and lubricate the tip with a water-based lubricant. Insert the pessary into your vagina. It will unfold and provide support. To remove the pessary, gently tug it out of your vagina. You can remove the pessary every night or after several days. You can also remove it to have sex. How to care for your pessary If you have a pessary that you can remove: Clean your pessary with soap and water. Rinse well. Dry it completely before inserting it back into your vagina. Follow these instructions at home: Take over-the-counter and prescription medicines only as told by your health care provider. Your health care provider may prescribe an estrogen cream to moisten your vagina. Keep all follow-up visits. This is important. Contact a health care provider if: You feel any pain or discomfort when your pessary is in place. You continue to have stress incontinence. You have trouble keeping your pessary from falling out. You have an unusual vaginal discharge that is blood-tinged or smells bad. Summary A vaginal pessary is a removable device that is placed into your vagina to support pelvic organs that droop. This condition is called pelvic organ prolapse (POP). There are several types of pessaries. Some you can insert and remove on your own. Others must be inserted and  removed by your health care provider. The best type for you depends on the reason you are using a pessary and the severity of your condition. It is also important to find the right size. If you can use the type that you insert and remove on your own, your health care provider will teach you how to use it and schedule checkups every few months. If you have the type that needs to be inserted and removed by your health care  provider, you will have regular appointments to have your pessary removed, cleaned, and replaced. This information is not intended to replace advice given to you by your health care provider. Make sure you discuss any questions you have with your health care provider. Document Revised: 06/09/2020 Document Reviewed: 06/09/2020 Elsevier Patient Education  2024 [employer].

## 2023-10-23 NOTE — Progress Notes (Signed)
GYNECOLOGY PROGRESS NOTE  Subjective:    Patient ID: Elizabeth [name], female    DOB: 1927/07/29, 89 y.o.   MRN: [id number]  HPI  Patient is a 89 y.o. G42P2002 female who presents for new pessary Insertion. She has past medical history of cystocele or rectocele with incomplete uterine prolapse and vaginal atrophy. She is switching from her 2 3/4 incontience dish with knob to a 2 1/2 Gelhorn pessary (has used a Electronics engineer in the remote past, but discontinued use due to discomfort and stress incontinence.  No longer has incontinence. Current incontinence dish is too smal and tends to descend with her prolapse.   The following portions of the patient's history were reviewed and updated as appropriate: allergies, current medications, past family history, past medical history, past social history, past surgical history, and problem list.  Review of Systems Pertinent items noted in HPI and remainder of comprehensive ROS otherwise negative.   Objective:   Blood pressure 123/67, pulse 69, resp. rate 16, height 5' 1.5" (1.562 m), weight 143 lb 14.4 oz (65.3 kg). Body mass index is 26.75 kg/m. General appearance: alert and no distress Abdomen: soft, non-tender; bowel sounds normal; no masses,  no organomegaly Pelvic: Incontinence dish removed, pelvic exam notes Grad 3 cystocele with incomplete uterine prolapse. No lesions or lacerations, mild atrophy present.  New pessary (Gelhorn) inserted.  Old pessary cleaned and returned to the patient.    Assessment:   1. Cystocele with uterine prolapse   2. Vaginal atrophy      Plan:   - The patient should return in 3 months for a pessary check. Will prescribe Trimo-San gel for pessary maintenance, to utilize weekly.    Hildred Laser, MD [facility]

## 2023-10-25 ENCOUNTER — Ambulatory Visit: Payer: Medicare Other | Admitting: Internal Medicine

## 2023-10-25 VITALS — BP 120/68 | HR 71 | Temp 98.2°F | Resp 16 | Ht 61.5 in | Wt 142.8 lb

## 2023-10-25 DIAGNOSIS — F439 Reaction to severe stress, unspecified: Secondary | ICD-10-CM | POA: Diagnosis not present

## 2023-10-25 DIAGNOSIS — I5189 Other ill-defined heart diseases: Secondary | ICD-10-CM

## 2023-10-25 DIAGNOSIS — N1831 Chronic kidney disease, stage 3a: Secondary | ICD-10-CM

## 2023-10-25 DIAGNOSIS — I471 Supraventricular tachycardia, unspecified: Secondary | ICD-10-CM | POA: Diagnosis not present

## 2023-10-25 DIAGNOSIS — N8110 Cystocele, unspecified: Secondary | ICD-10-CM | POA: Diagnosis not present

## 2023-10-25 DIAGNOSIS — I10 Essential (primary) hypertension: Secondary | ICD-10-CM

## 2023-10-25 DIAGNOSIS — E7800 Pure hypercholesterolemia, unspecified: Secondary | ICD-10-CM

## 2023-10-25 DIAGNOSIS — I700 Atherosclerosis of aorta: Secondary | ICD-10-CM

## 2023-10-25 DIAGNOSIS — E1165 Type 2 diabetes mellitus with hyperglycemia: Secondary | ICD-10-CM | POA: Diagnosis not present

## 2023-10-25 NOTE — Progress Notes (Unsigned)
Subjective:    Patient ID: Elizabeth [name], female    DOB: [birth year], 89 y.o.   MRN: [id number]  Patient here for  Chief Complaint  Patient presents with   Medical Management of Chronic Issues    HPI Here for a scheduled follow up. Here for f/u - f/u regarding her blood pressure. Just evaluated by cardiology 08/12/23. Blood pressure controlled - at that vsiit. No changes made. Continues on amlodipine 2.5mg  (added recently), metoprolol and lisinopril. Blood pressure doing better.  She is painting.  Reading.  Not going out much.  Did complete PT recently.  Is interested in cardiopulmonary rehab.   Past Medical History:  Diagnosis Date   Cystocele or rectocele with incomplete uterine prolapse    Hyperlipidemia    Hypertension    Irritable bowel syndrome    Palpitations    Vaginal atrophy    Past Surgical History:  Procedure Laterality Date   CATARACT EXTRACTION  2006   Family History  Problem Relation Age of Onset   Transient ischemic attack Mother        multiple    Dementia Mother    Stroke Father    Heart disease Brother        MI - died age 15   Leukemia Brother    Lung cancer Brother    Throat cancer Brother        had heart disease also   Heart disease Sister    Dementia Sister    Social History   Socioeconomic History   Marital status: Widowed    Spouse name: Not on file   Number of children: 2   Years of education: Not on file   Highest education level: Not on file  Occupational History   Not on file  Tobacco Use   Smoking status: Never   Smokeless tobacco: Never  Vaping Use   Vaping status: Never Used  Substance and Sexual Activity   Alcohol use: No    Alcohol/week: 0.0 standard drinks of alcohol   Drug use: No   Sexual activity: Not Currently    Birth control/protection: None  Other Topics Concern   Not on file  Social History Narrative   Not on file   Social Determinants of Health   Financial Resource Strain: Low Risk  (10/02/2022)    Overall Financial Resource Strain (CARDIA)    Difficulty of Paying Living Expenses: Not hard at all  Food Insecurity: No Food Insecurity (10/02/2022)   Hunger Vital Sign    Worried About Running Out of Food in the Last Year: Never true    Ran Out of Food in the Last Year: Never true  Transportation Needs: No Transportation Needs (10/02/2022)   PRAPARE - Administrator, Civil Service (Medical): No    Lack of Transportation (Non-Medical): No  Physical Activity: Insufficiently Active (10/02/2022)   Exercise Vital Sign    Days of Exercise per Week: 7 days    Minutes of Exercise per Session: 10 min  Stress: No Stress Concern Present (10/02/2022)   [employer] of Occupational Health - Occupational Stress Questionnaire    Feeling of Stress : Not at all  Social Connections: Unknown (10/02/2022)   Social Connection and Isolation Panel [NHANES]    Frequency of Communication with Friends and Family: More than three times a week    Frequency of Social Gatherings with Friends and Family: Not on file    Attends Religious Services: Not on file    Active  Member of Clubs or Organizations: Not on file    Attends Banker Meetings: Not on file    Marital Status: Widowed     Review of Systems     Objective:     There were no vitals taken for this visit. Wt Readings from Last 3 Encounters:  10/23/23 143 lb 14.4 oz (65.3 kg)  10/02/23 145 lb 3.2 oz (65.9 kg)  08/22/23 142 lb (64.4 kg)    Physical Exam   Outpatient Encounter Medications as of 10/25/2023  Medication Sig   amLODipine (NORVASC) 2.5 MG tablet Take 1 tablet (2.5 mg total) by mouth daily.   aspirin 81 MG tablet Take 81 mg by mouth daily.   atorvastatin (LIPITOR) 40 MG tablet Take 1 tablet (40 mg total) by mouth daily.   cetirizine (ZYRTEC) 5 MG tablet Take 5 mg by mouth daily as needed.    lisinopril (ZESTRIL) 40 MG tablet Take 1 tablet (40 mg total) by mouth daily.   meclizine (ANTIVERT) 12.5 MG  tablet Take 2 tablets (25 mg total) by mouth 3 (three) times daily as needed for dizziness.   metoprolol tartrate (LOPRESSOR) 25 MG tablet Take 1 tablet (25 mg total) by mouth 2 (two) times daily.   OXYQUINOLONE SULFATE VAGINAL (TRIMO-SAN) 0.025 % GEL Place 1 Applicatorful vaginally once a week.   triamcinolone (NASACORT) 55 MCG/ACT nasal inhaler Place 2 sprays into the nose daily as needed.    No facility-administered encounter medications on file as of 10/25/2023.     Lab Results  Component Value Date   WBC 8.2 12/03/2022   HGB 14.4 12/03/2022   HCT 42.8 12/03/2022   PLT 243.0 12/03/2022   GLUCOSE 103 (H) 10/22/2023   CHOL 180 10/22/2023   TRIG 164.0 (H) 10/22/2023   HDL 48.40 10/22/2023   LDLDIRECT 201.0 03/07/2023   LDLCALC 98 10/22/2023   ALT 16 10/22/2023   AST 19 10/22/2023   NA 138 10/22/2023   K 4.4 10/22/2023   CL 108 10/22/2023   CREATININE 1.27 (H) 10/22/2023   BUN 32 (H) 10/22/2023   CO2 21 10/22/2023   TSH 3.26 06/20/2023   HGBA1C 7.0 (H) 10/22/2023   MICROALBUR 3.3 (H) 08/31/2022    MR BRAIN WO CONTRAST  Result Date: 10/26/2021 CLINICAL DATA:  89 year old female with vertigo, nausea and vomiting for 2 days. EXAM: MRI HEAD WITHOUT CONTRAST TECHNIQUE: Multiplanar, multiecho pulse sequences of the brain and surrounding structures were obtained without intravenous contrast. COMPARISON:  Head CT without contrast 2358 hours last night. Brain MRI 05/01/2021. FINDINGS: Brain: No restricted diffusion to suggest acute infarction. No midline shift, mass effect, evidence of mass lesion, ventriculomegaly, extra-axial collection or acute intracranial hemorrhage. Cervicomedullary junction and pituitary are within normal limits. Scattered and patchy but overall mild for age bilateral cerebral white matter T2 and FLAIR hyperintensity is stable since May. No cortical encephalomalacia identified. No definite chronic cerebral blood products. Deep gray matter nuclei, brainstem and  cerebellum remain normal for age. Vascular: Major intracranial vascular flow voids are stable. Skull and upper cervical spine: Negative. Sinuses/Orbits: Stable, negative. Other: Mastoids remain clear. Stable and grossly normal visible internal auditory structures. Stylomastoid foramina and parotid glands appear normal. IMPRESSION: 1. No acute intracranial abnormality. 2. Stable since May and largely normal for age noncontrast MRI appearance of the brain. Electronically Signed   By: Odessa Fleming M.D.   On: 10/26/2021 05:17   CT Head Wo Contrast  Result Date: 10/26/2021 CLINICAL DATA:  Dizziness. EXAM: CT HEAD  WITHOUT CONTRAST TECHNIQUE: Contiguous axial images were obtained from the base of the skull through the vertex without intravenous contrast. COMPARISON:  None. FINDINGS: Brain: There is mild cerebral atrophy with widening of the extra-axial spaces and ventricular dilatation. There are areas of decreased attenuation within the white matter tracts of the supratentorial brain, consistent with microvascular disease changes. Vascular: No hyperdense vessel or unexpected calcification. Skull: Normal. Negative for fracture or focal lesion. Sinuses/Orbits: No acute finding. Other: None. IMPRESSION: 1. Generalized cerebral atrophy. 2. No acute intracranial abnormality. Electronically Signed   By: Aram Candela M.D.   On: 10/26/2021 00:09       Assessment & Plan:  There are no diagnoses linked to this encounter.   Dale Bartow, MD

## 2023-10-27 ENCOUNTER — Encounter: Payer: Self-pay | Admitting: Internal Medicine

## 2023-10-27 DIAGNOSIS — I5189 Other ill-defined heart diseases: Secondary | ICD-10-CM | POA: Insufficient documentation

## 2023-10-27 NOTE — Assessment & Plan Note (Signed)
Low carb diet and exercise given elevated blood glucose. Follow met b and a1c.  

## 2023-10-27 NOTE — Assessment & Plan Note (Signed)
Low cholesterol diet and exercise.  Follow lipid panel and liver function tests.  Continue lipitor.

## 2023-10-27 NOTE — Assessment & Plan Note (Signed)
Seeing gyn.  Trial of another pessary.  Continue f/u with Dr Valentino Saxon.

## 2023-10-27 NOTE — Assessment & Plan Note (Signed)
ECHO with diastolic dysfunction.  History of SVT.  Discussed cardiopulmonary rehab.

## 2023-10-27 NOTE — Assessment & Plan Note (Signed)
Followed by cardiology.  Continue lopressor.  Overall stable.  Follow.  

## 2023-10-27 NOTE — Assessment & Plan Note (Signed)
Avoid antiinflammatories.  Continue to stay hydrated.  Follow metabolic panel.  

## 2023-10-27 NOTE — Assessment & Plan Note (Signed)
On lisinopril and metoprolol. Amlodipine 2.5mg  recently added.  Blood pressure as outlined.  Continue current medication regimen.  Spot check pressures.  Follow metabolic panel.

## 2023-10-27 NOTE — Assessment & Plan Note (Signed)
Continue lipitor and aspirin.  ?

## 2023-10-27 NOTE — Assessment & Plan Note (Signed)
Appears to be handling things relatively well.  Will notify me if feels needs further intervention. Follow. Plan for cardiopulmonary rehab.

## 2023-10-28 ENCOUNTER — Other Ambulatory Visit: Payer: Self-pay | Admitting: *Deleted

## 2023-10-28 DIAGNOSIS — I5032 Chronic diastolic (congestive) heart failure: Secondary | ICD-10-CM

## 2023-11-28 ENCOUNTER — Encounter: Payer: Medicare Other | Attending: Internal Medicine | Admitting: *Deleted

## 2023-11-28 DIAGNOSIS — I5032 Chronic diastolic (congestive) heart failure: Secondary | ICD-10-CM | POA: Insufficient documentation

## 2023-11-28 NOTE — Progress Notes (Signed)
Initial phone call completed. Diagnosis can be found in [facility] 11/1. EP Orientation scheduled for Monday 12/9 at 1:30.

## 2023-12-02 VITALS — Ht 61.5 in | Wt 144.3 lb

## 2023-12-02 DIAGNOSIS — I5032 Chronic diastolic (congestive) heart failure: Secondary | ICD-10-CM

## 2023-12-02 NOTE — Progress Notes (Signed)
Pulmonary Individual Treatment Plan  Patient Details  Name: Elizabeth [name] MRN: [id number] Date of Birth: [birth year] Referring Provider:   Flowsheet Row Pulmonary Rehab from 12/02/2023 in [facility] Cardiac and Pulmonary Rehab  Referring Provider Dr. Dale Lake Ketchum       Initial Encounter Date:  Flowsheet Row Pulmonary Rehab from 12/02/2023 in [facility] Cardiac and Pulmonary Rehab  Date 12/02/23       Visit Diagnosis: Heart failure, diastolic, chronic (HCC)  Patient's Home Medications on Admission:  Current Outpatient Medications:    amLODipine (NORVASC) 2.5 MG tablet, Take 1 tablet (2.5 mg total) by mouth daily., Disp: 90 tablet, Rfl: 3   aspirin 81 MG tablet, Take 81 mg by mouth daily., Disp: , Rfl:    atorvastatin (LIPITOR) 40 MG tablet, Take 1 tablet (40 mg total) by mouth daily., Disp: 90 tablet, Rfl: 3   cetirizine (ZYRTEC) 5 MG tablet, Take 5 mg by mouth daily as needed. , Disp: , Rfl:    lisinopril (ZESTRIL) 40 MG tablet, Take 1 tablet (40 mg total) by mouth daily., Disp: 90 tablet, Rfl: 3   meclizine (ANTIVERT) 12.5 MG tablet, Take 2 tablets (25 mg total) by mouth 3 (three) times daily as needed for dizziness., Disp: 30 tablet, Rfl: 0   metoprolol tartrate (LOPRESSOR) 25 MG tablet, Take 1 tablet (25 mg total) by mouth 2 (two) times daily., Disp: 180 tablet, Rfl: 3   OXYQUINOLONE SULFATE VAGINAL (TRIMO-SAN) 0.025 % GEL, Place 1 Applicatorful vaginally once a week., Disp: 113.4 g, Rfl: 3   triamcinolone (NASACORT) 55 MCG/ACT nasal inhaler, Place 2 sprays into the nose daily as needed. , Disp: , Rfl:   Past Medical History: Past Medical History:  Diagnosis Date   Cystocele or rectocele with incomplete uterine prolapse    Hyperlipidemia    Hypertension    Irritable bowel syndrome    Palpitations    Vaginal atrophy     Tobacco Use: Social History   Tobacco Use  Smoking Status Never  Smokeless Tobacco Never    Labs: Review Flowsheet  More data exists      Latest Ref Rng &  Units 08/28/2022 12/03/2022 03/07/2023 06/20/2023 10/22/2023  Labs for ITP Cardiac and Pulmonary Rehab  Cholestrol 0 - 200 mg/dL 841  324  401  027  253   LDL (calc) 0 - 99 mg/dL 95  98  - 79  98   Direct LDL mg/dL - - 664.4  - -  HDL-C >03.47 mg/dL 42.59  56.38  75.64  33.29  48.40   Trlycerides 0.0 - 149.0 mg/dL 518.8  416.6  063.0  160.1  164.0   Hemoglobin A1c 4.6 - 6.5 % 7.0  7.1  6.6  6.8  7.0     Details             Pulmonary Assessment Scores:  Pulmonary Assessment Scores     Row Name 12/02/23 1526         CAT Score   CAT Score 14              UCSD: Self-administered rating of dyspnea associated with activities of daily living (ADLs) 6-point scale (0 = "not at all" to 5 = "maximal or unable to do because of breathlessness")  Scoring Scores range from 0 to 120.  Minimally important difference is 5 units  CAT: CAT can identify the health impairment of COPD patients and is better correlated with disease progression.  CAT has a scoring range of zero to  40. The CAT score is classified into four groups of low (less than 10), medium (10 - 20), high (21-30) and very high (31-40) based on the impact level of disease on health status. A CAT score over 10 suggests significant symptoms.  A worsening CAT score could be explained by an exacerbation, poor medication adherence, poor inhaler technique, or progression of COPD or comorbid conditions.  CAT MCID is 2 points  mMRC: mMRC (Modified Medical Research Council) Dyspnea Scale is used to assess the degree of baseline functional disability in patients of respiratory disease due to dyspnea. No minimal important difference is established. A decrease in score of 1 point or greater is considered a positive change.   Pulmonary Function Assessment:   Exercise Target Goals: Exercise Program Goal: Individual exercise prescription set using results from initial 6 min walk test and THRR while considering  patient's activity barriers  and safety.   Exercise Prescription Goal: Initial exercise prescription builds to 30-45 minutes a day of aerobic activity, 2-3 days per week.  Home exercise guidelines will be given to patient during program as part of exercise prescription that the participant will acknowledge.  Education: Aerobic Exercise: - Group verbal and visual presentation on the components of exercise prescription. Introduces F.I.T.T principle from ACSM for exercise prescriptions.  Reviews F.I.T.T. principles of aerobic exercise including progression. Written material given at graduation. Flowsheet Row Pulmonary Rehab from 03/04/2019 in [facility] Cardiac and Pulmonary Rehab  Date 01/16/19  Educator [facility]  Instruction Review Code 1- Verbalizes Understanding       Education: Resistance Exercise: - Group verbal and visual presentation on the components of exercise prescription. Introduces F.I.T.T principle from ACSM for exercise prescriptions  Reviews F.I.T.T. principles of resistance exercise including progression. Written material given at graduation.    Education: Exercise & Equipment Safety: - Individual verbal instruction and demonstration of equipment use and safety with use of the equipment. Flowsheet Row Pulmonary Rehab from 03/04/2019 in [facility] Cardiac and Pulmonary Rehab  Date 01/06/19  Educator [facility]  Instruction Review Code 1- Verbalizes Understanding       Education: Exercise Physiology & General Exercise Guidelines: - Group verbal and written instruction with models to review the exercise physiology of the cardiovascular system and associated critical values. Provides general exercise guidelines with specific guidelines to those with heart or lung disease.  Flowsheet Row Pulmonary Rehab from 03/04/2019 in [facility] Cardiac and Pulmonary Rehab  Date 01/14/19  Educator [facility]  Instruction Review Code 1- Verbalizes Understanding       Education: Flexibility, Balance, Mind/Body Relaxation: - Group verbal and visual  presentation with interactive activity on the components of exercise prescription. Introduces F.I.T.T principle from ACSM for exercise prescriptions. Reviews F.I.T.T. principles of flexibility and balance exercise training including progression. Also discusses the mind body connection.  Reviews various relaxation techniques to help reduce and manage stress (i.e. Deep breathing, progressive muscle relaxation, and visualization). Balance handout provided to take home. Written material given at graduation. Flowsheet Row Pulmonary Rehab from 03/04/2019 in [facility] Cardiac and Pulmonary Rehab  Date 01/21/19  Educator [facility]  Instruction Review Code 1- Verbalizes Understanding       Activity Barriers & Risk Stratification:  Activity Barriers & Cardiac Risk Stratification - 12/02/23 1517       Activity Barriers & Cardiac Risk Stratification   Activity Barriers Balance Concerns;Shortness of Breath;Deconditioning;Muscular Weakness    Cardiac Risk Stratification Low             6 Minute Walk:  6 Minute  Walk     Row Name 12/02/23 1514         6 Minute Walk   Phase Initial     Distance 1420 feet     Walk Time 6 minutes     # of Rest Breaks 0     MPH 2.7     METS 1.97     RPE 11     Perceived Dyspnea  3     VO2 Peak 6.9     Symptoms No     Resting HR 70 bpm     Resting BP 152/68     Resting Oxygen Saturation  96 %     Exercise Oxygen Saturation  during 6 min walk 93 %     Max Ex. HR 96 bpm     Max Ex. BP 172/68     2 Minute Post BP 146/70       Interval HR   1 Minute HR 87     2 Minute HR 86     3 Minute HR 91     4 Minute HR 95     5 Minute HR 96     6 Minute HR 71     2 Minute Post HR 78     Interval Heart Rate? Yes       Interval Oxygen   Interval Oxygen? Yes     Baseline Oxygen Saturation % 96 %     1 Minute Oxygen Saturation % 95 %     2 Minute Oxygen Saturation % 94 %     3 Minute Oxygen Saturation % 93 %     4 Minute Oxygen Saturation % 94 %     5 Minute Oxygen  Saturation % 94 %     6 Minute Oxygen Saturation % 97 %     2 Minute Post Oxygen Saturation % 97 %             Oxygen Initial Assessment:  Oxygen Initial Assessment - 12/02/23 1529       Home Oxygen   Home Oxygen Device None    Sleep Oxygen Prescription None    Home Exercise Oxygen Prescription None    Home Resting Oxygen Prescription None    Compliance with Home Oxygen Use Yes      Initial 6 min Walk   Oxygen Used None      Program Oxygen Prescription   Program Oxygen Prescription None      Intervention   Short Term Goals To learn and understand importance of maintaining oxygen saturations>88%;To learn and demonstrate proper pursed lip breathing techniques or other breathing techniques. ;To learn and understand importance of monitoring SPO2 with pulse oximeter and demonstrate accurate use of the pulse oximeter.    Long  Term Goals Maintenance of O2 saturations>88%;Exhibits proper breathing techniques, such as pursed lip breathing or other method taught during program session;Verbalizes importance of monitoring SPO2 with pulse oximeter and return demonstration             Oxygen Re-Evaluation:   Oxygen Discharge (Final Oxygen Re-Evaluation):   Initial Exercise Prescription:  Initial Exercise Prescription - 12/02/23 1500       Date of Initial Exercise RX and Referring Provider   Date 12/02/23    Referring Provider Dr. Dale Sherman      Oxygen   Maintain Oxygen Saturation 88% or higher      Treadmill   MPH 2.5    Grade 0    Minutes  15    METs 2.91      Recumbant Bike   Level 2    RPM 50    Watts 25    Minutes 15    METs 1.97      NuStep   Level 2    SPM 80    Minutes 15    METs 1.97      REL-XR   Level 2    Watts 25    Speed 50    Minutes 15    METs 1.97      T5 Nustep   Level 2    SPM 80    Minutes 15    METs 1.97      Track   Laps 19    Minutes 15    METs 2.09      Intensity   THRR 40-80% of Max Heartrate 92-114    Ratings  of Perceived Exertion 11-13    Perceived Dyspnea 0-4      Progression   Progression Continue to progress workloads to maintain intensity without signs/symptoms of physical distress.      Resistance Training   Training Prescription Yes    Weight 3 lb    Reps 10-15             Perform Capillary Blood Glucose checks as needed.  Exercise Prescription Changes:   Exercise Prescription Changes     Row Name 12/02/23 1500             Response to Exercise   Blood Pressure (Admit) 152/68       Blood Pressure (Exercise) 172/68       Blood Pressure (Exit) 146/70       Heart Rate (Admit) 70 bpm       Heart Rate (Exercise) 96 bpm       Heart Rate (Exit) 78 bpm       Oxygen Saturation (Admit) 96 %       Oxygen Saturation (Exercise) 93 %       Oxygen Saturation (Exit) 97 %       Rating of Perceived Exertion (Exercise) 11       Perceived Dyspnea (Exercise) 3       Symptoms none       Comments results         Interval Training   Interval Training No                Exercise Comments:   Exercise Goals and Review:   Exercise Goals     Row Name 12/02/23 1522             Exercise Goals   Increase Physical Activity Yes       Intervention Develop an individualized exercise prescription for aerobic and resistive training based on initial evaluation findings, risk stratification, comorbidities and participant's personal goals.;Provide advice, education, support and counseling about physical activity/exercise needs.       Expected Outcomes Long Term: Exercising regularly at least 3-5 days a week.;Long Term: Add in home exercise to make exercise part of routine and to increase amount of physical activity.;Short Term: Attend rehab on a regular basis to increase amount of physical activity.       Increase Strength and Stamina Yes       Intervention Develop an individualized exercise prescription for aerobic and resistive training based on initial evaluation findings, risk  stratification, comorbidities and participant's personal goals.;Provide advice, education, support and counseling about physical activity/exercise needs.  Expected Outcomes Long Term: Improve cardiorespiratory fitness, muscular endurance and strength as measured by increased METs and functional capacity ( );Short Term: Perform resistance training exercises routinely during rehab and add in resistance training at home;Short Term: Increase workloads from initial exercise prescription for resistance, speed, and METs.       Able to understand and use rate of perceived exertion (RPE) scale Yes       Intervention Provide education and explanation on how to use RPE scale       Expected Outcomes Long Term:  Able to use RPE to guide intensity level when exercising independently;Short Term: Able to use RPE daily in rehab to express subjective intensity level       Able to understand and use Dyspnea scale Yes       Intervention Provide education and explanation on how to use Dyspnea scale       Expected Outcomes Long Term: Able to use Dyspnea scale to guide intensity level when exercising independently;Short Term: Able to use Dyspnea scale daily in rehab to express subjective sense of shortness of breath during exertion       Knowledge and understanding of Target Heart Rate Range (THRR) Yes       Intervention Provide education and explanation of THRR including how the numbers were predicted and where they are located for reference       Expected Outcomes Long Term: Able to use THRR to govern intensity when exercising independently;Short Term: Able to use daily as guideline for intensity in rehab;Short Term: Able to state/look up THRR       Able to check pulse independently Yes       Intervention Review the importance of being able to check your own pulse for safety during independent exercise;Provide education and demonstration on how to check pulse in carotid and radial arteries.       Expected Outcomes  Long Term: Able to check pulse independently and accurately;Short Term: Able to explain why pulse checking is important during independent exercise       Understanding of Exercise Prescription Yes       Intervention Provide education, explanation, and written materials on patient's individual exercise prescription       Expected Outcomes Long Term: Able to explain home exercise prescription to exercise independently;Short Term: Able to explain program exercise prescription                Exercise Goals Re-Evaluation :   Discharge Exercise Prescription (Final Exercise Prescription Changes):  Exercise Prescription Changes - 12/02/23 1500       Response to Exercise   Blood Pressure (Admit) 152/68    Blood Pressure (Exercise) 172/68    Blood Pressure (Exit) 146/70    Heart Rate (Admit) 70 bpm    Heart Rate (Exercise) 96 bpm    Heart Rate (Exit) 78 bpm    Oxygen Saturation (Admit) 96 %    Oxygen Saturation (Exercise) 93 %    Oxygen Saturation (Exit) 97 %    Rating of Perceived Exertion (Exercise) 11    Perceived Dyspnea (Exercise) 3    Symptoms none    Comments results      Interval Training   Interval Training No             Nutrition:  Target Goals: Understanding of nutrition guidelines, daily intake of sodium 1500mg , cholesterol 200mg , calories 30% from fat and 7% or less from saturated fats, daily to have 5 or more servings of fruits and  vegetables.  Education: All About Nutrition: -Group instruction provided by verbal, written material, interactive activities, discussions, models, and posters to present general guidelines for heart healthy nutrition including fat, fiber, MyPlate, the role of sodium in heart healthy nutrition, utilization of the nutrition label, and utilization of this knowledge for meal planning. Follow up email sent as well. Written material given at graduation. Flowsheet Row Pulmonary Rehab from 03/04/2019 in [facility] Cardiac and Pulmonary Rehab   Date 02/04/19  Educator [facility]  Instruction Review Code 1- Verbalizes Understanding       Biometrics:  Pre Biometrics - 12/02/23 1523       Pre Biometrics   Height 5' 1.5" (1.562 m)    Weight 144 lb 4.8 oz (65.5 kg)    Waist Circumference 34.5 inches    Hip Circumference 40.5 inches    Waist to Hip Ratio 0.85 %    BMI (Calculated) 26.83    Single Leg Stand 6.7 seconds              Nutrition Therapy Plan and Nutrition Goals:   Nutrition Assessments:  MEDIFICTS Score Key: >=70 Need to make dietary changes  40-70 Heart Healthy Diet <= 40 Therapeutic Level Cholesterol Diet   Picture Your Plate Scores: <40 Unhealthy dietary pattern with much room for improvement. 41-50 Dietary pattern unlikely to meet recommendations for good health and room for improvement. 51-60 More healthful dietary pattern, with some room for improvement.  >60 Healthy dietary pattern, although there may be some specific behaviors that could be improved.   Nutrition Goals Re-Evaluation:   Nutrition Goals Discharge (Final Nutrition Goals Re-Evaluation):   Psychosocial: Target Goals: Acknowledge presence or absence of significant depression and/or stress, maximize coping skills, provide positive support system. Participant is able to verbalize types and ability to use techniques and skills needed for reducing stress and depression.   Education: Stress, Anxiety, and Depression - Group verbal and visual presentation to define topics covered.  Reviews how body is impacted by stress, anxiety, and depression.  Also discusses healthy ways to reduce stress and to treat/manage anxiety and depression.  Written material given at graduation. Flowsheet Row Pulmonary Rehab from 03/04/2019 in [facility] Cardiac and Pulmonary Rehab  Date 02/25/19  Educator [facility]  Instruction Review Code 1- [employer] Understanding       Education: Sleep Hygiene -Provides group verbal and written instruction about how sleep can  affect your health.  Define sleep hygiene, discuss sleep cycles and impact of sleep habits. Review good sleep hygiene tips.    Initial Review & Psychosocial Screening:  Initial Psych Review & Screening - 11/28/23 1348       Initial Review   Current issues with History of Depression      Family Dynamics   Good Support System? Yes      Barriers   Psychosocial barriers to participate in program The patient should benefit from training in stress management and relaxation.      Screening Interventions   Interventions Encouraged to exercise;To provide support and resources with identified psychosocial needs;Provide feedback about the scores to participant    Expected Outcomes Short Term goal: Utilizing psychosocial counselor, staff and physician to assist with identification of specific Stressors or current issues interfering with healing process. Setting desired goal for each stressor or current issue identified.;Long Term Goal: Stressors or current issues are controlled or eliminated.;Short Term goal: Identification and review with participant of any Quality of Life or Depression concerns found by scoring the questionnaire.;Long Term goal: The participant  improves quality of Life and PHQ9 Scores as seen by post scores and/or verbalization of changes             Quality of Life Scores:  Scores of 19 and below usually indicate a poorer quality of life in these areas.  A difference of  2-3 points is a clinically meaningful difference.  A difference of 2-3 points in the total score of the Quality of Life Index has been associated with significant improvement in overall quality of life, self-image, physical symptoms, and general health in studies assessing change in quality of life.  PHQ-9: Review Flowsheet  More data exists      12/02/2023 06/24/2023 12/06/2022 10/02/2022 09/03/2022  Depression screen PHQ 2/9  Decreased Interest 1 0 0 0 0  Down, Depressed, Hopeless 0 0 0 0 0  PHQ - 2 Score  1 0 0 0 0  Altered sleeping 0 2 1 - -  Tired, decreased energy 1 0 1 - -  Change in appetite 0 0 0 - -  Feeling bad or failure about yourself  0 0 0 - -  Trouble concentrating 0 0 0 - -  Moving slowly or fidgety/restless 0 0 0 - -  Suicidal thoughts 0 0 0 - -  PHQ-9 Score 2 2 2  - -  Difficult doing work/chores Not difficult at all Not difficult at all Not difficult at all - -    Details           Interpretation of Total Score  Total Score Depression Severity:  1-4 = Minimal depression, 5-9 = Mild depression, 10-14 = Moderate depression, 15-19 = Moderately severe depression, 20-27 = Severe depression   Psychosocial Evaluation and Intervention:  Psychosocial Evaluation - 11/28/23 1349       Psychosocial Evaluation & Interventions   Interventions Encouraged to exercise with the program and follow exercise prescription    Comments Shanine is coming to pulmonary rehab with heart failure. She did the program in early 2020, but stopped due to the beginning of Covid. She states she stays very active for a woman in her 90s. She enjoys painting and walking. She recently moved to the independent living at [facility]. She says she just finished PT there for her ongoing vertigo problems and really enjoyed the staff there. She wants to get more active since she stayed indoors a lot during covid and is looking forward to attending the program. She does not report any stress concerns today, says she takes each day as it comes. She is motivated to attend the program so she can be udner supervision as she starts increasing her activity level.    Expected Outcomes Short: attend cardiac rehab for educaiton and exercise. Long; Develop and maintain positive self care habits    Continue Psychosocial Services  Follow up required by staff             Psychosocial Re-Evaluation:   Psychosocial Discharge (Final Psychosocial Re-Evaluation):   Education: Education Goals: Education classes will be  provided on a weekly basis, covering required topics. Participant will state understanding/return demonstration of topics presented.  Learning Barriers/Preferences:  Learning Barriers/Preferences - 11/28/23 1333       Learning Barriers/Preferences   Learning Barriers Hearing    Learning Preferences Individual Instruction             General Pulmonary Education Topics:  Infection Prevention: - Provides verbal and written material to individual with discussion of infection control including proper hand washing and  proper equipment cleaning during exercise session. Flowsheet Row Pulmonary Rehab from 03/04/2019 in [facility] Cardiac and Pulmonary Rehab  Date 01/06/19  Educator [facility]  Instruction Review Code 1- Verbalizes Understanding       Falls Prevention: - Provides verbal and written material to individual with discussion of falls prevention and safety. Flowsheet Row Pulmonary Rehab from 03/04/2019 in [facility] Cardiac and Pulmonary Rehab  Date 01/06/19  Educator [facility]  Instruction Review Code 1- Verbalizes Understanding       Chronic Lung Disease Review: - Group verbal instruction with posters, models, PowerPoint presentations and videos,  to review new updates, new respiratory medications, new advancements in procedures and treatments. Providing information on websites and "800" numbers for continued self-education. Includes information about supplement oxygen, available portable oxygen systems, continuous and intermittent flow rates, oxygen safety, concentrators, and Medicare reimbursement for oxygen. Explanation of Pulmonary Drugs, including class, frequency, complications, importance of spacers, rinsing mouth after steroid MDI's, and proper cleaning methods for nebulizers. Review of basic lung anatomy and physiology related to function, structure, and complications of lung disease. Review of risk factors. Discussion about methods for diagnosing sleep apnea and types of masks and machines for  OSA. Includes a review of the use of types of environmental controls: home humidity, furnaces, filters, dust mite/pet prevention, HEPA vacuums. Discussion about weather changes, air quality and the benefits of nasal washing. Instruction on Warning signs, infection symptoms, calling MD promptly, preventive modes, and value of vaccinations. Review of effective airway clearance, coughing and/or vibration techniques. Emphasizing that all should Create an Action Plan. Written material given at graduation. Flowsheet Row Pulmonary Rehab from 03/04/2019 in [facility] Cardiac and Pulmonary Rehab  Date 01/30/19  Educator [facility]  Instruction Review Code 1- Verbalizes Understanding       AED/CPR: - Group verbal and written instruction with the use of models to demonstrate the basic use of the AED with the basic ABC's of resuscitation.    Anatomy and Cardiac Procedures: - Group verbal and visual presentation and models provide information about basic cardiac anatomy and function. Reviews the testing methods done to diagnose heart disease and the outcomes of the test results. Describes the treatment choices: Medical Management, Angioplasty, or Coronary Bypass Surgery for treating various heart conditions including Myocardial Infarction, Angina, Valve Disease, and Cardiac Arrhythmias.  Written material given at graduation.   Medication Safety: - Group verbal and visual instruction to review commonly prescribed medications for heart and lung disease. Reviews the medication, class of the drug, and side effects. Includes the steps to properly store meds and maintain the prescription regimen.  Written material given at graduation.   Other: -Provides group and verbal instruction on various topics (see comments)   Knowledge Questionnaire Score:    Core Components/Risk Factors/Patient Goals at Admission:  Personal Goals and Risk Factors at Admission - 11/28/23 1332       Core Components/Risk Factors/Patient Goals on  Admission   Improve shortness of breath with ADL's Yes    Intervention Provide education, individualized exercise plan and daily activity instruction to help decrease symptoms of SOB with activities of daily living.    Expected Outcomes Short Term: Improve cardiorespiratory fitness to achieve a reduction of symptoms when performing ADLs;Long Term: Be able to perform more ADLs without symptoms or delay the onset of symptoms    Diabetes Yes   Diet controlled   Intervention Provide education about signs/symptoms and action to take for hypo/hyperglycemia.;Provide education about proper nutrition, including hydration, and aerobic/resistive exercise prescription along with  prescribed medications to achieve blood glucose in normal ranges: Fasting glucose 65-99 mg/dL    Expected Outcomes Short Term: Participant verbalizes understanding of the signs/symptoms and immediate care of hyper/hypoglycemia, proper foot care and importance of medication, aerobic/resistive exercise and nutrition plan for blood glucose control.;Long Term: Attainment of HbA1C < 7%.    Hypertension Yes    Intervention Provide education on lifestyle modifcations including regular physical activity/exercise, weight management, moderate sodium restriction and increased consumption of fresh fruit, vegetables, and low fat dairy, alcohol moderation, and smoking cessation.;Monitor prescription use compliance.    Expected Outcomes Short Term: Continued assessment and intervention until BP is < 140/58mm HG in hypertensive participants. < 130/39mm HG in hypertensive participants with diabetes, heart failure or chronic kidney disease.;Long Term: Maintenance of blood pressure at goal levels.    Lipids Yes    Intervention Provide education and support for participant on nutrition & aerobic/resistive exercise along with prescribed medications to achieve LDL 70mg , HDL >40mg .    Expected Outcomes Short Term: Participant states understanding of desired  cholesterol values and is compliant with medications prescribed. Participant is following exercise prescription and nutrition guidelines.;Long Term: Cholesterol controlled with medications as prescribed, with individualized exercise RX and with personalized nutrition plan. Value goals: LDL < 70mg , HDL > 40 mg.             Education:Diabetes - Individual verbal and written instruction to review signs/symptoms of diabetes, desired ranges of glucose level fasting, after meals and with exercise. Acknowledge that pre and post exercise glucose checks will be done for 3 sessions at entry of program.   Know Your Numbers and Heart Failure: - Group verbal and visual instruction to discuss disease risk factors for cardiac and pulmonary disease and treatment options.  Reviews associated critical values for Overweight/Obesity, Hypertension, Cholesterol, and Diabetes.  Discusses basics of heart failure: signs/symptoms and treatments.  Introduces Heart Failure Zone chart for action plan for heart failure.  Written material given at graduation.   Core Components/Risk Factors/Patient Goals Review:    Core Components/Risk Factors/Patient Goals at Discharge (Final Review):    ITP Comments:  ITP Comments     Row Name 11/28/23 1339 12/02/23 1513         ITP Comments Initial phone call completed. Diagnosis can be [facility] in [facility] 11/[facility]. EP Orientation scheduled for Monday 12/9 at 1:30. Completed and gym orientation. Initial ITP created and sent for review to Dr. Jinny Sanders, Medical Director.               Comments: Initial ITP

## 2023-12-02 NOTE — Patient Instructions (Signed)
Patient Instructions  Patient Details  Name: Elizabeth [name] MRN: [id number] Date of Birth: 02/06/1927 Referring Provider:  Dale Trenton, MD  Below are your personal goals for exercise, nutrition, and risk factors. Our goal is to help you stay on track towards obtaining and maintaining these goals. We will be discussing your progress on these goals with you throughout the program.  Initial Exercise Prescription:  Initial Exercise Prescription - 12/02/23 1500       Date of Initial Exercise RX and Referring Provider   Date 12/02/23    Referring Provider Dr. Dale Conway      Oxygen   Maintain Oxygen Saturation 88% or higher      Treadmill   MPH 2.5    Grade 0    Minutes 15    METs 2.91      Recumbant Bike   Level 2    RPM 50    Watts 25    Minutes 15    METs 1.97      NuStep   Level 2    SPM 80    Minutes 15    METs 1.97      REL-XR   Level 2    Watts 25    Speed 50    Minutes 15    METs 1.97      T5 Nustep   Level 2    SPM 80    Minutes 15    METs 1.97      Track   Laps 19    Minutes 15    METs 2.09      Intensity   THRR 40-80% of Max Heartrate 92-114    Ratings of Perceived Exertion 11-13    Perceived Dyspnea 0-4      Progression   Progression Continue to progress workloads to maintain intensity without signs/symptoms of physical distress.      Resistance Training   Training Prescription Yes    Weight 3 lb    Reps 10-15             Exercise Goals: Frequency: Be able to perform aerobic exercise two to three times per week in program working toward 2-5 days per week of home exercise.  Intensity: Work with a perceived exertion of 11 (fairly light) - 15 (hard) while following your exercise prescription.  We will make changes to your prescription with you as you progress through the program.   Duration: Be able to do 30 to 45 minutes of continuous aerobic exercise in addition to a 5 minute warm-up and a 5 minute cool-down routine.    Nutrition Goals: Your personal nutrition goals will be established when you do your nutrition analysis with the dietician.  The following are general nutrition guidelines to follow: Cholesterol < 200mg /day Sodium < 1500mg /day Fiber: Women over 50 yrs - 21 grams per day  Personal Goals:  Personal Goals and Risk Factors at Admission - 11/28/23 1332       Core Components/Risk Factors/Patient Goals on Admission   Improve shortness of breath with ADL's Yes    Intervention Provide education, individualized exercise plan and daily activity instruction to help decrease symptoms of SOB with activities of daily living.    Expected Outcomes Short Term: Improve cardiorespiratory fitness to achieve a reduction of symptoms when performing ADLs;Long Term: Be able to perform more ADLs without symptoms or delay the onset of symptoms    Diabetes Yes   Diet controlled   Intervention Provide education about signs/symptoms and  action to take for hypo/hyperglycemia.;Provide education about proper nutrition, including hydration, and aerobic/resistive exercise prescription along with prescribed medications to achieve blood glucose in normal ranges: Fasting glucose 65-99 mg/dL    Expected Outcomes Short Term: Participant verbalizes understanding of the signs/symptoms and immediate care of hyper/hypoglycemia, proper foot care and importance of medication, aerobic/resistive exercise and nutrition plan for blood glucose control.;Long Term: Attainment of HbA1C < 7%.    Hypertension Yes    Intervention Provide education on lifestyle modifcations including regular physical activity/exercise, weight management, moderate sodium restriction and increased consumption of fresh fruit, vegetables, and low fat dairy, alcohol moderation, and smoking cessation.;Monitor prescription use compliance.    Expected Outcomes Short Term: Continued assessment and intervention until BP is < 140/73mm HG in hypertensive participants. < 130/4mm  HG in hypertensive participants with diabetes, heart failure or chronic kidney disease.;Long Term: Maintenance of blood pressure at goal levels.    Lipids Yes    Intervention Provide education and support for participant on nutrition & aerobic/resistive exercise along with prescribed medications to achieve LDL 70mg , HDL >40mg .    Expected Outcomes Short Term: Participant states understanding of desired cholesterol values and is compliant with medications prescribed. Participant is following exercise prescription and nutrition guidelines.;Long Term: Cholesterol controlled with medications as prescribed, with individualized exercise RX and with personalized nutrition plan. Value goals: LDL < 70mg , HDL > 40 mg.             Exercise Goals and Review:  Exercise Goals     Row Name 12/02/23 1522             Exercise Goals   Increase Physical Activity Yes       Intervention Develop an individualized exercise prescription for aerobic and resistive training based on initial evaluation findings, risk stratification, comorbidities and participant's personal goals.;Provide advice, education, support and counseling about physical activity/exercise needs.       Expected Outcomes Long Term: Exercising regularly at least 3-5 days a week.;Long Term: Add in home exercise to make exercise part of routine and to increase amount of physical activity.;Short Term: Attend rehab on a regular basis to increase amount of physical activity.       Increase Strength and Stamina Yes       Intervention Develop an individualized exercise prescription for aerobic and resistive training based on initial evaluation findings, risk stratification, comorbidities and participant's personal goals.;Provide advice, education, support and counseling about physical activity/exercise needs.       Expected Outcomes Long Term: Improve cardiorespiratory fitness, muscular endurance and strength as measured by increased METs and functional  capacity ( );Short Term: Perform resistance training exercises routinely during rehab and add in resistance training at home;Short Term: Increase workloads from initial exercise prescription for resistance, speed, and METs.       Able to understand and use rate of perceived exertion (RPE) scale Yes       Intervention Provide education and explanation on how to use RPE scale       Expected Outcomes Long Term:  Able to use RPE to guide intensity level when exercising independently;Short Term: Able to use RPE daily in rehab to express subjective intensity level       Able to understand and use Dyspnea scale Yes       Intervention Provide education and explanation on how to use Dyspnea scale       Expected Outcomes Long Term: Able to use Dyspnea scale to guide intensity level when exercising independently;Short Term: Able  to use Dyspnea scale daily in rehab to express subjective sense of shortness of breath during exertion       Knowledge and understanding of Target Heart Rate Range (THRR) Yes       Intervention Provide education and explanation of THRR including how the numbers were predicted and where they are located for reference       Expected Outcomes Long Term: Able to use THRR to govern intensity when exercising independently;Short Term: Able to use daily as guideline for intensity in rehab;Short Term: Able to state/look up THRR       Able to check pulse independently Yes       Intervention Review the importance of being able to check your own pulse for safety during independent exercise;Provide education and demonstration on how to check pulse in carotid and radial arteries.       Expected Outcomes Long Term: Able to check pulse independently and accurately;Short Term: Able to explain why pulse checking is important during independent exercise       Understanding of Exercise Prescription Yes       Intervention Provide education, explanation, and written materials on patient's individual  exercise prescription       Expected Outcomes Long Term: Able to explain home exercise prescription to exercise independently;Short Term: Able to explain program exercise prescription                Copy of goals given to participant.

## 2023-12-04 ENCOUNTER — Encounter: Payer: Medicare Other | Admitting: *Deleted

## 2023-12-04 DIAGNOSIS — I5032 Chronic diastolic (congestive) heart failure: Secondary | ICD-10-CM

## 2023-12-04 NOTE — Progress Notes (Signed)
Daily Session Note  Patient Details  Name: Elizabeth [name] MRN: [id number] Date of Birth: April 08, 1927 Referring Provider:   Flowsheet Row Pulmonary Rehab from 12/02/2023 in [facility] Cardiac and Pulmonary Rehab  Referring Provider Dr. Dale [place]       Encounter Date: 12/04/2023  Check In:  Session Check In - 12/04/23 1403       Check-In   Supervising physician immediately available to respond to emergencies See telemetry face sheet for immediately available ER MD    Location [name]    Staff Present Susann Givens, RN BSN;Susanne Bice, RN, BSN, Matthew Saras, BS, ACSM CEP, Exercise Physiologist;Megan Katrinka Blazing, RN, ADN    Virtual Visit No    Medication changes reported     No    Fall or balance concerns reported    No    Warm-up and Cool-down Performed on first and last piece of equipment    Resistance Training Performed Yes    VAD Patient? No    PAD/SET Patient? No      Pain Assessment   Currently in Pain? No/denies                Social History   Tobacco Use  Smoking Status Never  Smokeless Tobacco Never    Goals Met:  Proper associated with RPD/PD & O2 Sat Independence with exercise equipment Using PLB without cueing & demonstrates good technique Exercise tolerated well No report of concerns or symptoms today Strength training completed today  Goals Unmet:  Not Applicable  Comments: First full day of exercise!  Patient was oriented to gym and equipment including functions, settings, policies, and procedures.  Patient's individual exercise prescription and treatment plan were reviewed.  All starting workloads were established based on the results of the 6 minute walk test done at initial orientation visit.  The plan for exercise progression was also introduced and progression will be customized based on patient's performance and goals.    Dr. Bethann Punches is Medical Director for [facility] Cardiac Rehabilitation.  Dr. Vida Rigger  is Medical Director for [facility] Pulmonary Rehabilitation.

## 2023-12-05 ENCOUNTER — Encounter: Payer: Medicare Other | Admitting: *Deleted

## 2023-12-05 DIAGNOSIS — I5032 Chronic diastolic (congestive) heart failure: Secondary | ICD-10-CM | POA: Diagnosis not present

## 2023-12-05 NOTE — Progress Notes (Signed)
Daily Session Note  Patient Details  Name: Elizabeth [name] MRN: [id number] Date of Birth: 07-05-1927 Referring Provider:   Flowsheet Row Pulmonary Rehab from 12/02/2023 in [facility] Cardiac and Pulmonary Rehab  Referring Provider Dr. Dale Stephenson       Encounter Date: 12/05/2023  Check In:  Session Check In - 12/05/23 1353       Check-In   Supervising physician immediately available to respond to emergencies See telemetry face sheet for immediately available ER MD    Location [name]    Staff Present Ronette Deter, BS, Exercise Physiologist;Susanne Bice, RN, BSN, CCRP;Maxon Conetta BS, , Exercise Physiologist;Sosha Shepherd Katrinka Blazing, RN, ADN    Virtual Visit No    Medication changes reported     No    Fall or balance concerns reported    No    Warm-up and Cool-down Performed on first and last piece of equipment    Resistance Training Performed Yes    VAD Patient? No    PAD/SET Patient? No      Pain Assessment   Currently in Pain? No/denies                Social History   Tobacco Use  Smoking Status Never  Smokeless Tobacco Never    Goals Met:  Independence with exercise equipment Exercise tolerated well No report of concerns or symptoms today Strength training completed today  Goals Unmet:  Not Applicable  Comments: Pt able to follow exercise prescription today without complaint.  Will continue to monitor for progression.    Dr. Bethann Punches is Medical Director for [facility] Cardiac Rehabilitation.  Dr. Vida Rigger is Medical Director for [facility] Pulmonary Rehabilitation.

## 2023-12-09 ENCOUNTER — Encounter: Payer: Medicare Other | Admitting: *Deleted

## 2023-12-09 DIAGNOSIS — I5032 Chronic diastolic (congestive) heart failure: Secondary | ICD-10-CM | POA: Diagnosis not present

## 2023-12-09 NOTE — Progress Notes (Signed)
Daily Session Note  Patient Details  Name: Elizabeth [name] MRN: [id number] Date of Birth: [birth year] Referring Provider:   Flowsheet Row Pulmonary Rehab from 12/02/2023 in [facility] Cardiac and Pulmonary Rehab  Referring Provider Dr. Dale [place]       Encounter Date: 12/09/2023  Check In:  Session Check In - 12/09/23 1404       Check-In   Supervising physician immediately available to respond to emergencies See telemetry face sheet for immediately available ER MD    Location [name]    Staff Present Ronette Deter, BS, Exercise Physiologist;Joseph [place], RCP,RRT,BSRT;Maxon Wagener BS, , Exercise Physiologist;Wally Behan Katrinka Blazing, RN, ADN    Virtual Visit No    Medication changes reported     No    Fall or balance concerns reported    No    Warm-up and Cool-down Performed on first and last piece of equipment    Resistance Training Performed Yes    VAD Patient? No    PAD/SET Patient? No      Pain Assessment   Currently in Pain? No/denies                Social History   Tobacco Use  Smoking Status Never  Smokeless Tobacco Never    Goals Met:  Independence with exercise equipment Exercise tolerated well No report of concerns or symptoms today Strength training completed today  Goals Unmet:  Not Applicable  Comments: Pt able to follow exercise prescription today without complaint.  Will continue to monitor for progression.    Dr. Bethann Punches is Medical Director for [facility] Cardiac Rehabilitation.  Dr. Vida Rigger is Medical Director for [facility] Pulmonary Rehabilitation.

## 2023-12-11 ENCOUNTER — Encounter: Payer: Self-pay | Admitting: *Deleted

## 2023-12-11 ENCOUNTER — Encounter: Payer: Medicare Other | Admitting: *Deleted

## 2023-12-11 DIAGNOSIS — I5032 Chronic diastolic (congestive) heart failure: Secondary | ICD-10-CM | POA: Diagnosis not present

## 2023-12-11 NOTE — Progress Notes (Signed)
Pulmonary Individual Treatment Plan  Patient Details  Name: Elizabeth [name] MRN: [id number] Date of Birth: May 28, 1927 Referring Provider:   Flowsheet Row Pulmonary Rehab from 12/02/2023 in [facility] Cardiac and Pulmonary Rehab  Referring Provider Dr. Dale Hayti       Initial Encounter Date:  Flowsheet Row Pulmonary Rehab from 12/02/2023 in [facility] Cardiac and Pulmonary Rehab  Date 12/02/23       Visit Diagnosis: Heart failure, diastolic, chronic (HCC)  Patient's Home Medications on Admission:  Current Outpatient Medications:    amLODipine (NORVASC) 2.5 MG tablet, Take 1 tablet (2.5 mg total) by mouth daily., Disp: 90 tablet, Rfl: 3   aspirin 81 MG tablet, Take 81 mg by mouth daily., Disp: , Rfl:    atorvastatin (LIPITOR) 40 MG tablet, Take 1 tablet (40 mg total) by mouth daily., Disp: 90 tablet, Rfl: 3   cetirizine (ZYRTEC) 5 MG tablet, Take 5 mg by mouth daily as needed. , Disp: , Rfl:    lisinopril (ZESTRIL) 40 MG tablet, Take 1 tablet (40 mg total) by mouth daily., Disp: 90 tablet, Rfl: 3   meclizine (ANTIVERT) 12.5 MG tablet, Take 2 tablets (25 mg total) by mouth 3 (three) times daily as needed for dizziness., Disp: 30 tablet, Rfl: 0   metoprolol tartrate (LOPRESSOR) 25 MG tablet, Take 1 tablet (25 mg total) by mouth 2 (two) times daily., Disp: 180 tablet, Rfl: 3   OXYQUINOLONE SULFATE VAGINAL (TRIMO-SAN) 0.025 % GEL, Place 1 Applicatorful vaginally once a week., Disp: 113.4 g, Rfl: 3   triamcinolone (NASACORT) 55 MCG/ACT nasal inhaler, Place 2 sprays into the nose daily as needed. , Disp: , Rfl:   Past Medical History: Past Medical History:  Diagnosis Date   Cystocele or rectocele with incomplete uterine prolapse    Hyperlipidemia    Hypertension    Irritable bowel syndrome    Palpitations    Vaginal atrophy     Tobacco Use: Social History   Tobacco Use  Smoking Status Never  Smokeless Tobacco Never    Labs: Review Flowsheet  More data exists      Latest Ref Rng &  Units 08/28/2022 12/03/2022 03/07/2023 06/20/2023 10/22/2023  Labs for ITP Cardiac and Pulmonary Rehab  Cholestrol 0 - 200 mg/dL 102  725  366  440  347   LDL (calc) 0 - 99 mg/dL 95  98  - 79  98   Direct LDL mg/dL - - 425.9  - -  HDL-C >56.38 mg/dL 75.64  33.29  51.88  41.66  48.40   Trlycerides 0.0 - 149.0 mg/dL 063.0  160.1  093.2  355.7  164.0   Hemoglobin A1c 4.6 - 6.5 % 7.0  7.1  6.6  6.8  7.0      Pulmonary Assessment Scores:  Pulmonary Assessment Scores     Row Name 12/02/23 1526 12/04/23 1355       ADL UCSD   ADL Phase -- Entry    SOB Score total -- 2    Rest -- 1    Walk -- 0    Stairs -- 1    Bath -- 0    Dress -- 0    Shop -- 0      CAT Score   CAT Score 14 --      mMRC Score   mMRC Score -- 1             UCSD: Self-administered rating of dyspnea associated with activities of daily living (ADLs) 6-point scale (  0 = "not at all" to 5 = "maximal or unable to do because of breathlessness")  Scoring Scores range from 0 to 120.  Minimally important difference is 5 units  CAT: CAT can identify the health impairment of COPD patients and is better correlated with disease progression.  CAT has a scoring range of zero to 40. The CAT score is classified into four groups of low (less than 10), medium (10 - 20), high (21-30) and very high (31-40) based on the impact level of disease on health status. A CAT score over 10 suggests significant symptoms.  A worsening CAT score could be explained by an exacerbation, poor medication adherence, poor inhaler technique, or progression of COPD or comorbid conditions.  CAT MCID is 2 points  mMRC: mMRC (Modified Medical Research Council) Dyspnea Scale is used to assess the degree of baseline functional disability in patients of respiratory disease due to dyspnea. No minimal important difference is established. A decrease in score of 1 point or greater is considered a positive change.   Pulmonary Function Assessment:   Exercise  Target Goals: Exercise Program Goal: Individual exercise prescription set using results from initial 6 min walk test and THRR while considering  patient's activity barriers and safety.   Exercise Prescription Goal: Initial exercise prescription builds to 30-45 minutes a day of aerobic activity, 2-3 days per week.  Home exercise guidelines will be given to patient during program as part of exercise prescription that the participant will acknowledge.  Education: Aerobic Exercise: - Group verbal and visual presentation on the components of exercise prescription. Introduces F.I.T.T principle from ACSM for exercise prescriptions.  Reviews F.I.T.T. principles of aerobic exercise including progression. Written material given at graduation. Flowsheet Row Pulmonary Rehab from 03/04/2019 in [facility] Cardiac and Pulmonary Rehab  Date 01/16/19  Educator [facility]  Instruction Review Code 1- Verbalizes Understanding       Education: Resistance Exercise: - Group verbal and visual presentation on the components of exercise prescription. Introduces F.I.T.T principle from ACSM for exercise prescriptions  Reviews F.I.T.T. principles of resistance exercise including progression. Written material given at graduation.    Education: Exercise & Equipment Safety: - Individual verbal instruction and demonstration of equipment use and safety with use of the equipment. Flowsheet Row Pulmonary Rehab from 12/04/2023 in [facility] Cardiac and Pulmonary Rehab  Date 12/04/23  Educator [facility]  Instruction Review Code 1- Verbalizes Understanding       Education: Exercise Physiology & General Exercise Guidelines: - Group verbal and written instruction with models to review the exercise physiology of the cardiovascular system and associated critical values. Provides general exercise guidelines with specific guidelines to those with heart or lung disease.  Flowsheet Row Pulmonary Rehab from 12/04/2023 in [facility] Cardiac and Pulmonary Rehab   Education need identified 12/04/23       Education: Flexibility, Balance, Mind/Body Relaxation: - Group verbal and visual presentation with interactive activity on the components of exercise prescription. Introduces F.I.T.T principle from ACSM for exercise prescriptions. Reviews F.I.T.T. principles of flexibility and balance exercise training including progression. Also discusses the mind body connection.  Reviews various relaxation techniques to help reduce and manage stress (i.e. Deep breathing, progressive muscle relaxation, and visualization). Balance handout provided to take home. Written material given at graduation. Flowsheet Row Pulmonary Rehab from 03/04/2019 in [facility] Cardiac and Pulmonary Rehab  Date 01/21/19  Educator [facility]  Instruction Review Code 1- Verbalizes Understanding       Activity Barriers & Risk Stratification:  Activity Barriers & Cardiac Risk  Stratification - 12/02/23 1517       Activity Barriers & Cardiac Risk Stratification   Activity Barriers Balance Concerns;Shortness of Breath;Deconditioning;Muscular Weakness    Cardiac Risk Stratification Low             6 Minute Walk:  6 Minute Walk     Row Name 12/02/23 1514         6 Minute Walk   Phase Initial     Distance 1420 feet     Walk Time 6 minutes     # of Rest Breaks 0     MPH 2.7     METS 1.97     RPE 11     Perceived Dyspnea  3     VO2 Peak 6.9     Symptoms No     Resting HR 70 bpm     Resting BP 152/68     Resting Oxygen Saturation  96 %     Exercise Oxygen Saturation  during 6 min walk 93 %     Max Ex. HR 96 bpm     Max Ex. BP 172/68     2 Minute Post BP 146/70       Interval HR   1 Minute HR 87     2 Minute HR 86     3 Minute HR 91     4 Minute HR 95     5 Minute HR 96     6 Minute HR 71     2 Minute Post HR 78     Interval Heart Rate? Yes       Interval Oxygen   Interval Oxygen? Yes     Baseline Oxygen Saturation % 96 %     1 Minute Oxygen Saturation % 95 %     2  Minute Oxygen Saturation % 94 %     3 Minute Oxygen Saturation % 93 %     4 Minute Oxygen Saturation % 94 %     5 Minute Oxygen Saturation % 94 %     6 Minute Oxygen Saturation % 97 %     2 Minute Post Oxygen Saturation % 97 %             Oxygen Initial Assessment:  Oxygen Initial Assessment - 12/02/23 1529       Home Oxygen   Home Oxygen Device None    Sleep Oxygen Prescription None    Home Exercise Oxygen Prescription None    Home Resting Oxygen Prescription None    Compliance with Home Oxygen Use Yes      Initial 6 min Walk   Oxygen Used None      Program Oxygen Prescription   Program Oxygen Prescription None      Intervention   Short Term Goals To learn and understand importance of maintaining oxygen saturations>88%;To learn and demonstrate proper pursed lip breathing techniques or other breathing techniques. ;To learn and understand importance of monitoring SPO2 with pulse oximeter and demonstrate accurate use of the pulse oximeter.    Long  Term Goals Maintenance of O2 saturations>88%;Exhibits proper breathing techniques, such as pursed lip breathing or other method taught during program session;Verbalizes importance of monitoring SPO2 with pulse oximeter and return demonstration             Oxygen Re-Evaluation:   Oxygen Discharge (Final Oxygen Re-Evaluation):   Initial Exercise Prescription:  Initial Exercise Prescription - 12/02/23 1500       Date of Initial  Exercise RX and Referring Provider   Date 12/02/23    Referring Provider Dr. Dale [place]      Oxygen   Maintain Oxygen Saturation 88% or higher      Treadmill   MPH 2.5    Grade 0    Minutes 15    METs 2.91      Recumbant Bike   Level 2    RPM 50    Watts 25    Minutes 15    METs 1.97      NuStep   Level 2    SPM 80    Minutes 15    METs 1.97      REL-XR   Level 2    Watts 25    Speed 50    Minutes 15    METs 1.97      T5 Nustep   Level 2    SPM 80    Minutes 15     METs 1.97      Track   Laps 19    Minutes 15    METs 2.09      Intensity   THRR 40-80% of Max Heartrate 92-114    Ratings of Perceived Exertion 11-13    Perceived Dyspnea 0-4      Progression   Progression Continue to progress workloads to maintain intensity without signs/symptoms of physical distress.      Resistance Training   Training Prescription Yes    Weight 3 lb    Reps 10-15             Perform Capillary Blood Glucose checks as needed.  Exercise Prescription Changes:   Exercise Prescription Changes     Row Name 12/02/23 1500 12/11/23 1100           Response to Exercise   Blood Pressure (Admit) 152/68 126/70      Blood Pressure (Exercise) 172/68 162/68      Blood Pressure (Exit) 146/70 116/62      Heart Rate (Admit) 70 bpm 72 bpm      Heart Rate (Exercise) 96 bpm 93 bpm      Heart Rate (Exit) 78 bpm 84 bpm      Oxygen Saturation (Admit) 96 % 95 %      Oxygen Saturation (Exercise) 93 % 93 %      Oxygen Saturation (Exit) 97 % 95 %      Rating of Perceived Exertion (Exercise) 11 14      Perceived Dyspnea (Exercise) 3 1      Symptoms none none      Comments results First two days of exercise      Duration -- Progress to 30 minutes of  aerobic without signs/symptoms of physical distress      Intensity -- THRR unchanged        Progression   Progression -- Continue to progress workloads to maintain intensity without signs/symptoms of physical distress.      Average METs -- 2.46        Resistance Training   Training Prescription -- Yes      Weight -- 3 lb      Reps -- 10-15        Interval Training   Interval Training No No        Recumbant Bike   Level -- 2      Watts -- 11      Minutes -- 15      METs -- 3.2  NuStep   Level -- 2      Minutes -- 15      METs -- 2        REL-XR   Level -- 1      Minutes -- 15      METs -- 2        Track   Laps -- 30      Minutes -- 15      METs -- 2.63        Oxygen   Maintain Oxygen  Saturation -- 88% or higher               Exercise Comments:   Exercise Comments     Row Name 12/04/23 1404           Exercise Comments First full day of exercise!  Patient was oriented to gym and equipment including functions, settings, policies, and procedures.  Patient's individual exercise prescription and treatment plan were reviewed.  All starting workloads were established based on the results of the 6 minute walk test done at initial orientation visit.  The plan for exercise progression was also introduced and progression will be customized based on patient's performance and goals.                Exercise Goals and Review:   Exercise Goals     Row Name 12/02/23 1522             Exercise Goals   Increase Physical Activity Yes       Intervention Develop an individualized exercise prescription for aerobic and resistive training based on initial evaluation findings, risk stratification, comorbidities and participant's personal goals.;Provide advice, education, support and counseling about physical activity/exercise needs.       Expected Outcomes Long Term: Exercising regularly at least 3-5 days a week.;Long Term: Add in home exercise to make exercise part of routine and to increase amount of physical activity.;Short Term: Attend rehab on a regular basis to increase amount of physical activity.       Increase Strength and Stamina Yes       Intervention Develop an individualized exercise prescription for aerobic and resistive training based on initial evaluation findings, risk stratification, comorbidities and participant's personal goals.;Provide advice, education, support and counseling about physical activity/exercise needs.       Expected Outcomes Long Term: Improve cardiorespiratory fitness, muscular endurance and strength as measured by increased METs and functional capacity ( );Short Term: Perform resistance training exercises routinely during rehab and add in  resistance training at home;Short Term: Increase workloads from initial exercise prescription for resistance, speed, and METs.       Able to understand and use rate of perceived exertion (RPE) scale Yes       Intervention Provide education and explanation on how to use RPE scale       Expected Outcomes Long Term:  Able to use RPE to guide intensity level when exercising independently;Short Term: Able to use RPE daily in rehab to express subjective intensity level       Able to understand and use Dyspnea scale Yes       Intervention Provide education and explanation on how to use Dyspnea scale       Expected Outcomes Long Term: Able to use Dyspnea scale to guide intensity level when exercising independently;Short Term: Able to use Dyspnea scale daily in rehab to express subjective sense of shortness of breath during exertion       Knowledge and understanding  of Target Heart Rate Range (THRR) Yes       Intervention Provide education and explanation of THRR including how the numbers were predicted and where they are located for reference       Expected Outcomes Long Term: Able to use THRR to govern intensity when exercising independently;Short Term: Able to use daily as guideline for intensity in rehab;Short Term: Able to state/look up THRR       Able to check pulse independently Yes       Intervention Review the importance of being able to check your own pulse for safety during independent exercise;Provide education and demonstration on how to check pulse in carotid and radial arteries.       Expected Outcomes Long Term: Able to check pulse independently and accurately;Short Term: Able to explain why pulse checking is important during independent exercise       Understanding of Exercise Prescription Yes       Intervention Provide education, explanation, and written materials on patient's individual exercise prescription       Expected Outcomes Long Term: Able to explain home exercise prescription to  exercise independently;Short Term: Able to explain program exercise prescription                Exercise Goals Re-Evaluation :  Exercise Goals Re-Evaluation     Row Name 12/04/23 1404 12/11/23 1140           Exercise Goal Re-Evaluation   Exercise Goals Review Increase Physical Activity;Able to understand and use rate of perceived exertion (RPE) scale;Knowledge and understanding of Target Heart Rate Range (THRR);Understanding of Exercise Prescription;Increase Strength and Stamina;Able to understand and use Dyspnea scale;Able to check pulse independently Increase Physical Activity;Increase Strength and Stamina;Understanding of Exercise Prescription      Comments Reviewed RPE and dyspnea scale, THR and program prescription with pt today.  Pt voiced understanding and was given a copy of goals to take home. Elizabeth [name] is off to a good start in the program. She did well walking the track and was able to reach 30 laps. She also did well at level 2 on both the T4 nustep and recumbent bike, and level 1 on the XR. She also has done well with 3 lb hand weights for resistance training. We will continue to monitor her progress in the program.      Expected Outcomes Short: Use RPE daily to regulate intensity.  Long: Follow program prescription in THR. Short: Continue to follow current exercise prescription. Long: Continue exercise to improve strength and stamina.               Discharge Exercise Prescription (Final Exercise Prescription Changes):  Exercise Prescription Changes - 12/11/23 1100       Response to Exercise   Blood Pressure (Admit) 126/70    Blood Pressure (Exercise) 162/68    Blood Pressure (Exit) 116/62    Heart Rate (Admit) 72 bpm    Heart Rate (Exercise) 93 bpm    Heart Rate (Exit) 84 bpm    Oxygen Saturation (Admit) 95 %    Oxygen Saturation (Exercise) 93 %    Oxygen Saturation (Exit) 95 %    Rating of Perceived Exertion (Exercise) 14    Perceived Dyspnea (Exercise) 1     Symptoms none    Comments First two days of exercise    Duration Progress to 30 minutes of  aerobic without signs/symptoms of physical distress    Intensity THRR unchanged      Progression  Progression Continue to progress workloads to maintain intensity without signs/symptoms of physical distress.    Average METs 2.46      Resistance Training   Training Prescription Yes    Weight 3 lb    Reps 10-15      Interval Training   Interval Training No      Recumbant Bike   Level 2    Watts 11    Minutes 15    METs 3.2      NuStep   Level 2    Minutes 15    METs 2      REL-XR   Level 1    Minutes 15    METs 2      Track   Laps 30    Minutes 15    METs 2.63      Oxygen   Maintain Oxygen Saturation 88% or higher             Nutrition:  Target Goals: Understanding of nutrition guidelines, daily intake of sodium 1500mg , cholesterol 200mg , calories 30% from fat and 7% or less from saturated fats, daily to have 5 or more servings of fruits and vegetables.  Education: All About Nutrition: -Group instruction provided by verbal, written material, interactive activities, discussions, models, and posters to present general guidelines for heart healthy nutrition including fat, fiber, MyPlate, the role of sodium in heart healthy nutrition, utilization of the nutrition label, and utilization of this knowledge for meal planning. Follow up email sent as well. Written material given at graduation. Flowsheet Row Pulmonary Rehab from 12/04/2023 in [facility] Cardiac and Pulmonary Rehab  Education need identified 12/04/23       Biometrics:  Pre Biometrics - 12/02/23 1523       Pre Biometrics   Height 5' 1.5" (1.562 m)    Weight 144 lb 4.8 oz (65.5 kg)    Waist Circumference 34.5 inches    Hip Circumference 40.5 inches    Waist to Hip Ratio 0.85 %    BMI (Calculated) 26.83    Single Leg Stand 6.7 seconds              Nutrition Therapy Plan and Nutrition  Goals:   Nutrition Assessments:  MEDIFICTS Score Key: >=70 Need to make dietary changes  40-70 Heart Healthy Diet <= 40 Therapeutic Level Cholesterol Diet  Flowsheet Row Pulmonary Rehab from 12/02/2023 in [facility] Cardiac and Pulmonary Rehab  Picture Your Plate Total Score on Admission 63      Picture Your Plate Scores: <57 Unhealthy dietary pattern with much room for improvement. 41-50 Dietary pattern unlikely to meet recommendations for good health and room for improvement. 51-60 More healthful dietary pattern, with some room for improvement.  >60 Healthy dietary pattern, although there may be some specific behaviors that could be improved.   Nutrition Goals Re-Evaluation:   Nutrition Goals Discharge (Final Nutrition Goals Re-Evaluation):   Psychosocial: Target Goals: Acknowledge presence or absence of significant depression and/or stress, maximize coping skills, provide positive support system. Participant is able to verbalize types and ability to use techniques and skills needed for reducing stress and depression.   Education: Stress, Anxiety, and Depression - Group verbal and visual presentation to define topics covered.  Reviews how body is impacted by stress, anxiety, and depression.  Also discusses healthy ways to reduce stress and to treat/manage anxiety and depression.  Written material given at graduation. Flowsheet Row Pulmonary Rehab from 03/04/2019 in [facility] Cardiac and Pulmonary Rehab  Date 02/25/19  Educator K[facility]  Instruction Review Code 1- Verbalizes Understanding       Education: Sleep Hygiene -Provides group verbal and written instruction about how sleep can affect your health.  Define sleep hygiene, discuss sleep cycles and impact of sleep habits. Review good sleep hygiene tips.    Initial Review & Psychosocial Screening:  Initial Psych Review & Screening - 11/28/23 1348       Initial Review   Current issues with History of Depression      Family Dynamics    Good Support System? Yes      Barriers   Psychosocial barriers to participate in program The patient should benefit from training in stress management and relaxation.      Screening Interventions   Interventions Encouraged to exercise;To provide support and resources with identified psychosocial needs;Provide feedback about the scores to participant    Expected Outcomes Short Term goal: Utilizing psychosocial counselor, staff and physician to assist with identification of specific Stressors or current issues interfering with healing process. Setting desired goal for each stressor or current issue identified.;Long Term Goal: Stressors or current issues are controlled or eliminated.;Short Term goal: Identification and review with participant of any Quality of Life or Depression concerns found by scoring the questionnaire.;Long Term goal: The participant improves quality of Life and PHQ9 Scores as seen by post scores and/or verbalization of changes             Quality of Life Scores:  Scores of 19 and below usually indicate a poorer quality of life in these areas.  A difference of  2-3 points is a clinically meaningful difference.  A difference of 2-3 points in the total score of the Quality of Life Index has been associated with significant improvement in overall quality of life, self-image, physical symptoms, and general health in studies assessing change in quality of life.  PHQ-9: Review Flowsheet  More data exists      12/02/2023 06/24/2023 12/06/2022 10/02/2022 09/03/2022  Depression screen PHQ 2/9  Decreased Interest 1 0 0 0 0  Down, Depressed, Hopeless 0 0 0 0 0  PHQ - 2 Score 1 0 0 0 0  Altered sleeping 0 2 1 - -  Tired, decreased energy 1 0 1 - -  Change in appetite 0 0 0 - -  Feeling bad or failure about yourself  0 0 0 - -  Trouble concentrating 0 0 0 - -  Moving slowly or fidgety/restless 0 0 0 - -  Suicidal thoughts 0 0 0 - -  PHQ-9 Score 2 2 2  - -  Difficult doing  work/chores Not difficult at all Not difficult at all Not difficult at all - -   Interpretation of Total Score  Total Score Depression Severity:  1-4 = Minimal depression, 5-9 = Mild depression, 10-14 = Moderate depression, 15-19 = Moderately severe depression, 20-27 = Severe depression   Psychosocial Evaluation and Intervention:  Psychosocial Evaluation - 11/28/23 1349       Psychosocial Evaluation & Interventions   Interventions Encouraged to exercise with the program and follow exercise prescription    Comments Elizabeth [name] is coming to pulmonary rehab with heart failure. She did the program in early 2020, but stopped due to the beginning of Covid. She states she stays very active for a woman in her 90s. She enjoys painting and walking. She recently moved to the independent living at [facility]. She says she just finished PT there for her ongoing vertigo problems and really enjoyed  the staff there. She wants to get more active since she stayed indoors a lot during covid and is looking forward to attending the program. She does not report any stress concerns today, says she takes each day as it comes. She is motivated to attend the program so she can be udner supervision as she starts increasing her activity level.    Expected Outcomes Short: attend cardiac rehab for educaiton and exercise. Long; Develop and maintain positive self care habits    Continue Psychosocial Services  Follow up required by staff             Psychosocial Re-Evaluation:   Psychosocial Discharge (Final Psychosocial Re-Evaluation):   Education: Education Goals: Education classes will be provided on a weekly basis, covering required topics. Participant will state understanding/return demonstration of topics presented.  Learning Barriers/Preferences:  Learning Barriers/Preferences - 11/28/23 1333       Learning Barriers/Preferences   Learning Barriers Hearing    Learning Preferences Individual Instruction              General Pulmonary Education Topics:  Infection Prevention: - Provides verbal and written material to individual with discussion of infection control including proper hand washing and proper equipment cleaning during exercise session. Flowsheet Row Pulmonary Rehab from 12/04/2023 in [facility] Cardiac and Pulmonary Rehab  Date 12/04/23  Educator [facility]  Instruction Review Code 1- Verbalizes Understanding       Falls Prevention: - Provides verbal and written material to individual with discussion of falls prevention and safety. Flowsheet Row Pulmonary Rehab from 12/04/2023 in [facility] Cardiac and Pulmonary Rehab  Date 12/04/23  Educator [facility]  Instruction Review Code 1- Verbalizes Understanding       Chronic Lung Disease Review: - Group verbal instruction with posters, models, PowerPoint presentations and videos,  to review new updates, new respiratory medications, new advancements in procedures and treatments. Providing information on websites and "800" numbers for continued self-education. Includes information about supplement oxygen, available portable oxygen systems, continuous and intermittent flow rates, oxygen safety, concentrators, and Medicare reimbursement for oxygen. Explanation of Pulmonary Drugs, including class, frequency, complications, importance of spacers, rinsing mouth after steroid MDI's, and proper cleaning methods for nebulizers. Review of basic lung anatomy and physiology related to function, structure, and complications of lung disease. Review of risk factors. Discussion about methods for diagnosing sleep apnea and types of masks and machines for OSA. Includes a review of the use of types of environmental controls: home humidity, furnaces, filters, dust mite/pet prevention, HEPA vacuums. Discussion about weather changes, air quality and the benefits of nasal washing. Instruction on Warning signs, infection symptoms, calling MD promptly, preventive modes, and value of  vaccinations. Review of effective airway clearance, coughing and/or vibration techniques. Emphasizing that all should Create an Action Plan. Written material given at graduation. Flowsheet Row Pulmonary Rehab from 12/04/2023 in [facility] Cardiac and Pulmonary Rehab  Date 12/04/23  Educator [facility]  Instruction Review Code 1- Verbalizes Understanding       AED/CPR: - Group verbal and written instruction with the use of models to demonstrate the basic use of the AED with the basic ABC's of resuscitation.    Anatomy and Cardiac Procedures: - Group verbal and visual presentation and models provide information about basic cardiac anatomy and function. Reviews the testing methods done to diagnose heart disease and the outcomes of the test results. Describes the treatment choices: Medical Management, Angioplasty, or Coronary Bypass Surgery for treating various heart conditions including Myocardial Infarction, Angina, Valve Disease, and Cardiac  Arrhythmias.  Written material given at graduation.   Medication Safety: - Group verbal and visual instruction to review commonly prescribed medications for heart and lung disease. Reviews the medication, class of the drug, and side effects. Includes the steps to properly store meds and maintain the prescription regimen.  Written material given at graduation.   Other: -Provides group and verbal instruction on various topics (see comments)   Knowledge Questionnaire Score:  Knowledge Questionnaire Score - 12/04/23 1400       Knowledge Questionnaire Score   Pre Score 22              Core Components/Risk Factors/Patient Goals at Admission:  Personal Goals and Risk Factors at Admission - 11/28/23 1332       Core Components/Risk Factors/Patient Goals on Admission   Improve shortness of breath with ADL's Yes    Intervention Provide education, individualized exercise plan and daily activity instruction to help decrease symptoms of SOB with activities of  daily living.    Expected Outcomes Short Term: Improve cardiorespiratory fitness to achieve a reduction of symptoms when performing ADLs;Long Term: Be able to perform more ADLs without symptoms or delay the onset of symptoms    Diabetes Yes   Diet controlled   Intervention Provide education about signs/symptoms and action to take for hypo/hyperglycemia.;Provide education about proper nutrition, including hydration, and aerobic/resistive exercise prescription along with prescribed medications to achieve blood glucose in normal ranges: Fasting glucose 65-99 mg/dL    Expected Outcomes Short Term: Participant verbalizes understanding of the signs/symptoms and immediate care of hyper/hypoglycemia, proper foot care and importance of medication, aerobic/resistive exercise and nutrition plan for blood glucose control.;Long Term: Attainment of HbA1C < 7%.    Hypertension Yes    Intervention Provide education on lifestyle modifcations including regular physical activity/exercise, weight management, moderate sodium restriction and increased consumption of fresh fruit, vegetables, and low fat dairy, alcohol moderation, and smoking cessation.;Monitor prescription use compliance.    Expected Outcomes Short Term: Continued assessment and intervention until BP is < 140/42mm HG in hypertensive participants. < 130/35mm HG in hypertensive participants with diabetes, heart failure or chronic kidney disease.;Long Term: Maintenance of blood pressure at goal levels.    Lipids Yes    Intervention Provide education and support for participant on nutrition & aerobic/resistive exercise along with prescribed medications to achieve LDL 70mg , HDL >40mg .    Expected Outcomes Short Term: Participant states understanding of desired cholesterol values and is compliant with medications prescribed. Participant is following exercise prescription and nutrition guidelines.;Long Term: Cholesterol controlled with medications as prescribed, with  individualized exercise RX and with personalized nutrition plan. Value goals: LDL < 70mg , HDL > 40 mg.             Education:Diabetes - Individual verbal and written instruction to review signs/symptoms of diabetes, desired ranges of glucose level fasting, after meals and with exercise. Acknowledge that pre and post exercise glucose checks will be done for 3 sessions at entry of program.   Know Your Numbers and Heart Failure: - Group verbal and visual instruction to discuss disease risk factors for cardiac and pulmonary disease and treatment options.  Reviews associated critical values for Overweight/Obesity, Hypertension, Cholesterol, and Diabetes.  Discusses basics of heart failure: signs/symptoms and treatments.  Introduces Heart Failure Zone chart for action plan for heart failure.  Written material given at graduation.   Core Components/Risk Factors/Patient Goals Review:    Core Components/Risk Factors/Patient Goals at Discharge (Final Review):    ITP Comments:  ITP Comments     Row Name 11/28/23 1339 12/02/23 1513 12/04/23 1404 12/11/23 1206     ITP Comments Initial phone call completed. Diagnosis can be found in [facility] 11/1. EP Orientation scheduled for Monday 12/9 at 1:30. Completed and gym orientation. Initial ITP created and sent for review to Dr. Jinny Sanders, Medical Director. First full day of exercise!  Patient was oriented to gym and equipment including functions, settings, policies, and procedures.  Patient's individual exercise prescription and treatment plan were reviewed.  All starting workloads were established based on the results of the 6 minute walk test done at initial orientation visit.  The plan for exercise progression was also introduced and progression will be customized based on patient's performance and goals. 30 Day review completed. Medical Director ITP review done, changes made as directed, and signed approval by Medical Director.    new to program              Comments:

## 2023-12-11 NOTE — Progress Notes (Signed)
Daily Session Note  Patient Details  Name: Elizabeth [name] MRN: [id number] Date of Birth: Dec 04, 1927 Referring Provider:   Flowsheet Row Pulmonary Rehab from 12/02/2023 in [facility] Cardiac and Pulmonary Rehab  Referring Provider Dr. Dale [place]       Encounter Date: 12/11/2023  Check In:  Session Check In - 12/11/23 1352       Check-In   Supervising physician immediately available to respond to emergencies See telemetry face sheet for immediately available ER MD    Location [name]    Staff Present Susann Givens, RN Mabeline Caras, BS, ACSM CEP, Exercise Physiologist;Jason Wallace Cullens, RDN, Luiz Iron, BS, Exercise Physiologist    Virtual Visit No    Medication changes reported     No    Fall or balance concerns reported    No    Warm-up and Cool-down Performed on first and last piece of equipment    Resistance Training Performed Yes    VAD Patient? No    PAD/SET Patient? No      Pain Assessment   Currently in Pain? No/denies                Social History   Tobacco Use  Smoking Status Never  Smokeless Tobacco Never    Goals Met:  Independence with exercise equipment Exercise tolerated well No report of concerns or symptoms today Strength training completed today  Goals Unmet:  Not Applicable  Comments: Pt able to follow exercise prescription today without complaint.  Will continue to monitor for progression.    Dr. Bethann Punches is Medical Director for [facility] Cardiac Rehabilitation.  Dr. Vida Rigger is Medical Director for [facility] Pulmonary Rehabilitation.

## 2023-12-12 ENCOUNTER — Encounter: Payer: Medicare Other | Admitting: *Deleted

## 2023-12-12 DIAGNOSIS — I5032 Chronic diastolic (congestive) heart failure: Secondary | ICD-10-CM | POA: Diagnosis not present

## 2023-12-12 NOTE — Progress Notes (Signed)
Daily Session Note  Patient Details  Name: Elizabeth [name] MRN: [id number] Date of Birth: [birth year] Referring Provider:   Flowsheet Row Pulmonary Rehab from 12/02/2023 in [facility] Cardiac and Pulmonary Rehab  Referring Provider Dr. Dale [place]       Encounter Date: 12/12/2023  Check In:  Session Check In - 12/12/23 1438       Check-In   Supervising physician immediately available to respond to emergencies See telemetry face sheet for immediately available ER MD    Location [name]    Staff Present Elige Ko, RCP,RRT,BSRT;Maxon Conetta BS, , Exercise Physiologist;Geneva Barrero Katrinka Blazing, RN, ADN;Susanne Bice, RN, BSN, CCRP    Virtual Visit No    Medication changes reported     No    Fall or balance concerns reported    No    Warm-up and Cool-down Performed on first and last piece of equipment    Resistance Training Performed Yes    VAD Patient? No    PAD/SET Patient? No      Pain Assessment   Currently in Pain? No/denies                Social History   Tobacco Use  Smoking Status Never  Smokeless Tobacco Never    Goals Met:  Independence with exercise equipment Exercise tolerated well No report of concerns or symptoms today Strength training completed today  Goals Unmet:  Not Applicable  Comments: Pt able to follow exercise prescription today without complaint.  Will continue to monitor for progression.    Dr. Bethann Punches is Medical Director for [facility] Cardiac Rehabilitation.  Dr. Vida Rigger is Medical Director for [facility] Pulmonary Rehabilitation.

## 2023-12-16 ENCOUNTER — Encounter: Payer: Medicare Other | Admitting: *Deleted

## 2023-12-16 DIAGNOSIS — I5032 Chronic diastolic (congestive) heart failure: Secondary | ICD-10-CM

## 2023-12-16 NOTE — Progress Notes (Signed)
Daily Session Note  Patient Details  Name: Elizabeth [name] MRN: [id number] Date of Birth: 1927-03-19 Referring Provider:   Flowsheet Row Pulmonary Rehab from 12/02/2023 in [facility] Cardiac and Pulmonary Rehab  Referring Provider Dr. Dale [place]       Encounter Date: 12/16/2023  Check In:  Session Check In - 12/16/23 1402       Check-In   Supervising physician immediately available to respond to emergencies See telemetry face sheet for immediately available ER MD    Location [name]    Staff Present Susann Givens, RN BSN;Joseph Reino Kent, RCP,RRT,BSRT;Kristen Coble, RN,BC,MSN;Noah [place], [place], Exercise Physiologist    Virtual Visit No    Medication changes reported     No    Fall or balance concerns reported    No    Warm-up and Cool-down Performed on first and last piece of equipment    Resistance Training Performed Yes    VAD Patient? No    PAD/SET Patient? No      Pain Assessment   Currently in Pain? No/denies                Social History   Tobacco Use  Smoking Status Never  Smokeless Tobacco Never    Goals Met:  Independence with exercise equipment Exercise tolerated well No report of concerns or symptoms today Strength training completed today  Goals Unmet:  Not Applicable  Comments: Pt able to follow exercise prescription today without complaint.  Will continue to monitor for progression.    Dr. Bethann Punches is Medical Director for [facility] Cardiac Rehabilitation.  Dr. Vida Rigger is Medical Director for [facility] Pulmonary Rehabilitation.

## 2023-12-23 ENCOUNTER — Encounter: Payer: Medicare Other | Admitting: *Deleted

## 2023-12-23 DIAGNOSIS — I5032 Chronic diastolic (congestive) heart failure: Secondary | ICD-10-CM | POA: Diagnosis not present

## 2023-12-23 NOTE — Progress Notes (Signed)
Daily Session Note  Patient Details  Name: Elizabeth [name] MRN: [id number] Date of Birth: [birth year] Referring Provider:   Flowsheet Row Pulmonary Rehab from 12/02/2023 in [facility] Cardiac and Pulmonary Rehab  Referring Provider Dr. Dale [place]       Encounter Date: 12/23/2023  Check In:  Session Check In - 12/23/23 1359       Check-In   Supervising physician immediately available to respond to emergencies See telemetry face sheet for immediately available ER MD    Location [name]    Staff Present Susann Givens, RN BSN;Joseph Reino Kent, RCP,RRT,BSRT;Maxon Hassell, , Exercise Physiologist;Laureen Manson Passey, BS, RRT, CPFT    Virtual Visit No    Medication changes reported     No    Fall or balance concerns reported    No    Warm-up and Cool-down Performed on first and last piece of equipment    Resistance Training Performed Yes    VAD Patient? No    PAD/SET Patient? No      Pain Assessment   Currently in Pain? No/denies                Social History   Tobacco Use  Smoking Status Never  Smokeless Tobacco Never    Goals Met:  Independence with exercise equipment Exercise tolerated well No report of concerns or symptoms today Strength training completed today  Goals Unmet:  Not Applicable  Comments: Pt able to follow exercise prescription today without complaint.  Will continue to monitor for progression.    Dr. Bethann Punches is Medical Director for [facility] Cardiac Rehabilitation.  Dr. Vida Rigger is Medical Director for [facility] Pulmonary Rehabilitation.

## 2023-12-26 ENCOUNTER — Encounter: Payer: Medicare Other | Attending: Internal Medicine | Admitting: *Deleted

## 2023-12-26 DIAGNOSIS — I5032 Chronic diastolic (congestive) heart failure: Secondary | ICD-10-CM | POA: Insufficient documentation

## 2023-12-26 NOTE — Progress Notes (Signed)
 Daily Session Note  Patient Details  Name: Elizabeth [name] MRN: [id number] Date of Birth: 12-08-1927 Referring Provider:   Flowsheet Row Pulmonary Rehab from 12/02/2023 in [facility] Cardiac and Pulmonary Rehab  Referring Provider Dr. Allena Hamilton       Encounter Date: 12/26/2023  Check In:  Session Check In - 12/26/23 1442       Check-In   Supervising physician immediately available to respond to emergencies See telemetry face sheet for immediately available ER MD    Location [name]    Staff Present Devaughn Jaeger, BS, Exercise Physiologist;Meredith Tressa, RN BSN;Maxon Conetta BS, , Exercise Physiologist;Caress Reffitt Claudene, RN, ADN    Virtual Visit No    Medication changes reported     No    Fall or balance concerns reported    No    Warm-up and Cool-down Performed on first and last piece of equipment    Resistance Training Performed Yes    VAD Patient? No    PAD/SET Patient? No      Pain Assessment   Currently in Pain? No/denies                Social History   Tobacco Use  Smoking Status Never  Smokeless Tobacco Never    Goals Met:  Independence with exercise equipment Exercise tolerated well No report of concerns or symptoms today Strength training completed today  Goals Unmet:  Not Applicable  Comments: Pt able to follow exercise prescription today without complaint.  Will continue to monitor for progression.    Dr. Oneil Pinal is Medical Director for [facility] Cardiac Rehabilitation.  Dr. Fuad Aleskerov is Medical Director for [facility] Pulmonary Rehabilitation.

## 2023-12-30 ENCOUNTER — Encounter: Payer: Medicare Other | Admitting: *Deleted

## 2023-12-30 DIAGNOSIS — I5032 Chronic diastolic (congestive) heart failure: Secondary | ICD-10-CM

## 2023-12-30 NOTE — Progress Notes (Signed)
 Daily Session Note  Patient Details  Name: Elizabeth [name] MRN: [id number] Date of Birth: 1927-10-09 Referring Provider:   Flowsheet Row Pulmonary Rehab from 12/02/2023 in [facility] Cardiac and Pulmonary Rehab  Referring Provider Dr. Allena Hamilton       Encounter Date: 12/30/2023  Check In:  Session Check In - 12/30/23 1338       Check-In   Supervising physician immediately available to respond to emergencies See telemetry face sheet for immediately available ER MD    Location [name]    Staff Present Hoy Rodney, RN BSN;Joseph Hood, RCP,RRT,BSRT;Maxon Irwin BS, , Exercise Physiologist;Noah Tickle, BS, Exercise Physiologist    Virtual Visit No    Medication changes reported     No    Fall or balance concerns reported    No    Warm-up and Cool-down Performed on first and last piece of equipment    Resistance Training Performed Yes    VAD Patient? No    PAD/SET Patient? No      Pain Assessment   Currently in Pain? No/denies                Social History   Tobacco Use  Smoking Status Never  Smokeless Tobacco Never    Goals Met:  Independence with exercise equipment Exercise tolerated well No report of concerns or symptoms today Strength training completed today  Goals Unmet:  Not Applicable  Comments: Pt able to follow exercise prescription today without complaint.  Will continue to monitor for progression.    Dr. Oneil Pinal is Medical Director for [facility] Cardiac Rehabilitation.  Dr. Fuad Aleskerov is Medical Director for [facility] Pulmonary Rehabilitation.

## 2024-01-01 ENCOUNTER — Encounter: Payer: Medicare Other | Admitting: *Deleted

## 2024-01-01 DIAGNOSIS — I5032 Chronic diastolic (congestive) heart failure: Secondary | ICD-10-CM

## 2024-01-01 NOTE — Progress Notes (Signed)
 Daily Session Note  Patient Details  Name: Elizabeth [name] MRN: [id number] Date of Birth: 1927-08-14 Referring Provider:   Flowsheet Row Pulmonary Rehab from 12/02/2023 in [facility] Cardiac and Pulmonary Rehab  Referring Provider Dr. Allena Hamilton       Encounter Date: 01/01/2024  Check In:  Session Check In - 01/01/24 1431       Check-In   Supervising physician immediately available to respond to emergencies See telemetry face sheet for immediately available ER MD    Location [name]    Staff Present Hoy Rodney, RN Monico Hint, BS, ACSM CEP, Exercise Physiologist;Megan Claudene, RN, ADN;Susanne Bice, RN, BSN, CCRP    Virtual Visit No    Medication changes reported     No    Fall or balance concerns reported    No    Warm-up and Cool-down Performed on first and last piece of equipment    Resistance Training Performed Yes    VAD Patient? No    PAD/SET Patient? No      Pain Assessment   Currently in Pain? No/denies                Social History   Tobacco Use  Smoking Status Never  Smokeless Tobacco Never    Goals Met:  Independence with exercise equipment Exercise tolerated well No report of concerns or symptoms today Strength training completed today  Goals Unmet:  Not Applicable  Comments: Pt able to follow exercise prescription today without complaint.  Will continue to monitor for progression.    Dr. Oneil Pinal is Medical Director for [facility] Cardiac Rehabilitation.  Dr. Fuad Aleskerov is Medical Director for [facility] Pulmonary Rehabilitation.

## 2024-01-02 ENCOUNTER — Encounter: Payer: Medicare Other | Admitting: *Deleted

## 2024-01-02 DIAGNOSIS — I5032 Chronic diastolic (congestive) heart failure: Secondary | ICD-10-CM

## 2024-01-02 NOTE — Progress Notes (Signed)
 Daily Session Note  Patient Details  Name: Elizabeth [name] MRN: [id number] Date of Birth: January 25, 1927 Referring Provider:   Flowsheet Row Pulmonary Rehab from 12/02/2023 in [facility] Cardiac and Pulmonary Rehab  Referring Provider Dr. Allena Hamilton       Encounter Date: 01/02/2024  Check In:  Session Check In - 01/02/24 1423       Check-In   Supervising physician immediately available to respond to emergencies See telemetry face sheet for immediately available ER MD    Location [name]    Staff Present Fairy Plater, RCP,RRT,BSRT;Meredith Tressa, RN BSN;Noah Tickle, BS, Exercise Physiologist;Orvil Faraone Claudene, RN, ADN    Virtual Visit No    Medication changes reported     No    Fall or balance concerns reported    No    Warm-up and Cool-down Performed on first and last piece of equipment    Resistance Training Performed Yes    VAD Patient? No    PAD/SET Patient? No      Pain Assessment   Currently in Pain? No/denies                Social History   Tobacco Use  Smoking Status Never  Smokeless Tobacco Never    Goals Met:  Independence with exercise equipment Exercise tolerated well No report of concerns or symptoms today Strength training completed today  Goals Unmet:  Not Applicable  Comments: Pt able to follow exercise prescription today without complaint.  Will continue to monitor for progression.    Dr. Oneil Pinal is Medical Director for [facility] Cardiac Rehabilitation.  Dr. Fuad Aleskerov is Medical Director for [facility] Pulmonary Rehabilitation.

## 2024-01-06 ENCOUNTER — Encounter: Payer: Medicare Other | Admitting: *Deleted

## 2024-01-06 ENCOUNTER — Telehealth: Payer: Self-pay | Admitting: Internal Medicine

## 2024-01-06 DIAGNOSIS — I5032 Chronic diastolic (congestive) heart failure: Secondary | ICD-10-CM

## 2024-01-06 NOTE — Progress Notes (Signed)
 Daily Session Note  Patient Details  Name: DELPHIA KAYLOR MRN: [id number] Date of Birth: 1927/02/15 Referring Provider:   Flowsheet Row Pulmonary Rehab from 12/02/2023 in [facility] Cardiac and Pulmonary Rehab  Referring Provider Dr. Allena Hamilton       Encounter Date: 01/06/2024  Check In:  Session Check In - 01/06/24 1401       Check-In   Supervising physician immediately available to respond to emergencies See telemetry face sheet for immediately available ER MD    Location [name]    Staff Present Hoy Rodney, RN BSN;Maxon Conetta BS, , Exercise Physiologist;Laureen Delores, BS, RRT, CPFT;Joseph [place], [place]    Virtual Visit No    Medication changes reported     No    Fall or balance concerns reported    No    Warm-up and Cool-down Performed on first and last piece of equipment    Resistance Training Performed Yes    VAD Patient? No    PAD/SET Patient? No      Pain Assessment   Currently in Pain? No/denies                Social History   Tobacco Use  Smoking Status Never  Smokeless Tobacco Never    Goals Met:  Independence with exercise equipment Exercise tolerated well No report of concerns or symptoms today Strength training completed today  Goals Unmet:  Not Applicable  Comments: Pt able to follow exercise prescription today without complaint.  Will continue to monitor for progression.    Dr. Oneil Pinal is Medical Director for [facility] Cardiac Rehabilitation.  Dr. Fuad Aleskerov is Medical Director for [facility] Pulmonary Rehabilitation.

## 2024-01-06 NOTE — Telephone Encounter (Signed)
 Patient dropped off a notice of denial medical coverage for PT. She states she has already had PT done in September of 2024. She just wanted to show doctor letter. Letter was placed in color folder.

## 2024-01-07 NOTE — Telephone Encounter (Signed)
 Noted,

## 2024-01-08 DIAGNOSIS — I5032 Chronic diastolic (congestive) heart failure: Secondary | ICD-10-CM

## 2024-01-08 NOTE — Progress Notes (Signed)
 Pulmonary Individual Treatment Plan  Patient Details  Name: Elizabeth [name] MRN: [id number] Date of Birth: Sep 21, 1927 Referring Provider:   Flowsheet Row Pulmonary Rehab from 12/02/2023 in [facility] Cardiac and Pulmonary Rehab  Referring Provider Dr. Dellar Fenton       Initial Encounter Date:  Flowsheet Row Pulmonary Rehab from 12/02/2023 in [facility] Cardiac and Pulmonary Rehab  Date 12/02/23       Visit Diagnosis: Heart failure, diastolic, chronic (HCC)  Patient's Home Medications on Admission:  Current Outpatient Medications:    amLODipine  (NORVASC ) 2.5 MG tablet, Take 1 tablet (2.5 mg total) by mouth daily., Disp: 90 tablet, Rfl: 3   aspirin  81 MG tablet, Take 81 mg by mouth daily., Disp: , Rfl:    atorvastatin  (LIPITOR ) 40 MG tablet, Take 1 tablet (40 mg total) by mouth daily., Disp: 90 tablet, Rfl: 3   cetirizine (ZYRTEC) 5 MG tablet, Take 5 mg by mouth daily as needed. , Disp: , Rfl:    lisinopril  (ZESTRIL ) 40 MG tablet, Take 1 tablet (40 mg total) by mouth daily., Disp: 90 tablet, Rfl: 3   meclizine  (ANTIVERT ) 12.5 MG tablet, Take 2 tablets (25 mg total) by mouth 3 (three) times daily as needed for dizziness., Disp: 30 tablet, Rfl: 0   metoprolol  tartrate (LOPRESSOR ) 25 MG tablet, Take 1 tablet (25 mg total) by mouth 2 (two) times daily., Disp: 180 tablet, Rfl: 3   OXYQUINOLONE SULFATE VAGINAL (TRIMO-SAN) 0.025 % GEL, Place 1 Applicatorful vaginally once a week., Disp: 113.4 g, Rfl: 3   triamcinolone (NASACORT) 55 MCG/ACT nasal inhaler, Place 2 sprays into the nose daily as needed. , Disp: , Rfl:   Past Medical History: Past Medical History:  Diagnosis Date   Cystocele or rectocele with incomplete uterine prolapse    Hyperlipidemia    Hypertension    Irritable bowel syndrome    Palpitations    Vaginal atrophy     Tobacco Use: Social History   Tobacco Use  Smoking Status Never  Smokeless Tobacco Never    Labs: Review Flowsheet  More data exists      Latest Ref Rng &  Units 08/28/2022 12/03/2022 03/07/2023 06/20/2023 10/22/2023  Labs for ITP Cardiac and Pulmonary Rehab  Cholestrol 0 - 200 mg/dL 409  811  914  782  956   LDL (calc) 0 - 99 mg/dL 95  98  - 79  98   Direct LDL mg/dL - - 213.0  - -  HDL-C >86.57 mg/dL 84.69  62.95  28.41  32.44  48.40   Trlycerides 0.0 - 149.0 mg/dL 010.2  725.3  664.4  034.7  164.0   Hemoglobin A1c 4.6 - 6.5 % 7.0  7.1  6.6  6.8  7.0      Pulmonary Assessment Scores:  Pulmonary Assessment Scores     Row Name 12/02/23 1526 12/04/23 1355       ADL UCSD   ADL Phase -- Entry    SOB Score total -- 2    Rest -- 1    Walk -- 0    Stairs -- 1    Bath -- 0    Dress -- 0    Shop -- 0      CAT Score   CAT Score 14 --      mMRC Score   mMRC Score -- 1             UCSD: Self-administered rating of dyspnea associated with activities of daily living (ADLs) 6-point scale (  0 = "not at all" to 5 = "maximal or unable to do because of breathlessness")  Scoring Scores range from 0 to 120.  Minimally important difference is 5 units  CAT: CAT can identify the health impairment of COPD patients and is better correlated with disease progression.  CAT has a scoring range of zero to 40. The CAT score is classified into four groups of low (less than 10), medium (10 - 20), high (21-30) and very high (31-40) based on the impact level of disease on health status. A CAT score over 10 suggests significant symptoms.  A worsening CAT score could be explained by an exacerbation, poor medication adherence, poor inhaler technique, or progression of COPD or comorbid conditions.  CAT MCID is 2 points  mMRC: mMRC (Modified Medical Research Council) Dyspnea Scale is used to assess the degree of baseline functional disability in patients of respiratory disease due to dyspnea. No minimal important difference is established. A decrease in score of 1 point or greater is considered a positive change.   Pulmonary Function Assessment:   Exercise  Target Goals: Exercise Program Goal: Individual exercise prescription set using results from initial 6 min walk test and THRR while considering  patient's activity barriers and safety.   Exercise Prescription Goal: Initial exercise prescription builds to 30-45 minutes a day of aerobic activity, 2-3 days per week.  Home exercise guidelines will be given to patient during program as part of exercise prescription that the participant will acknowledge.  Education: Aerobic Exercise: - Group verbal and visual presentation on the components of exercise prescription. Introduces F.I.T.T principle from ACSM for exercise prescriptions.  Reviews F.I.T.T. principles of aerobic exercise including progression. Written material given at graduation. Flowsheet Row Pulmonary Rehab from 03/04/2019 in [facility] Cardiac and Pulmonary Rehab  Date 01/16/19  Educator [facility]  Instruction Review Code 1- Verbalizes Understanding       Education: Resistance Exercise: - Group verbal and visual presentation on the components of exercise prescription. Introduces F.I.T.T principle from ACSM for exercise prescriptions  Reviews F.I.T.T. principles of resistance exercise including progression. Written material given at graduation.    Education: Exercise & Equipment Safety: - Individual verbal instruction and demonstration of equipment use and safety with use of the equipment. Flowsheet Row Pulmonary Rehab from 12/04/2023 in [facility] Cardiac and Pulmonary Rehab  Date 12/04/23  Educator [facility]  Instruction Review Code 1- Verbalizes Understanding       Education: Exercise Physiology & General Exercise Guidelines: - Group verbal and written instruction with models to review the exercise physiology of the cardiovascular system and associated critical values. Provides general exercise guidelines with specific guidelines to those with heart or lung disease.  Flowsheet Row Pulmonary Rehab from 12/04/2023 in [facility] Cardiac and Pulmonary Rehab   Education need identified 12/04/23       Education: Flexibility, Balance, Mind/Body Relaxation: - Group verbal and visual presentation with interactive activity on the components of exercise prescription. Introduces F.I.T.T principle from ACSM for exercise prescriptions. Reviews F.I.T.T. principles of flexibility and balance exercise training including progression. Also discusses the mind body connection.  Reviews various relaxation techniques to help reduce and manage stress (i.e. Deep breathing, progressive muscle relaxation, and visualization). Balance handout provided to take home. Written material given at graduation. Flowsheet Row Pulmonary Rehab from 03/04/2019 in [facility] Cardiac and Pulmonary Rehab  Date 01/21/19  Educator [facility]  Instruction Review Code 1- Verbalizes Understanding       Activity Barriers & Risk Stratification:  Activity Barriers & Cardiac Risk  Stratification - 12/02/23 1517       Activity Barriers & Cardiac Risk Stratification   Activity Barriers Balance Concerns;Shortness of Breath;Deconditioning;Muscular Weakness    Cardiac Risk Stratification Low             6 Minute Walk:  6 Minute Walk     Row Name 12/02/23 1514         6 Minute Walk   Phase Initial     Distance 1420 feet     Walk Time 6 minutes     # of Rest Breaks 0     MPH 2.7     METS 1.97     RPE 11     Perceived Dyspnea  3     VO2 Peak 6.9     Symptoms No     Resting HR 70 bpm     Resting BP 152/68     Resting Oxygen Saturation  96 %     Exercise Oxygen Saturation  during 6 min walk 93 %     Max Ex. HR 96 bpm     Max Ex. BP 172/68     2 Minute Post BP 146/70       Interval HR   1 Minute HR 87     2 Minute HR 86     3 Minute HR 91     4 Minute HR 95     5 Minute HR 96     6 Minute HR 71     2 Minute Post HR 78     Interval Heart Rate? Yes       Interval Oxygen   Interval Oxygen? Yes     Baseline Oxygen Saturation % 96 %     1 Minute Oxygen Saturation % 95 %     2  Minute Oxygen Saturation % 94 %     3 Minute Oxygen Saturation % 93 %     4 Minute Oxygen Saturation % 94 %     5 Minute Oxygen Saturation % 94 %     6 Minute Oxygen Saturation % 97 %     2 Minute Post Oxygen Saturation % 97 %             Oxygen Initial Assessment:  Oxygen Initial Assessment - 12/02/23 1529       Home Oxygen   Home Oxygen Device None    Sleep Oxygen Prescription None    Home Exercise Oxygen Prescription None    Home Resting Oxygen Prescription None    Compliance with Home Oxygen Use Yes      Initial 6 min Walk   Oxygen Used None      Program Oxygen Prescription   Program Oxygen Prescription None      Intervention   Short Term Goals To learn and understand importance of maintaining oxygen saturations>88%;To learn and demonstrate proper pursed lip breathing techniques or other breathing techniques. ;To learn and understand importance of monitoring SPO2 with pulse oximeter and demonstrate accurate use of the pulse oximeter.    Long  Term Goals Maintenance of O2 saturations>88%;Exhibits proper breathing techniques, such as pursed lip breathing or other method taught during program session;Verbalizes importance of monitoring SPO2 with pulse oximeter and return demonstration             Oxygen Re-Evaluation:   Oxygen Discharge (Final Oxygen Re-Evaluation):   Initial Exercise Prescription:  Initial Exercise Prescription - 12/02/23 1500       Date of Initial  Exercise RX and Referring Provider   Date 12/02/23    Referring Provider Dr. Dellar Fenton      Oxygen   Maintain Oxygen Saturation 88% or higher      Treadmill   MPH 2.5    Grade 0    Minutes 15    METs 2.91      Recumbant Bike   Level 2    RPM 50    Watts 25    Minutes 15    METs 1.97      NuStep   Level 2    SPM 80    Minutes 15    METs 1.97      REL-XR   Level 2    Watts 25    Speed 50    Minutes 15    METs 1.97      T5 Nustep   Level 2    SPM 80    Minutes 15     METs 1.97      Track   Laps 19    Minutes 15    METs 2.09      Intensity   THRR 40-80% of Max Heartrate 92-114    Ratings of Perceived Exertion 11-13    Perceived Dyspnea 0-4      Progression   Progression Continue to progress workloads to maintain intensity without signs/symptoms of physical distress.      Resistance Training   Training Prescription Yes    Weight 3 lb    Reps 10-15             Perform Capillary Blood Glucose checks as needed.  Exercise Prescription Changes:   Exercise Prescription Changes     Row Name 12/02/23 1500 12/11/23 1100 12/24/23 1400         Response to Exercise   Blood Pressure (Admit) 152/68 126/70 122/66     Blood Pressure (Exercise) 172/68 162/68 142/68     Blood Pressure (Exit) 146/70 116/62 118/60     Heart Rate (Admit) 70 bpm 72 bpm 73 bpm     Heart Rate (Exercise) 96 bpm 93 bpm 90 bpm     Heart Rate (Exit) 78 bpm 84 bpm 73 bpm     Oxygen Saturation (Admit) 96 % 95 % 97 %     Oxygen Saturation (Exercise) 93 % 93 % 91 %     Oxygen Saturation (Exit) 97 % 95 % 94 %     Rating of Perceived Exertion (Exercise) 11 14 13      Perceived Dyspnea (Exercise) 3 1 1      Symptoms none none none     Comments results First two days of exercise --     Duration -- Progress to 30 minutes of  aerobic without signs/symptoms of physical distress Continue with 30 min of aerobic exercise without signs/symptoms of physical distress.     Intensity -- THRR unchanged THRR unchanged       Progression   Progression -- Continue to progress workloads to maintain intensity without signs/symptoms of physical distress. Continue to progress workloads to maintain intensity without signs/symptoms of physical distress.     Average METs -- 2.46 2.3       Resistance Training   Training Prescription -- Yes Yes     Weight -- 3 lb 3 lb     Reps -- 10-15 10-15       Interval Training   Interval Training No No No       Recumbant  Bike   Level -- 2 --      Watts -- 11 --     Minutes -- 15 --     METs -- 3.2 --       NuStep   Level -- 2 3     Minutes -- 15 15     METs -- 2 2.5       REL-XR   Level -- 1 1     Minutes -- 15 15     METs -- 2 1.6       Track   Laps -- 30 30     Minutes -- 15 15     METs -- 2.63 2.63       Oxygen   Maintain Oxygen Saturation -- 88% or higher 88% or higher              Exercise Comments:   Exercise Comments     Row Name 12/04/23 1404           Exercise Comments First full day of exercise!  Patient was oriented to gym and equipment including functions, settings, policies, and procedures.  Patient's individual exercise prescription and treatment plan were reviewed.  All starting workloads were established based on the results of the 6 minute walk test done at initial orientation visit.  The plan for exercise progression was also introduced and progression will be customized based on patient's performance and goals.                Exercise Goals and Review:   Exercise Goals     Row Name 12/02/23 1522             Exercise Goals   Increase Physical Activity Yes       Intervention Develop an individualized exercise prescription for aerobic and resistive training based on initial evaluation findings, risk stratification, comorbidities and participant's personal goals.;Provide advice, education, support and counseling about physical activity/exercise needs.       Expected Outcomes Long Term: Exercising regularly at least 3-5 days a week.;Long Term: Add in home exercise to make exercise part of routine and to increase amount of physical activity.;Short Term: Attend rehab on a regular basis to increase amount of physical activity.       Increase Strength and Stamina Yes       Intervention Develop an individualized exercise prescription for aerobic and resistive training based on initial evaluation findings, risk stratification, comorbidities and participant's personal goals.;Provide advice,  education, support and counseling about physical activity/exercise needs.       Expected Outcomes Long Term: Improve cardiorespiratory fitness, muscular endurance and strength as measured by increased METs and functional capacity ( );Short Term: Perform resistance training exercises routinely during rehab and add in resistance training at home;Short Term: Increase workloads from initial exercise prescription for resistance, speed, and METs.       Able to understand and use rate of perceived exertion (RPE) scale Yes       Intervention Provide education and explanation on how to use RPE scale       Expected Outcomes Long Term:  Able to use RPE to guide intensity level when exercising independently;Short Term: Able to use RPE daily in rehab to express subjective intensity level       Able to understand and use Dyspnea scale Yes       Intervention Provide education and explanation on how to use Dyspnea scale       Expected Outcomes Long Term: Able  to use Dyspnea scale to guide intensity level when exercising independently;Short Term: Able to use Dyspnea scale daily in rehab to express subjective sense of shortness of breath during exertion       Knowledge and understanding of Target Heart Rate Range (THRR) Yes       Intervention Provide education and explanation of THRR including how the numbers were predicted and where they are located for reference       Expected Outcomes Long Term: Able to use THRR to govern intensity when exercising independently;Short Term: Able to use daily as guideline for intensity in rehab;Short Term: Able to state/look up THRR       Able to check pulse independently Yes       Intervention Review the importance of being able to check your own pulse for safety during independent exercise;Provide education and demonstration on how to check pulse in carotid and radial arteries.       Expected Outcomes Long Term: Able to check pulse independently and accurately;Short Term: Able to  explain why pulse checking is important during independent exercise       Understanding of Exercise Prescription Yes       Intervention Provide education, explanation, and written materials on patient's individual exercise prescription       Expected Outcomes Long Term: Able to explain home exercise prescription to exercise independently;Short Term: Able to explain program exercise prescription                Exercise Goals Re-Evaluation :  Exercise Goals Re-Evaluation     Row Name 12/04/23 1404 12/11/23 1140 12/24/23 1458         Exercise Goal Re-Evaluation   Exercise Goals Review Increase Physical Activity;Able to understand and use rate of perceived exertion (RPE) scale;Knowledge and understanding of Target Heart Rate Range (THRR);Understanding of Exercise Prescription;Increase Strength and Stamina;Able to understand and use Dyspnea scale;Able to check pulse independently Increase Physical Activity;Increase Strength and Stamina;Understanding of Exercise Prescription Increase Physical Activity;Increase Strength and Stamina;Understanding of Exercise Prescription     Comments Reviewed RPE and dyspnea scale, THR and program prescription with pt today.  Pt voiced understanding and was given a copy of goals to take home. Tennie is off to a good start in the program. She did well walking the track and was able to reach 30 laps. She also did well at level 2 on both the T4 nustep and recumbent bike, and level 1 on the XR. She also has done well with 3 lb hand weights for resistance training. We will continue to monitor her progress in the program. Amiaya is doing well in rehab. She continues to do well walking on the track as she has conssitently reached 30 laps. She also improved to level 3 on the T4 nustep and stayed consistent on the XR at level 1. We will continue to monitor her progress in the program.     Expected Outcomes Short: Use RPE daily to regulate intensity.  Long: Follow program  prescription in THR. Short: Continue to follow current exercise prescription. Long: Continue exercise to improve strength and stamina. Short: Increase to level 2 on the XR. Long: Continue exercise to improve strength and stamina.              Discharge Exercise Prescription (Final Exercise Prescription Changes):  Exercise Prescription Changes - 12/24/23 1400       Response to Exercise   Blood Pressure (Admit) 122/66    Blood Pressure (Exercise) 142/68  Blood Pressure (Exit) 118/60    Heart Rate (Admit) 73 bpm    Heart Rate (Exercise) 90 bpm    Heart Rate (Exit) 73 bpm    Oxygen Saturation (Admit) 97 %    Oxygen Saturation (Exercise) 91 %    Oxygen Saturation (Exit) 94 %    Rating of Perceived Exertion (Exercise) 13    Perceived Dyspnea (Exercise) 1    Symptoms none    Duration Continue with 30 min of aerobic exercise without signs/symptoms of physical distress.    Intensity THRR unchanged      Progression   Progression Continue to progress workloads to maintain intensity without signs/symptoms of physical distress.    Average METs 2.3      Resistance Training   Training Prescription Yes    Weight 3 lb    Reps 10-15      Interval Training   Interval Training No      NuStep   Level 3    Minutes 15    METs 2.5      REL-XR   Level 1    Minutes 15    METs 1.6      Track   Laps 30    Minutes 15    METs 2.63      Oxygen   Maintain Oxygen Saturation 88% or higher             Nutrition:  Target Goals: Understanding of nutrition guidelines, daily intake of sodium 1500mg , cholesterol 200mg , calories 30% from fat and 7% or less from saturated fats, daily to have 5 or more servings of fruits and vegetables.  Education: All About Nutrition: -Group instruction provided by verbal, written material, interactive activities, discussions, models, and posters to present general guidelines for heart healthy nutrition including fat, fiber, MyPlate, the role of sodium  in heart healthy nutrition, utilization of the nutrition label, and utilization of this knowledge for meal planning. Follow up email sent as well. Written material given at graduation. Flowsheet Row Pulmonary Rehab from 12/04/2023 in [facility] Cardiac and Pulmonary Rehab  Education need identified 12/04/23       Biometrics:  Pre Biometrics - 12/02/23 1523       Pre Biometrics   Height 5' 1.5" (1.562 m)    Weight 144 lb 4.8 oz (65.5 kg)    Waist Circumference 34.5 inches    Hip Circumference 40.5 inches    Waist to Hip Ratio 0.85 %    BMI (Calculated) 26.83    Single Leg Stand 6.7 seconds              Nutrition Therapy Plan and Nutrition Goals:   Nutrition Assessments:  MEDIFICTS Score Key: >=70 Need to make dietary changes  40-70 Heart Healthy Diet <= 40 Therapeutic Level Cholesterol Diet  Flowsheet Row Pulmonary Rehab from 12/02/2023 in [facility] Cardiac and Pulmonary Rehab  Picture Your Plate Total Score on Admission 63      Picture Your Plate Scores: <16 Unhealthy dietary pattern with much room for improvement. 41-50 Dietary pattern unlikely to meet recommendations for good health and room for improvement. 51-60 More healthful dietary pattern, with some room for improvement.  >60 Healthy dietary pattern, although there may be some specific behaviors that could be improved.   Nutrition Goals Re-Evaluation:   Nutrition Goals Discharge (Final Nutrition Goals Re-Evaluation):   Psychosocial: Target Goals: Acknowledge presence or absence of significant depression and/or stress, maximize coping skills, provide positive support system. Participant is able to verbalize types  and ability to use techniques and skills needed for reducing stress and depression.   Education: Stress, Anxiety, and Depression - Group verbal and visual presentation to define topics covered.  Reviews how body is impacted by stress, anxiety, and depression.  Also discusses healthy ways to reduce stress  and to treat/manage anxiety and depression.  Written material given at graduation. Flowsheet Row Pulmonary Rehab from 03/04/2019 in [facility] Cardiac and Pulmonary Rehab  Date 02/25/19  Educator KG  Instruction Review Code 1- [employer] Understanding       Education: Sleep Hygiene -Provides group verbal and written instruction about how sleep can affect your health.  Define sleep hygiene, discuss sleep cycles and impact of sleep habits. Review good sleep hygiene tips.    Initial Review & Psychosocial Screening:  Initial Psych Review & Screening - 11/28/23 1348       Initial Review   Current issues with History of Depression      Family Dynamics   Good Support System? Yes      Barriers   Psychosocial barriers to participate in program The patient should benefit from training in stress management and relaxation.      Screening Interventions   Interventions Encouraged to exercise;To provide support and resources with identified psychosocial needs;Provide feedback about the scores to participant    Expected Outcomes Short Term goal: Utilizing psychosocial counselor, staff and physician to assist with identification of specific Stressors or current issues interfering with healing process. Setting desired goal for each stressor or current issue identified.;Long Term Goal: Stressors or current issues are controlled or eliminated.;Short Term goal: Identification and review with participant of any Quality of Life or Depression concerns found by scoring the questionnaire.;Long Term goal: The participant improves quality of Life and PHQ9 Scores as seen by post scores and/or verbalization of changes             Quality of Life Scores:  Scores of 19 and below usually indicate a poorer quality of life in these areas.  A difference of  2-3 points is a clinically meaningful difference.  A difference of 2-3 points in the total score of the Quality of Life Index has been associated with significant  improvement in overall quality of life, self-image, physical symptoms, and general health in studies assessing change in quality of life.  PHQ-9: Review Flowsheet  More data exists      12/02/2023 06/24/2023 12/06/2022 10/02/2022 09/03/2022  Depression screen PHQ 2/9  Decreased Interest 1 0 0 0 0  Down, Depressed, Hopeless 0 0 0 0 0  PHQ - 2 Score 1 0 0 0 0  Altered sleeping 0 2 1 - -  Tired, decreased energy 1 0 1 - -  Change in appetite 0 0 0 - -  Feeling bad or failure about yourself  0 0 0 - -  Trouble concentrating 0 0 0 - -  Moving slowly or fidgety/restless 0 0 0 - -  Suicidal thoughts 0 0 0 - -  PHQ-9 Score 2 2 2  - -  Difficult doing work/chores Not difficult at all Not difficult at all Not difficult at all - -   Interpretation of Total Score  Total Score Depression Severity:  1-4 = Minimal depression, 5-9 = Mild depression, 10-14 = Moderate depression, 15-19 = Moderately severe depression, 20-27 = Severe depression   Psychosocial Evaluation and Intervention:  Psychosocial Evaluation - 11/28/23 1349       Psychosocial Evaluation & Interventions   Interventions Encouraged to exercise  with the program and follow exercise prescription    Comments Cherylanne is coming to pulmonary rehab with heart failure. She did the program in early 2020, but stopped due to the beginning of Covid. She states she stays very active for a woman in her 90s. She enjoys painting and walking. She recently moved to the independent living at [facility]. She says she just finished PT there for her ongoing vertigo problems and really enjoyed the staff there. She wants to get more active since she stayed indoors a lot during covid and is looking forward to attending the program. She does not report any stress concerns today, says she takes each day as it comes. She is motivated to attend the program so she can be udner supervision as she starts increasing her activity level.    Expected Outcomes Short: attend  cardiac rehab for educaiton and exercise. Long; Develop and maintain positive self care habits    Continue Psychosocial Services  Follow up required by staff             Psychosocial Re-Evaluation:   Psychosocial Discharge (Final Psychosocial Re-Evaluation):   Education: Education Goals: Education classes will be provided on a weekly basis, covering required topics. Participant will state understanding/return demonstration of topics presented.  Learning Barriers/Preferences:  Learning Barriers/Preferences - 11/28/23 1333       Learning Barriers/Preferences   Learning Barriers Hearing    Learning Preferences Individual Instruction             General Pulmonary Education Topics:  Infection Prevention: - Provides verbal and written material to individual with discussion of infection control including proper hand washing and proper equipment cleaning during exercise session. Flowsheet Row Pulmonary Rehab from 12/04/2023 in [facility] Cardiac and Pulmonary Rehab  Date 12/04/23  Educator [facility]  Instruction Review Code 1- Verbalizes Understanding       Falls Prevention: - Provides verbal and written material to individual with discussion of falls prevention and safety. Flowsheet Row Pulmonary Rehab from 12/04/2023 in [facility] Cardiac and Pulmonary Rehab  Date 12/04/23  Educator [facility]  Instruction Review Code 1- Verbalizes Understanding       Chronic Lung Disease Review: - Group verbal instruction with posters, models, PowerPoint presentations and videos,  to review new updates, new respiratory medications, new advancements in procedures and treatments. Providing information on websites and "800" numbers for continued self-education. Includes information about supplement oxygen, available portable oxygen systems, continuous and intermittent flow rates, oxygen safety, concentrators, and Medicare reimbursement for oxygen. Explanation of Pulmonary Drugs, including class, frequency,  complications, importance of spacers, rinsing mouth after steroid MDI's, and proper cleaning methods for nebulizers. Review of basic lung anatomy and physiology related to function, structure, and complications of lung disease. Review of risk factors. Discussion about methods for diagnosing sleep apnea and types of masks and machines for OSA. Includes a review of the use of types of environmental controls: home humidity, furnaces, filters, dust mite/pet prevention, HEPA vacuums. Discussion about weather changes, air quality and the benefits of nasal washing. Instruction on Warning signs, infection symptoms, calling MD promptly, preventive modes, and value of vaccinations. Review of effective airway clearance, coughing and/or vibration techniques. Emphasizing that all should Create an Action Plan. Written material given at graduation. Flowsheet Row Pulmonary Rehab from 12/04/2023 in [facility] Cardiac and Pulmonary Rehab  Date 12/04/23  Educator [facility]  Instruction Review Code 1- Verbalizes Understanding       AED/CPR: - Group verbal and written instruction with the use of models  to demonstrate the basic use of the AED with the basic ABC's of resuscitation.    Anatomy and Cardiac Procedures: - Group verbal and visual presentation and models provide information about basic cardiac anatomy and function. Reviews the testing methods done to diagnose heart disease and the outcomes of the test results. Describes the treatment choices: Medical Management, Angioplasty, or Coronary Bypass Surgery for treating various heart conditions including Myocardial Infarction, Angina, Valve Disease, and Cardiac Arrhythmias.  Written material given at graduation.   Medication Safety: - Group verbal and visual instruction to review commonly prescribed medications for heart and lung disease. Reviews the medication, class of the drug, and side effects. Includes the steps to properly store meds and maintain the prescription regimen.   Written material given at graduation.   Other: -Provides group and verbal instruction on various topics (see comments)   Knowledge Questionnaire Score:  Knowledge Questionnaire Score - 12/04/23 1400       Knowledge Questionnaire Score   Pre Score 22              Core Components/Risk Factors/Patient Goals at Admission:  Personal Goals and Risk Factors at Admission - 11/28/23 1332       Core Components/Risk Factors/Patient Goals on Admission   Improve shortness of breath with ADL's Yes    Intervention Provide education, individualized exercise plan and daily activity instruction to help decrease symptoms of SOB with activities of daily living.    Expected Outcomes Short Term: Improve cardiorespiratory fitness to achieve a reduction of symptoms when performing ADLs;Long Term: Be able to perform more ADLs without symptoms or delay the onset of symptoms    Diabetes Yes   Diet controlled   Intervention Provide education about signs/symptoms and action to take for hypo/hyperglycemia.;Provide education about proper nutrition, including hydration, and aerobic/resistive exercise prescription along with prescribed medications to achieve blood glucose in normal ranges: Fasting glucose 65-99 mg/dL    Expected Outcomes Short Term: Participant verbalizes understanding of the signs/symptoms and immediate care of hyper/hypoglycemia, proper foot care and importance of medication, aerobic/resistive exercise and nutrition plan for blood glucose control.;Long Term: Attainment of HbA1C < 7%.    Hypertension Yes    Intervention Provide education on lifestyle modifcations including regular physical activity/exercise, weight management, moderate sodium restriction and increased consumption of fresh fruit, vegetables, and low fat dairy, alcohol moderation, and smoking cessation.;Monitor prescription use compliance.    Expected Outcomes Short Term: Continued assessment and intervention until BP is < 140/97mm  HG in hypertensive participants. < 130/33mm HG in hypertensive participants with diabetes, heart failure or chronic kidney disease.;Long Term: Maintenance of blood pressure at goal levels.    Lipids Yes    Intervention Provide education and support for participant on nutrition & aerobic/resistive exercise along with prescribed medications to achieve LDL 70mg , HDL >40mg .    Expected Outcomes Short Term: Participant states understanding of desired cholesterol values and is compliant with medications prescribed. Participant is following exercise prescription and nutrition guidelines.;Long Term: Cholesterol controlled with medications as prescribed, with individualized exercise RX and with personalized nutrition plan. Value goals: LDL < 70mg , HDL > 40 mg.             Education:Diabetes - Individual verbal and written instruction to review signs/symptoms of diabetes, desired ranges of glucose level fasting, after meals and with exercise. Acknowledge that pre and post exercise glucose checks will be done for 3 sessions at entry of program.   Know Your Numbers and Heart Failure: - Group verbal  and visual instruction to discuss disease risk factors for cardiac and pulmonary disease and treatment options.  Reviews associated critical values for Overweight/Obesity, Hypertension, Cholesterol, and Diabetes.  Discusses basics of heart failure: signs/symptoms and treatments.  Introduces Heart Failure Zone chart for action plan for heart failure.  Written material given at graduation.   Core Components/Risk Factors/Patient Goals Review:    Core Components/Risk Factors/Patient Goals at Discharge (Final Review):    ITP Comments:  ITP Comments     Row Name 11/28/23 1339 12/02/23 1513 12/04/23 1404 12/11/23 1206 01/08/24 1319   ITP Comments Initial phone call completed. Diagnosis can be found in [facility] 11/1. EP Orientation scheduled for Monday 12/9 at 1:30. Completed and gym orientation. Initial ITP  created and sent for review to Dr. Faud Aleskerov, Medical Director. First full day of exercise!  Patient was oriented to gym and equipment including functions, settings, policies, and procedures.  Patient's individual exercise prescription and treatment plan were reviewed.  All starting workloads were established based on the results of the 6 minute walk test done at initial orientation visit.  The plan for exercise progression was also introduced and progression will be customized based on patient's performance and goals. 30 Day review completed. Medical Director ITP review done, changes made as directed, and signed approval by Medical Director.    new to program 30 Day review completed. Medical Director ITP review done, changes made as directed, and signed approval by Medical Director.    new to program            Comments: 30 day review

## 2024-01-13 ENCOUNTER — Encounter: Payer: Medicare Other | Admitting: *Deleted

## 2024-01-13 DIAGNOSIS — I5032 Chronic diastolic (congestive) heart failure: Secondary | ICD-10-CM

## 2024-01-13 NOTE — Progress Notes (Signed)
Daily Session Note  Patient Details  Name: Elizabeth [name] MRN: [id number] Date of Birth: [birth year] Referring Provider:   Flowsheet Row Pulmonary Rehab from 12/02/2023 in [facility] Cardiac and Pulmonary Rehab  Referring Provider Dr. Dale Viola       Encounter Date: 01/13/2024  Check In:  Session Check In - 01/13/24 1351       Check-In   Supervising physician immediately available to respond to emergencies See telemetry face sheet for immediately available ER MD    Location [name]    Staff Present Maxon Conetta BS, Exercise Physiologist;Noah Tickle, BS, Exercise Physiologist;Kyelle Urbas Jewel Baize RN,BSN;Joseph Hood RCP,RRT,BSRT    Virtual Visit No    Medication changes reported     No    Fall or balance concerns reported    No    Warm-up and Cool-down Performed on first and last piece of equipment    Resistance Training Performed Yes    VAD Patient? No    PAD/SET Patient? No      Pain Assessment   Currently in Pain? No/denies                Social History   Tobacco Use  Smoking Status Never  Smokeless Tobacco Never    Goals Met:  Independence with exercise equipment Exercise tolerated well No report of concerns or symptoms today Strength training completed today  Goals Unmet:  Not Applicable  Comments: Pt able to follow exercise prescription today without complaint.  Will continue to monitor for progression.    Dr. Bethann Punches is Medical Director for [facility] Cardiac Rehabilitation.  Dr. Vida Rigger is Medical Director for [facility] Pulmonary Rehabilitation.

## 2024-01-13 NOTE — Progress Notes (Signed)
Daily Session Note  Patient Details  Name: Elizabeth [name] MRN: [id number] Date of Birth: [birth year] Referring Provider:   Flowsheet Row Pulmonary Rehab from 12/02/2023 in [facility] Cardiac and Pulmonary Rehab  Referring Provider Dr. Dale [place]       Encounter Date: 01/13/2024  Check In:  Session Check In - 01/13/24 1351       Check-In   Supervising physician immediately available to respond to emergencies See telemetry face sheet for immediately available ER MD    Location [name]    Staff Present Maxon Conetta BS, Exercise Physiologist;Noah Tickle, BS, Exercise Physiologist;Meredith Jewel Baize RN,BSN;Joseph Hood RCP,RRT,BSRT    Virtual Visit No    Medication changes reported     No    Fall or balance concerns reported    No    Warm-up and Cool-down Performed on first and last piece of equipment    Resistance Training Performed Yes    VAD Patient? No    PAD/SET Patient? No      Pain Assessment   Currently in Pain? No/denies                Social History   Tobacco Use  Smoking Status Never  Smokeless Tobacco Never    Goals Met:  Independence with exercise equipment Exercise tolerated well No report of concerns or symptoms today Strength training completed today  Goals Unmet:  Not Applicable  Comments: Pt able to follow exercise prescription today without complaint.  Will continue to monitor for progression.    Reviewed home exercise with pt today from 2:01 pm to 2:15 pm.  Pt plans to walk and do balance exercises for exercise.  Reviewed THR, pulse, RPE, sign and symptoms, pulse oximetery and when to call 911 or MD.  Also discussed weather considerations and indoor options.  Pt voiced understanding.     Dr. Bethann Punches is Medical Director for [facility] Cardiac Rehabilitation.  Dr. Vida Rigger is Medical Director for [facility] Pulmonary Rehabilitation.

## 2024-01-16 ENCOUNTER — Encounter: Payer: Medicare Other | Admitting: *Deleted

## 2024-01-16 DIAGNOSIS — I5032 Chronic diastolic (congestive) heart failure: Secondary | ICD-10-CM

## 2024-01-16 NOTE — Progress Notes (Signed)
Daily Session Note  Patient Details  Name: Elizabeth [name] MRN: [id number] Date of Birth: February 19, 1927 Referring Provider:   Flowsheet Row Pulmonary Rehab from 12/02/2023 in [facility] Cardiac and Pulmonary Rehab  Referring Provider Dr. Dale [place]       Encounter Date: 01/16/2024  Check In:  Session Check In - 01/16/24 1341       Check-In   Supervising physician immediately available to respond to emergencies See telemetry face sheet for immediately available ER MD    Location [name]    Staff Present Maxon Conetta BS, Exercise Physiologist;Noah Tickle, BS, Exercise Physiologist;Susanne Bice, RN, BSN, CCRP;Valeta Paz RN,BSN    Virtual Visit No    Medication changes reported     No    Fall or balance concerns reported    No    Warm-up and Cool-down Performed on first and last piece of equipment    Resistance Training Performed Yes    VAD Patient? No    PAD/SET Patient? No      Pain Assessment   Currently in Pain? No/denies                Social History   Tobacco Use  Smoking Status Never  Smokeless Tobacco Never    Goals Met:  Independence with exercise equipment Exercise tolerated well No report of concerns or symptoms today Strength training completed today  Goals Unmet:  Not Applicable  Comments: Pt able to follow exercise prescription today without complaint.  Will continue to monitor for progression.    Dr. Bethann Punches is Medical Director for [facility] Cardiac Rehabilitation.  Dr. Vida Rigger is Medical Director for [facility] Pulmonary Rehabilitation.

## 2024-01-20 ENCOUNTER — Encounter: Payer: Medicare Other | Admitting: *Deleted

## 2024-01-20 DIAGNOSIS — I5032 Chronic diastolic (congestive) heart failure: Secondary | ICD-10-CM

## 2024-01-20 NOTE — Progress Notes (Signed)
Daily Session Note  Patient Details  Name: Elizabeth [name] MRN: [id number] Date of Birth: 21-Dec-1927 Referring Provider:   Flowsheet Row Pulmonary Rehab from 12/02/2023 in [facility] Cardiac and Pulmonary Rehab  Referring Provider Dr. Dale [place]       Encounter Date: 01/20/2024  Check In:  Session Check In - 01/20/24 1357       Check-In   Supervising physician immediately available to respond to emergencies See telemetry face sheet for immediately available ER MD    Location [name]    Staff Present Maxon Conetta BS, Exercise Physiologist;Noah Tickle, BS, Exercise Physiologist;Zafar Debrosse Jewel Baize RN,BSN;Joseph Hood RCP,RRT,BSRT    Virtual Visit No    Medication changes reported     No    Fall or balance concerns reported    No    Warm-up and Cool-down Performed on first and last piece of equipment    Resistance Training Performed Yes    VAD Patient? No    PAD/SET Patient? No      Pain Assessment   Currently in Pain? No/denies                Social History   Tobacco Use  Smoking Status Never  Smokeless Tobacco Never    Goals Met:  Independence with exercise equipment Exercise tolerated well No report of concerns or symptoms today Strength training completed today  Goals Unmet:  Not Applicable  Comments: Pt able to follow exercise prescription today without complaint.  Will continue to monitor for progression.    Dr. Bethann Punches is Medical Director for [facility] Cardiac Rehabilitation.  Dr. Vida Rigger is Medical Director for [facility] Pulmonary Rehabilitation.

## 2024-01-22 ENCOUNTER — Encounter: Payer: Medicare Other | Admitting: *Deleted

## 2024-01-22 DIAGNOSIS — I5032 Chronic diastolic (congestive) heart failure: Secondary | ICD-10-CM | POA: Diagnosis not present

## 2024-01-22 NOTE — Progress Notes (Signed)
Daily Session Note  Patient Details  Name: Elizabeth [name] MRN: [id number] Date of Birth: June 08, 1927 Referring Provider:   Flowsheet Row Pulmonary Rehab from 12/02/2023 in [facility] Cardiac and Pulmonary Rehab  Referring Provider Dr. Dale [place]       Encounter Date: 01/22/2024  Check In:  Session Check In - 01/22/24 1345       Check-In   Supervising physician immediately available to respond to emergencies See telemetry face sheet for immediately available ER MD    Location [name]    Staff Present Maxon Conetta BS, Exercise Physiologist;Kelly Cloretta Ned, ACSM CEP, Exercise Physiologist;Noah Tickle, BS, Exercise Physiologist;Keghan Mcfarren Jewel Baize RN,BSN    Virtual Visit No    Medication changes reported     No    Fall or balance concerns reported    No    Warm-up and Cool-down Performed on first and last piece of equipment    Resistance Training Performed Yes    VAD Patient? No    PAD/SET Patient? No      Pain Assessment   Currently in Pain? No/denies                Social History   Tobacco Use  Smoking Status Never  Smokeless Tobacco Never    Goals Met:  Independence with exercise equipment Exercise tolerated well No report of concerns or symptoms today Strength training completed today  Goals Unmet:  Not Applicable  Comments: Pt able to follow exercise prescription today without complaint.  Will continue to monitor for progression.    Dr. Bethann Punches is Medical Director for [facility] Cardiac Rehabilitation.  Dr. Vida Rigger is Medical Director for [facility] Pulmonary Rehabilitation.

## 2024-01-27 ENCOUNTER — Encounter: Payer: Medicare Other | Attending: Internal Medicine | Admitting: *Deleted

## 2024-01-27 DIAGNOSIS — I5032 Chronic diastolic (congestive) heart failure: Secondary | ICD-10-CM | POA: Insufficient documentation

## 2024-01-27 NOTE — Progress Notes (Signed)
Daily Session Note  Patient Details  Name: Elizabeth [name] MRN: [id number] Date of Birth: 1927-01-07 Referring Provider:   Flowsheet Row Pulmonary Rehab from 12/02/2023 in [facility] Cardiac and Pulmonary Rehab  Referring Provider Dr. Dale        Encounter Date: 01/27/2024  Check In:  Session Check In - 01/27/24 1355       Check-In   Supervising physician immediately available to respond to emergencies See telemetry face sheet for immediately available ER MD    Location [name]    Staff Present Maxon Conetta BS, Exercise Physiologist;Noah Tickle, BS, Exercise Physiologist;Dawnn Nam Jewel Baize RN,BSN;Joseph Hood RCP,RRT,BSRT    Virtual Visit No    Medication changes reported     No    Fall or balance concerns reported    No    Warm-up and Cool-down Performed on first and last piece of equipment    Resistance Training Performed Yes    VAD Patient? No    PAD/SET Patient? No      Pain Assessment   Currently in Pain? No/denies                Social History   Tobacco Use  Smoking Status Never  Smokeless Tobacco Never    Goals Met:  Independence with exercise equipment Exercise tolerated well No report of concerns or symptoms today Strength training completed today  Goals Unmet:  Not Applicable  Comments: Pt able to follow exercise prescription today without complaint.  Will continue to monitor for progression.    Dr. Bethann Punches is Medical Director for [facility] Cardiac Rehabilitation.  Dr. Vida Rigger is Medical Director for [facility] Pulmonary Rehabilitation.

## 2024-01-29 ENCOUNTER — Encounter: Payer: Medicare Other | Admitting: *Deleted

## 2024-01-29 DIAGNOSIS — I5032 Chronic diastolic (congestive) heart failure: Secondary | ICD-10-CM

## 2024-01-29 NOTE — Progress Notes (Signed)
 Daily Session Note  Patient Details  Name: Elizabeth [name] MRN: [id number] Date of Birth: December 09, 1927 Referring Provider:   Flowsheet Row Pulmonary Rehab from 12/02/2023 in [facility] Cardiac and Pulmonary Rehab  Referring Provider Dr. Allena Hamilton       Encounter Date: 01/29/2024  Check In:  Session Check In - 01/29/24 1350       Check-In   Supervising physician immediately available to respond to emergencies See telemetry face sheet for immediately available ER MD    Location [name]    Staff Present Hoy Rodney RN,BSN;Maxon Burnell BS, Exercise Physiologist;Kelly Dyane HECKLE, ACSM CEP, Exercise Physiologist;Noah Tickle, BS, Exercise Physiologist    Virtual Visit No    Medication changes reported     No    Fall or balance concerns reported    No    Warm-up and Cool-down Performed on first and last piece of equipment    Resistance Training Performed Yes    VAD Patient? No    PAD/SET Patient? No      Pain Assessment   Currently in Pain? No/denies                Social History   Tobacco Use  Smoking Status Never  Smokeless Tobacco Never    Goals Met:  Independence with exercise equipment Exercise tolerated well No report of concerns or symptoms today Strength training completed today  Goals Unmet:  Not Applicable  Comments: Pt able to follow exercise prescription today without complaint.  Will continue to monitor for progression.    Dr. Oneil Pinal is Medical Director for [facility] Cardiac Rehabilitation.  Dr. Fuad Aleskerov is Medical Director for [facility] Pulmonary Rehabilitation.

## 2024-01-30 ENCOUNTER — Encounter: Payer: Medicare Other | Admitting: *Deleted

## 2024-01-30 DIAGNOSIS — I5032 Chronic diastolic (congestive) heart failure: Secondary | ICD-10-CM

## 2024-01-30 NOTE — Progress Notes (Signed)
 Daily Session Note  Patient Details  Name: Elizabeth [name] MRN: [id number] Date of Birth: November 22, 1927 Referring Provider:   Flowsheet Row Pulmonary Rehab from 12/02/2023 in [facility] Cardiac and Pulmonary Rehab  Referring Provider Dr. Allena Hamilton       Encounter Date: 01/30/2024  Check In:  Session Check In - 01/30/24 1348       Check-In   Supervising physician immediately available to respond to emergencies See telemetry face sheet for immediately available ER MD    Location [name]    Staff Present Maxon Conetta BS, Exercise Physiologist;Noah Tickle, BS, Exercise Physiologist;Elasia Furnish Tressa RN,BSN;Joseph Hood RCP,RRT,BSRT    Virtual Visit No    Medication changes reported     No    Fall or balance concerns reported    No    Warm-up and Cool-down Performed on first and last piece of equipment    Resistance Training Performed Yes    VAD Patient? No    PAD/SET Patient? No      Pain Assessment   Currently in Pain? No/denies                Social History   Tobacco Use  Smoking Status Never  Smokeless Tobacco Never    Goals Met:  Independence with exercise equipment Exercise tolerated well No report of concerns or symptoms today Strength training completed today  Goals Unmet:  Not Applicable  Comments: Pt able to follow exercise prescription today without complaint.  Will continue to monitor for progression.    Dr. Oneil Pinal is Medical Director for [facility] Cardiac Rehabilitation.  Dr. Fuad Aleskerov is Medical Director for [facility] Pulmonary Rehabilitation.

## 2024-02-03 ENCOUNTER — Encounter: Payer: Medicare Other | Admitting: *Deleted

## 2024-02-03 DIAGNOSIS — I5032 Chronic diastolic (congestive) heart failure: Secondary | ICD-10-CM

## 2024-02-03 NOTE — Progress Notes (Signed)
 Daily Session Note  Patient Details  Name: Elizabeth [name] MRN: [id number] Date of Birth: January 04, 1927 Referring Provider:   Flowsheet Row Pulmonary Rehab from 12/02/2023 in [facility]  Referring Provider Dr. Dellar Fenton       Encounter Date: 02/03/2024  Check In:  Session Check In - 02/03/24 1356       Check-In   Supervising physician immediately available to respond to emergencies See telemetry face sheet for immediately available ER MD    Location [name]    Staff Present Sue Em RN,BSN;Joseph Lacinda Pica RCP,RRT,BSRT;Noah Tickle, [place], Exercise Physiologist;Maxon Conetta BS, Exercise Physiologist    Virtual Visit No    Medication changes reported     No    Fall or balance concerns reported    No    Warm-up and Cool-down Performed on first and last piece of equipment    Resistance Training Performed Yes    VAD Patient? No    PAD/SET Patient? No      Pain Assessment   Currently in Pain? No/denies                Social History   Tobacco Use  Smoking Status Never  Smokeless Tobacco Never    Goals Met:  Independence with exercise equipment Exercise tolerated well No report of concerns or symptoms today  Goals Unmet:  Not Applicable  Comments: Pt able to follow exercise prescription today without complaint.  Will continue to monitor for progression.    Dr. Firman Hughes is Medical Director for [facility] Cardiac Rehabilitation.  Dr. Fuad Aleskerov is Medical Director for [facility] Pulmonary Rehabilitation.

## 2024-02-05 ENCOUNTER — Encounter: Payer: Medicare Other | Admitting: *Deleted

## 2024-02-05 DIAGNOSIS — I5032 Chronic diastolic (congestive) heart failure: Secondary | ICD-10-CM

## 2024-02-05 NOTE — Progress Notes (Signed)
Pulmonary Individual Treatment Plan  Patient Details  Name: SHALIMAR MCCLAIN MRN: [id number] Date of Birth: 1927-01-21 Referring Provider:   Flowsheet Row Pulmonary Rehab from 12/02/2023 in [facility] Cardiac and Pulmonary Rehab  Referring Provider Dr. Dale Kittredge       Initial Encounter Date:  Flowsheet Row Pulmonary Rehab from 12/02/2023 in [facility] Cardiac and Pulmonary Rehab  Date 12/02/23       Visit Diagnosis: Heart failure, diastolic, chronic (HCC)  Patient's Home Medications on Admission:  Current Outpatient Medications:    amLODipine (NORVASC) 2.5 MG tablet, Take 1 tablet (2.5 mg total) by mouth daily., Disp: 90 tablet, Rfl: 3   aspirin 81 MG tablet, Take 81 mg by mouth daily., Disp: , Rfl:    atorvastatin (LIPITOR) 40 MG tablet, Take 1 tablet (40 mg total) by mouth daily., Disp: 90 tablet, Rfl: 3   cetirizine (ZYRTEC) 5 MG tablet, Take 5 mg by mouth daily as needed. , Disp: , Rfl:    lisinopril (ZESTRIL) 40 MG tablet, Take 1 tablet (40 mg total) by mouth daily., Disp: 90 tablet, Rfl: 3   meclizine (ANTIVERT) 12.5 MG tablet, Take 2 tablets (25 mg total) by mouth 3 (three) times daily as needed for dizziness., Disp: 30 tablet, Rfl: 0   metoprolol tartrate (LOPRESSOR) 25 MG tablet, Take 1 tablet (25 mg total) by mouth 2 (two) times daily., Disp: 180 tablet, Rfl: 3   OXYQUINOLONE SULFATE VAGINAL (TRIMO-SAN) 0.025 % GEL, Place 1 Applicatorful vaginally once a week., Disp: 113.4 g, Rfl: 3   triamcinolone (NASACORT) 55 MCG/ACT nasal inhaler, Place 2 sprays into the nose daily as needed. , Disp: , Rfl:   Past Medical History: Past Medical History:  Diagnosis Date   Cystocele or rectocele with incomplete uterine prolapse    Hyperlipidemia    Hypertension    Irritable bowel syndrome    Palpitations    Vaginal atrophy     Tobacco Use: Social History   Tobacco Use  Smoking Status Never  Smokeless Tobacco Never    Labs: Review Flowsheet  More data exists      Latest Ref Rng &  Units 08/28/2022 12/03/2022 03/07/2023 06/20/2023 10/22/2023  Labs for ITP Cardiac and Pulmonary Rehab  Cholestrol 0 - 200 mg/dL 409  811  914  782  956   LDL (calc) 0 - 99 mg/dL 95  98  - 79  98   Direct LDL mg/dL - - 213.0  - -  HDL-C >86.57 mg/dL 84.69  62.95  28.41  32.44  48.40   Trlycerides 0.0 - 149.0 mg/dL 010.2  725.3  664.4  034.7  164.0   Hemoglobin A1c 4.6 - 6.5 % 7.0  7.1  6.6  6.8  7.0      Pulmonary Assessment Scores:  Pulmonary Assessment Scores     Row Name 12/02/23 1526 12/04/23 1355       ADL UCSD   ADL Phase -- Entry    SOB Score total -- 2    Rest -- 1    Walk -- 0    Stairs -- 1    Bath -- 0    Dress -- 0    Shop -- 0      CAT Score   CAT Score 14 --      mMRC Score   mMRC Score -- 1             UCSD: Self-administered rating of dyspnea associated with activities of daily living (ADLs) 6-point scale (  0 = "not at all" to 5 = "maximal or unable to do because of breathlessness")  Scoring Scores range from 0 to 120.  Minimally important difference is 5 units  CAT: CAT can identify the health impairment of COPD patients and is better correlated with disease progression.  CAT has a scoring range of zero to 40. The CAT score is classified into four groups of low (less than 10), medium (10 - 20), high (21-30) and very high (31-40) based on the impact level of disease on health status. A CAT score over 10 suggests significant symptoms.  A worsening CAT score could be explained by an exacerbation, poor medication adherence, poor inhaler technique, or progression of COPD or comorbid conditions.  CAT MCID is 2 points  mMRC: mMRC (Modified Medical Research Council) Dyspnea Scale is used to assess the degree of baseline functional disability in patients of respiratory disease due to dyspnea. No minimal important difference is established. A decrease in score of 1 point or greater is considered a positive change.   Pulmonary Function Assessment:   Exercise  Target Goals: Exercise Program Goal: Individual exercise prescription set using results from initial 6 min walk test and THRR while considering  patient's activity barriers and safety.   Exercise Prescription Goal: Initial exercise prescription builds to 30-45 minutes a day of aerobic activity, 2-3 days per week.  Home exercise guidelines will be given to patient during program as part of exercise prescription that the participant will acknowledge.  Education: Aerobic Exercise: - Group verbal and visual presentation on the components of exercise prescription. Introduces F.I.T.T principle from ACSM for exercise prescriptions.  Reviews F.I.T.T. principles of aerobic exercise including progression. Written material given at graduation. Flowsheet Row Pulmonary Rehab from 03/04/2019 in [facility] Cardiac and Pulmonary Rehab  Date 01/16/19  Educator [facility]  Instruction Review Code 1- Verbalizes Understanding       Education: Resistance Exercise: - Group verbal and visual presentation on the components of exercise prescription. Introduces F.I.T.T principle from ACSM for exercise prescriptions  Reviews F.I.T.T. principles of resistance exercise including progression. Written material given at graduation.    Education: Exercise & Equipment Safety: - Individual verbal instruction and demonstration of equipment use and safety with use of the equipment. Flowsheet Row Pulmonary Rehab from 12/04/2023 in [facility] Cardiac and Pulmonary Rehab  Date 12/04/23  Educator [facility]  Instruction Review Code 1- Verbalizes Understanding       Education: Exercise Physiology & General Exercise Guidelines: - Group verbal and written instruction with models to review the exercise physiology of the cardiovascular system and associated critical values. Provides general exercise guidelines with specific guidelines to those with heart or lung disease.  Flowsheet Row Pulmonary Rehab from 12/04/2023 in [facility] Cardiac and Pulmonary Rehab   Education need identified 12/04/23       Education: Flexibility, Balance, Mind/Body Relaxation: - Group verbal and visual presentation with interactive activity on the components of exercise prescription. Introduces F.I.T.T principle from ACSM for exercise prescriptions. Reviews F.I.T.T. principles of flexibility and balance exercise training including progression. Also discusses the mind body connection.  Reviews various relaxation techniques to help reduce and manage stress (i.e. Deep breathing, progressive muscle relaxation, and visualization). Balance handout provided to take home. Written material given at graduation. Flowsheet Row Pulmonary Rehab from 03/04/2019 in [facility] Cardiac and Pulmonary Rehab  Date 01/21/19  Educator [facility]  Instruction Review Code 1- Verbalizes Understanding       Activity Barriers & Risk Stratification:  Activity Barriers & Cardiac Risk  Stratification - 12/02/23 1517       Activity Barriers & Cardiac Risk Stratification   Activity Barriers Balance Concerns;Shortness of Breath;Deconditioning;Muscular Weakness    Cardiac Risk Stratification Low             6 Minute Walk:  6 Minute Walk     Row Name 12/02/23 1514         6 Minute Walk   Phase Initial     Distance 1420 feet     Walk Time 6 minutes     # of Rest Breaks 0     MPH 2.7     METS 1.97     RPE 11     Perceived Dyspnea  3     VO2 Peak 6.9     Symptoms No     Resting HR 70 bpm     Resting BP 152/68     Resting Oxygen Saturation  96 %     Exercise Oxygen Saturation  during 6 min walk 93 %     Max Ex. HR 96 bpm     Max Ex. BP 172/68     2 Minute Post BP 146/70       Interval HR   1 Minute HR 87     2 Minute HR 86     3 Minute HR 91     4 Minute HR 95     5 Minute HR 96     6 Minute HR 71     2 Minute Post HR 78     Interval Heart Rate? Yes       Interval Oxygen   Interval Oxygen? Yes     Baseline Oxygen Saturation % 96 %     1 Minute Oxygen Saturation % 95 %     2  Minute Oxygen Saturation % 94 %     3 Minute Oxygen Saturation % 93 %     4 Minute Oxygen Saturation % 94 %     5 Minute Oxygen Saturation % 94 %     6 Minute Oxygen Saturation % 97 %     2 Minute Post Oxygen Saturation % 97 %             Oxygen Initial Assessment:  Oxygen Initial Assessment - 12/02/23 1529       Home Oxygen   Home Oxygen Device None    Sleep Oxygen Prescription None    Home Exercise Oxygen Prescription None    Home Resting Oxygen Prescription None    Compliance with Home Oxygen Use Yes      Initial 6 min Walk   Oxygen Used None      Program Oxygen Prescription   Program Oxygen Prescription None      Intervention   Short Term Goals To learn and understand importance of maintaining oxygen saturations>88%;To learn and demonstrate proper pursed lip breathing techniques or other breathing techniques. ;To learn and understand importance of monitoring SPO2 with pulse oximeter and demonstrate accurate use of the pulse oximeter.    Long  Term Goals Maintenance of O2 saturations>88%;Exhibits proper breathing techniques, such as pursed lip breathing or other method taught during program session;Verbalizes importance of monitoring SPO2 with pulse oximeter and return demonstration             Oxygen Re-Evaluation:  Oxygen Re-Evaluation     Row Name 01/20/24 1436             Program Oxygen Prescription  Program Oxygen Prescription None         Home Oxygen   Home Oxygen Device None       Sleep Oxygen Prescription None       Home Exercise Oxygen Prescription None       Home Resting Oxygen Prescription None       Compliance with Home Oxygen Use Yes         Goals/Expected Outcomes   Short Term Goals To learn and understand importance of maintaining oxygen saturations>88%;To learn and demonstrate proper pursed lip breathing techniques or other breathing techniques. ;To learn and understand importance of monitoring SPO2 with pulse oximeter and demonstrate  accurate use of the pulse oximeter.       Long  Term Goals Maintenance of O2 saturations>88%;Exhibits proper breathing techniques, such as pursed lip breathing or other method taught during program session;Verbalizes importance of monitoring SPO2 with pulse oximeter and return demonstration       Comments Verda Cumins states that she experiences some SOB at times which she attributes to her heart failure. We reviewed PLB technique today and encouraged her to practice PLB when feeling SOB.       Goals/Expected Outcomes Short: Become independent at home with PLB. Long: maintain independence with SOB management.                 Oxygen Discharge (Final Oxygen Re-Evaluation):  Oxygen Re-Evaluation - 01/20/24 1436       Program Oxygen Prescription   Program Oxygen Prescription None      Home Oxygen   Home Oxygen Device None    Sleep Oxygen Prescription None    Home Exercise Oxygen Prescription None    Home Resting Oxygen Prescription None    Compliance with Home Oxygen Use Yes      Goals/Expected Outcomes   Short Term Goals To learn and understand importance of maintaining oxygen saturations>88%;To learn and demonstrate proper pursed lip breathing techniques or other breathing techniques. ;To learn and understand importance of monitoring SPO2 with pulse oximeter and demonstrate accurate use of the pulse oximeter.    Long  Term Goals Maintenance of O2 saturations>88%;Exhibits proper breathing techniques, such as pursed lip breathing or other method taught during program session;Verbalizes importance of monitoring SPO2 with pulse oximeter and return demonstration    Comments Verda Cumins states that she experiences some SOB at times which she attributes to her heart failure. We reviewed PLB technique today and encouraged her to practice PLB when feeling SOB.    Goals/Expected Outcomes Short: Become independent at home with PLB. Long: maintain independence with SOB management.              Initial  Exercise Prescription:  Initial Exercise Prescription - 12/02/23 1500       Date of Initial Exercise RX and Referring Provider   Date 12/02/23    Referring Provider Dr. Dale [place]      Oxygen   Maintain Oxygen Saturation 88% or higher      Treadmill   MPH 2.5    Grade 0    Minutes 15    METs 2.91      Recumbant Bike   Level 2    RPM 50    Watts 25    Minutes 15    METs 1.97      NuStep   Level 2    SPM 80    Minutes 15    METs 1.97      REL-XR   Level 2  Watts 25    Speed 50    Minutes 15    METs 1.97      T5 Nustep   Level 2    SPM 80    Minutes 15    METs 1.97      Track   Laps 19    Minutes 15    METs 2.09      Intensity   THRR 40-80% of Max Heartrate 92-114    Ratings of Perceived Exertion 11-13    Perceived Dyspnea 0-4      Progression   Progression Continue to progress workloads to maintain intensity without signs/symptoms of physical distress.      Resistance Training   Training Prescription Yes    Weight 3 lb    Reps 10-15             Perform Capillary Blood Glucose checks as needed.  Exercise Prescription Changes:   Exercise Prescription Changes     Row Name 12/02/23 1500 12/11/23 1100 12/24/23 1400 01/13/24 1400 01/23/24 0800     Response to Exercise   Blood Pressure (Admit) 152/68 126/70 122/66 -- 126/62   Blood Pressure (Exercise) 172/68 162/68 142/68 -- 144/70   Blood Pressure (Exit) 146/70 116/62 118/60 -- 104/60   Heart Rate (Admit) 70 bpm 72 bpm 73 bpm -- 68 bpm   Heart Rate (Exercise) 96 bpm 93 bpm 90 bpm -- 95 bpm   Heart Rate (Exit) 78 bpm 84 bpm 73 bpm -- 79 bpm   Oxygen Saturation (Admit) 96 % 95 % 97 % -- 92 %   Oxygen Saturation (Exercise) 93 % 93 % 91 % -- 94 %   Oxygen Saturation (Exit) 97 % 95 % 94 % -- 94 %   Rating of Perceived Exertion (Exercise) 11 14 13  -- 11   Perceived Dyspnea (Exercise) 3 1 1  -- 1   Symptoms none none none -- none   Comments results First two days of exercise -- -- --    Duration -- Progress to 30 minutes of  aerobic without signs/symptoms of physical distress Continue with 30 min of aerobic exercise without signs/symptoms of physical distress. Continue with 30 min of aerobic exercise without signs/symptoms of physical distress. Continue with 30 min of aerobic exercise without signs/symptoms of physical distress.   Intensity -- THRR unchanged THRR unchanged THRR unchanged THRR unchanged     Progression   Progression -- Continue to progress workloads to maintain intensity without signs/symptoms of physical distress. Continue to progress workloads to maintain intensity without signs/symptoms of physical distress. Continue to progress workloads to maintain intensity without signs/symptoms of physical distress. Continue to progress workloads to maintain intensity without signs/symptoms of physical distress.   Average METs -- 2.46 2.3 2.3 3.07     Resistance Training   Training Prescription -- Yes Yes Yes Yes   Weight -- 3 lb 3 lb 3 lb 3 lb   Reps -- 10-15 10-15 10-15 10-15     Interval Training   Interval Training No No No No No     Recumbant Bike   Level -- 2 -- -- --   Watts -- 11 -- -- --   Minutes -- 15 -- -- --   METs -- 3.2 -- -- --     NuStep   Level -- 2 3 3  --   Minutes -- 15 15 15  --   METs -- 2 2.5 2.5 --     REL-XR   Level --  1 1 1 2    Minutes -- 15 15 15 15    METs -- 2 1.6 1.6 1.8     Track   Laps -- 30 30 30  53   Minutes -- 15 15 15 15    METs -- 2.63 2.63 2.63 3.83     Home Exercise Plan   Plans to continue exercise at -- -- -- Home (comment)  walk and do balance exercises Home (comment)  walk and do balance exercises   Frequency -- -- -- Add 2 additional days to program exercise sessions. Add 2 additional days to program exercise sessions.   Initial Home Exercises Provided -- -- -- 01/13/24 01/13/24     Oxygen   Maintain Oxygen Saturation -- 88% or higher 88% or higher 88% or higher 88% or higher            Exercise  Comments:   Exercise Comments     Row Name 12/04/23 1404           Exercise Comments First full day of exercise!  Patient was oriented to gym and equipment including functions, settings, policies, and procedures.  Patient's individual exercise prescription and treatment plan were reviewed.  All starting workloads were established based on the results of the 6 minute walk test done at initial orientation visit.  The plan for exercise progression was also introduced and progression will be customized based on patient's performance and goals.                Exercise Goals and Review:   Exercise Goals     Row Name 12/02/23 1522             Exercise Goals   Increase Physical Activity Yes       Intervention Develop an individualized exercise prescription for aerobic and resistive training based on initial evaluation findings, risk stratification, comorbidities and participant's personal goals.;Provide advice, education, support and counseling about physical activity/exercise needs.       Expected Outcomes Long Term: Exercising regularly at least 3-5 days a week.;Long Term: Add in home exercise to make exercise part of routine and to increase amount of physical activity.;Short Term: Attend rehab on a regular basis to increase amount of physical activity.       Increase Strength and Stamina Yes       Intervention Develop an individualized exercise prescription for aerobic and resistive training based on initial evaluation findings, risk stratification, comorbidities and participant's personal goals.;Provide advice, education, support and counseling about physical activity/exercise needs.       Expected Outcomes Long Term: Improve cardiorespiratory fitness, muscular endurance and strength as measured by increased METs and functional capacity ( );Short Term: Perform resistance training exercises routinely during rehab and add in resistance training at home;Short Term: Increase workloads  from initial exercise prescription for resistance, speed, and METs.       Able to understand and use rate of perceived exertion (RPE) scale Yes       Intervention Provide education and explanation on how to use RPE scale       Expected Outcomes Long Term:  Able to use RPE to guide intensity level when exercising independently;Short Term: Able to use RPE daily in rehab to express subjective intensity level       Able to understand and use Dyspnea scale Yes       Intervention Provide education and explanation on how to use Dyspnea scale       Expected Outcomes Long Term: Able to  use Dyspnea scale to guide intensity level when exercising independently;Short Term: Able to use Dyspnea scale daily in rehab to express subjective sense of shortness of breath during exertion       Knowledge and understanding of Target Heart Rate Range (THRR) Yes       Intervention Provide education and explanation of THRR including how the numbers were predicted and where they are located for reference       Expected Outcomes Long Term: Able to use THRR to govern intensity when exercising independently;Short Term: Able to use daily as guideline for intensity in rehab;Short Term: Able to state/look up THRR       Able to check pulse independently Yes       Intervention Review the importance of being able to check your own pulse for safety during independent exercise;Provide education and demonstration on how to check pulse in carotid and radial arteries.       Expected Outcomes Long Term: Able to check pulse independently and accurately;Short Term: Able to explain why pulse checking is important during independent exercise       Understanding of Exercise Prescription Yes       Intervention Provide education, explanation, and written materials on patient's individual exercise prescription       Expected Outcomes Long Term: Able to explain home exercise prescription to exercise independently;Short Term: Able to explain program  exercise prescription                Exercise Goals Re-Evaluation :  Exercise Goals Re-Evaluation     Row Name 12/04/23 1404 12/11/23 1140 12/24/23 1458 01/13/24 1435 01/23/24 0819     Exercise Goal Re-Evaluation   Exercise Goals Review Increase Physical Activity;Able to understand and use rate of perceived exertion (RPE) scale;Knowledge and understanding of Target Heart Rate Range (THRR);Understanding of Exercise Prescription;Increase Strength and Stamina;Able to understand and use Dyspnea scale;Able to check pulse independently Increase Physical Activity;Increase Strength and Stamina;Understanding of Exercise Prescription Increase Physical Activity;Increase Strength and Stamina;Understanding of Exercise Prescription Increase Physical Activity;Able to understand and use rate of perceived exertion (RPE) scale;Knowledge and understanding of Target Heart Rate Range (THRR);Understanding of Exercise Prescription;Increase Strength and Stamina;Able to understand and use Dyspnea scale;Able to check pulse independently Increase Physical Activity;Increase Strength and Stamina;Understanding of Exercise Prescription   Comments Reviewed RPE and dyspnea scale, THR and program prescription with pt today.  Pt voiced understanding and was given a copy of goals to take home. Jamera is off to a good start in the program. She did well walking the track and was able to reach 30 laps. She also did well at level 2 on both the T4 nustep and recumbent bike, and level 1 on the XR. She also has done well with 3 lb hand weights for resistance training. We will continue to monitor her progress in the program. Courtnay is doing well in rehab. She continues to do well walking on the track as she has conssitently reached 30 laps. She also improved to level 3 on the T4 nustep and stayed consistent on the XR at level 1. We will continue to monitor her progress in the program. Reviewed home exercise with pt today from 2:01 pm to 2:15  pm.  Pt plans to walk and do balance exercises for exercise.  Reviewed THR, pulse, RPE, sign and symptoms, pulse oximetery and when to call 911 or MD.  Also discussed weather considerations and indoor options.  Pt voiced understanding. Raniya continues to do well in rehab.  She was able to increase her laps on the track from 30laps to 53, in 15 minutes. She was also able to increase her level on the XR from level 1 to 2. WE will continue to encourage and monitor her progress in the program.   Expected Outcomes Short: Use RPE daily to regulate intensity.  Long: Follow program prescription in THR. Short: Continue to follow current exercise prescription. Long: Continue exercise to improve strength and stamina. Short: Increase to level 2 on the XR. Long: Continue exercise to improve strength and stamina. Short: add 1-2 days a week of exercise at home on off days of rehab. Long: maintain independent exercise routine. Short: Continue to push for more track laps. Long: Continue exercise to increase strength and stamina.            Discharge Exercise Prescription (Final Exercise Prescription Changes):  Exercise Prescription Changes - 01/23/24 0800       Response to Exercise   Blood Pressure (Admit) 126/62    Blood Pressure (Exercise) 144/70    Blood Pressure (Exit) 104/60    Heart Rate (Admit) 68 bpm    Heart Rate (Exercise) 95 bpm    Heart Rate (Exit) 79 bpm    Oxygen Saturation (Admit) 92 %    Oxygen Saturation (Exercise) 94 %    Oxygen Saturation (Exit) 94 %    Rating of Perceived Exertion (Exercise) 11    Perceived Dyspnea (Exercise) 1    Symptoms none    Duration Continue with 30 min of aerobic exercise without signs/symptoms of physical distress.    Intensity THRR unchanged      Progression   Progression Continue to progress workloads to maintain intensity without signs/symptoms of physical distress.    Average METs 3.07      Resistance Training   Training Prescription Yes    Weight  3 lb    Reps 10-15      Interval Training   Interval Training No      REL-XR   Level 2    Minutes 15    METs 1.8      Track   Laps 53    Minutes 15    METs 3.83      Home Exercise Plan   Plans to continue exercise at Home (comment)   walk and do balance exercises   Frequency Add 2 additional days to program exercise sessions.    Initial Home Exercises Provided 01/13/24      Oxygen   Maintain Oxygen Saturation 88% or higher             Nutrition:  Target Goals: Understanding of nutrition guidelines, daily intake of sodium 1500mg , cholesterol 200mg , calories 30% from fat and 7% or less from saturated fats, daily to have 5 or more servings of fruits and vegetables.  Education: All About Nutrition: -Group instruction provided by verbal, written material, interactive activities, discussions, models, and posters to present general guidelines for heart healthy nutrition including fat, fiber, MyPlate, the role of sodium in heart healthy nutrition, utilization of the nutrition label, and utilization of this knowledge for meal planning. Follow up email sent as well. Written material given at graduation. Flowsheet Row Pulmonary Rehab from 12/04/2023 in [facility] Cardiac and Pulmonary Rehab  Education need identified 12/04/23       Biometrics:  Pre Biometrics - 12/02/23 1523       Pre Biometrics   Height 5' 1.5" (1.562 m)    Weight 144 lb 4.8 oz (65.5  kg)    Waist Circumference 34.5 inches    Hip Circumference 40.5 inches    Waist to Hip Ratio 0.85 %    BMI (Calculated) 26.83    Single Leg Stand 6.7 seconds              Nutrition Therapy Plan and Nutrition Goals:   Nutrition Assessments:  MEDIFICTS Score Key: >=70 Need to make dietary changes  40-70 Heart Healthy Diet <= 40 Therapeutic Level Cholesterol Diet  Flowsheet Row Pulmonary Rehab from 12/02/2023 in [facility] Cardiac and Pulmonary Rehab  Picture Your Plate Total Score on Admission 63      Picture Your  Plate Scores: <65 Unhealthy dietary pattern with much room for improvement. 41-50 Dietary pattern unlikely to meet recommendations for good health and room for improvement. 51-60 More healthful dietary pattern, with some room for improvement.  >60 Healthy dietary pattern, although there may be some specific behaviors that could be improved.   Nutrition Goals Re-Evaluation:  Nutrition Goals Re-Evaluation     Row Name 01/20/24 1428             Goals   Nutrition Goal Rd appt Deferred at this time.                Nutrition Goals Discharge (Final Nutrition Goals Re-Evaluation):  Nutrition Goals Re-Evaluation - 01/20/24 1428       Goals   Nutrition Goal Rd appt Deferred at this time.             Psychosocial: Target Goals: Acknowledge presence or absence of significant depression and/or stress, maximize coping skills, provide positive support system. Participant is able to verbalize types and ability to use techniques and skills needed for reducing stress and depression.   Education: Stress, Anxiety, and Depression - Group verbal and visual presentation to define topics covered.  Reviews how body is impacted by stress, anxiety, and depression.  Also discusses healthy ways to reduce stress and to treat/manage anxiety and depression.  Written material given at graduation. Flowsheet Row Pulmonary Rehab from 03/04/2019 in [facility] Cardiac and Pulmonary Rehab  Date 02/25/19  Educator KG  Instruction Review Code 1- [employer] Understanding       Education: Sleep Hygiene -Provides group verbal and written instruction about how sleep can affect your health.  Define sleep hygiene, discuss sleep cycles and impact of sleep habits. Review good sleep hygiene tips.    Initial Review & Psychosocial Screening:  Initial Psych Review & Screening - 11/28/23 1348       Initial Review   Current issues with History of Depression      Family Dynamics   Good Support System? Yes       Barriers   Psychosocial barriers to participate in program The patient should benefit from training in stress management and relaxation.      Screening Interventions   Interventions Encouraged to exercise;To provide support and resources with identified psychosocial needs;Provide feedback about the scores to participant    Expected Outcomes Short Term goal: Utilizing psychosocial counselor, staff and physician to assist with identification of specific Stressors or current issues interfering with healing process. Setting desired goal for each stressor or current issue identified.;Long Term Goal: Stressors or current issues are controlled or eliminated.;Short Term goal: Identification and review with participant of any Quality of Life or Depression concerns found by scoring the questionnaire.;Long Term goal: The participant improves quality of Life and PHQ9 Scores as seen by post scores and/or verbalization of changes  Quality of Life Scores:  Scores of 19 and below usually indicate a poorer quality of life in these areas.  A difference of  2-3 points is a clinically meaningful difference.  A difference of 2-3 points in the total score of the Quality of Life Index has been associated with significant improvement in overall quality of life, self-image, physical symptoms, and general health in studies assessing change in quality of life.  PHQ-9: Review Flowsheet  More data exists      12/02/2023 06/24/2023 12/06/2022 10/02/2022 09/03/2022  Depression screen PHQ 2/9  Decreased Interest 1 0 0 0 0  Down, Depressed, Hopeless 0 0 0 0 0  PHQ - 2 Score 1 0 0 0 0  Altered sleeping 0 2 1 - -  Tired, decreased energy 1 0 1 - -  Change in appetite 0 0 0 - -  Feeling bad or failure about yourself  0 0 0 - -  Trouble concentrating 0 0 0 - -  Moving slowly or fidgety/restless 0 0 0 - -  Suicidal thoughts 0 0 0 - -  PHQ-9 Score 2 2 2  - -  Difficult doing work/chores Not difficult at all Not  difficult at all Not difficult at all - -   Interpretation of Total Score  Total Score Depression Severity:  1-4 = Minimal depression, 5-9 = Mild depression, 10-14 = Moderate depression, 15-19 = Moderately severe depression, 20-27 = Severe depression   Psychosocial Evaluation and Intervention:  Psychosocial Evaluation - 11/28/23 1349       Psychosocial Evaluation & Interventions   Interventions Encouraged to exercise with the program and follow exercise prescription    Comments Jaleeya is coming to pulmonary rehab with heart failure. She did the program in early 2020, but stopped due to the beginning of Covid. She states she stays very active for a woman in her 90s. She enjoys painting and walking. She recently moved to the independent living at [facility]. She says she just finished PT there for her ongoing vertigo problems and really enjoyed the staff there. She wants to get more active since she stayed indoors a lot during covid and is looking forward to attending the program. She does not report any stress concerns today, says she takes each day as it comes. She is motivated to attend the program so she can be udner supervision as she starts increasing her activity level.    Expected Outcomes Short: attend cardiac rehab for educaiton and exercise. Long; Develop and maintain positive self care habits    Continue Psychosocial Services  Follow up required by staff             Psychosocial Re-Evaluation:  Psychosocial Re-Evaluation     Row Name 01/20/24 1428             Psychosocial Re-Evaluation   Current issues with Current Stress Concerns       Comments Braelee states that she experiences some stress with the current state of the world and has had to stop watching the news. She aslo reports that holidays are hard for her, but she is ready to get back to a regular routine since we are past the holiday season. She is mostly sleeping well, although she does wake up occasionally  throughout the night. Her family is a good support system when they call to check on her.       Expected Outcomes Short: Continue to attend rehab for mental boost. Long: Continue to maintain positive  outlook.       Continue Psychosocial Services  Follow up required by staff                Psychosocial Discharge (Final Psychosocial Re-Evaluation):  Psychosocial Re-Evaluation - 01/20/24 1428       Psychosocial Re-Evaluation   Current issues with Current Stress Concerns    Comments Tamyia states that she experiences some stress with the current state of the world and has had to stop watching the news. She aslo reports that holidays are hard for her, but she is ready to get back to a regular routine since we are past the holiday season. She is mostly sleeping well, although she does wake up occasionally throughout the night. Her family is a good support system when they call to check on her.    Expected Outcomes Short: Continue to attend rehab for mental boost. Long: Continue to maintain positive outlook.    Continue Psychosocial Services  Follow up required by staff             Education: Education Goals: Education classes will be provided on a weekly basis, covering required topics. Participant will state understanding/return demonstration of topics presented.  Learning Barriers/Preferences:  Learning Barriers/Preferences - 11/28/23 1333       Learning Barriers/Preferences   Learning Barriers Hearing    Learning Preferences Individual Instruction             General Pulmonary Education Topics:  Infection Prevention: - Provides verbal and written material to individual with discussion of infection control including proper hand washing and proper equipment cleaning during exercise session. Flowsheet Row Pulmonary Rehab from 12/04/2023 in [facility] Cardiac and Pulmonary Rehab  Date 12/04/23  Educator [facility]  Instruction Review Code 1- Verbalizes Understanding       Falls  Prevention: - Provides verbal and written material to individual with discussion of falls prevention and safety. Flowsheet Row Pulmonary Rehab from 12/04/2023 in [facility] Cardiac and Pulmonary Rehab  Date 12/04/23  Educator [facility]  Instruction Review Code 1- Verbalizes Understanding       Chronic Lung Disease Review: - Group verbal instruction with posters, models, PowerPoint presentations and videos,  to review new updates, new respiratory medications, new advancements in procedures and treatments. Providing information on websites and "800" numbers for continued self-education. Includes information about supplement oxygen, available portable oxygen systems, continuous and intermittent flow rates, oxygen safety, concentrators, and Medicare reimbursement for oxygen. Explanation of Pulmonary Drugs, including class, frequency, complications, importance of spacers, rinsing mouth after steroid MDI's, and proper cleaning methods for nebulizers. Review of basic lung anatomy and physiology related to function, structure, and complications of lung disease. Review of risk factors. Discussion about methods for diagnosing sleep apnea and types of masks and machines for OSA. Includes a review of the use of types of environmental controls: home humidity, furnaces, filters, dust mite/pet prevention, HEPA vacuums. Discussion about weather changes, air quality and the benefits of nasal washing. Instruction on Warning signs, infection symptoms, calling MD promptly, preventive modes, and value of vaccinations. Review of effective airway clearance, coughing and/or vibration techniques. Emphasizing that all should Create an Action Plan. Written material given at graduation. Flowsheet Row Pulmonary Rehab from 12/04/2023 in [facility] Cardiac and Pulmonary Rehab  Date 12/04/23  Educator [facility]  Instruction Review Code 1- Verbalizes Understanding       AED/CPR: - Group verbal and written instruction with the use of models to  demonstrate the basic use of the AED with the basic ABC's  of resuscitation.    Anatomy and Cardiac Procedures: - Group verbal and visual presentation and models provide information about basic cardiac anatomy and function. Reviews the testing methods done to diagnose heart disease and the outcomes of the test results. Describes the treatment choices: Medical Management, Angioplasty, or Coronary Bypass Surgery for treating various heart conditions including Myocardial Infarction, Angina, Valve Disease, and Cardiac Arrhythmias.  Written material given at graduation.   Medication Safety: - Group verbal and visual instruction to review commonly prescribed medications for heart and lung disease. Reviews the medication, class of the drug, and side effects. Includes the steps to properly store meds and maintain the prescription regimen.  Written material given at graduation.   Other: -Provides group and verbal instruction on various topics (see comments)   Knowledge Questionnaire Score:  Knowledge Questionnaire Score - 12/04/23 1400       Knowledge Questionnaire Score   Pre Score 22              Core Components/Risk Factors/Patient Goals at Admission:  Personal Goals and Risk Factors at Admission - 11/28/23 1332       Core Components/Risk Factors/Patient Goals on Admission   Improve shortness of breath with ADL's Yes    Intervention Provide education, individualized exercise plan and daily activity instruction to help decrease symptoms of SOB with activities of daily living.    Expected Outcomes Short Term: Improve cardiorespiratory fitness to achieve a reduction of symptoms when performing ADLs;Long Term: Be able to perform more ADLs without symptoms or delay the onset of symptoms    Diabetes Yes   Diet controlled   Intervention Provide education about signs/symptoms and action to take for hypo/hyperglycemia.;Provide education about proper nutrition, including hydration, and  aerobic/resistive exercise prescription along with prescribed medications to achieve blood glucose in normal ranges: Fasting glucose 65-99 mg/dL    Expected Outcomes Short Term: Participant verbalizes understanding of the signs/symptoms and immediate care of hyper/hypoglycemia, proper foot care and importance of medication, aerobic/resistive exercise and nutrition plan for blood glucose control.;Long Term: Attainment of HbA1C < 7%.    Hypertension Yes    Intervention Provide education on lifestyle modifcations including regular physical activity/exercise, weight management, moderate sodium restriction and increased consumption of fresh fruit, vegetables, and low fat dairy, alcohol moderation, and smoking cessation.;Monitor prescription use compliance.    Expected Outcomes Short Term: Continued assessment and intervention until BP is < 140/93mm HG in hypertensive participants. < 130/36mm HG in hypertensive participants with diabetes, heart failure or chronic kidney disease.;Long Term: Maintenance of blood pressure at goal levels.    Lipids Yes    Intervention Provide education and support for participant on nutrition & aerobic/resistive exercise along with prescribed medications to achieve LDL 70mg , HDL >40mg .    Expected Outcomes Short Term: Participant states understanding of desired cholesterol values and is compliant with medications prescribed. Participant is following exercise prescription and nutrition guidelines.;Long Term: Cholesterol controlled with medications as prescribed, with individualized exercise RX and with personalized nutrition plan. Value goals: LDL < 70mg , HDL > 40 mg.             Education:Diabetes - Individual verbal and written instruction to review signs/symptoms of diabetes, desired ranges of glucose level fasting, after meals and with exercise. Acknowledge that pre and post exercise glucose checks will be done for 3 sessions at entry of program.   Know Your Numbers and  Heart Failure: - Group verbal and visual instruction to discuss disease risk factors for cardiac and pulmonary  disease and treatment options.  Reviews associated critical values for Overweight/Obesity, Hypertension, Cholesterol, and Diabetes.  Discusses basics of heart failure: signs/symptoms and treatments.  Introduces Heart Failure Zone chart for action plan for heart failure.  Written material given at graduation.   Core Components/Risk Factors/Patient Goals Review:   Goals and Risk Factor Review     Row Name 01/20/24 1432             Core Components/Risk Factors/Patient Goals Review   Personal Goals Review Hypertension;Diabetes       Review Verda Cumins states that her blood sugar is well regulated by her diet per her doctor. She states that she does not check her blood sugars routinely but her doctor is pleased with where she is at this time. She also is occasionally checking her blood pressure which she reports has stayed within normal ranges.       Expected Outcomes Short: Continue to routinely check BP. Long: Continue to manage lifestyle risk factors.                Core Components/Risk Factors/Patient Goals at Discharge (Final Review):   Goals and Risk Factor Review - 01/20/24 1432       Core Components/Risk Factors/Patient Goals Review   Personal Goals Review Hypertension;Diabetes    Review Verda Cumins states that her blood sugar is well regulated by her diet per her doctor. She states that she does not check her blood sugars routinely but her doctor is pleased with where she is at this time. She also is occasionally checking her blood pressure which she reports has stayed within normal ranges.    Expected Outcomes Short: Continue to routinely check BP. Long: Continue to manage lifestyle risk factors.             ITP Comments:  ITP Comments     Row Name 11/28/23 1339 12/02/23 1513 12/04/23 1404 12/11/23 1206 01/08/24 1319   ITP Comments Initial phone call completed.  Diagnosis can be found in [facility] 11/1. EP Orientation scheduled for Monday 12/9 at 1:30. Completed and gym orientation. Initial ITP created and sent for review to Dr. Jinny Sanders, Medical Director. First full day of exercise!  Patient was oriented to gym and equipment including functions, settings, policies, and procedures.  Patient's individual exercise prescription and treatment plan were reviewed.  All starting workloads were established based on the results of the 6 minute walk test done at initial orientation visit.  The plan for exercise progression was also introduced and progression will be customized based on patient's performance and goals. 30 Day review completed. Medical Director ITP review done, changes made as directed, and signed approval by Medical Director.    new to program 30 Day review completed. Medical Director ITP review done, changes made as directed, and signed approval by Medical Director.    new to program    Row Name 02/05/24 0841           ITP Comments 30 Day review completed. Medical Director ITP review done, changes made as directed, and signed approval by Medical Director.                Comments: 30 day review

## 2024-02-05 NOTE — Progress Notes (Signed)
Daily Session Note  Patient Details  Name: Elizabeth [name] MRN: [id number] Date of Birth: 16-Jun-1927 Referring Provider:   Flowsheet Row Pulmonary Rehab from 12/02/2023 in [facility] Cardiac and Pulmonary Rehab  Referring Provider Dr. Dale Cornville       Encounter Date: 02/05/2024  Check In:  Session Check In - 02/05/24 1348       Check-In   Supervising physician immediately available to respond to emergencies See telemetry face sheet for immediately available ER MD    Location [name]    Staff Present Susann Givens RN,BSN;Maxon Manya Silvas BS, Exercise Physiologist;Kelly Cloretta Ned, ACSM CEP, Exercise Physiologist;Noah Tickle, BS, Exercise Physiologist    Virtual Visit No    Medication changes reported     No    Fall or balance concerns reported    No    Warm-up and Cool-down Performed on first and last piece of equipment    Resistance Training Performed Yes    VAD Patient? No    PAD/SET Patient? No      Pain Assessment   Currently in Pain? No/denies                Social History   Tobacco Use  Smoking Status Never  Smokeless Tobacco Never    Goals Met:  Independence with exercise equipment Exercise tolerated well No report of concerns or symptoms today Strength training completed today  Goals Unmet:  Not Applicable  Comments: Pt able to follow exercise prescription today without complaint.  Will continue to monitor for progression.    Dr. Bethann Punches is Medical Director for [facility] Cardiac Rehabilitation.  Dr. Vida Rigger is Medical Director for [facility] Pulmonary Rehabilitation.

## 2024-02-06 ENCOUNTER — Encounter: Payer: Medicare Other | Admitting: *Deleted

## 2024-02-06 DIAGNOSIS — I5032 Chronic diastolic (congestive) heart failure: Secondary | ICD-10-CM

## 2024-02-06 NOTE — Progress Notes (Signed)
Daily Session Note  Patient Details  Name: Elizabeth [name] MRN: [id number] Date of Birth: [birth year] Referring Provider:   Flowsheet Row Pulmonary Rehab from 12/02/2023 in [facility] Cardiac and Pulmonary Rehab  Referring Provider Dr. Dale Eleele       Encounter Date: 02/06/2024  Check In:  Session Check In - 02/06/24 1354       Check-In   Supervising physician immediately available to respond to emergencies See telemetry face sheet for immediately available ER MD    Location [name]    Staff Present Maxon Conetta BS, Exercise Physiologist;Noah Tickle, BS, Exercise Physiologist;Barnie Sopko Jewel Baize RN,BSN;Joseph Hood RCP,RRT,BSRT    Virtual Visit No    Medication changes reported     No    Fall or balance concerns reported    No    Warm-up and Cool-down Performed on first and last piece of equipment    Resistance Training Performed Yes    VAD Patient? No    PAD/SET Patient? No      Pain Assessment   Currently in Pain? No/denies                Social History   Tobacco Use  Smoking Status Never  Smokeless Tobacco Never    Goals Met:  Independence with exercise equipment Exercise tolerated well No report of concerns or symptoms today Strength training completed today  Goals Unmet:  Not Applicable  Comments: Pt able to follow exercise prescription today without complaint.  Will continue to monitor for progression.    Dr. Bethann Punches is Medical Director for [facility] Cardiac Rehabilitation.  Dr. Vida Rigger is Medical Director for [facility] Pulmonary Rehabilitation.

## 2024-02-10 ENCOUNTER — Encounter: Payer: Medicare Other | Admitting: *Deleted

## 2024-02-10 DIAGNOSIS — I5032 Chronic diastolic (congestive) heart failure: Secondary | ICD-10-CM | POA: Diagnosis not present

## 2024-02-10 NOTE — Progress Notes (Signed)
Daily Session Note  Patient Details  Name: Elizabeth [name] MRN: [id number] Date of Birth: 06/27/1927 Referring Provider:   Flowsheet Row Pulmonary Rehab from 12/02/2023 in [facility] Cardiac and Pulmonary Rehab  Referring Provider Dr. Dale Heath       Encounter Date: 02/10/2024  Check In:  Session Check In - 02/10/24 1406       Check-In   Supervising physician immediately available to respond to emergencies See telemetry face sheet for immediately available ER MD    Location [name]    Staff Present Susann Givens RN,BSN;Joseph [facility] BS, Exercise Physiologist;Noah Tickle, BS, Exercise Physiologist    Virtual Visit No    Medication changes reported     No    Fall or balance concerns reported    No    Warm-up and Cool-down Performed on first and last piece of equipment    Resistance Training Performed Yes    VAD Patient? No    PAD/SET Patient? No      Pain Assessment   Currently in Pain? No/denies                Social History   Tobacco Use  Smoking Status Never  Smokeless Tobacco Never    Goals Met:  Independence with exercise equipment Exercise tolerated well No report of concerns or symptoms today Strength training completed today  Goals Unmet:  Not Applicable  Comments: Pt able to follow exercise prescription today without complaint.  Will continue to monitor for progression.    Dr. Bethann Punches is Medical Director for [facility] Cardiac Rehabilitation.  Dr. Vida Rigger is Medical Director for [facility] Pulmonary Rehabilitation.

## 2024-02-13 ENCOUNTER — Telehealth: Payer: Self-pay | Admitting: Internal Medicine

## 2024-02-13 DIAGNOSIS — E7800 Pure hypercholesterolemia, unspecified: Secondary | ICD-10-CM

## 2024-02-13 DIAGNOSIS — E1165 Type 2 diabetes mellitus with hyperglycemia: Secondary | ICD-10-CM

## 2024-02-13 NOTE — Telephone Encounter (Signed)
 Patient need lab orders.

## 2024-02-13 NOTE — Telephone Encounter (Signed)
 Labs ordered.

## 2024-02-17 ENCOUNTER — Encounter: Payer: Medicare Other | Admitting: *Deleted

## 2024-02-17 DIAGNOSIS — I5032 Chronic diastolic (congestive) heart failure: Secondary | ICD-10-CM

## 2024-02-17 NOTE — Progress Notes (Signed)
 Daily Session Note  Patient Details  Name: Elizabeth [name] MRN: [id number] Date of Birth: August 29, 1927 Referring Provider:   Flowsheet Row Pulmonary Rehab from 12/02/2023 in [name] Cardiac and Pulmonary Rehab  Referring Provider Dr. Dale Omar       Encounter Date: 02/17/2024  Check In:  Session Check In - 02/17/24 1355       Check-In   Supervising physician immediately available to respond to emergencies See telemetry face sheet for immediately available ER MD    Location [name]    Staff Present Susann Givens RN,BSN;Maxon Conetta BS, Exercise Physiologist;Noah Tickle, BS, Exercise Physiologist    Virtual Visit No    Medication changes reported     No    Fall or balance concerns reported    No    Warm-up and Cool-down Performed on first and last piece of equipment    Resistance Training Performed Yes    VAD Patient? No    PAD/SET Patient? No      Pain Assessment   Currently in Pain? No/denies                Social History   Tobacco Use  Smoking Status Never  Smokeless Tobacco Never    Goals Met:  Independence with exercise equipment Exercise tolerated well No report of concerns or symptoms today Strength training completed today  Goals Unmet:  Not Applicable  Comments: Pt able to follow exercise prescription today without complaint.  Will continue to monitor for progression.    Dr. Bethann Punches is Medical Director for [name] Cardiac Rehabilitation.  Dr. Vida Rigger is Medical Director for [name] Pulmonary Rehabilitation.

## 2024-02-19 ENCOUNTER — Encounter: Payer: Medicare Other | Admitting: *Deleted

## 2024-02-19 DIAGNOSIS — I5032 Chronic diastolic (congestive) heart failure: Secondary | ICD-10-CM

## 2024-02-19 NOTE — Progress Notes (Signed)
 Daily Session Note  Patient Details  Name: Elizabeth [name] MRN: [id number] Date of Birth: 1927/11/16 Referring Provider:   Flowsheet Row Pulmonary Rehab from 12/02/2023 in [facility] Cardiac and Pulmonary Rehab  Referring Provider Dr. Dale Rugby       Encounter Date: 02/19/2024  Check In:  Session Check In - 02/19/24 1400       Check-In   Supervising physician immediately available to respond to emergencies See telemetry face sheet for immediately available ER MD    Location [name]    Staff Present Susann Givens RN,BSN;Maxon Conetta BS, Exercise Physiologist;Noah Tickle, BS, Exercise Physiologist;Margaret Best, MS, Exercise Physiologist    Virtual Visit No    Medication changes reported     No    Fall or balance concerns reported    No    Warm-up and Cool-down Performed on first and last piece of equipment    Resistance Training Performed Yes    VAD Patient? No    PAD/SET Patient? No      Pain Assessment   Currently in Pain? No/denies                Social History   Tobacco Use  Smoking Status Never  Smokeless Tobacco Never    Goals Met:  Independence with exercise equipment Exercise tolerated well No report of concerns or symptoms today Strength training completed today  Goals Unmet:  Not Applicable  Comments: Pt able to follow exercise prescription today without complaint.  Will continue to monitor for progression.    Dr. Bethann Punches is Medical Director for [facility] Cardiac Rehabilitation.  Dr. Vida Rigger is Medical Director for [facility] Pulmonary Rehabilitation.

## 2024-02-20 ENCOUNTER — Other Ambulatory Visit (INDEPENDENT_AMBULATORY_CARE_PROVIDER_SITE_OTHER): Payer: Medicare Other

## 2024-02-20 ENCOUNTER — Encounter: Payer: Medicare Other | Admitting: *Deleted

## 2024-02-20 DIAGNOSIS — E7800 Pure hypercholesterolemia, unspecified: Secondary | ICD-10-CM

## 2024-02-20 DIAGNOSIS — E1165 Type 2 diabetes mellitus with hyperglycemia: Secondary | ICD-10-CM

## 2024-02-20 DIAGNOSIS — I5032 Chronic diastolic (congestive) heart failure: Secondary | ICD-10-CM | POA: Diagnosis not present

## 2024-02-20 LAB — CBC WITH DIFFERENTIAL/PLATELET
Basophils Absolute: 0.1 10*3/uL (ref 0.0–0.1)
Basophils Relative: 0.7 % (ref 0.0–3.0)
Eosinophils Absolute: 0.2 10*3/uL (ref 0.0–0.7)
Eosinophils Relative: 3 % (ref 0.0–5.0)
HCT: 44.8 % (ref 36.0–46.0)
Hemoglobin: 14.5 g/dL (ref 12.0–15.0)
Lymphocytes Relative: 34.5 % (ref 12.0–46.0)
Lymphs Abs: 2.7 10*3/uL (ref 0.7–4.0)
MCHC: 32.4 g/dL (ref 30.0–36.0)
MCV: 94 fL (ref 78.0–100.0)
Monocytes Absolute: 0.6 10*3/uL (ref 0.1–1.0)
Monocytes Relative: 7.6 % (ref 3.0–12.0)
Neutro Abs: 4.2 10*3/uL (ref 1.4–7.7)
Neutrophils Relative %: 54.2 % (ref 43.0–77.0)
Platelets: 247 10*3/uL (ref 150.0–400.0)
RBC: 4.76 Mil/uL (ref 3.87–5.11)
RDW: 14 % (ref 11.5–15.5)
WBC: 7.8 10*3/uL (ref 4.0–10.5)

## 2024-02-20 LAB — LIPID PANEL
Cholesterol: 155 mg/dL (ref 0–200)
HDL: 47.4 mg/dL (ref >39.00)
LDL Cholesterol: 84 mg/dL (ref 0–99)
NonHDL: 107.81
Total CHOL/HDL Ratio: 3
Triglycerides: 119 mg/dL (ref 0.0–149.0)
VLDL: 23.8 mg/dL (ref 0.0–40.0)

## 2024-02-20 LAB — HEPATIC FUNCTION PANEL
ALT: 14 U/L (ref 0–35)
AST: 18 U/L (ref 0–37)
Albumin: 4.3 g/dL (ref 3.5–5.2)
Alkaline Phosphatase: 59 U/L (ref 39–117)
Bilirubin, Direct: 0.1 mg/dL (ref 0.0–0.3)
Total Bilirubin: 0.5 mg/dL (ref 0.2–1.2)
Total Protein: 6.9 g/dL (ref 6.0–8.3)

## 2024-02-20 LAB — BASIC METABOLIC PANEL
BUN: 32 mg/dL — ABNORMAL HIGH (ref 6–23)
CO2: 23 meq/L (ref 19–32)
Calcium: 9.2 mg/dL (ref 8.4–10.5)
Chloride: 107 meq/L (ref 96–112)
Creatinine, Ser: 1.34 mg/dL — ABNORMAL HIGH (ref 0.40–1.20)
GFR: 33.81 mL/min — ABNORMAL LOW (ref >60.00)
Glucose, Bld: 102 mg/dL — ABNORMAL HIGH (ref 70–99)
Potassium: 4.6 meq/L (ref 3.5–5.1)
Sodium: 138 meq/L (ref 135–145)

## 2024-02-20 LAB — HEMOGLOBIN A1C: Hgb A1c MFr Bld: 6.9 % — ABNORMAL HIGH (ref 4.6–6.5)

## 2024-02-20 NOTE — Progress Notes (Signed)
 Daily Session Note  Patient Details  Name: Elizabeth [name] MRN: [id number] Date of Birth: 11/02/1927 Referring Provider:   Flowsheet Row Pulmonary Rehab from 12/02/2023 in [facility] Cardiac and Pulmonary Rehab  Referring Provider Dr. Dale Batavia       Encounter Date: 02/20/2024  Check In:  Session Check In - 02/20/24 1348       Check-In   Supervising physician immediately available to respond to emergencies See telemetry face sheet for immediately available ER MD    Location [name]    Staff Present Susann Givens RN,BSN;Joseph Weyman Pedro, [place], Exercise Physiologist    Virtual Visit No    Medication changes reported     No    Fall or balance concerns reported    No    Warm-up and Cool-down Performed on first and last piece of equipment    Resistance Training Performed Yes    VAD Patient? No    PAD/SET Patient? No      Pain Assessment   Currently in Pain? No/denies                Social History   Tobacco Use  Smoking Status Never  Smokeless Tobacco Never    Goals Met:  Independence with exercise equipment Exercise tolerated well No report of concerns or symptoms today Strength training completed today  Goals Unmet:  Not Applicable  Comments: Pt able to follow exercise prescription today without complaint.  Will continue to monitor for progression.    Dr. Bethann Punches is Medical Director for [facility] Cardiac Rehabilitation.  Dr. Vida Rigger is Medical Director for [facility] Pulmonary Rehabilitation.

## 2024-02-24 ENCOUNTER — Ambulatory Visit (INDEPENDENT_AMBULATORY_CARE_PROVIDER_SITE_OTHER): Payer: Medicare Other | Admitting: Internal Medicine

## 2024-02-24 VITALS — BP 120/70 | HR 76 | Temp 98.2°F | Resp 16 | Ht 61.5 in | Wt 143.0 lb

## 2024-02-24 DIAGNOSIS — E7800 Pure hypercholesterolemia, unspecified: Secondary | ICD-10-CM

## 2024-02-24 DIAGNOSIS — I471 Supraventricular tachycardia, unspecified: Secondary | ICD-10-CM | POA: Diagnosis not present

## 2024-02-24 DIAGNOSIS — Z0000 Encounter for general adult medical examination without abnormal findings: Secondary | ICD-10-CM | POA: Diagnosis not present

## 2024-02-24 DIAGNOSIS — N1831 Chronic kidney disease, stage 3a: Secondary | ICD-10-CM

## 2024-02-24 DIAGNOSIS — E1165 Type 2 diabetes mellitus with hyperglycemia: Secondary | ICD-10-CM

## 2024-02-24 DIAGNOSIS — F439 Reaction to severe stress, unspecified: Secondary | ICD-10-CM | POA: Diagnosis not present

## 2024-02-24 DIAGNOSIS — I5189 Other ill-defined heart diseases: Secondary | ICD-10-CM

## 2024-02-24 DIAGNOSIS — I10 Essential (primary) hypertension: Secondary | ICD-10-CM

## 2024-02-24 DIAGNOSIS — I700 Atherosclerosis of aorta: Secondary | ICD-10-CM

## 2024-02-24 NOTE — Progress Notes (Signed)
 Subjective:    Patient ID: Elizabeth [name], female    DOB: 08/01/1927, 89 y.o.   MRN: [id number]  Patient here for  Chief Complaint  Patient presents with   Annual Exam    HPI Here for a physical exam. evaluated by cardiology 08/12/23. Blood pressure controlled - at that vsiit. No changes made. Continues on amlodipine 2.5mg  (added recently), metoprolol and lisinopril. Trying to stay active. Going to cardiac rehab. Walking. No chest pain reported. Breathing stable.    Past Medical History:  Diagnosis Date   Cystocele or rectocele with incomplete uterine prolapse    Hyperlipidemia    Hypertension    Irritable bowel syndrome    Palpitations    Vaginal atrophy    Past Surgical History:  Procedure Laterality Date   CATARACT EXTRACTION  2006   Family History  Problem Relation Age of Onset   Transient ischemic attack Mother        multiple    Dementia Mother    Stroke Father    Heart disease Brother        MI - died age 50   Leukemia Brother    Lung cancer Brother    Throat cancer Brother        had heart disease also   Heart disease Sister    Dementia Sister    Social History   Socioeconomic History   Marital status: Widowed    Spouse name: Not on file   Number of children: 2   Years of education: Not on file   Highest education level: Not on file  Occupational History   Not on file  Tobacco Use   Smoking status: Never   Smokeless tobacco: Never  Vaping Use   Vaping status: Never Used  Substance and Sexual Activity   Alcohol use: No    Alcohol/week: 0.0 standard drinks of alcohol   Drug use: No   Sexual activity: Not Currently    Birth control/protection: None  Other Topics Concern   Not on file  Social History Narrative   Not on file   Social Drivers of Health   Financial Resource Strain: Low Risk  (10/02/2022)   Overall Financial Resource Strain (CARDIA)    Difficulty of Paying Living Expenses: Not hard at all  Food Insecurity: No Food Insecurity  (10/02/2022)   Hunger Vital Sign    Worried About Running Out of Food in the Last Year: Never true    Ran Out of Food in the Last Year: Never true  Transportation Needs: No Transportation Needs (10/02/2022)   PRAPARE - Administrator, Civil Service (Medical): No    Lack of Transportation (Non-Medical): No  Physical Activity: Insufficiently Active (10/02/2022)   Exercise Vital Sign    Days of Exercise per Week: 7 days    Minutes of Exercise per Session: 10 min  Stress: No Stress Concern Present (10/02/2022)   [employer] of Occupational Health - Occupational Stress Questionnaire    Feeling of Stress : Not at all  Social Connections: Unknown (10/02/2022)   Social Connection and Isolation Panel [NHANES]    Frequency of Communication with Friends and Family: More than three times a week    Frequency of Social Gatherings with Friends and Family: Not on file    Attends Religious Services: Not on file    Active Member of Clubs or Organizations: Not on file    Attends Banker Meetings: Not on file    Marital Status:  Widowed     Review of Systems  Constitutional:  Negative for appetite change and unexpected weight change.  HENT:  Negative for congestion, sinus pressure and sore throat.   Eyes:  Negative for pain and visual disturbance.  Respiratory:  Negative for cough, chest tightness and shortness of breath.   Cardiovascular:  Negative for chest pain, palpitations and leg swelling.  Gastrointestinal:  Negative for abdominal pain, constipation and diarrhea.  Genitourinary:  Negative for difficulty urinating and dysuria.  Musculoskeletal:  Negative for back pain, gait problem, joint swelling and myalgias.  Skin:  Negative for color change and rash.  Neurological:  Negative for dizziness and headaches.  Hematological:  Negative for adenopathy. Does not bruise/bleed easily.  Psychiatric/Behavioral:  Negative for agitation and dysphoric mood.         Objective:     BP 120/70   Pulse 76   Temp 98.2 F (36.8 C) (Oral)   Resp 16   Ht 5' 1.5" (1.562 m)   Wt 143 lb (64.9 kg)   SpO2 98%   BMI 26.58 kg/m  Wt Readings from Last 3 Encounters:  02/24/24 143 lb (64.9 kg)  12/02/23 144 lb 4.8 oz (65.5 kg)  10/25/23 142 lb 12.8 oz (64.8 kg)    Physical Exam Vitals reviewed.  Constitutional:      General: She is not in acute distress.    Appearance: Normal appearance. She is well-developed.  HENT:     Head: Normocephalic and atraumatic.     Right Ear: External ear normal.     Left Ear: External ear normal.     Mouth/Throat:     Pharynx: No oropharyngeal exudate or posterior oropharyngeal erythema.  Eyes:     General: No scleral icterus.       Right eye: No discharge.        Left eye: No discharge.     Conjunctiva/sclera: Conjunctivae normal.  Neck:     Thyroid: No thyromegaly.  Cardiovascular:     Rate and Rhythm: Normal rate and regular rhythm.  Pulmonary:     Effort: No tachypnea, accessory muscle usage or respiratory distress.     Breath sounds: Normal breath sounds. No decreased breath sounds or wheezing.  Chest:  Breasts:    Right: No inverted nipple, mass, nipple discharge or tenderness (no axillary adenopathy).     Left: No inverted nipple, mass, nipple discharge or tenderness (no axilarry adenopathy).  Abdominal:     General: Bowel sounds are normal.     Palpations: Abdomen is soft.     Tenderness: There is no abdominal tenderness.  Musculoskeletal:        General: No swelling or tenderness.     Cervical back: Neck supple. No tenderness.  Lymphadenopathy:     Cervical: No cervical adenopathy.  Skin:    Findings: No erythema or rash.  Neurological:     Mental Status: She is alert and oriented to person, place, and time.  Psychiatric:        Mood and Affect: Mood normal.        Behavior: Behavior normal.         Outpatient Encounter Medications as of 02/24/2024  Medication Sig   amLODipine (NORVASC)  2.5 MG tablet Take 1 tablet (2.5 mg total) by mouth daily.   aspirin 81 MG tablet Take 81 mg by mouth daily.   atorvastatin (LIPITOR) 40 MG tablet Take 1 tablet (40 mg total) by mouth daily.   cetirizine (ZYRTEC) 5 MG tablet  Take 5 mg by mouth daily as needed.    lisinopril (ZESTRIL) 40 MG tablet Take 1 tablet (40 mg total) by mouth daily.   meclizine (ANTIVERT) 12.5 MG tablet Take 2 tablets (25 mg total) by mouth 3 (three) times daily as needed for dizziness.   metoprolol tartrate (LOPRESSOR) 25 MG tablet Take 1 tablet (25 mg total) by mouth 2 (two) times daily.   OXYQUINOLONE SULFATE VAGINAL (TRIMO-SAN) 0.025 % GEL Place 1 Applicatorful vaginally once a week.   triamcinolone (NASACORT) 55 MCG/ACT nasal inhaler Place 2 sprays into the nose daily as needed.    No facility-administered encounter medications on file as of 02/24/2024.     Lab Results  Component Value Date   WBC 7.8 02/20/2024   HGB 14.5 02/20/2024   HCT 44.8 02/20/2024   PLT 247.0 02/20/2024   GLUCOSE 102 (H) 02/20/2024   CHOL 155 02/20/2024   TRIG 119.0 02/20/2024   HDL 47.40 02/20/2024   LDLDIRECT 201.0 03/07/2023   LDLCALC 84 02/20/2024   ALT 14 02/20/2024   AST 18 02/20/2024   NA 138 02/20/2024   K 4.6 02/20/2024   CL 107 02/20/2024   CREATININE 1.34 (H) 02/20/2024   BUN 32 (H) 02/20/2024   CO2 23 02/20/2024   TSH 3.26 06/20/2023   HGBA1C 6.9 (H) 02/20/2024   MICROALBUR 3.3 (H) 08/31/2022    MR BRAIN WO CONTRAST Result Date: 10/26/2021 CLINICAL DATA:  89 year old female with vertigo, nausea and vomiting for 2 days. EXAM: MRI HEAD WITHOUT CONTRAST TECHNIQUE: Multiplanar, multiecho pulse sequences of the brain and surrounding structures were obtained without intravenous contrast. COMPARISON:  Head CT without contrast 2358 hours last night. Brain MRI 05/01/2021. FINDINGS: Brain: No restricted diffusion to suggest acute infarction. No midline shift, mass effect, evidence of mass lesion, ventriculomegaly, extra-axial  collection or acute intracranial hemorrhage. Cervicomedullary junction and pituitary are within normal limits. Scattered and patchy but overall mild for age bilateral cerebral white matter T2 and FLAIR hyperintensity is stable since May. No cortical encephalomalacia identified. No definite chronic cerebral blood products. Deep gray matter nuclei, brainstem and cerebellum remain normal for age. Vascular: Major intracranial vascular flow voids are stable. Skull and upper cervical spine: Negative. Sinuses/Orbits: Stable, negative. Other: Mastoids remain clear. Stable and grossly normal visible internal auditory structures. Stylomastoid foramina and parotid glands appear normal. IMPRESSION: 1. No acute intracranial abnormality. 2. Stable since May and largely normal for age noncontrast MRI appearance of the brain. Electronically Signed   By: Odessa Fleming M.D.   On: 10/26/2021 05:17   CT Head Wo Contrast Result Date: 10/26/2021 CLINICAL DATA:  Dizziness. EXAM: CT HEAD WITHOUT CONTRAST TECHNIQUE: Contiguous axial images were obtained from the base of the skull through the vertex without intravenous contrast. COMPARISON:  None. FINDINGS: Brain: There is mild cerebral atrophy with widening of the extra-axial spaces and ventricular dilatation. There are areas of decreased attenuation within the white matter tracts of the supratentorial brain, consistent with microvascular disease changes. Vascular: No hyperdense vessel or unexpected calcification. Skull: Normal. Negative for fracture or focal lesion. Sinuses/Orbits: No acute finding. Other: None. IMPRESSION: 1. Generalized cerebral atrophy. 2. No acute intracranial abnormality. Electronically Signed   By: Aram Candela M.D.   On: 10/26/2021 00:09       Assessment & Plan:  Routine general medical examination at a health care facility  Type 2 diabetes mellitus with hyperglycemia, without long-term current use of insulin (HCC) Assessment & Plan: Low carb diet and  exercise. Follow met  b and A1c.   Lab Results  Component Value Date   HGBA1C 6.9 (H) 02/20/2024      SVT (supraventricular tachycardia) (HCC) Assessment & Plan: Followed by cardiology. Continue lopressor. Stable. Follow.    Stress Assessment & Plan: Increased stress. Discussed. Will notify me if feels needs further intervention. Follow.    Primary hypertension Assessment & Plan: Continue lisinopril, amlodipine and metoprolol. Blood pressure as outlined. Hold on making changes in medication. Follow pressures. Follow metabolic panel.    Hypercholesterolemia Assessment & Plan: Low cholesterol diet and exercise. Follow lipid panel and liver function tests.  Continue lipitor.    Health care maintenance Assessment & Plan: Physical today 02/24/24.  Declines mammogram.    Aortic atherosclerosis (HCC) Assessment & Plan: Continue lipitor and aspirin.    Stage 3a chronic kidney disease (HCC) Assessment & Plan: Avoid antiinflammatory medication. Stay hydrated. Follow metabolic panel.    Diastolic dysfunction Assessment & Plan: ECHO with diastolic dysfunction. History of SVT. Continue cardiac rehab.       Dale Mount Wolf, MD

## 2024-02-26 ENCOUNTER — Encounter: Payer: Medicare Other | Attending: Internal Medicine | Admitting: *Deleted

## 2024-02-26 DIAGNOSIS — I5032 Chronic diastolic (congestive) heart failure: Secondary | ICD-10-CM | POA: Insufficient documentation

## 2024-02-26 NOTE — Progress Notes (Signed)
 Daily Session Note  Patient Details  Name: Elizabeth [name] MRN: [id number] Date of Birth: 1927-09-21 Referring Provider:   Flowsheet Row Pulmonary Rehab from 12/02/2023 in [facility] Cardiac and Pulmonary Rehab  Referring Provider Dr. Dale T[place]au       Encounter Date: 02/26/2024  Check In:  Session Check In - 02/26/24 1400       Check-In   Supervising physician immediately available to respond to emergencies See telemetry face sheet for immediately available ER MD    Location [name]    Staff Present Susann Givens RN,BSN;Kelly Madilyn Fireman BS, ACSM CEP, Exercise Physiologist;Noah Tickle, BS, Exercise Physiologist    Virtual Visit No    Medication changes reported     No    Fall or balance concerns reported    No    Warm-up and Cool-down Performed on first and last piece of equipment    Resistance Training Performed Yes    VAD Patient? No    PAD/SET Patient? No      Pain Assessment   Currently in Pain? No/denies                Social History   Tobacco Use  Smoking Status Never  Smokeless Tobacco Never    Goals Met:  Independence with exercise equipment Exercise tolerated well No report of concerns or symptoms today Strength training completed today  Goals Unmet:  Not Applicable  Comments: Pt able to follow exercise prescription today without complaint.  Will continue to monitor for progression.    Dr. Bethann Punches is Medical Director for [facility] Cardiac Rehabilitation.  Dr. Vida Rigger is Medical Director for [facility] Pulmonary Rehabilitation.

## 2024-02-27 ENCOUNTER — Encounter: Payer: Medicare Other | Admitting: *Deleted

## 2024-02-27 DIAGNOSIS — I5032 Chronic diastolic (congestive) heart failure: Secondary | ICD-10-CM

## 2024-02-27 NOTE — Progress Notes (Signed)
 Daily Session Note  Patient Details  Name: Elizabeth [name] MRN: [id number] Date of Birth: 07-23-1927 Referring Provider:   Flowsheet Row Pulmonary Rehab from 12/02/2023 in [facility] Cardiac and Pulmonary Rehab  Referring Provider Dr. Dale Wallace       Encounter Date: 02/27/2024  Check In:  Session Check In - 02/27/24 1404       Check-In   Supervising physician immediately available to respond to emergencies See telemetry face sheet for immediately available ER MD    Location [name]    Staff Present Elige Ko RCP,RRT,BSRT;Emmie Frakes Jewel Baize RN,BSN;Noah Tickle, BS, Exercise Physiologist;Margaret Best, MS, Exercise Physiologist    Virtual Visit No    Medication changes reported     No    Fall or balance concerns reported    No    Warm-up and Cool-down Performed on first and last piece of equipment    Resistance Training Performed Yes    VAD Patient? No    PAD/SET Patient? No      Pain Assessment   Currently in Pain? No/denies                Social History   Tobacco Use  Smoking Status Never  Smokeless Tobacco Never    Goals Met:  Independence with exercise equipment Exercise tolerated well No report of concerns or symptoms today Strength training completed today  Goals Unmet:  Not Applicable  Comments: Pt able to follow exercise prescription today without complaint.  Will continue to monitor for progression.    Dr. Bethann Punches is Medical Director for [facility] Cardiac Rehabilitation.  Dr. Vida Rigger is Medical Director for [facility] Pulmonary Rehabilitation.

## 2024-02-29 ENCOUNTER — Encounter: Payer: Self-pay | Admitting: Internal Medicine

## 2024-02-29 NOTE — Assessment & Plan Note (Signed)
Increased stress.  Discussed.  Will notify me if feels needs further intervention.  Follow.

## 2024-02-29 NOTE — Assessment & Plan Note (Signed)
 ECHO with diastolic dysfunction. History of SVT. Continue cardiac rehab.

## 2024-02-29 NOTE — Assessment & Plan Note (Signed)
 Continue lisinopril, amlodipine and metoprolol. Blood pressure as outlined. Hold on making changes in medication. Follow pressures. Follow metabolic panel.

## 2024-02-29 NOTE — Assessment & Plan Note (Signed)
 Followed by cardiology. Continue lopressor. Stable. Follow.

## 2024-02-29 NOTE — Assessment & Plan Note (Signed)
Continue lipitor and aspirin.  ?

## 2024-02-29 NOTE — Assessment & Plan Note (Signed)
Low cholesterol diet and exercise.  Follow lipid panel and liver function tests.  Continue lipitor.

## 2024-02-29 NOTE — Assessment & Plan Note (Signed)
Avoid antiinflammatory medication.  Stay hydrated.  Follow metabolic panel.

## 2024-02-29 NOTE — Assessment & Plan Note (Signed)
 Physical today 02/24/24.  Declines mammogram.

## 2024-02-29 NOTE — Assessment & Plan Note (Signed)
 Low carb diet and exercise. Follow met b and A1c.   Lab Results  Component Value Date   HGBA1C 6.9 (H) 02/20/2024

## 2024-03-02 ENCOUNTER — Encounter: Payer: Medicare Other | Admitting: *Deleted

## 2024-03-02 DIAGNOSIS — I5032 Chronic diastolic (congestive) heart failure: Secondary | ICD-10-CM | POA: Diagnosis not present

## 2024-03-02 NOTE — Progress Notes (Signed)
 Daily Session Note  Patient Details  Name: Elizabeth [name] MRN: [id number] Date of Birth: [birth year] Referring Provider:   Flowsheet Row Pulmonary Rehab from 12/02/2023 in [facility] Cardiac and Pulmonary Rehab  Referring Provider Dr. Dale Markham       Encounter Date: 03/02/2024  Check In:  Session Check In - 03/02/24 1402       Check-In   Supervising physician immediately available to respond to emergencies See telemetry face sheet for immediately available ER MD    Location [name]    Staff Present Elige Ko RCP,RRT,BSRT;Keyira Mondesir Jewel Baize RN,BSN;Noah Tickle, BS, Exercise Physiologist    Virtual Visit No    Medication changes reported     No    Fall or balance concerns reported    No    Warm-up and Cool-down Performed on first and last piece of equipment    Resistance Training Performed Yes    VAD Patient? No    PAD/SET Patient? No      Pain Assessment   Currently in Pain? No/denies                Social History   Tobacco Use  Smoking Status Never  Smokeless Tobacco Never    Goals Met:  Independence with exercise equipment Exercise tolerated well No report of concerns or symptoms today Strength training completed today  Goals Unmet:  Not Applicable  Comments: Pt able to follow exercise prescription today without complaint.  Will continue to monitor for progression.    Dr. Bethann Punches is Medical Director for [facility] Cardiac Rehabilitation.  Dr. Vida Rigger is Medical Director for [facility] Pulmonary Rehabilitation.

## 2024-03-04 ENCOUNTER — Encounter: Payer: Medicare Other | Admitting: *Deleted

## 2024-03-04 VITALS — Ht 61.5 in | Wt 143.6 lb

## 2024-03-04 DIAGNOSIS — I5032 Chronic diastolic (congestive) heart failure: Secondary | ICD-10-CM | POA: Diagnosis not present

## 2024-03-04 NOTE — Progress Notes (Signed)
 Daily Session Note  Patient Details  Name: Elizabeth [name] MRN: [id number] Date of Birth: 1927/10/20 Referring Provider:   Flowsheet Row Pulmonary Rehab from 12/02/2023 in [facility] Cardiac and Pulmonary Rehab  Referring Provider Dr. Dale Comanche       Encounter Date: 03/04/2024  Check In:  Session Check In - 03/04/24 1359       Check-In   Supervising physician immediately available to respond to emergencies See telemetry face sheet for immediately available ER MD    Location [name]    Staff Present Susann Givens RN,BSN;Susanne Bice, RN, BSN, CCRP;Noah Tickle, BS, Exercise Physiologist;Kelly Cloretta Ned, ACSM CEP, Exercise Physiologist    Virtual Visit No    Medication changes reported     No    Fall or balance concerns reported    No    Warm-up and Cool-down Performed on first and last piece of equipment    Resistance Training Performed Yes    VAD Patient? No    PAD/SET Patient? No      Pain Assessment   Currently in Pain? No/denies                Social History   Tobacco Use  Smoking Status Never  Smokeless Tobacco Never    Goals Met:  Independence with exercise equipment Exercise tolerated well No report of concerns or symptoms today Strength training completed today  Goals Unmet:  Not Applicable  Comments: Pt able to follow exercise prescription today without complaint.  Will continue to monitor for progression.    Dr. Bethann Punches is Medical Director for [facility] Cardiac Rehabilitation.  Dr. Vida Rigger is Medical Director for [facility] Pulmonary Rehabilitation.

## 2024-03-04 NOTE — Patient Instructions (Signed)
 Discharge Patient Instructions  Patient Details  Name: Elizabeth [name] MRN: [id number] Date of Birth: 22-May-1927 Referring Provider:  Dale [place], MD   Number of Visits: 73  Reason for Discharge:  Patient reached a stable level of exercise. Patient independent in their exercise. Patient has met program and personal goals.  Diagnosis:  Heart failure, diastolic, chronic (HCC)  Initial Exercise Prescription:  Initial Exercise Prescription - 12/02/23 1500       Date of Initial Exercise RX and Referring Provider   Date 12/02/23    Referring Provider Dr. Dale Wellington      Oxygen   Maintain Oxygen Saturation 88% or higher      Treadmill   MPH 2.5    Grade 0    Minutes 15    METs 2.91      Recumbant Bike   Level 2    RPM 50    Watts 25    Minutes 15    METs 1.97      NuStep   Level 2    SPM 80    Minutes 15    METs 1.97      REL-XR   Level 2    Watts 25    Speed 50    Minutes 15    METs 1.97      T5 Nustep   Level 2    SPM 80    Minutes 15    METs 1.97      Track   Laps 19    Minutes 15    METs 2.09      Intensity   THRR 40-80% of Max Heartrate 92-114    Ratings of Perceived Exertion 11-13    Perceived Dyspnea 0-4      Progression   Progression Continue to progress workloads to maintain intensity without signs/symptoms of physical distress.      Resistance Training   Training Prescription Yes    Weight 3 lb    Reps 10-15             Discharge Exercise Prescription (Final Exercise Prescription Changes):  Exercise Prescription Changes - 02/20/24 1200       Response to Exercise   Blood Pressure (Admit) 128/64    Blood Pressure (Exit) 126/64    Heart Rate (Admit) 66 bpm    Heart Rate (Exercise) 92 bpm    Heart Rate (Exit) 71 bpm    Oxygen Saturation (Admit) 96 %    Oxygen Saturation (Exercise) 89 %    Oxygen Saturation (Exit) 94 %    Rating of Perceived Exertion (Exercise) 13    Perceived Dyspnea (Exercise) 1    Symptoms none     Duration Continue with 30 min of aerobic exercise without signs/symptoms of physical distress.    Intensity THRR unchanged      Progression   Progression Continue to progress workloads to maintain intensity without signs/symptoms of physical distress.    Average METs 4.27      Resistance Training   Training Prescription Yes    Weight 3 lb    Reps 10-15      Interval Training   Interval Training No      Track   Laps 60    Minutes 15    METs 4.27      Home Exercise Plan   Plans to continue exercise at Home (comment)   walk and do balance exercises   Frequency Add 2 additional days to program exercise sessions.  Initial Home Exercises Provided 01/13/24      Oxygen   Maintain Oxygen Saturation 88% or higher             Functional Capacity:  6 Minute Walk     Row Name 12/02/23 1514 03/04/24 1410       6 Minute Walk   Phase Initial Discharge    Distance 1420 feet 1425 feet    Distance % Change -- 0.3 %    Distance Feet Change -- 5 ft    Walk Time 6 minutes 6 minutes    # of Rest Breaks 0 0    MPH 2.7 2.7    METS 1.97 1.77    RPE 11 11    Perceived Dyspnea  3 1    VO2 Peak 6.9 6.21    Symptoms No Yes (comment)    Comments -- SOB    Resting HR 70 bpm 69 bpm    Resting BP 152/68 122/62    Resting Oxygen Saturation  96 % 98 %    Exercise Oxygen Saturation  during 6 min walk 93 % 94 %    Max Ex. HR 96 bpm 101 bpm    Max Ex. BP 172/68 142/64    2 Minute Post BP 146/70 116/62      Interval HR   1 Minute HR 87 86    2 Minute HR 86 89    3 Minute HR 91 89    4 Minute HR 95 95    5 Minute HR 96 99    6 Minute HR 71 101    2 Minute Post HR 78 81    Interval Heart Rate? Yes Yes      Interval Oxygen   Interval Oxygen? Yes Yes    Baseline Oxygen Saturation % 96 % 98 %    1 Minute Oxygen Saturation % 95 % 94 %    1 Minute Liters of Oxygen -- 0 L  RA    2 Minute Oxygen Saturation % 94 % 94 %    2 Minute Liters of Oxygen -- 0 L    3 Minute Oxygen  Saturation % 93 % 94 %    3 Minute Liters of Oxygen -- 0 L    4 Minute Oxygen Saturation % 94 % 95 %    4 Minute Liters of Oxygen -- 0 L    5 Minute Oxygen Saturation % 94 % 95 %    5 Minute Liters of Oxygen -- 0 L    6 Minute Oxygen Saturation % 97 % 96 %    6 Minute Liters of Oxygen -- 0 L    2 Minute Post Oxygen Saturation % 97 % 98 %    2 Minute Post Liters of Oxygen -- 0 L            Nutrition & Weight - Outcomes:  Pre Biometrics - 12/02/23 1523       Pre Biometrics   Height 5' 1.5" (1.562 m)    Weight 144 lb 4.8 oz (65.5 kg)    Waist Circumference 34.5 inches    Hip Circumference 40.5 inches    Waist to Hip Ratio 0.85 %    BMI (Calculated) 26.83    Single Leg Stand 6.7 seconds             Post Biometrics - 03/04/24 1413        Post  Biometrics   Height 5' 1.5" (1.562 m)  Weight 143 lb 9.6 oz (65.1 kg)    Waist Circumference 33 inches    Hip Circumference 38 inches    Waist to Hip Ratio 0.87 %    BMI (Calculated) 26.7    Single Leg Stand 11.2 seconds

## 2024-03-05 ENCOUNTER — Encounter: Payer: Medicare Other | Admitting: *Deleted

## 2024-03-05 DIAGNOSIS — I5032 Chronic diastolic (congestive) heart failure: Secondary | ICD-10-CM

## 2024-03-05 NOTE — Progress Notes (Signed)
 Daily Session Note  Patient Details  Name: Elizabeth [name] MRN: [id number] Date of Birth: 1927/11/22 Referring Provider:   Flowsheet Row Pulmonary Rehab from 12/02/2023 in [facility] Cardiac and Pulmonary Rehab  Referring Provider Dr. Dale Corralitos       Encounter Date: 03/05/2024  Check In:  Session Check In - 03/05/24 1411       Check-In   Supervising physician immediately available to respond to emergencies See telemetry face sheet for immediately available ER MD    Location [name]    Staff Present Susann Givens RN,BSN;Joseph [facility] BS, Exercise Physiologist;Noah Tickle, BS, Exercise Physiologist    Virtual Visit No    Medication changes reported     No    Fall or balance concerns reported    No    Warm-up and Cool-down Performed on first and last piece of equipment    Resistance Training Performed Yes    VAD Patient? No    PAD/SET Patient? No      Pain Assessment   Currently in Pain? No/denies                Social History   Tobacco Use  Smoking Status Never  Smokeless Tobacco Never    Goals Met:  Independence with exercise equipment Exercise tolerated well No report of concerns or symptoms today Strength training completed today  Goals Unmet:  Not Applicable  Comments: Pt able to follow exercise prescription today without complaint.  Will continue to monitor for progression.    Dr. Bethann Punches is Medical Director for [facility] Cardiac Rehabilitation.  Dr. Vida Rigger is Medical Director for [facility] Pulmonary Rehabilitation.

## 2024-03-09 ENCOUNTER — Encounter: Payer: Medicare Other | Admitting: *Deleted

## 2024-03-09 DIAGNOSIS — I5032 Chronic diastolic (congestive) heart failure: Secondary | ICD-10-CM

## 2024-03-09 NOTE — Progress Notes (Signed)
 Daily Session Note  Patient Details  Name: Elizabeth [name] MRN: [id number] Date of Birth: 1927/10/14 Referring Provider:   Flowsheet Row Pulmonary Rehab from 12/02/2023 in [facility] Cardiac and Pulmonary Rehab  Referring Provider Dr. Dale Goehner       Encounter Date: 03/09/2024  Check In:  Session Check In - 03/09/24 1348       Check-In   Supervising physician immediately available to respond to emergencies See telemetry face sheet for immediately available ER MD    Location [name]    Staff Present Susann Givens RN,BSN;Joseph Casimer Lanius, [place], RRT, CPFT;Noah Tickle, BS, Exercise Physiologist    Virtual Visit No    Medication changes reported     No    Fall or balance concerns reported    No    Warm-up and Cool-down Performed on first and last piece of equipment    Resistance Training Performed Yes    VAD Patient? No    PAD/SET Patient? No      Pain Assessment   Currently in Pain? No/denies                Social History   Tobacco Use  Smoking Status Never  Smokeless Tobacco Never    Goals Met:  Independence with exercise equipment Exercise tolerated well No report of concerns or symptoms today Strength training completed today  Goals Unmet:  Not Applicable  Comments: Pt able to follow exercise prescription today without complaint.  Will continue to monitor for progression.    Dr. Bethann Punches is Medical Director for [facility] Cardiac Rehabilitation.  Dr. Vida Rigger is Medical Director for [facility] [place] [facility] Pulmonary Rehabilitation.

## 2024-03-11 ENCOUNTER — Encounter: Payer: Medicare Other | Admitting: *Deleted

## 2024-03-11 DIAGNOSIS — I5032 Chronic diastolic (congestive) heart failure: Secondary | ICD-10-CM | POA: Diagnosis not present

## 2024-03-11 NOTE — Progress Notes (Signed)
 Daily Session Note  Patient Details  Name: Elizabeth [name] MRN: [id number] Date of Birth: Jun 16, 1927 Referring Provider:   Flowsheet Row Pulmonary Rehab from 12/02/2023 in [facility] Cardiac and Pulmonary Rehab  Referring Provider Dr. Dale [place]       Encounter Date: 03/11/2024  Check In:  Session Check In - 03/11/24 1420       Check-In   Supervising physician immediately available to respond to emergencies See telemetry face sheet for immediately available ER MD    Location [name]    Staff Present Susann Givens RN,BSN;Margaret Best, MS, Exercise Physiologist;Maxon Conetta BS, Exercise Physiologist;Noah Tickle, BS, Exercise Physiologist;Susanne Bice, RN, BSN, CCRP    Virtual Visit No    Medication changes reported     No    Fall or balance concerns reported    No    Warm-up and Cool-down Performed on first and last piece of equipment    Resistance Training Performed Yes    VAD Patient? No    PAD/SET Patient? No      Pain Assessment   Currently in Pain? No/denies                Social History   Tobacco Use  Smoking Status Never  Smokeless Tobacco Never    Goals Met:  Independence with exercise equipment Exercise tolerated well No report of concerns or symptoms today Strength training completed today  Goals Unmet:  Not Applicable  Comments: Pt able to follow exercise prescription today without complaint.  Will continue to monitor for progression.    Dr. Bethann Punches is Medical Director for [facility] Cardiac Rehabilitation.  Dr. Vida Rigger is Medical Director for [facility] Pulmonary Rehabilitation.

## 2024-03-16 ENCOUNTER — Encounter: Payer: Medicare Other | Admitting: *Deleted

## 2024-03-16 DIAGNOSIS — I5032 Chronic diastolic (congestive) heart failure: Secondary | ICD-10-CM | POA: Diagnosis not present

## 2024-03-16 NOTE — Progress Notes (Signed)
 Daily Session Note  Patient Details  Name: Elizabeth [name] MRN: [id number] Date of Birth: Jan 24, 1927 Referring Provider:   Flowsheet Row Pulmonary Rehab from 12/02/2023 in [facility] Cardiac and Pulmonary Rehab  Referring Provider Dr. Dale [place]       Encounter Date: 03/16/2024  Check In:  Session Check In - 03/16/24 1350       Check-In   Supervising physician immediately available to respond to emergencies See telemetry face sheet for immediately available ER MD    Location [name]    Staff Present Susann Givens RN,BSN;Joseph Reino Kent RCP,RRT,BSRT;Noah Tickle, [place], Exercise Physiologist;Maxon Conetta BS, Exercise Physiologist    Virtual Visit No    Medication changes reported     No    Fall or balance concerns reported    No    Warm-up and Cool-down Performed on first and last piece of equipment    Resistance Training Performed Yes    VAD Patient? No    PAD/SET Patient? No      Pain Assessment   Currently in Pain? No/denies                Social History   Tobacco Use  Smoking Status Never  Smokeless Tobacco Never    Goals Met:  Independence with exercise equipment Exercise tolerated well No report of concerns or symptoms today Strength training completed today  Goals Unmet:  Not Applicable  Comments: Pt able to follow exercise prescription today without complaint.  Will continue to monitor for progression.    Dr. Bethann Punches is Medical Director for [facility] Cardiac Rehabilitation.  Dr. Vida Rigger is Medical Director for [facility] Pulmonary Rehabilitation.

## 2024-03-18 ENCOUNTER — Encounter: Payer: Medicare Other | Admitting: *Deleted

## 2024-03-18 DIAGNOSIS — I5032 Chronic diastolic (congestive) heart failure: Secondary | ICD-10-CM | POA: Diagnosis not present

## 2024-03-18 NOTE — Progress Notes (Signed)
 Discharge Summary   Elizabeth [name] DOB: 06/18/1927  Elizabeth [name] graduated today from  rehab with 36 sessions completed.  Details of the patient's exercise prescription and what She needs to do in order to continue the prescription and progress were discussed with patient.  Patient was given a copy of prescription and goals.  Patient verbalized understanding. Elizabeth [name] plans to continue to exercise by walking.   6 Minute Walk     Row Name 12/02/23 1514 03/04/24 1410       6 Minute Walk   Phase Initial Discharge    Distance 1420 feet 1425 feet    Distance % Change -- 0.3 %    Distance Feet Change -- 5 ft    Walk Time 6 minutes 6 minutes    # of Rest Breaks 0 0    MPH 2.7 2.7    METS 1.97 1.77    RPE 11 11    Perceived Dyspnea  3 1    VO2 Peak 6.9 6.21    Symptoms No Yes (comment)    Comments -- SOB    Resting HR 70 bpm 69 bpm    Resting BP 152/68 122/62    Resting Oxygen Saturation  96 % 98 %    Exercise Oxygen Saturation  during 6 min walk 93 % 94 %    Max Ex. HR 96 bpm 101 bpm    Max Ex. BP 172/68 142/64    2 Minute Post BP 146/70 116/62      Interval HR   1 Minute HR 87 86    2 Minute HR 86 89    3 Minute HR 91 89    4 Minute HR 95 95    5 Minute HR 96 99    6 Minute HR 71 101    2 Minute Post HR 78 81    Interval Heart Rate? Yes Yes      Interval Oxygen   Interval Oxygen? Yes Yes    Baseline Oxygen Saturation % 96 % 98 %    1 Minute Oxygen Saturation % 95 % 94 %    1 Minute Liters of Oxygen -- 0 L  RA    2 Minute Oxygen Saturation % 94 % 94 %    2 Minute Liters of Oxygen -- 0 L    3 Minute Oxygen Saturation % 93 % 94 %    3 Minute Liters of Oxygen -- 0 L    4 Minute Oxygen Saturation % 94 % 95 %    4 Minute Liters of Oxygen -- 0 L    5 Minute Oxygen Saturation % 94 % 95 %    5 Minute Liters of Oxygen -- 0 L    6 Minute Oxygen Saturation % 97 % 96 %    6 Minute Liters of Oxygen -- 0 L    2 Minute Post Oxygen Saturation % 97 % 98 %    2 Minute Post Liters of Oxygen --  0 L

## 2024-03-18 NOTE — Progress Notes (Signed)
 Daily Session Note  Patient Details  Name: Elizabeth [name] MRN: [id number] Date of Birth: 11/12/1927 Referring Provider:   Flowsheet Row Pulmonary Rehab from 12/02/2023 in [facility] Cardiac and Pulmonary Rehab  Referring Provider Dr. Dale Riggins       Encounter Date: 03/18/2024  Check In:  Session Check In - 03/18/24 1405       Check-In   Supervising physician immediately available to respond to emergencies See telemetry face sheet for immediately available ER MD    Location [name]    Staff Present Susann Givens RN,BSN;Kelly Madilyn Fireman BS, ACSM CEP, Exercise Physiologist;Noah Tickle, BS, Exercise Physiologist;Jason Wallace Cullens RDN,LDN    Virtual Visit No    Medication changes reported     No    Fall or balance concerns reported    No    Warm-up and Cool-down Performed on first and last piece of equipment    Resistance Training Performed Yes    VAD Patient? No    PAD/SET Patient? No      Pain Assessment   Currently in Pain? No/denies                Social History   Tobacco Use  Smoking Status Never  Smokeless Tobacco Never    Goals Met:  Independence with exercise equipment Exercise tolerated well No report of concerns or symptoms today Strength training completed today  Goals Unmet:  Not Applicable  Comments:  Elizabeth [name] graduated today from  rehab with 36 sessions completed.  Details of the patient's exercise prescription and what She needs to do in order to continue the prescription and progress were discussed with patient.  Patient was given a copy of prescription and goals.  Patient verbalized understanding. Elizabeth [name] plans to continue to exercise by walking.    Dr. Bethann Punches is Medical Director for [facility] Cardiac Rehabilitation.  Dr. Vida Rigger is Medical Director for [facility] Pulmonary Rehabilitation.

## 2024-03-18 NOTE — Progress Notes (Signed)
 Pulmonary Individual Treatment Plan  Patient Details  Name: Elizabeth [name] MRN: [id number] Date of Birth: [birth year] Referring Provider:   Flowsheet Row Pulmonary Rehab from 12/02/2023 in [facility] Cardiac and Pulmonary Rehab  Referring Provider Dr. Dale Concordia       Initial Encounter Date:  Flowsheet Row Pulmonary Rehab from 12/02/2023 in [facility] Cardiac and Pulmonary Rehab  Date 12/02/23       Visit Diagnosis: Heart failure, diastolic, chronic (HCC)  Patient's Home Medications on Admission:  Current Outpatient Medications:    amLODipine (NORVASC) 2.5 MG tablet, Take 1 tablet (2.5 mg total) by mouth daily., Disp: 90 tablet, Rfl: 3   aspirin 81 MG tablet, Take 81 mg by mouth daily., Disp: , Rfl:    atorvastatin (LIPITOR) 40 MG tablet, Take 1 tablet (40 mg total) by mouth daily., Disp: 90 tablet, Rfl: 3   cetirizine (ZYRTEC) 5 MG tablet, Take 5 mg by mouth daily as needed. , Disp: , Rfl:    lisinopril (ZESTRIL) 40 MG tablet, Take 1 tablet (40 mg total) by mouth daily., Disp: 90 tablet, Rfl: 3   meclizine (ANTIVERT) 12.5 MG tablet, Take 2 tablets (25 mg total) by mouth 3 (three) times daily as needed for dizziness., Disp: 30 tablet, Rfl: 0   metoprolol tartrate (LOPRESSOR) 25 MG tablet, Take 1 tablet (25 mg total) by mouth 2 (two) times daily., Disp: 180 tablet, Rfl: 3   OXYQUINOLONE SULFATE VAGINAL (TRIMO-SAN) 0.025 % GEL, Place 1 Applicatorful vaginally once a week., Disp: 113.4 g, Rfl: 3   triamcinolone (NASACORT) 55 MCG/ACT nasal inhaler, Place 2 sprays into the nose daily as needed. , Disp: , Rfl:   Past Medical History: Past Medical History:  Diagnosis Date   Cystocele or rectocele with incomplete uterine prolapse    Hyperlipidemia    Hypertension    Irritable bowel syndrome    Palpitations    Vaginal atrophy     Tobacco Use: Social History   Tobacco Use  Smoking Status Never  Smokeless Tobacco Never    Labs: Review Flowsheet  More data exists      Latest Ref Rng &  Units 12/03/2022 03/07/2023 06/20/2023 10/22/2023 02/20/2024  Labs for ITP Cardiac and Pulmonary Rehab  Cholestrol 0 - 200 mg/dL 409  811  914  782  956   LDL (calc) 0 - 99 mg/dL 98  - 79  98  84   Direct LDL mg/dL - 213.0  - - -  HDL-C >86.57 mg/dL 84.69  62.95  28.41  32.44  47.40   Trlycerides 0.0 - 149.0 mg/dL 010.2  725.3  664.4  034.7  119.0   Hemoglobin A1c 4.6 - 6.5 % 7.1  6.6  6.8  7.0  6.9      Pulmonary Assessment Scores:  Pulmonary Assessment Scores     Row Name 12/02/23 1526 12/04/23 1355 03/05/24 1437     ADL UCSD   ADL Phase -- Entry Exit   SOB Score total -- 2 12   Rest -- 1 0   Walk -- 0 0   Stairs -- 1 1   Bath -- 0 0   Dress -- 0 0   Shop -- 0 1     CAT Score   CAT Score 14 -- 6     mMRC Score   mMRC Score -- 1 --            UCSD: Self-administered rating of dyspnea associated with activities of daily living (ADLs) 6-point scale (  0 = "not at all" to 5 = "maximal or unable to do because of breathlessness")  Scoring Scores range from 0 to 120.  Minimally important difference is 5 units  CAT: CAT can identify the health impairment of COPD patients and is better correlated with disease progression.  CAT has a scoring range of zero to 40. The CAT score is classified into four groups of low (less than 10), medium (10 - 20), high (21-30) and very high (31-40) based on the impact level of disease on health status. A CAT score over 10 suggests significant symptoms.  A worsening CAT score could be explained by an exacerbation, poor medication adherence, poor inhaler technique, or progression of COPD or comorbid conditions.  CAT MCID is 2 points  mMRC: mMRC (Modified Medical Research Council) Dyspnea Scale is used to assess the degree of baseline functional disability in patients of respiratory disease due to dyspnea. No minimal important difference is established. A decrease in score of 1 point or greater is considered a positive change.   Pulmonary Function  Assessment:   Exercise Target Goals: Exercise Program Goal: Individual exercise prescription set using results from initial 6 min walk test and THRR while considering  patient's activity barriers and safety.   Exercise Prescription Goal: Initial exercise prescription builds to 30-45 minutes a day of aerobic activity, 2-3 days per week.  Home exercise guidelines will be given to patient during program as part of exercise prescription that the participant will acknowledge.  Education: Aerobic Exercise: - Group verbal and visual presentation on the components of exercise prescription. Introduces F.I.T.T principle from ACSM for exercise prescriptions.  Reviews F.I.T.T. principles of aerobic exercise including progression. Written material given at graduation. Flowsheet Row Pulmonary Rehab from 03/04/2019 in [facility] Cardiac and Pulmonary Rehab  Date 01/16/19  Educator [facility]  Instruction Review Code 1- Verbalizes Understanding       Education: Resistance Exercise: - Group verbal and visual presentation on the components of exercise prescription. Introduces F.I.T.T principle from ACSM for exercise prescriptions  Reviews F.I.T.T. principles of resistance exercise including progression. Written material given at graduation.    Education: Exercise & Equipment Safety: - Individual verbal instruction and demonstration of equipment use and safety with use of the equipment. Flowsheet Row Pulmonary Rehab from 12/04/2023 in [facility] Cardiac and Pulmonary Rehab  Date 12/04/23  Educator [facility]  Instruction Review Code 1- Verbalizes Understanding       Education: Exercise Physiology & General Exercise Guidelines: - Group verbal and written instruction with models to review the exercise physiology of the cardiovascular system and associated critical values. Provides general exercise guidelines with specific guidelines to those with heart or lung disease.  Flowsheet Row Pulmonary Rehab from 12/04/2023 in [facility]  Cardiac and Pulmonary Rehab  Education need identified 12/04/23       Education: Flexibility, Balance, Mind/Body Relaxation: - Group verbal and visual presentation with interactive activity on the components of exercise prescription. Introduces F.I.T.T principle from ACSM for exercise prescriptions. Reviews F.I.T.T. principles of flexibility and balance exercise training including progression. Also discusses the mind body connection.  Reviews various relaxation techniques to help reduce and manage stress (i.e. Deep breathing, progressive muscle relaxation, and visualization). Balance handout provided to take home. Written material given at graduation. Flowsheet Row Pulmonary Rehab from 03/04/2019 in [facility] Cardiac and Pulmonary Rehab  Date 01/21/19  Educator [facility]  Instruction Review Code 1- Verbalizes Understanding       Activity Barriers & Risk Stratification:  Activity Barriers & Cardiac Risk  Stratification - 12/02/23 1517       Activity Barriers & Cardiac Risk Stratification   Activity Barriers Balance Concerns;Shortness of Breath;Deconditioning;Muscular Weakness    Cardiac Risk Stratification Low             6 Minute Walk:  6 Minute Walk     Row Name 12/02/23 1514 03/04/24 1410       6 Minute Walk   Phase Initial Discharge    Distance 1420 feet 1425 feet    Distance % Change -- 0.3 %    Distance Feet Change -- 5 ft    Walk Time 6 minutes 6 minutes    # of Rest Breaks 0 0    MPH 2.7 2.7    METS 1.97 1.77    RPE 11 11    Perceived Dyspnea  3 1    VO2 Peak 6.9 6.21    Symptoms No Yes (comment)    Comments -- SOB    Resting HR 70 bpm 69 bpm    Resting BP 152/68 122/62    Resting Oxygen Saturation  96 % 98 %    Exercise Oxygen Saturation  during 6 min walk 93 % 94 %    Max Ex. HR 96 bpm 101 bpm    Max Ex. BP 172/68 142/64    2 Minute Post BP 146/70 116/62      Interval HR   1 Minute HR 87 86    2 Minute HR 86 89    3 Minute HR 91 89    4 Minute HR 95 95    5  Minute HR 96 99    6 Minute HR 71 101    2 Minute Post HR 78 81    Interval Heart Rate? Yes Yes      Interval Oxygen   Interval Oxygen? Yes Yes    Baseline Oxygen Saturation % 96 % 98 %    1 Minute Oxygen Saturation % 95 % 94 %    1 Minute Liters of Oxygen -- 0 L  RA    2 Minute Oxygen Saturation % 94 % 94 %    2 Minute Liters of Oxygen -- 0 L    3 Minute Oxygen Saturation % 93 % 94 %    3 Minute Liters of Oxygen -- 0 L    4 Minute Oxygen Saturation % 94 % 95 %    4 Minute Liters of Oxygen -- 0 L    5 Minute Oxygen Saturation % 94 % 95 %    5 Minute Liters of Oxygen -- 0 L    6 Minute Oxygen Saturation % 97 % 96 %    6 Minute Liters of Oxygen -- 0 L    2 Minute Post Oxygen Saturation % 97 % 98 %    2 Minute Post Liters of Oxygen -- 0 L            Oxygen Initial Assessment:  Oxygen Initial Assessment - 12/02/23 1529       Home Oxygen   Home Oxygen Device None    Sleep Oxygen Prescription None    Home Exercise Oxygen Prescription None    Home Resting Oxygen Prescription None    Compliance with Home Oxygen Use Yes      Initial 6 min Walk   Oxygen Used None      Program Oxygen Prescription   Program Oxygen Prescription None      Intervention   Short Term  Goals To learn and understand importance of maintaining oxygen saturations>88%;To learn and demonstrate proper pursed lip breathing techniques or other breathing techniques. ;To learn and understand importance of monitoring SPO2 with pulse oximeter and demonstrate accurate use of the pulse oximeter.    Long  Term Goals Maintenance of O2 saturations>88%;Exhibits proper breathing techniques, such as pursed lip breathing or other method taught during program session;Verbalizes importance of monitoring SPO2 with pulse oximeter and return demonstration             Oxygen Re-Evaluation:  Oxygen Re-Evaluation     Row Name 01/20/24 1436 03/02/24 1422           Program Oxygen Prescription   Program Oxygen  Prescription None None        Home Oxygen   Home Oxygen Device None None      Sleep Oxygen Prescription None None      Home Exercise Oxygen Prescription None None      Home Resting Oxygen Prescription None None      Compliance with Home Oxygen Use Yes Yes        Goals/Expected Outcomes   Short Term Goals To learn and understand importance of maintaining oxygen saturations>88%;To learn and demonstrate proper pursed lip breathing techniques or other breathing techniques. ;To learn and understand importance of monitoring SPO2 with pulse oximeter and demonstrate accurate use of the pulse oximeter. To learn and understand importance of maintaining oxygen saturations>88%;To learn and understand importance of monitoring SPO2 with pulse oximeter and demonstrate accurate use of the pulse oximeter.      Long  Term Goals Maintenance of O2 saturations>88%;Exhibits proper breathing techniques, such as pursed lip breathing or other method taught during program session;Verbalizes importance of monitoring SPO2 with pulse oximeter and return demonstration Maintenance of O2 saturations>88%;Verbalizes importance of monitoring SPO2 with pulse oximeter and return demonstration      Comments Verda Cumins states that she experiences some SOB at times which she attributes to her heart failure. We reviewed PLB technique today and encouraged her to practice PLB when feeling SOB. She does not have a pulse oximeter to check her oxygen saturation at home. Informed and explained why it is important to have one. Reviewed that oxygen saturations should be 88 percent and above. Patient verbalizes understanding.      Goals/Expected Outcomes Short: Become independent at home with PLB. Long: maintain independence with SOB management.  Short: monitor oxygen at home with exertion. Long: maintain oxygen saturations above 88 percent independently.               Oxygen Discharge (Final Oxygen Re-Evaluation):  Oxygen Re-Evaluation - 03/02/24  1422       Program Oxygen Prescription   Program Oxygen Prescription None      Home Oxygen   Home Oxygen Device None    Sleep Oxygen Prescription None    Home Exercise Oxygen Prescription None    Home Resting Oxygen Prescription None    Compliance with Home Oxygen Use Yes      Goals/Expected Outcomes   Short Term Goals To learn and understand importance of maintaining oxygen saturations>88%;To learn and understand importance of monitoring SPO2 with pulse oximeter and demonstrate accurate use of the pulse oximeter.    Long  Term Goals Maintenance of O2 saturations>88%;Verbalizes importance of monitoring SPO2 with pulse oximeter and return demonstration    Comments She does not have a pulse oximeter to check her oxygen saturation at home. Informed and explained why it is important to have  one. Reviewed that oxygen saturations should be 88 percent and above. Patient verbalizes understanding.    Goals/Expected Outcomes Short: monitor oxygen at home with exertion. Long: maintain oxygen saturations above 88 percent independently.             Initial Exercise Prescription:  Initial Exercise Prescription - 12/02/23 1500       Date of Initial Exercise RX and Referring Provider   Date 12/02/23    Referring Provider Dr. Dale Jacumba      Oxygen   Maintain Oxygen Saturation 88% or higher      Treadmill   MPH 2.5    Grade 0    Minutes 15    METs 2.91      Recumbant Bike   Level 2    RPM 50    Watts 25    Minutes 15    METs 1.97      NuStep   Level 2    SPM 80    Minutes 15    METs 1.97      REL-XR   Level 2    Watts 25    Speed 50    Minutes 15    METs 1.97      T5 Nustep   Level 2    SPM 80    Minutes 15    METs 1.97      Track   Laps 19    Minutes 15    METs 2.09      Intensity   THRR 40-80% of Max Heartrate 92-114    Ratings of Perceived Exertion 11-13    Perceived Dyspnea 0-4      Progression   Progression Continue to progress workloads to  maintain intensity without signs/symptoms of physical distress.      Resistance Training   Training Prescription Yes    Weight 3 lb    Reps 10-15             Perform Capillary Blood Glucose checks as needed.  Exercise Prescription Changes:   Exercise Prescription Changes     Row Name 12/02/23 1500 12/11/23 1100 12/24/23 1400 01/13/24 1400 01/23/24 0800     Response to Exercise   Blood Pressure (Admit) 152/68 126/70 122/66 -- 126/62   Blood Pressure (Exercise) 172/68 162/68 142/68 -- 144/70   Blood Pressure (Exit) 146/70 116/62 118/60 -- 104/60   Heart Rate (Admit) 70 bpm 72 bpm 73 bpm -- 68 bpm   Heart Rate (Exercise) 96 bpm 93 bpm 90 bpm -- 95 bpm   Heart Rate (Exit) 78 bpm 84 bpm 73 bpm -- 79 bpm   Oxygen Saturation (Admit) 96 % 95 % 97 % -- 92 %   Oxygen Saturation (Exercise) 93 % 93 % 91 % -- 94 %   Oxygen Saturation (Exit) 97 % 95 % 94 % -- 94 %   Rating of Perceived Exertion (Exercise) 11 14 13  -- 11   Perceived Dyspnea (Exercise) 3 1 1  -- 1   Symptoms none none none -- none   Comments results First two days of exercise -- -- --   Duration -- Progress to 30 minutes of  aerobic without signs/symptoms of physical distress Continue with 30 min of aerobic exercise without signs/symptoms of physical distress. Continue with 30 min of aerobic exercise without signs/symptoms of physical distress. Continue with 30 min of aerobic exercise without signs/symptoms of physical distress.   Intensity -- THRR unchanged THRR unchanged THRR unchanged THRR unchanged  Progression   Progression -- Continue to progress workloads to maintain intensity without signs/symptoms of physical distress. Continue to progress workloads to maintain intensity without signs/symptoms of physical distress. Continue to progress workloads to maintain intensity without signs/symptoms of physical distress. Continue to progress workloads to maintain intensity without signs/symptoms of physical distress.    Average METs -- 2.46 2.3 2.3 3.07     Resistance Training   Training Prescription -- Yes Yes Yes Yes   Weight -- 3 lb 3 lb 3 lb 3 lb   Reps -- 10-15 10-15 10-15 10-15     Interval Training   Interval Training No No No No No     Recumbant Bike   Level -- 2 -- -- --   Watts -- 11 -- -- --   Minutes -- 15 -- -- --   METs -- 3.2 -- -- --     NuStep   Level -- 2 3 3  --   Minutes -- 15 15 15  --   METs -- 2 2.5 2.5 --     REL-XR   Level -- 1 1 1 2    Minutes -- 15 15 15 15    METs -- 2 1.6 1.6 1.8     Track   Laps -- 30 30 30  53   Minutes -- 15 15 15 15    METs -- 2.63 2.63 2.63 3.83     Home Exercise Plan   Plans to continue exercise at -- -- -- Home (comment)  walk and do balance exercises Home (comment)  walk and do balance exercises   Frequency -- -- -- Add 2 additional days to program exercise sessions. Add 2 additional days to program exercise sessions.   Initial Home Exercises Provided -- -- -- 01/13/24 01/13/24     Oxygen   Maintain Oxygen Saturation -- 88% or higher 88% or higher 88% or higher 88% or higher    Row Name 02/06/24 1500 02/20/24 1200 03/05/24 1500         Response to Exercise   Blood Pressure (Admit) 106/66 128/64 122/60     Blood Pressure (Exit) 122/60 126/64 94/54     Heart Rate (Admit) 71 bpm 66 bpm 65 bpm     Heart Rate (Exercise) 97 bpm 92 bpm 95 bpm     Heart Rate (Exit) 70 bpm 71 bpm 74 bpm     Oxygen Saturation (Admit) 95 % 96 % 97 %     Oxygen Saturation (Exercise) 92 % 89 % 93 %     Oxygen Saturation (Exit) 98 % 94 % 98 %     Rating of Perceived Exertion (Exercise) 13 13 13      Perceived Dyspnea (Exercise) 1 1 1      Symptoms none none none     Duration Continue with 30 min of aerobic exercise without signs/symptoms of physical distress. Continue with 30 min of aerobic exercise without signs/symptoms of physical distress. Continue with 30 min of aerobic exercise without signs/symptoms of physical distress.     Intensity THRR unchanged THRR  unchanged THRR unchanged       Progression   Progression Continue to progress workloads to maintain intensity without signs/symptoms of physical distress. Continue to progress workloads to maintain intensity without signs/symptoms of physical distress. Continue to progress workloads to maintain intensity without signs/symptoms of physical distress.     Average METs 3.7 4.27 4.29       Resistance Training   Training Prescription Yes Yes Yes  Weight 3 lb 3 lb 3 lb     Reps 10-15 10-15 10-15       Interval Training   Interval Training No No No       Track   Laps 60 60 62     Minutes 15 15 15      METs 4.27 4.27 4.38       Home Exercise Plan   Plans to continue exercise at Home (comment)  walk and do balance exercises Home (comment)  walk and do balance exercises Home (comment)  walk and do balance exercises     Frequency Add 2 additional days to program exercise sessions. Add 2 additional days to program exercise sessions. Add 2 additional days to program exercise sessions.     Initial Home Exercises Provided 01/13/24 01/13/24 01/13/24       Oxygen   Maintain Oxygen Saturation 88% or higher 88% or higher 88% or higher              Exercise Comments:   Exercise Comments     Row Name 12/04/23 1404           Exercise Comments First full day of exercise!  Patient was oriented to gym and equipment including functions, settings, policies, and procedures.  Patient's individual exercise prescription and treatment plan were reviewed.  All starting workloads were established based on the results of the 6 minute walk test done at initial orientation visit.  The plan for exercise progression was also introduced and progression will be customized based on patient's performance and goals.                Exercise Goals and Review:   Exercise Goals     Row Name 12/02/23 1522             Exercise Goals   Increase Physical Activity Yes       Intervention Develop an  individualized exercise prescription for aerobic and resistive training based on initial evaluation findings, risk stratification, comorbidities and participant's personal goals.;Provide advice, education, support and counseling about physical activity/exercise needs.       Expected Outcomes Long Term: Exercising regularly at least 3-5 days a week.;Long Term: Add in home exercise to make exercise part of routine and to increase amount of physical activity.;Short Term: Attend rehab on a regular basis to increase amount of physical activity.       Increase Strength and Stamina Yes       Intervention Develop an individualized exercise prescription for aerobic and resistive training based on initial evaluation findings, risk stratification, comorbidities and participant's personal goals.;Provide advice, education, support and counseling about physical activity/exercise needs.       Expected Outcomes Long Term: Improve cardiorespiratory fitness, muscular endurance and strength as measured by increased METs and functional capacity ( );Short Term: Perform resistance training exercises routinely during rehab and add in resistance training at home;Short Term: Increase workloads from initial exercise prescription for resistance, speed, and METs.       Able to understand and use rate of perceived exertion (RPE) scale Yes       Intervention Provide education and explanation on how to use RPE scale       Expected Outcomes Long Term:  Able to use RPE to guide intensity level when exercising independently;Short Term: Able to use RPE daily in rehab to express subjective intensity level       Able to understand and use Dyspnea scale Yes  Intervention Provide education and explanation on how to use Dyspnea scale       Expected Outcomes Long Term: Able to use Dyspnea scale to guide intensity level when exercising independently;Short Term: Able to use Dyspnea scale daily in rehab to express subjective sense of  shortness of breath during exertion       Knowledge and understanding of Target Heart Rate Range (THRR) Yes       Intervention Provide education and explanation of THRR including how the numbers were predicted and where they are located for reference       Expected Outcomes Long Term: Able to use THRR to govern intensity when exercising independently;Short Term: Able to use daily as guideline for intensity in rehab;Short Term: Able to state/look up THRR       Able to check pulse independently Yes       Intervention Review the importance of being able to check your own pulse for safety during independent exercise;Provide education and demonstration on how to check pulse in carotid and radial arteries.       Expected Outcomes Long Term: Able to check pulse independently and accurately;Short Term: Able to explain why pulse checking is important during independent exercise       Understanding of Exercise Prescription Yes       Intervention Provide education, explanation, and written materials on patient's individual exercise prescription       Expected Outcomes Long Term: Able to explain home exercise prescription to exercise independently;Short Term: Able to explain program exercise prescription                Exercise Goals Re-Evaluation :  Exercise Goals Re-Evaluation     Row Name 12/04/23 1404 12/11/23 1140 12/24/23 1458 01/13/24 1435 01/23/24 0819     Exercise Goal Re-Evaluation   Exercise Goals Review Increase Physical Activity;Able to understand and use rate of perceived exertion (RPE) scale;Knowledge and understanding of Target Heart Rate Range (THRR);Understanding of Exercise Prescription;Increase Strength and Stamina;Able to understand and use Dyspnea scale;Able to check pulse independently Increase Physical Activity;Increase Strength and Stamina;Understanding of Exercise Prescription Increase Physical Activity;Increase Strength and Stamina;Understanding of Exercise Prescription Increase  Physical Activity;Able to understand and use rate of perceived exertion (RPE) scale;Knowledge and understanding of Target Heart Rate Range (THRR);Understanding of Exercise Prescription;Increase Strength and Stamina;Able to understand and use Dyspnea scale;Able to check pulse independently Increase Physical Activity;Increase Strength and Stamina;Understanding of Exercise Prescription   Comments Reviewed RPE and dyspnea scale, THR and program prescription with pt today.  Pt voiced understanding and was given a copy of goals to take home. Ziya is off to a good start in the program. She did well walking the track and was able to reach 30 laps. She also did well at level 2 on both the T4 nustep and recumbent bike, and level 1 on the XR. She also has done well with 3 lb hand weights for resistance training. We will continue to monitor her progress in the program. Jene is doing well in rehab. She continues to do well walking on the track as she has conssitently reached 30 laps. She also improved to level 3 on the T4 nustep and stayed consistent on the XR at level 1. We will continue to monitor her progress in the program. Reviewed home exercise with pt today from 2:01 pm to 2:15 pm.  Pt plans to walk and do balance exercises for exercise.  Reviewed THR, pulse, RPE, sign and symptoms, pulse oximetery and when to  call 911 or MD.  Also discussed weather considerations and indoor options.  Pt voiced understanding. Anayelli continues to do well in rehab. She was able to increase her laps on the track from 30laps to 53, in 15 minutes. She was also able to increase her level on the XR from level 1 to 2. WE will continue to encourage and monitor her progress in the program.   Expected Outcomes Short: Use RPE daily to regulate intensity.  Long: Follow program prescription in THR. Short: Continue to follow current exercise prescription. Long: Continue exercise to improve strength and stamina. Short: Increase to level 2 on the  XR. Long: Continue exercise to improve strength and stamina. Short: add 1-2 days a week of exercise at home on off days of rehab. Long: maintain independent exercise routine. Short: Continue to push for more track laps. Long: Continue exercise to increase strength and stamina.    Row Name 02/06/24 1540 02/20/24 1255 03/05/24 1527 03/11/24 1431       Exercise Goal Re-Evaluation   Exercise Goals Review Increase Physical Activity;Increase Strength and Stamina;Understanding of Exercise Prescription Increase Physical Activity;Increase Strength and Stamina;Understanding of Exercise Prescription Increase Physical Activity;Increase Strength and Stamina;Understanding of Exercise Prescription Increase Physical Activity;Increase Strength and Stamina;Understanding of Exercise Prescription    Comments Shaira continues to do well in the program. She has been walking the track and has progressively improved each time. She most recently was able to walk up to 60 laps! We will continue to monitor her progress in the program. Darlis continues to do well in rehab. She has continued to walk up to 60 laps on the track and has worked within her THRR a few times. She also continues to use 3 lb handweights for resistance training as well. We will continue to monitor her progress in the program. Tahni continues to do well in rehab. She increased her walking distance up to 62 laps on the track and has continued to work within her THRR. She also completed her post and improved by 5 ft. We will continue to monitor her progress until she graduates from the program. Veta continues to work on blance exercises with the PT group in the community she lives in. She also has  the option to go to that PT gym and walk. She is consistently getting 3 days a week of exercise in. She also is looking into the [facility] to possibly join upon graduation from cardiac rehab. She will decide between the PT gym and the [facility].    Expected  Outcomes Short: Continue to push for more laps on the track. Long: Continue exercise to increase strength and stamina. Short: Continue to push for more laps on the track. Long: Continue exercise to increase strength and stamina. Short: Graduate. Long: Continue to exercise independently. Short: graduate from cardiac rehab. Long: continue with independent exercise routine.             Discharge Exercise Prescription (Final Exercise Prescription Changes):  Exercise Prescription Changes - 03/05/24 1500       Response to Exercise   Blood Pressure (Admit) 122/60    Blood Pressure (Exit) 94/54    Heart Rate (Admit) 65 bpm    Heart Rate (Exercise) 95 bpm    Heart Rate (Exit) 74 bpm    Oxygen Saturation (Admit) 97 %    Oxygen Saturation (Exercise) 93 %    Oxygen Saturation (Exit) 98 %    Rating of Perceived Exertion (Exercise) 13    Perceived  Dyspnea (Exercise) 1    Symptoms none    Duration Continue with 30 min of aerobic exercise without signs/symptoms of physical distress.    Intensity THRR unchanged      Progression   Progression Continue to progress workloads to maintain intensity without signs/symptoms of physical distress.    Average METs 4.29      Resistance Training   Training Prescription Yes    Weight 3 lb    Reps 10-15      Interval Training   Interval Training No      Track   Laps 62    Minutes 15    METs 4.38      Home Exercise Plan   Plans to continue exercise at Home (comment)   walk and do balance exercises   Frequency Add 2 additional days to program exercise sessions.    Initial Home Exercises Provided 01/13/24      Oxygen   Maintain Oxygen Saturation 88% or higher             Nutrition:  Target Goals: Understanding of nutrition guidelines, daily intake of sodium 1500mg , cholesterol 200mg , calories 30% from fat and 7% or less from saturated fats, daily to have 5 or more servings of fruits and vegetables.  Education: All About Nutrition: -Group  instruction provided by verbal, written material, interactive activities, discussions, models, and posters to present general guidelines for heart healthy nutrition including fat, fiber, MyPlate, the role of sodium in heart healthy nutrition, utilization of the nutrition label, and utilization of this knowledge for meal planning. Follow up email sent as well. Written material given at graduation. Flowsheet Row Pulmonary Rehab from 12/04/2023 in [facility] Card[facility] and Pul[facility] Rehab  Education need identified 12/04/23       Biometrics:  Pre Biometrics - 12/02/23 1523       Pre Biometrics   Height 5' 1.5" (1.562 m)    Weight 144 lb 4.8 oz (65.5 kg)    Waist Circumference 34.5 inches    Hip Circumference 40.5 inches    Waist to Hip Ratio 0.85 %    BMI (Calculated) 26.83    Single Leg Stand 6.7 seconds             Post Biometrics - 03/04/24 1413        Post  Biometrics   Height 5' 1.5" (1.562 m)    Weight 143 lb 9.6 oz (65.1 kg)    Waist Circumference 33 inches    Hip Circumference 38 inches    Waist to Hip Ratio 0.87 %    BMI (Calculated) 26.7    Single Leg Stand 11.2 seconds             Nutrition Therapy Plan and Nutrition Goals:   Nutrition Assessments:  MEDIFICTS Score Key: >=70 Need to make dietary changes  40-70 Heart Healthy Diet <= 40 Therapeutic Level Cholesterol Diet  Flowsheet Row Pulmonary Rehab from 03/05/2024 in [facility] Cardiac and Pulmonary Rehab  Picture Your Plate Total Score on Discharge 75      Picture Your Plate Scores: <16 Unhealthy dietary pattern with much room for improvement. 41-50 Dietary pattern unlikely to meet recommendations for good health and room for improvement. 51-60 More healthful dietary pattern, with some room for improvement.  >60 Healthy dietary pattern, although there may be some specific behaviors that could be improved.   Nutrition Goals Re-Evaluation:  Nutrition Goals Re-Evaluation     Row Name 01/20/24 1428 03/02/24  1426  Goals   Nutrition Goal Rd appt Deferred at this time. Rd appt Deferred at this time.               Nutrition Goals Discharge (Final Nutrition Goals Re-Evaluation):  Nutrition Goals Re-Evaluation - 03/02/24 1426       Goals   Nutrition Goal Rd appt Deferred at this time.             Psychosocial: Target Goals: Acknowledge presence or absence of significant depression and/or stress, maximize coping skills, provide positive support system. Participant is able to verbalize types and ability to use techniques and skills needed for reducing stress and depression.   Education: Stress, Anxiety, and Depression - Group verbal and visual presentation to define topics covered.  Reviews how body is impacted by stress, anxiety, and depression.  Also discusses healthy ways to reduce stress and to treat/manage anxiety and depression.  Written material given at graduation. Flowsheet Row Pulmonary Rehab from 03/04/2019 in [facility] Cardiac and Pulmonary Rehab  Date 02/25/19  Educator KG  Instruction Review Code 1- [employer] Understanding       Education: Sleep Hygiene -Provides group verbal and written instruction about how sleep can affect your health.  Define sleep hygiene, discuss sleep cycles and impact of sleep habits. Review good sleep hygiene tips.    Initial Review & Psychosocial Screening:  Initial Psych Review & Screening - 11/28/23 1348       Initial Review   Current issues with History of Depression      Family Dynamics   Good Support System? Yes      Barriers   Psychosocial barriers to participate in program The patient should benefit from training in stress management and relaxation.      Screening Interventions   Interventions Encouraged to exercise;To provide support and resources with identified psychosocial needs;Provide feedback about the scores to participant    Expected Outcomes Short Term goal: Utilizing psychosocial counselor, staff and  physician to assist with identification of specific Stressors or current issues interfering with healing process. Setting desired goal for each stressor or current issue identified.;Long Term Goal: Stressors or current issues are controlled or eliminated.;Short Term goal: Identification and review with participant of any Quality of Life or Depression concerns found by scoring the questionnaire.;Long Term goal: The participant improves quality of Life and PHQ9 Scores as seen by post scores and/or verbalization of changes             Quality of Life Scores:  Scores of 19 and below usually indicate a poorer quality of life in these areas.  A difference of  2-3 points is a clinically meaningful difference.  A difference of 2-3 points in the total score of the Quality of Life Index has been associated with significant improvement in overall quality of life, self-image, physical symptoms, and general health in studies assessing change in quality of life.  PHQ-9: Review Flowsheet  More data exists      03/05/2024 12/02/2023 06/24/2023 12/06/2022 10/02/2022  Depression screen PHQ 2/9  Decreased Interest 0 1 0 0 0  Down, Depressed, Hopeless 0 0 0 0 0  PHQ - 2 Score 0 1 0 0 0  Altered sleeping 1 0 2 1 -  Tired, decreased energy 1 1 0 1 -  Change in appetite 0 0 0 0 -  Feeling bad or failure about yourself  0 0 0 0 -  Trouble concentrating 0 0 0 0 -  Moving slowly or fidgety/restless 0 0  0 0 -  Suicidal thoughts 0 0 0 0 -  PHQ-9 Score 2 2 2 2  -  Difficult doing work/chores Not difficult at all Not difficult at all Not difficult at all Not difficult at all -   Interpretation of Total Score  Total Score Depression Severity:  1-4 = Minimal depression, 5-9 = Mild depression, 10-14 = Moderate depression, 15-19 = Moderately severe depression, 20-27 = Severe depression   Psychosocial Evaluation and Intervention:  Psychosocial Evaluation - 11/28/23 1349       Psychosocial Evaluation & Interventions    Interventions Encouraged to exercise with the program and follow exercise prescription    Comments Harleyquinn is coming to pulmonary rehab with heart failure. She did the program in early 2020, but stopped due to the beginning of Covid. She states she stays very active for a woman in her 90s. She enjoys painting and walking. She recently moved to the independent living at [facility]. She says she just finished PT there for her ongoing vertigo problems and really enjoyed the staff there. She wants to get more active since she stayed indoors a lot during covid and is looking forward to attending the program. She does not report any stress concerns today, says she takes each day as it comes. She is motivated to attend the program so she can be udner supervision as she starts increasing her activity level.    Expected Outcomes Short: attend cardiac rehab for educaiton and exercise. Long; Develop and maintain positive self care habits    Continue Psychosocial Services  Follow up required by staff             Psychosocial Re-Evaluation:  Psychosocial Re-Evaluation     Row Name 01/20/24 1428 03/02/24 1425           Psychosocial Re-Evaluation   Current issues with Current Stress Concerns None Identified      Comments Anneli states that she experiences some stress with the current state of the world and has had to stop watching the news. She aslo reports that holidays are hard for her, but she is ready to get back to a regular routine since we are past the holiday season. She is mostly sleeping well, although she does wake up occasionally throughout the night. Her family is a good support system when they call to check on her. Patient reports no issues with their current mental states, sleep, stress, depression or anxiety. Will follow up with patient in a few weeks for any changes.      Expected Outcomes Short: Continue to attend rehab for mental boost. Long: Continue to maintain positive outlook.  Short: Continue to exercise regularly to support mental health and notify staff of any changes. Long: maintain mental health and well being through teaching of rehab or prescribed medications independently.      Interventions -- Encouraged to attend Pulmonary Rehabilitation for the exercise      Continue Psychosocial Services  Follow up required by staff Follow up required by staff               Psychosocial Discharge (Final Psychosocial Re-Evaluation):  Psychosocial Re-Evaluation - 03/02/24 1425       Psychosocial Re-Evaluation   Current issues with None Identified    Comments Patient reports no issues with their current mental states, sleep, stress, depression or anxiety. Will follow up with patient in a few weeks for any changes.    Expected Outcomes Short: Continue to exercise regularly to support mental  health and notify staff of any changes. Long: maintain mental health and well being through teaching of rehab or prescribed medications independently.    Interventions Encouraged to attend Pulmonary Rehabilitation for the exercise    Continue Psychosocial Services  Follow up required by staff             Education: Education Goals: Education classes will be provided on a weekly basis, covering required topics. Participant will state understanding/return demonstration of topics presented.  Learning Barriers/Preferences:  Learning Barriers/Preferences - 11/28/23 1333       Learning Barriers/Preferences   Learning Barriers Hearing    Learning Preferences Individual Instruction             General Pulmonary Education Topics:  Infection Prevention: - Provides verbal and written material to individual with discussion of infection control including proper hand washing and proper equipment cleaning during exercise session. Flowsheet Row Pulmonary Rehab from 12/04/2023 in [facility] Cardiac and Pulmonary Rehab  Date 12/04/23  Educator [facility]  Instruction Review Code 1- Verbalizes  Understanding       Falls Prevention: - Provides verbal and written material to individual with discussion of falls prevention and safety. Flowsheet Row Pulmonary Rehab from 12/04/2023 in [facility] Cardiac and Pulmonary Rehab  Date 12/04/23  Educator [facility]  Instruction Review Code 1- Verbalizes Understanding       Chronic Lung Disease Review: - Group verbal instruction with posters, models, PowerPoint presentations and videos,  to review new updates, new respiratory medications, new advancements in procedures and treatments. Providing information on websites and "800" numbers for continued self-education. Includes information about supplement oxygen, available portable oxygen systems, continuous and intermittent flow rates, oxygen safety, concentrators, and Medicare reimbursement for oxygen. Explanation of Pulmonary Drugs, including class, frequency, complications, importance of spacers, rinsing mouth after steroid MDI's, and proper cleaning methods for nebulizers. Review of basic lung anatomy and physiology related to function, structure, and complications of lung disease. Review of risk factors. Discussion about methods for diagnosing sleep apnea and types of masks and machines for OSA. Includes a review of the use of types of environmental controls: home humidity, furnaces, filters, dust mite/pet prevention, HEPA vacuums. Discussion about weather changes, air quality and the benefits of nasal washing. Instruction on Warning signs, infection symptoms, calling MD promptly, preventive modes, and value of vaccinations. Review of effective airway clearance, coughing and/or vibration techniques. Emphasizing that all should Create an Action Plan. Written material given at graduation. Flowsheet Row Pulmonary Rehab from 12/04/2023 in [facility] Cardiac and Pulmonary Rehab  Date 12/04/23  Educator [facility]  Instruction Review Code 1- Verbalizes Understanding       AED/CPR: - Group verbal and written instruction with  the use of models to demonstrate the basic use of the AED with the basic ABC's of resuscitation.    Anatomy and Cardiac Procedures: - Group verbal and visual presentation and models provide information about basic cardiac anatomy and function. Reviews the testing methods done to diagnose heart disease and the outcomes of the test results. Describes the treatment choices: Medical Management, Angioplasty, or Coronary Bypass Surgery for treating various heart conditions including Myocardial Infarction, Angina, Valve Disease, and Cardiac Arrhythmias.  Written material given at graduation.   Medication Safety: - Group verbal and visual instruction to review commonly prescribed medications for heart and lung disease. Reviews the medication, class of the drug, and side effects. Includes the steps to properly store meds and maintain the prescription regimen.  Written material given at graduation.   Other: -Provides  group and verbal instruction on various topics (see comments)   Knowledge Questionnaire Score:  Knowledge Questionnaire Score - 03/05/24 1439       Knowledge Questionnaire Score   Post Score 17/18              Core Components/Risk Factors/Patient Goals at Admission:  Personal Goals and Risk Factors at Admission - 11/28/23 1332       Core Components/Risk Factors/Patient Goals on Admission   Improve shortness of breath with ADL's Yes    Intervention Provide education, individualized exercise plan and daily activity instruction to help decrease symptoms of SOB with activities of daily living.    Expected Outcomes Short Term: Improve cardiorespiratory fitness to achieve a reduction of symptoms when performing ADLs;Long Term: Be able to perform more ADLs without symptoms or delay the onset of symptoms    Diabetes Yes   Diet controlled   Intervention Provide education about signs/symptoms and action to take for hypo/hyperglycemia.;Provide education about proper nutrition, including  hydration, and aerobic/resistive exercise prescription along with prescribed medications to achieve blood glucose in normal ranges: Fasting glucose 65-99 mg/dL    Expected Outcomes Short Term: Participant verbalizes understanding of the signs/symptoms and immediate care of hyper/hypoglycemia, proper foot care and importance of medication, aerobic/resistive exercise and nutrition plan for blood glucose control.;Long Term: Attainment of HbA1C < 7%.    Hypertension Yes    Intervention Provide education on lifestyle modifcations including regular physical activity/exercise, weight management, moderate sodium restriction and increased consumption of fresh fruit, vegetables, and low fat dairy, alcohol moderation, and smoking cessation.;Monitor prescription use compliance.    Expected Outcomes Short Term: Continued assessment and intervention until BP is < 140/86mm HG in hypertensive participants. < 130/27mm HG in hypertensive participants with diabetes, heart failure or chronic kidney disease.;Long Term: Maintenance of blood pressure at goal levels.    Lipids Yes    Intervention Provide education and support for participant on nutrition & aerobic/resistive exercise along with prescribed medications to achieve LDL 70mg , HDL >40mg .    Expected Outcomes Short Term: Participant states understanding of desired cholesterol values and is compliant with medications prescribed. Participant is following exercise prescription and nutrition guidelines.;Long Term: Cholesterol controlled with medications as prescribed, with individualized exercise RX and with personalized nutrition plan. Value goals: LDL < 70mg , HDL > 40 mg.             Education:Diabetes - Individual verbal and written instruction to review signs/symptoms of diabetes, desired ranges of glucose level fasting, after meals and with exercise. Acknowledge that pre and post exercise glucose checks will be done for 3 sessions at entry of program.   Know  Your Numbers and Heart Failure: - Group verbal and visual instruction to discuss disease risk factors for cardiac and pulmonary disease and treatment options.  Reviews associated critical values for Overweight/Obesity, Hypertension, Cholesterol, and Diabetes.  Discusses basics of heart failure: signs/symptoms and treatments.  Introduces Heart Failure Zone chart for action plan for heart failure.  Written material given at graduation.   Core Components/Risk Factors/Patient Goals Review:   Goals and Risk Factor Review     Row Name 01/20/24 1432 03/02/24 1424           Core Components/Risk Factors/Patient Goals Review   Personal Goals Review Hypertension;Diabetes Improve shortness of breath with ADL's      Review Verda Cumins states that her blood sugar is well regulated by her diet per her doctor. She states that she does not check her blood sugars  routinely but her doctor is pleased with where she is at this time. She also is occasionally checking her blood pressure which she reports has stayed within normal ranges. Spoke to patient about their shortness of breath and what they can do to improve. Patient has been informed of breathing techniques when starting the program. Patient is informed to tell staff if they have had any med changes and that certain meds they are taking or not taking can be causing shortness of breath.      Expected Outcomes Short: Continue to routinely check BP. Long: Continue to manage lifestyle risk factors. Short: Attend [facility] regularly to improve shortness of breath with ADL's. Long: maintain independence with ADL's               Core Components/Risk Factors/Patient Goals at Discharge (Final Review):   Goals and Risk Factor Review - 03/02/24 1424       Core Components/Risk Factors/Patient Goals Review   Personal Goals Review Improve shortness of breath with ADL's    Review Spoke to patient about their shortness of breath and what they can do to improve. Patient  has been informed of breathing techniques when starting the program. Patient is informed to tell staff if they have had any med changes and that certain meds they are taking or not taking can be causing shortness of breath.    Expected Outcomes Short: Attend [facility] regularly to improve shortness of breath with ADL's. Long: maintain independence with ADL's             ITP Comments:  ITP Comments     Row Name 11/28/23 1339 12/02/23 1513 12/04/23 1404 12/11/23 1206 01/08/24 1319   ITP Comments Initial phone call completed. Diagnosis can be found in [facility] 11/1. EP Orientation scheduled for Monday 12/9 at 1:30. Completed and gym orientation. Initial ITP created and sent for review to Dr. Jinny Sanders, Medical Director. First full day of exercise!  Patient was oriented to gym and equipment including functions, settings, policies, and procedures.  Patient's individual exercise prescription and treatment plan were reviewed.  All starting workloads were established based on the results of the 6 minute walk test done at initial orientation visit.  The plan for exercise progression was also introduced and progression will be customized based on patient's performance and goals. 30 Day review completed. Medical Director ITP review done, changes made as directed, and signed approval by Medical Director.    new to program 30 Day review completed. Medical Director ITP review done, changes made as directed, and signed approval by Medical Director.    new to program    Row Name 02/05/24 0841 03/18/24 1406         ITP Comments 30 Day review completed. Medical Director ITP review done, changes made as directed, and signed approval by Medical Director. Phyillis graduated today from  rehab with 36 sessions completed.  Details of the patient's exercise prescription and what She needs to do in order to continue the prescription and progress were discussed with patient.  Patient was given a copy of prescription and  goals.  Patient verbalized understanding. Shena plans to continue to exercise by walking.               Comments: Discharge ITP

## 2024-03-23 ENCOUNTER — Other Ambulatory Visit: Payer: Self-pay

## 2024-03-23 DIAGNOSIS — I10 Essential (primary) hypertension: Secondary | ICD-10-CM

## 2024-03-24 ENCOUNTER — Other Ambulatory Visit (INDEPENDENT_AMBULATORY_CARE_PROVIDER_SITE_OTHER)

## 2024-03-24 DIAGNOSIS — I10 Essential (primary) hypertension: Secondary | ICD-10-CM

## 2024-03-25 LAB — BASIC METABOLIC PANEL WITH GFR
BUN: 27 mg/dL — ABNORMAL HIGH (ref 6–23)
CO2: 21 meq/L (ref 19–32)
Calcium: 8.9 mg/dL (ref 8.4–10.5)
Chloride: 106 meq/L (ref 96–112)
Creatinine, Ser: 1.39 mg/dL — ABNORMAL HIGH (ref 0.40–1.20)
GFR: 32.33 mL/min — ABNORMAL LOW (ref >60.00)
Glucose, Bld: 132 mg/dL — ABNORMAL HIGH (ref 70–99)
Potassium: 4.5 meq/L (ref 3.5–5.1)
Sodium: 137 meq/L (ref 135–145)

## 2024-03-26 ENCOUNTER — Other Ambulatory Visit: Payer: Self-pay | Admitting: Internal Medicine

## 2024-03-26 DIAGNOSIS — N1831 Chronic kidney disease, stage 3a: Secondary | ICD-10-CM

## 2024-03-26 NOTE — Progress Notes (Signed)
 Order placed for f/u met b and for renal ultrasound.

## 2024-04-01 ENCOUNTER — Telehealth: Payer: Self-pay | Admitting: Internal Medicine

## 2024-04-01 NOTE — Telephone Encounter (Signed)
 Lft pt vm to call ofc to sch [place]. thanks ?

## 2024-04-07 ENCOUNTER — Ambulatory Visit
Admission: RE | Admit: 2024-04-07 | Discharge: 2024-04-07 | Disposition: A | Discharge status: Home / Self Care | Source: Ambulatory Visit | Attending: Internal Medicine | Admitting: Internal Medicine

## 2024-04-07 DIAGNOSIS — N1831 Chronic kidney disease, stage 3a: Secondary | ICD-10-CM | POA: Insufficient documentation

## 2024-04-15 ENCOUNTER — Other Ambulatory Visit: Payer: Self-pay | Admitting: Internal Medicine

## 2024-04-15 DIAGNOSIS — N2889 Other specified disorders of kidney and ureter: Secondary | ICD-10-CM

## 2024-04-15 NOTE — Progress Notes (Signed)
 Order placed for urology referral.

## 2024-04-24 ENCOUNTER — Other Ambulatory Visit (INDEPENDENT_AMBULATORY_CARE_PROVIDER_SITE_OTHER)

## 2024-04-24 DIAGNOSIS — N1831 Chronic kidney disease, stage 3a: Secondary | ICD-10-CM

## 2024-04-24 LAB — BASIC METABOLIC PANEL WITH GFR
BUN: 30 mg/dL — ABNORMAL HIGH (ref 6–23)
CO2: 20 meq/L (ref 19–32)
Calcium: 8.8 mg/dL (ref 8.4–10.5)
Chloride: 108 meq/L (ref 96–112)
Creatinine, Ser: 1.34 mg/dL — ABNORMAL HIGH (ref 0.40–1.20)
GFR: 33.77 mL/min — ABNORMAL LOW (ref >60.00)
Glucose, Bld: 170 mg/dL — ABNORMAL HIGH (ref 70–99)
Potassium: 4.6 meq/L (ref 3.5–5.1)
Sodium: 138 meq/L (ref 135–145)

## 2024-05-20 ENCOUNTER — Ambulatory Visit (INDEPENDENT_AMBULATORY_CARE_PROVIDER_SITE_OTHER): Admitting: Urology

## 2024-05-20 VITALS — BP 172/81 | HR 72 | Ht 61.25 in | Wt 140.0 lb

## 2024-05-20 DIAGNOSIS — N289 Disorder of kidney and ureter, unspecified: Secondary | ICD-10-CM

## 2024-05-20 NOTE — Progress Notes (Signed)
 Elizabeth [name],acting as a scribe for Dustin Gimenez, MD.,have documented all relevant documentation on the behalf of Dustin Gimenez, MD,as directed by  Dustin Gimenez, MD while in the presence of Dustin Gimenez, MD.  05/20/24 3:28 PM   Earlene Gleason Coye [birth year] [id number]  Referring provider: Dellar Fenton, MD [address] [place],  [place] [address]  Chief Complaint  Patient presents with   renal mass    HPI: 89 year old female with a personal history of CKD presents today for consultation regarding an incidental renal mass.   The mass was discovered during a work-up for CKD and is described as a 2 cm hypoechoic lesion in the mid-pole of the right kidney. The radiologist recommended further imaging.  She also shares her family history of longevity, with her mother and grandmother both living to 16 years.   She has a history of bladder issues and has tried pessaries without success. She wears a pad for minor urinary incontinence.   PMH: Past Medical History:  Diagnosis Date   Cystocele or rectocele with incomplete uterine prolapse    Hyperlipidemia    Hypertension    Irritable bowel syndrome    Palpitations    Vaginal atrophy     Surgical History: Past Surgical History:  Procedure Laterality Date   CATARACT EXTRACTION  2006    Home Medications:  Allergies as of 05/20/2024   No Known Allergies      Medication List        Accurate as of May 20, 2024  3:28 PM. If you have any questions, ask your nurse or doctor.          amLODipine  2.5 MG tablet Commonly known as: NORVASC  Take 1 tablet (2.5 mg total) by mouth daily.   aspirin  81 MG tablet Take 81 mg by mouth daily.   atorvastatin  40 MG tablet Commonly known as: LIPITOR  Take 1 tablet (40 mg total) by mouth daily.   cetirizine 5 MG tablet Commonly known as: ZYRTEC Take 5 mg by mouth daily as needed.   lisinopril  40 MG tablet Commonly known as: ZESTRIL  Take 1 tablet (40  mg total) by mouth daily.   meclizine  12.5 MG tablet Commonly known as: ANTIVERT  Take 2 tablets (25 mg total) by mouth 3 (three) times daily as needed for dizziness.   metoprolol  tartrate 25 MG tablet Commonly known as: LOPRESSOR  Take 1 tablet (25 mg total) by mouth 2 (two) times daily.   triamcinolone 55 MCG/ACT nasal inhaler Commonly known as: NASACORT Place 2 sprays into the nose daily as needed.   Trimo-San 0.025 % Gel Generic drug: OXYQUINOLONE SULFATE VAGINAL Place 1 Applicatorful vaginally once a week.         Family History: Family History  Problem Relation Age of Onset   Transient ischemic attack Mother        multiple    Dementia Mother    Stroke Father    Heart disease Brother        MI - died age 74   Leukemia Brother    Lung cancer Brother    Throat cancer Brother        had heart disease also   Heart disease Sister    Dementia Sister     Social History:  reports that she has never smoked. She has never used smokeless tobacco. She reports that she does not drink alcohol and does not use drugs.   Physical Exam: BP (!) 172/81   Pulse 72  Ht 5' 1.25" (1.556 m)   Wt 140 lb (63.5 kg)   BMI 26.24 kg/m   Constitutional:  Alert and oriented, No acute distress. HEENT: [place] AT, moist mucus membranes.  Trachea midline, no masses. Neurologic: Grossly intact, no focal deficits, moving all 4 extremities. Psychiatric: Normal mood and affect.   Pertinent Imaging: EXAM: RENAL / URINARY TRACT ULTRASOUND COMPLETE  COMPARISON:  Abdomen ultrasound December 11, 2012  FINDINGS: Right Kidney:  Renal measurements: 8.9 x 3.8 x 4.1 cm = volume: 73.2 mL. Echogenicity within normal limits. No hydronephrosis visualized. There is a 3.1 x 3.5 x 2.8 cm simple cyst in the right kidney, no follow-up is recommended. There is a 2 x 1.5 x 1.9 cm hypoechoic mass in the midpole right kidney.  Left Kidney:  Renal measurements: 9.4 x 4.3 x 3.9 cm = volume: 81.8  mL. Echogenicity within normal limits. No hydronephrosis visualized. 1.3 x 0.9 x 1.2 cm midpole left kidney cyst is noted, no follow-up is recommended.  Bladder:  Appears normal for degree of bladder distention.  Other:  None.  IMPRESSION: 1. No acute abnormality identified. 2. 2 cm hypoechoic mass in the midpole right kidney. Recommend further evaluation with renal protocol CT or MRI.   Electronically Signed By: Anna Barnes M.D. On: 04/08/2024 16:17  This was personally reviewed and I agree with the radiologic interpretation.  Assessment & Plan:    1. Incidental Renal Mass - The renal mass is likely benign, possibly a complex cyst or a slow-growing mass. Comparison ton US  11/2012 shows 1.7 cm right midpole cyst, possible the same lesion in question.   -We discussed management of small renal masses.  If this is a malignancy, they are typically slow-growing and given her chronological age, which strongly recommend observation.  There is no definitive role for further characterization as we would not likely intervene.  She understands and is agreeable this plan. -Surveillance with a follow-up renal ultrasound in one year is advised to monitor for any changes in size or characteristics.  2. CKD - She was undergoing a work-up for CKD when the renal mass was discovered. No specific treatment plan for CKD was discussed during this visit.  Return in about 1 year (around 05/20/2025) for follow up with renal ultrasound.  I have reviewed the above documentation for accuracy and completeness, and I agree with the above.   Dustin Gimenez, MD   [facility] [address] [place], [place] [address] [phone]

## 2024-05-22 ENCOUNTER — Encounter: Payer: Self-pay | Admitting: Internal Medicine

## 2024-05-22 DIAGNOSIS — N2889 Other specified disorders of kidney and ureter: Secondary | ICD-10-CM | POA: Insufficient documentation

## 2024-05-27 ENCOUNTER — Other Ambulatory Visit: Payer: Self-pay | Admitting: Cardiovascular Disease

## 2024-06-12 ENCOUNTER — Ambulatory Visit: Admitting: *Deleted

## 2024-06-12 ENCOUNTER — Telehealth: Payer: Self-pay | Admitting: *Deleted

## 2024-06-12 ENCOUNTER — Ambulatory Visit
Admission: EM | Admit: 2024-06-12 | Discharge: 2024-06-12 | Disposition: A | Discharge status: Home / Self Care | Attending: Emergency Medicine | Admitting: Emergency Medicine

## 2024-06-12 VITALS — Ht 61.5 in | Wt 140.0 lb

## 2024-06-12 DIAGNOSIS — H109 Unspecified conjunctivitis: Secondary | ICD-10-CM | POA: Diagnosis not present

## 2024-06-12 DIAGNOSIS — Z0000 Encounter for general adult medical examination without abnormal findings: Secondary | ICD-10-CM

## 2024-06-12 MED ORDER — MOXIFLOXACIN HCL 0.5 % OP SOLN: 1.0000 [drp] | Freq: Three times a day (TID) | OPHTHALMIC | 0 refills | Status: AC

## 2024-06-12 NOTE — ED Triage Notes (Signed)
 Pt being seen in UC for R eye redness x3 days with some drainage in the morning. Pt reports using refresh eye drops with no relief. Pt denies pain.

## 2024-06-12 NOTE — Progress Notes (Signed)
 Subjective:   Elizabeth [name] is a 89 y.o. who presents for a Medicare Wellness preventive visit.  As a reminder, Annual Wellness Visits don't include a physical exam, and some assessments may be limited, especially if this visit is performed virtually. We may recommend an in-person follow-up visit with your provider if needed.  Visit Complete: Virtual I connected with  Elizabeth [name] on 06/12/24 by a audio enabled telemedicine application and verified that I am speaking with the correct person using two identifiers.  Patient Location: Home  Provider Location: Home Office  I discussed the limitations of evaluation and management by telemedicine. The patient expressed understanding and agreed to proceed.  Vital Signs: Because this visit was a virtual/telehealth visit, some criteria may be missing or patient reported. Any vitals not documented were not able to be obtained and vitals that have been documented are patient reported.  VideoDeclined- This patient declined Librarian, academic. Therefore the visit was completed with audio only.  Persons Participating in Visit: Patient.  AWV Questionnaire: No: Patient Medicare AWV questionnaire was not completed prior to this visit.  Cardiac Risk Factors include: advanced age (>52men, >82 women);dyslipidemia;hypertension;diabetes mellitus     Objective:    Today's Vitals   06/12/24 1011  Weight: 140 lb (63.5 kg)  Height: 5' 1.5 (1.562 m)   Body mass index is 26.02 kg/m.     06/12/2024   10:41 AM 10/02/2022    2:49 PM 10/26/2021    6:25 PM 10/25/2021    8:08 PM 09/19/2021   12:15 PM 02/06/2021    1:04 PM 09/16/2020   12:59 PM  Advanced Directives  Does Patient Have a Medical Advance Directive? Yes Yes Yes No Yes No Yes  Type of Estate agent of [place];Living will Healthcare Power of [place];Living will Living will;Healthcare Power of [employer] Power of [place];Living will   Healthcare Power of Attorney  Does patient want to make changes to medical advance directive? No - Patient declined No - Patient declined No - Patient declined  No - Patient declined No - Patient declined No - Patient declined  Copy of Healthcare Power of Attorney in Chart? Yes - validated most recent copy scanned in chart (See row information) Yes - validated most recent copy scanned in chart (See row information) Yes - validated most recent copy scanned in chart (See row information)  Yes - validated most recent copy scanned in chart (See row information)  No - copy requested    Current Medications (verified) Outpatient Encounter Medications as of 06/12/2024  Medication Sig   amLODipine  (NORVASC ) 2.5 MG tablet Take 1 tablet (2.5 mg total) by mouth daily.   aspirin  81 MG tablet Take 81 mg by mouth daily.   atorvastatin  (LIPITOR ) 40 MG tablet Take 1 tablet (40 mg total) by mouth daily.   cetirizine (ZYRTEC) 5 MG tablet Take 5 mg by mouth daily as needed.    lisinopril  (ZESTRIL ) 40 MG tablet Take 1 tablet (40 mg total) by mouth daily.   meclizine  (ANTIVERT ) 12.5 MG tablet Take 2 tablets (25 mg total) by mouth 3 (three) times daily as needed for dizziness.   metoprolol  tartrate (LOPRESSOR ) 25 MG tablet Take 1 tablet (25 mg total) by mouth 2 (two) times daily.   OXYQUINOLONE SULFATE VAGINAL (TRIMO-SAN) 0.025 % GEL Place 1 Applicatorful vaginally once a week.   triamcinolone (NASACORT) 55 MCG/ACT nasal inhaler Place 2 sprays into the nose daily as needed.    No  facility-administered encounter medications on file as of 06/12/2024.    Allergies (verified) Patient has no known allergies.   History: Past Medical History:  Diagnosis Date   Cystocele or rectocele with incomplete uterine prolapse    Hyperlipidemia    Hypertension    Irritable bowel syndrome    Palpitations    Vaginal atrophy    Past Surgical History:  Procedure Laterality Date   CATARACT EXTRACTION  2006   Family History   Problem Relation Age of Onset   Transient ischemic attack Mother        multiple    Dementia Mother    Stroke Father    Heart disease Brother        MI - died age 59   Leukemia Brother    Lung cancer Brother    Throat cancer Brother        had heart disease also   Heart disease Sister    Dementia Sister    Social History   Socioeconomic History   Marital status: Widowed    Spouse name: Not on file   Number of children: 2   Years of education: Not on file   Highest education level: Not on file  Occupational History   Not on file  Tobacco Use   Smoking status: Never   Smokeless tobacco: Never  Vaping Use   Vaping status: Never Used  Substance and Sexual Activity   Alcohol use: No    Alcohol/week: 0.0 standard drinks of alcohol   Drug use: No   Sexual activity: Not Currently    Birth control/protection: None  Other Topics Concern   Not on file  Social History Narrative   Not on file   Social Drivers of Health   Financial Resource Strain: Low Risk  (06/12/2024)   Overall Financial Resource Strain (CARDIA)    Difficulty of Paying Living Expenses: Not hard at all  Food Insecurity: No Food Insecurity (06/12/2024)   Hunger Vital Sign    Worried About Running Out of Food in the Last Year: Never true    Ran Out of Food in the Last Year: Never true  Transportation Needs: No Transportation Needs (06/12/2024)   PRAPARE - Administrator, Civil Service (Medical): No    Lack of Transportation (Non-Medical): No  Physical Activity: Insufficiently Active (06/12/2024)   Exercise Vital Sign    Days of Exercise per Week: 5 days    Minutes of Exercise per Session: 10 min  Stress: No Stress Concern Present (06/12/2024)   [employer] of Occupational Health - Occupational Stress Questionnaire    Feeling of Stress: Not at all  Social Connections: Socially Isolated (06/12/2024)   Social Connection and Isolation Panel    Frequency of Communication with Friends and  Family: More than three times a week    Frequency of Social Gatherings with Friends and Family: More than three times a week    Attends Religious Services: Never    Database administrator or Organizations: No    Attends Banker Meetings: Never    Marital Status: Widowed    Tobacco Counseling Counseling given: Not Answered    Clinical Intake:  Pre-visit preparation completed: Yes  Pain : No/denies pain     BMI - recorded: 26.02 Nutritional Status: BMI 25 -29 Overweight Nutritional Risks: None Diabetes: Yes CBG done?: No Did pt. bring in CBG monitor from home?: No  Lab Results  Component Value Date   HGBA1C 6.9 (H) 02/20/2024  HGBA1C 7.0 (H) 10/22/2023   HGBA1C 6.8 (H) 06/20/2023     How often do you need to have someone help you when you read instructions, pamphlets, or other written materials from your doctor or pharmacy?: 1 - Never  Interpreter Needed?: No  Information entered by :: R. Maxson Oddo LPN   Activities of Daily Living     06/12/2024   10:22 AM  In your present state of health, do you have any difficulty performing the following activities:  Hearing? 1  Comment wears aids  Vision? 0  Comment glasses  Difficulty concentrating or making decisions? 0  Walking or climbing stairs? 0  Dressing or bathing? 0  Doing errands, shopping? 0  Preparing Food and eating ? N  Using the Toilet? N  In the past six months, have you accidently leaked urine? N  Do you have problems with loss of bowel control? N  Managing your Medications? N  Managing your Finances? N  Housekeeping or managing your Housekeeping? N    Patient Care Team: Dellar Fenton, MD as PCP - General (Internal Medicine) Devorah Fonder, MD as PCP - Cardiology (Cardiology)  I have updated your Care Teams any recent Medical Services you may have received from other providers in the past year.     Assessment:   This is a routine wellness examination for  [place].  Hearing/Vision screen Hearing Screening - Comments:: Wears aids Vision Screening - Comments:: glasses   Goals Addressed             This Visit's Progress    Patient Stated       Wants to lose weight       Depression Screen     06/12/2024   10:36 AM 03/05/2024    2:38 PM 12/02/2023    3:14 PM 06/24/2023    2:17 PM 12/06/2022    1:25 PM 10/02/2022    2:42 PM 09/03/2022    5:07 PM  PHQ 2/9 Scores  PHQ - 2 Score 0 0 1 0 0 0 0  PHQ- 9 Score 1 2 2 2 2       Fall Risk     06/12/2024   10:26 AM 06/24/2023    2:17 PM 12/06/2022    1:25 PM 10/02/2022    2:42 PM 09/03/2022    5:07 PM  Fall Risk   Falls in the past year? 0 0 0 0 0  Number falls in past yr: 0 0 0 0 0  Injury with Fall? 0 0 0 0 0  Risk for fall due to : No Fall Risks No Fall Risks Impaired balance/gait  No Fall Risks  Follow up Falls evaluation completed;Falls prevention discussed Falls evaluation completed Falls evaluation completed  Falls evaluation completed  Falls evaluation completed      Data saved with a previous flowsheet row definition    MEDICARE RISK AT HOME:  Medicare Risk at Home Any stairs in or around the home?: No If so, are there any without handrails?: No Home free of loose throw rugs in walkways, pet beds, electrical cords, etc?: Yes Adequate lighting in your home to reduce risk of falls?: Yes Life alert?: Yes Use of a cane, walker or w/c?: No Grab bars in the bathroom?: Yes Shower chair or bench in shower?: Yes Elevated toilet seat or a handicapped toilet?: Yes  TIMED UP AND GO:  Was the test performed?  No  Cognitive Function: 6CIT completed    04/04/2021    7:10 AM  05/28/2018    8:52 AM  MMSE - Mini Mental State Exam  Orientation to time 4 5  Orientation to Place 5 5  Registration 3 3  Attention/ Calculation 5 5  Recall 3 3  Language- name 2 objects 2 2  Language- repeat 1 1  Language- follow 3 step command 3 3  Language- read & follow direction 1 1  Write a  sentence 1 1  Copy design 1 1  Total score 29 30        06/12/2024   10:41 AM 10/02/2022    2:59 PM 09/19/2021   12:35 PM 09/16/2019    1:02 PM 05/27/2017    8:36 AM  6CIT Screen  What Year? 0 points 0 points 0 points 0 points 0 points  What month? 0 points 0 points 0 points 0 points 0 points  What time? 0 points 0 points 0 points 0 points 0 points  Count back from 20 2 points 0 points  0 points 0 points  Months in reverse 0 points 0 points  0 points 0 points  Repeat phrase 0 points 0 points  0 points 0 points  Total Score 2 points 0 points  0 points 0 points    Immunizations Immunization History  Administered Date(s) Administered   Fluad Quad(high Dose 65+) 10/15/2019, 09/21/2021, 09/03/2022   Influenza Split 10/19/2009, 10/13/2013, 09/30/2014   Influenza, High Dose Seasonal PF 09/28/2016, 10/30/2017, 11/04/2018   Influenza-Unspecified 10/24/2015, 09/20/2020   PFIZER(Purple Top)SARS-COV-2 Vaccination 01/25/2020, 02/15/2020, 03/14/2021   Pneumococcal Conjugate-13 03/11/2014   Pneumococcal Polysaccharide-23 05/31/2017    Screening Tests Health Maintenance  Topic Date Due   Zoster Vaccines- Shingrix (1 of 2) Never done   DEXA SCAN  Never done   FOOT EXAM  06/19/2022   OPHTHALMOLOGY EXAM  12/14/2022   COVID-19 Vaccine (4 - 2024-25 season) 08/25/2023   Medicare Annual Wellness (AWV)  10/03/2023   INFLUENZA VACCINE  07/24/2024   HEMOGLOBIN A1C  08/19/2024   Pneumococcal Vaccine: 50+ Years  Completed   HPV VACCINES  Aged Out   Meningococcal B Vaccine  Aged Out   DTaP/Tdap/Td  Discontinued   MAMMOGRAM  Discontinued    Health Maintenance  Health Maintenance Due  Topic Date Due   Zoster Vaccines- Shingrix (1 of 2) Never done   DEXA SCAN  Never done   FOOT EXAM  06/19/2022   OPHTHALMOLOGY EXAM  12/14/2022   COVID-19 Vaccine (4 - 2024-25 season) 08/25/2023   Medicare Annual Wellness (AWV)  10/03/2023   Health Maintenance Items Addressed: Discussed the need to update  vaccines. Declines Dexa  Needs foot exam completed and documented at next office visit 06/25/24  Additional Screening:  Vision Screening: Recommended annual ophthalmology exams for early detection of glaucoma and other disorders of the eye. Up to date [facility] will request office notes Would you like a referral to an eye doctor? No    Dental Screening: Recommended annual dental exams for proper oral hygiene  Community Resource Referral / Chronic Care Management: CRR required this visit?  No   CCM required this visit?  No   Plan:    I have personally reviewed and noted the following in the patient's chart:   Medical and social history Use of alcohol, tobacco or illicit drugs  Current medications and supplements including opioid prescriptions. Patient is not currently taking opioid prescriptions. Functional ability and status Nutritional status Physical activity Advanced directives List of other physicians Hospitalizations, surgeries, and ER visits in previous  12 months Vitals Screenings to include cognitive, depression, and falls Referrals and appointments  In addition, I have reviewed and discussed with patient certain preventive protocols, quality metrics, and best practice recommendations. A written personalized care plan for preventive services as well as general preventive health recommendations were provided to patient.   Felicitas Horse, LPN   1/61/0960   After Visit Summary: (MyChart) Due to this being a telephonic visit, the after visit summary with patients personalized plan was offered to patient via MyChart   Notes: Nothing significant to report at this time. Phone note to Doc of Day

## 2024-06-12 NOTE — ED Provider Notes (Signed)
 [name]    CSN: [id number] Arrival date & time: 06/12/24  1147      History   Chief Complaint Chief Complaint  Patient presents with   Eye Problem    HPI Elizabeth [name] is a 89 y.o. female.   Patient presents for evaluation of right eye drainage primarily in the morning, redness and increased dryness present for 3 days.  Has attempted use of over-the-counter eye relief which has been ineffective.  Denies blurred vision, floaters, injury or trauma, light sensitivity.  Has had several contacts with similar symptoms.  Past Medical History:  Diagnosis Date   Cystocele or rectocele with incomplete uterine prolapse    Hyperlipidemia    Hypertension    Irritable bowel syndrome    Palpitations    Vaginal atrophy     Patient Active Problem List   Diagnosis Date Noted   Renal mass 05/22/2024   Diastolic dysfunction 10/27/2023   Balance problem 06/29/2023   Rhinitis 09/28/2022   Chronic eczematoid otitis externa 09/28/2022   B12 deficiency 12/29/2021   Vertigo 10/26/2021   Elevated troponin 10/26/2021   Leukocytosis 10/26/2021   Elevated lactic acid level 10/26/2021   Aortic atherosclerosis (HCC) 06/26/2021   Auditory hallucinations 04/10/2021   Right shoulder pain 10/02/2020   CKD (chronic kidney disease) stage 3, GFR 30-59 ml/min (HCC) 10/02/2020   Elevated TSH 10/02/2020   Weakness 05/29/2020   Type 2 diabetes mellitus with hyperglycemia (HCC) 01/07/2020   GERD (gastroesophageal reflux disease) 11/09/2018   Dizziness 06/02/2017   Vaginal atrophy 09/27/2016   SVT (supraventricular tachycardia) (HCC) 01/27/2016   Headache 01/26/2016   Left arm numbness 01/26/2016   Urethral caruncle 12/23/2015   Vaginal pessary in situ 09/22/2015   Health care maintenance 07/22/2015   Vaginal prolapse 06/26/2015   First degree uterine prolapse 06/26/2015   Hyperkalemia 04/27/2015   Arrhythmia 04/27/2015   SOB (shortness of breath) 04/27/2015   Double vision  07/18/2014   Decreased hearing 07/18/2014   Stress 07/18/2014   Fatigue 07/18/2014   Environmental allergies 07/09/2013   Hypertension 12/09/2012   Hypercholesterolemia 12/09/2012    Past Surgical History:  Procedure Laterality Date   CATARACT EXTRACTION  2006    OB History     Gravida  2   Para  2   Term  2   Preterm      AB      Living  2      SAB      IAB      Ectopic      Multiple      Live Births  2            Home Medications    Prior to Admission medications   Medication Sig Start Date End Date Taking? Authorizing Provider  moxifloxacin (VIGAMOX) 0.5 % ophthalmic solution Place 1 drop into the right eye 3 (three) times daily. 06/12/24  Yes Rosan Calbert R, NP  amLODipine  (NORVASC ) 2.5 MG tablet Take 1 tablet (2.5 mg total) by mouth daily. 08/12/23   Gollan, Timothy J, MD  aspirin  81 MG tablet Take 81 mg by mouth daily.    [provider]  atorvastatin  (LIPITOR ) 40 MG tablet Take 1 tablet (40 mg total) by mouth daily. 08/12/23   Gollan, Timothy J, MD  cetirizine (ZYRTEC) 5 MG tablet Take 5 mg by mouth daily as needed.  Patient not taking: Reported on 06/12/2024    [provider]  lisinopril  (ZESTRIL ) 40 MG tablet Take  1 tablet (40 mg total) by mouth daily. 08/12/23   Gollan, Timothy J, MD  meclizine  (ANTIVERT ) 12.5 MG tablet Take 2 tablets (25 mg total) by mouth 3 (three) times daily as needed for dizziness. 10/27/21   Adhikari, Amrit, MD  metoprolol  tartrate (LOPRESSOR ) 25 MG tablet Take 1 tablet (25 mg total) by mouth 2 (two) times daily. 08/12/23   Gollan, Timothy J, MD  OXYQUINOLONE SULFATE VAGINAL (TRIMO-SAN) 0.025 % GEL Place 1 Applicatorful vaginally once a week. Patient not taking: Reported on 06/12/2024 10/23/23   Teresa Fender, MD  triamcinolone (NASACORT) 55 MCG/ACT nasal inhaler Place 2 sprays into the nose daily as needed.     [provider]    Family History Family History  Problem Relation Age of Onset    Transient ischemic attack Mother        multiple    Dementia Mother    Stroke Father    Heart disease Brother        MI - died age 64   Leukemia Brother    Lung cancer Brother    Throat cancer Brother        had heart disease also   Heart disease Sister    Dementia Sister     Social History Social History   Tobacco Use   Smoking status: Never   Smokeless tobacco: Never  Vaping Use   Vaping status: Never Used  Substance Use Topics   Alcohol use: No    Alcohol/week: 0.0 standard drinks of alcohol   Drug use: No     Allergies   Patient has no known allergies.   Review of Systems Review of Systems   Physical Exam Triage Vital Signs ED Triage Vitals  Encounter Vitals Group     BP 06/12/24 1157 (!) 173/80     Girls Systolic BP Percentile --      Girls Diastolic BP Percentile --      Boys Systolic BP Percentile --      Boys Diastolic BP Percentile --      Pulse Rate 06/12/24 1157 70     Resp 06/12/24 1157 17     Temp 06/12/24 1157 98.1 F (36.7 C)     Temp Source 06/12/24 1157 Oral     SpO2 06/12/24 1157 97 %     Weight --      Height --      Head Circumference --      Peak Flow --      Pain Score 06/12/24 1159 0     Pain Loc --      Pain Education --      Exclude from Growth Chart --    No data found.  Updated Vital Signs BP (!) 173/80 (BP Location: Right Arm)   Pulse 70   Temp 98.1 F (36.7 C) (Oral)   Resp 17   SpO2 97%   Visual Acuity Right Eye Distance:   Left Eye Distance:   Bilateral Distance:    Right Eye Near:   Left Eye Near:    Bilateral Near:     Physical Exam Constitutional:      Appearance: Normal appearance.   Eyes:     Comments: Erythema to the conjunctiva and the sclera, no drainage noted on exam, vision grossly intact, extraocular movements intact  Pulmonary:     Effort: Pulmonary effort is normal.   Neurological:     Mental Status: She is alert and oriented to person, place, and time. Mental  status is at baseline.       UC Treatments / Results  Labs (all labs ordered are listed, but only abnormal results are displayed) Labs Reviewed - No data to display  EKG   Radiology No results found.  Procedures Procedures (including critical care time)  Medications Ordered in UC Medications - No data to display  Initial Impression / Assessment and Plan / UC Course  I have reviewed the triage vital signs and the nursing notes.  Pertinent labs & imaging results that were available during my care of the patient were reviewed by me and considered in my medical decision making (see chart for details).  Bacterial conjunctivitis of right eye  No drainage on exam however patient endorses in the morning therefore empirically treating with biotic drops, moxifloxacin prescribed and discussed administration, possible conjunctival hemorrhage, discussed that it is self-limiting and advised to monitor and if symptoms continue to persist follow-up with primary doctor, recommended additional supportive measures Final Clinical Impressions(s) / UC Diagnoses   Final diagnoses:  Bacterial conjunctivitis of right eye     Discharge Instructions      Today you being treated for bacterial conjunctivitis.   Place one drop of moxifloxacin into the effected eye every 8 hours while awake for 7 days. If the other eye starts to have symptoms you may use medication in it as well. Do not allow tip of dropper to touch eye.  May use cool compress for comfort and to remove discharge if present. Pat the eye, do not wipe.  If wearing contacts, dispose of current pair. Wear glasses until symptoms have resolved.   Do not rub eyes, this may cause more irritation.  May use benadryl  as needed to help if itching present.  Please avoid use of eye makeup until symptoms clear.  If symptoms persist after use of medication, please follow up at Urgent Care or with ophthalmologist (eye doctor)    ED Prescriptions     Medication  Sig Dispense Auth. Provider   moxifloxacin (VIGAMOX) 0.5 % ophthalmic solution Place 1 drop into the right eye 3 (three) times daily. 3 mL Reena Canning, NP      PDMP not reviewed this encounter.   Reena Canning, NP 06/12/24 1230

## 2024-06-12 NOTE — Telephone Encounter (Signed)
 Performed AWV FYI While on the phone with the patient she complained about redness in her right eye and it watering for the past 2-3 days. Patient stated that she has been around someone with pink eye.  Patient was advised that she needs to be seen because she may need some medication for her eye. No appointments at the office today. Patient stated that she lives only about 10 minutes from the office and does not want to go to an UC because of all of the sickness around. Patient was given information on the [facility] which Is close to the office. Patient was advised to wear a mask to protect herself. It appears that patient is at [facility] now.

## 2024-06-12 NOTE — Patient Instructions (Signed)
 Ms. Twichell , Thank you for taking time out of your busy schedule to complete your Annual Wellness Visit with me. I enjoyed our conversation and look forward to speaking with you again next year. I, as well as your care team,  appreciate your ongoing commitment to your health goals. Please review the following plan we discussed and let me know if I can assist you in the future. Your Game plan/ To Do List    Referrals: If you haven't heard from the office you've been referred to, please reach out to them at the phone provided.  Consider updating your shingles and covid vaccines Follow up Visits: Next Medicare AWV with our clinical staff: 06/14/25 @ 10:50   Have you seen your provider in the last 6 months (3 months if uncontrolled diabetes)? Yes Next Office Visit with your provider: 06/25/24  Clinician Recommendations:  Aim for 30 minutes of exercise or brisk walking, 6-8 glasses of water, and 5 servings of fruits and vegetables each day.       This is a list of the screening recommended for you and due dates:  Health Maintenance  Topic Date Due   Zoster (Shingles) Vaccine (1 of 2) Never done   DEXA scan (bone density measurement)  Never done   Complete foot exam   06/19/2022   Eye exam for diabetics  12/14/2022   COVID-19 Vaccine (4 - 2024-25 season) 08/25/2023   Flu Shot  07/24/2024   Hemoglobin A1C  08/19/2024   Medicare Annual Wellness Visit  06/12/2025   Pneumococcal Vaccine for age over 8  Completed   HPV Vaccine  Aged Out   Meningitis B Vaccine  Aged Out   DTaP/Tdap/Td vaccine  Discontinued   Mammogram  Discontinued    Advanced directives: (In Chart) A copy of your advanced directives are scanned into your chart should your provider ever need it. Advance Care Planning is important because it:  [x]  Makes sure you receive the medical care that is consistent with your values, goals, and preferences  [x]  It provides guidance to your family and loved ones and reduces their decisional  burden about whether or not they are making the right decisions based on your wishes.

## 2024-06-12 NOTE — Telephone Encounter (Signed)
 FYI pt was seen in UC today.

## 2024-06-12 NOTE — Discharge Instructions (Addendum)
 Today you being treated for bacterial conjunctivitis.   Place one drop of moxifloxacin into the effected eye every 8 hours while awake for 7 days. If the other eye starts to have symptoms you may use medication in it as well. Do not allow tip of dropper to touch eye.  May use cool compress for comfort and to remove discharge if present. Pat the eye, do not wipe.  If wearing contacts, dispose of current pair. Wear glasses until symptoms have resolved.   Do not rub eyes, this may cause more irritation.  May use benadryl as needed to help if itching present.  Please avoid use of eye makeup until symptoms clear.  If symptoms persist after use of medication, please follow up at Urgent Care or with ophthalmologist (eye doctor)

## 2024-06-22 ENCOUNTER — Other Ambulatory Visit: Payer: Self-pay

## 2024-06-22 ENCOUNTER — Other Ambulatory Visit (INDEPENDENT_AMBULATORY_CARE_PROVIDER_SITE_OTHER)

## 2024-06-22 DIAGNOSIS — E1165 Type 2 diabetes mellitus with hyperglycemia: Secondary | ICD-10-CM

## 2024-06-22 DIAGNOSIS — I10 Essential (primary) hypertension: Secondary | ICD-10-CM

## 2024-06-22 DIAGNOSIS — E7800 Pure hypercholesterolemia, unspecified: Secondary | ICD-10-CM

## 2024-06-22 LAB — LIPID PANEL
Cholesterol: 160 mg/dL (ref 0–200)
HDL: 42.7 mg/dL (ref >39.00)
LDL Cholesterol: 90 mg/dL (ref 0–99)
NonHDL: 117.6
Total CHOL/HDL Ratio: 4
Triglycerides: 138 mg/dL (ref 0.0–149.0)
VLDL: 27.6 mg/dL (ref 0.0–40.0)

## 2024-06-22 LAB — BASIC METABOLIC PANEL WITH GFR
BUN: 31 mg/dL — ABNORMAL HIGH (ref 6–23)
CO2: 18 meq/L — ABNORMAL LOW (ref 19–32)
Calcium: 8.9 mg/dL (ref 8.4–10.5)
Chloride: 108 meq/L (ref 96–112)
Creatinine, Ser: 1.35 mg/dL — ABNORMAL HIGH (ref 0.40–1.20)
GFR: 33.43 mL/min — ABNORMAL LOW (ref >60.00)
Glucose, Bld: 108 mg/dL — ABNORMAL HIGH (ref 70–99)
Potassium: 4.5 meq/L (ref 3.5–5.1)
Sodium: 137 meq/L (ref 135–145)

## 2024-06-22 LAB — HEPATIC FUNCTION PANEL
ALT: 21 U/L (ref 0–35)
AST: 23 U/L (ref 0–37)
Albumin: 4.2 g/dL (ref 3.5–5.2)
Alkaline Phosphatase: 65 U/L (ref 39–117)
Bilirubin, Direct: 0.1 mg/dL (ref 0.0–0.3)
Total Bilirubin: 0.5 mg/dL (ref 0.2–1.2)
Total Protein: 6.6 g/dL (ref 6.0–8.3)

## 2024-06-22 LAB — HEMOGLOBIN A1C: Hgb A1c MFr Bld: 6.9 % — ABNORMAL HIGH (ref 4.6–6.5)

## 2024-06-22 LAB — TSH: TSH: 3.48 u[IU]/mL (ref 0.35–5.50)

## 2024-06-23 ENCOUNTER — Ambulatory Visit: Payer: Self-pay | Admitting: Internal Medicine

## 2024-06-23 ENCOUNTER — Other Ambulatory Visit

## 2024-06-25 ENCOUNTER — Encounter: Payer: Self-pay | Admitting: Internal Medicine

## 2024-06-25 ENCOUNTER — Ambulatory Visit: Admitting: Internal Medicine

## 2024-06-25 VITALS — BP 130/78 | HR 75 | Temp 98.0°F | Resp 16 | Ht 61.5 in | Wt 143.2 lb

## 2024-06-25 DIAGNOSIS — N2889 Other specified disorders of kidney and ureter: Secondary | ICD-10-CM

## 2024-06-25 DIAGNOSIS — N1831 Chronic kidney disease, stage 3a: Secondary | ICD-10-CM

## 2024-06-25 DIAGNOSIS — F439 Reaction to severe stress, unspecified: Secondary | ICD-10-CM | POA: Diagnosis not present

## 2024-06-25 DIAGNOSIS — I10 Essential (primary) hypertension: Secondary | ICD-10-CM

## 2024-06-25 DIAGNOSIS — E1165 Type 2 diabetes mellitus with hyperglycemia: Secondary | ICD-10-CM

## 2024-06-25 DIAGNOSIS — I471 Supraventricular tachycardia, unspecified: Secondary | ICD-10-CM | POA: Diagnosis not present

## 2024-06-25 DIAGNOSIS — E7800 Pure hypercholesterolemia, unspecified: Secondary | ICD-10-CM

## 2024-06-25 DIAGNOSIS — I700 Atherosclerosis of aorta: Secondary | ICD-10-CM

## 2024-06-25 LAB — HM DIABETES FOOT EXAM

## 2024-06-25 NOTE — Progress Notes (Signed)
 Subjective:    Patient ID: Elizabeth [name], female    DOB: [birth year], 89 y.o.   MRN: [id number]  Patient here for  Chief Complaint  Patient presents with   Medical Management of Chronic Issues    HPI Here for a scheduled follow up - follow up regarding diabetes, hypertension and hypercholesterolemia. Going to pulmonary rehab. Saw urology 05/20/24 - evaluation renal mass. Recommended f/u renal ultrasound in one year. Tries to stay active. Still exercising. Overall feels from a cardiac standpoint - things are stable. Some allergy symptoms. Discussed (and has tried) multiple nasal sprays and antihistamines. No chest congestion or cough. No abdominal pain or bowel issues reported. Appears to be handling stress well.    Past Medical History:  Diagnosis Date   Cystocele or rectocele with incomplete uterine prolapse    Hyperlipidemia    Hypertension    Irritable bowel syndrome    Palpitations    Vaginal atrophy    Past Surgical History:  Procedure Laterality Date   CATARACT EXTRACTION  2006   Family History  Problem Relation Age of Onset   Transient ischemic attack Mother        multiple    Dementia Mother    Stroke Father    Heart disease Brother        MI - died age 55   Leukemia Brother    Lung cancer Brother    Throat cancer Brother        had heart disease also   Heart disease Sister    Dementia Sister    Social History   Socioeconomic History   Marital status: Widowed    Spouse name: Not on file   Number of children: 2   Years of education: Not on file   Highest education level: Not on file  Occupational History   Not on file  Tobacco Use   Smoking status: Never   Smokeless tobacco: Never  Vaping Use   Vaping status: Never Used  Substance and Sexual Activity   Alcohol use: No    Alcohol/week: 0.0 standard drinks of alcohol   Drug use: No   Sexual activity: Not Currently    Birth control/protection: None  Other Topics Concern   Not on file  Social  History Narrative   Not on file   Social Drivers of Health   Financial Resource Strain: Low Risk  (06/12/2024)   Overall Financial Resource Strain (CARDIA)    Difficulty of Paying Living Expenses: Not hard at all  Food Insecurity: No Food Insecurity (06/12/2024)   Hunger Vital Sign    Worried About Running Out of Food in the Last Year: Never true    Ran Out of Food in the Last Year: Never true  Transportation Needs: No Transportation Needs (06/12/2024)   PRAPARE - Administrator, Civil Service (Medical): No    Lack of Transportation (Non-Medical): No  Physical Activity: Insufficiently Active (06/12/2024)   Exercise Vital Sign    Days of Exercise per Week: 5 days    Minutes of Exercise per Session: 10 min  Stress: No Stress Concern Present (06/12/2024)   [employer] of Occupational Health - Occupational Stress Questionnaire    Feeling of Stress: Not at all  Social Connections: Socially Isolated (06/12/2024)   Social Connection and Isolation Panel    Frequency of Communication with Friends and Family: More than three times a week    Frequency of Social Gatherings with Friends and Family: More than three  times a week    Attends Religious Services: Never    Active Member of Clubs or Organizations: No    Attends Banker Meetings: Never    Marital Status: Widowed     Review of Systems  Constitutional:  Negative for appetite change and unexpected weight change.  HENT:  Positive for rhinorrhea. Negative for congestion and sinus pressure.   Respiratory:  Negative for cough, chest tightness and shortness of breath.   Cardiovascular:  Negative for chest pain and leg swelling.  Gastrointestinal:  Negative for abdominal pain, diarrhea, nausea and vomiting.  Genitourinary:  Negative for difficulty urinating and dysuria.  Musculoskeletal:  Negative for joint swelling and myalgias.  Skin:  Negative for color change and rash.  Neurological:  Negative for dizziness  and headaches.  Psychiatric/Behavioral:  Negative for agitation and dysphoric mood.        Objective:     BP 130/78   Pulse 75   Temp 98 F (36.7 C)   Resp 16   Ht 5' 1.5 (1.562 m)   Wt 143 lb 3.2 oz (65 kg)   SpO2 98%   BMI 26.62 kg/m  Wt Readings from Last 3 Encounters:  06/25/24 143 lb 3.2 oz (65 kg)  06/12/24 140 lb (63.5 kg)  05/20/24 140 lb (63.5 kg)    Physical Exam Vitals reviewed.  Constitutional:      General: She is not in acute distress.    Appearance: Normal appearance.  HENT:     Head: Normocephalic and atraumatic.     Right Ear: External ear normal.     Left Ear: External ear normal.     Mouth/Throat:     Pharynx: No oropharyngeal exudate or posterior oropharyngeal erythema.  Eyes:     General: No scleral icterus.       Right eye: No discharge.        Left eye: No discharge.     Conjunctiva/sclera: Conjunctivae normal.  Neck:     Thyroid : No thyromegaly.  Cardiovascular:     Rate and Rhythm: Normal rate and regular rhythm.  Pulmonary:     Effort: No respiratory distress.     Breath sounds: Normal breath sounds. No wheezing.  Abdominal:     General: Bowel sounds are normal.     Palpations: Abdomen is soft.     Tenderness: There is no abdominal tenderness.  Musculoskeletal:        General: No swelling or tenderness.     Cervical back: Neck supple. No tenderness.  Lymphadenopathy:     Cervical: No cervical adenopathy.  Skin:    Findings: No erythema or rash.  Neurological:     Mental Status: She is alert.  Psychiatric:        Mood and Affect: Mood normal.        Behavior: Behavior normal.         Outpatient Encounter Medications as of 06/25/2024  Medication Sig   amLODipine  (NORVASC ) 2.5 MG tablet Take 1 tablet (2.5 mg total) by mouth daily.   aspirin  81 MG tablet Take 81 mg by mouth daily.   atorvastatin  (LIPITOR ) 40 MG tablet Take 1 tablet (40 mg total) by mouth daily.   cetirizine (ZYRTEC) 5 MG tablet Take 5 mg by mouth daily as  needed.  (Patient not taking: Reported on 06/12/2024)   lisinopril  (ZESTRIL ) 40 MG tablet Take 1 tablet (40 mg total) by mouth daily.   meclizine  (ANTIVERT ) 12.5 MG tablet Take 2 tablets (25 mg  total) by mouth 3 (three) times daily as needed for dizziness.   metoprolol  tartrate (LOPRESSOR ) 25 MG tablet Take 1 tablet (25 mg total) by mouth 2 (two) times daily.   moxifloxacin  (VIGAMOX ) 0.5 % ophthalmic solution Place 1 drop into the right eye 3 (three) times daily.   OXYQUINOLONE SULFATE VAGINAL (TRIMO-SAN) 0.025 % GEL Place 1 Applicatorful vaginally once a week. (Patient not taking: Reported on 06/12/2024)   triamcinolone (NASACORT) 55 MCG/ACT nasal inhaler Place 2 sprays into the nose daily as needed.    No facility-administered encounter medications on file as of 06/25/2024.     Lab Results  Component Value Date   WBC 7.8 02/20/2024   HGB 14.5 02/20/2024   HCT 44.8 02/20/2024   PLT 247.0 02/20/2024   GLUCOSE 108 (H) 06/22/2024   CHOL 160 06/22/2024   TRIG 138.0 06/22/2024   HDL 42.70 06/22/2024   LDLDIRECT 201.0 03/07/2023   LDLCALC 90 06/22/2024   ALT 21 06/22/2024   AST 23 06/22/2024   NA 137 06/22/2024   K 4.5 06/22/2024   CL 108 06/22/2024   CREATININE 1.35 (H) 06/22/2024   BUN 31 (H) 06/22/2024   CO2 18 (L) 06/22/2024   TSH 3.48 06/22/2024   HGBA1C 6.9 (H) 06/22/2024       Assessment & Plan:  Type 2 diabetes mellitus with hyperglycemia, without long-term current use of insulin (HCC) Assessment & Plan: Low carb diet and exercise. Discussed recent labs. Follow met b and A1c.  Lab Results  Component Value Date   HGBA1C 6.9 (H) 06/22/2024      SVT (supraventricular tachycardia) (HCC) Assessment & Plan: Followed by cardiology. On lopressor . Stable.    Stress Assessment & Plan: Overall appears to be handling things relatively well. Has family support. Follow.    Renal mass Assessment & Plan: Saw urology - Dr Penne 05/22/24.  Recommended f/u renal ultrasound in  one year   Primary hypertension Assessment & Plan: Continue lisinopril , amlodipine  and metoprolol . Blood pressure as outlined. Hold on making changes in medication. Blood pressure as outlined. Follow pressures. Follow metabolic panel.    Hypercholesterolemia Assessment & Plan: Low cholesterol diet and exercise. Follow lipid panel and liver function tests.  Continues on lipitor .  Lab Results  Component Value Date   CHOL 160 06/22/2024   HDL 42.70 06/22/2024   LDLCALC 90 06/22/2024   LDLDIRECT 201.0 03/07/2023   TRIG 138.0 06/22/2024   CHOLHDL 4 06/22/2024      Stage 3a chronic kidney disease (HCC) Assessment & Plan: Avoid antiinflammatory medication. Stay hydrated. Follow metabolic panel. Recent GFR stable - 33. Follow.    Aortic atherosclerosis (HCC) Assessment & Plan: Continue lipitor  and aspirin .       Allena Hamilton, MD

## 2024-06-27 NOTE — Assessment & Plan Note (Signed)
Continue lipitor and aspirin.  ?

## 2024-06-27 NOTE — Assessment & Plan Note (Signed)
 Low carb diet and exercise. Discussed recent labs. Follow met b and A1c.  Lab Results  Component Value Date   HGBA1C 6.9 (H) 06/22/2024

## 2024-06-27 NOTE — Assessment & Plan Note (Signed)
 Low cholesterol diet and exercise. Follow lipid panel and liver function tests.  Continues on lipitor .  Lab Results  Component Value Date   CHOL 160 06/22/2024   HDL 42.70 06/22/2024   LDLCALC 90 06/22/2024   LDLDIRECT 201.0 03/07/2023   TRIG 138.0 06/22/2024   CHOLHDL 4 06/22/2024

## 2024-06-27 NOTE — Assessment & Plan Note (Signed)
 Continue lisinopril , amlodipine  and metoprolol . Blood pressure as outlined. Hold on making changes in medication. Blood pressure as outlined. Follow pressures. Follow metabolic panel.

## 2024-06-27 NOTE — Assessment & Plan Note (Signed)
 Avoid antiinflammatory medication. Stay hydrated. Follow metabolic panel. Recent GFR stable - 33. Follow.

## 2024-06-27 NOTE — Assessment & Plan Note (Signed)
 Followed by cardiology. On lopressor . Stable.

## 2024-06-27 NOTE — Assessment & Plan Note (Signed)
 Overall appears to be handling things relatively well. Has family support. Follow.

## 2024-06-27 NOTE — Assessment & Plan Note (Signed)
 Saw urology - Dr Penne 05/22/24.  Recommended f/u renal ultrasound in one year

## 2024-08-12 ENCOUNTER — Other Ambulatory Visit: Payer: Self-pay | Admitting: Cardiovascular Disease

## 2024-08-14 ENCOUNTER — Other Ambulatory Visit: Payer: Self-pay | Admitting: Cardiovascular Disease

## 2024-09-10 ENCOUNTER — Other Ambulatory Visit: Payer: Self-pay | Admitting: Cardiovascular Disease

## 2024-10-12 ENCOUNTER — Other Ambulatory Visit: Payer: Self-pay | Admitting: Cardiovascular Disease

## 2024-10-13 ENCOUNTER — Other Ambulatory Visit: Payer: Self-pay

## 2024-10-13 ENCOUNTER — Telehealth: Payer: Self-pay | Admitting: Cardiovascular Disease

## 2024-10-13 MED ORDER — LISINOPRIL 40 MG PO TABS: 40.0000 mg | ORAL_TABLET | Freq: Every day | ORAL | 0 refills | Status: DC

## 2024-10-13 NOTE — Telephone Encounter (Signed)
 RX sent in

## 2024-10-13 NOTE — Telephone Encounter (Signed)
*  STAT* If patient is at the pharmacy, call can be transferred to refill team.   1. Which medications need to be refilled? (please list name of each medication and dose if known) 30 lisinopril  40 mg   2. Would you like to learn more about the convenience, safety, & potential cost savings by using the [facility]? **no*     3. Are you open to using the [facility] (Type [facility]. no no 4. Which pharmacy/location (including street and city if local pharmacy) is medication to be sent to?[facility]  [address], [place]   5. Do they need a 30 day or 90 day supply? 90 day

## 2024-10-19 NOTE — Progress Notes (Unsigned)
 Cardiology Office Note  Date:  10/20/2024   ID:  Elizabeth [name], DOB 08-Jan-1927, MRN [id number]  PCP:  Glendia Shad, MD   Chief Complaint  Patient presents with   12 month follow up    The patient denies SOB today. Patient c/o dizziness & feels faint and has fluttering in chest at times.     HPI:  Elizabeth [name] is an 89 year old woman with history of  hypertension,  hyperlipidemia  Rare episodes of near syncope, no arrhythmia on monitor Chronic renal failure stage III Chronic dizziness, vertigo/vestibular issue who presents for follow-up of her arrhythmia, lightheadedness, Hypertension  LOV with myself 8/24 Lives at [facility], independent living Weight stable 142 pounds Reports having dizziness, faint at times, chest fluttering Walks a lot, participating in PT  Prior Zio monitors November 2020, December 2019  Has hearing aides, feels dizziness vestibular Good days and bad days, does not feel she needs a rollator or cane No falls but feels her balance is steady  hospital 11/22, dizziness CT head negative for intracranial normalities.  MRI was negative for stroke.TNT low, stable Was seen by neurology at that time, told she had vestibular issues  Labs reviewed CR 1.35 BUN 31 A1C 6.9 Total cholesterol 160 LDL 90  Adjusting to loss of husband 6 years ago  Echocardiogram 01/19/21 with LVEF 55-60%, grade 1 diastolic dysfunction, no RWMA, normal PASP, mild MR, mild AI   EKG personally reviewed by myself on todays visit EKG Interpretation Date/Time:  Tuesday October 20 2024 13:56:11 EDT Ventricular Rate:  69 PR Interval:  178 QRS Duration:  74 QT Interval:  404 QTC Calculation: 432 R Axis:   -34  Text Interpretation: Normal sinus rhythm Left axis deviation Septal infarct (cited on or before 31-Jul-2019) When compared with ECG of 12-Aug-2023 15:30, No significant change was found Confirmed by Perla Lye [id number]) on 10/20/2024 1:57:59 PM   emergency room August  2020 for chest pain, ruled out, felt to be atypical in nature  On last clinic visit November 2020 was walking on the treadmill Continued medication noncompliance Continued problems with anxiety  ZIO monitor with rare short runs of tachycardia SVT/atrial tach longest 12 seconds Average heart rate 64 bpm No change in monitor compares to 11/2018  Labs reviewed Total chol 151, LDL 81  previous episode of severe tachycardia , persisted longer than other episodes ,  Seem to go away on its own without intervention.  She does have significant stress at home, taking care of her husband. She is the primary caretaker    long history of short runs of palpitations, she has had these for 20 years  previously was using the treadmill 4-5 times per week She tried a lower dose of benazepril, reported her systolic pressure was 170 in the evenings on 20 mg daily, went back to 40 mg daily   PMH:   has a past medical history of Cystocele or rectocele with incomplete uterine prolapse, Hyperlipidemia, Hypertension, Irritable bowel syndrome, Palpitations, and Vaginal atrophy.  PSH:    Past Surgical History:  Procedure Laterality Date   CATARACT EXTRACTION  2006    Current Outpatient Medications  Medication Sig Dispense Refill   amLODipine  (NORVASC ) 2.5 MG tablet Take 1 tablet (2.5 mg total) by mouth daily. 90 tablet 3   aspirin  81 MG tablet Take 81 mg by mouth daily.     atorvastatin  (LIPITOR ) 40 MG tablet TAKE 1 TABLET BY MOUTH ONCE EVERY EVENING 90 tablet 0   cetirizine (ZYRTEC)  5 MG tablet Take 5 mg by mouth daily as needed.      lisinopril  (ZESTRIL ) 40 MG tablet Take 1 tablet (40 mg total) by mouth daily. 90 tablet 0   meclizine  (ANTIVERT ) 12.5 MG tablet Take 2 tablets (25 mg total) by mouth 3 (three) times daily as needed for dizziness. 30 tablet 0   metoprolol  tartrate (LOPRESSOR ) 25 MG tablet TAKE 1 TABLET BY MOUTH TWICE DAILY 180 tablet 0   moxifloxacin  (VIGAMOX ) 0.5 % ophthalmic solution Place  1 drop into the right eye 3 (three) times daily. 3 mL 0   triamcinolone (NASACORT) 55 MCG/ACT nasal inhaler Place 2 sprays into the nose daily as needed.      OXYQUINOLONE SULFATE VAGINAL (TRIMO-SAN) 0.025 % GEL Place 1 Applicatorful vaginally once a week. (Patient not taking: Reported on 10/20/2024) 113.4 g 3   No current facility-administered medications for this visit.    Allergies:   Patient has no known allergies.   Social History:  The patient  reports that she has never smoked. She has never used smokeless tobacco. She reports that she does not drink alcohol and does not use drugs.   Family History:   family history includes Atrial fibrillation in her brother; Dementia in her mother and sister; Heart disease in her brother and sister; Leukemia in her brother; Lung cancer in her brother; Stroke in her father; Throat cancer in her brother; Transient ischemic attack in her mother.    Review of Systems: Review of Systems  Constitutional: Negative.   HENT: Negative.    Respiratory: Negative.    Cardiovascular: Negative.   Gastrointestinal: Negative.   Musculoskeletal: Negative.   Neurological: Negative.   Psychiatric/Behavioral: Negative.    All other systems reviewed and are negative.   PHYSICAL EXAM: VS:  BP 120/72 (BP Location: Right Arm, Patient Position: Sitting, Cuff Size: Normal)   Pulse 69   Ht 5' 1.5 (1.562 m)   Wt 142 lb (64.4 kg)   SpO2 95%   BMI 26.40 kg/m  , BMI Body mass index is 26.4 kg/m. Constitutional:  oriented to person, place, and time. No distress.  HENT:  Head: Grossly normal Eyes:  no discharge. No scleral icterus.  Neck: No JVD, no carotid bruits  Cardiovascular: Regular rate and rhythm, no murmurs appreciated Pulmonary/Chest: Clear to auscultation bilaterally, no wheezes or rales Abdominal: Soft.  no distension.  no tenderness.  Musculoskeletal: Normal range of motion Neurological:  normal muscle tone. Coordination normal. No atrophy Skin:  Skin warm and dry Psychiatric: normal affect, pleasant  Recent Labs: 02/20/2024: Hemoglobin 14.5; Platelets 247.0 06/22/2024: ALT 21; BUN 31; Creatinine, Ser 1.35; Potassium 4.5; Sodium 137; TSH 3.48    Lipid Panel Lab Results  Component Value Date   CHOL 160 06/22/2024   HDL 42.70 06/22/2024   LDLCALC 90 06/22/2024   TRIG 138.0 06/22/2024     Wt Readings from Last 3 Encounters:  10/20/24 142 lb (64.4 kg)  06/25/24 143 lb 3.2 oz (65 kg)  06/12/24 140 lb (63.5 kg)    ASSESSMENT AND PLAN:  Cardiac arrhythmia, unspecified cardiac arrhythmia type  No significant arrhythmia on prior monitors Normal EKG Stable symptoms  Essential hypertension -  Blood pressure is well controlled on today's visit. No changes made to the medications.  Hypercholesterolemia Cholesterol is at goal on the current lipid regimen. No changes to the medications were made.  Stress Significant anxiety, stress, states she has not made close friends at [facility] Lost husband 6 years ago  Managed by PMD    Orders Placed This Encounter  Procedures   EKG 12-Lead     Signed, Velinda Lunger, M.D., Ph.D. 10/20/2024  [facility] [place], [place] [phone]

## 2024-10-20 ENCOUNTER — Ambulatory Visit: Attending: Cardiovascular Disease | Admitting: Cardiovascular Disease

## 2024-10-20 ENCOUNTER — Encounter: Payer: Self-pay | Admitting: Cardiovascular Disease

## 2024-10-20 VITALS — BP 120/72 | HR 69 | Ht 61.5 in | Wt 142.0 lb

## 2024-10-20 DIAGNOSIS — R7989 Other specified abnormal findings of blood chemistry: Secondary | ICD-10-CM

## 2024-10-20 DIAGNOSIS — N1832 Chronic kidney disease, stage 3b: Secondary | ICD-10-CM

## 2024-10-20 DIAGNOSIS — I872 Venous insufficiency (chronic) (peripheral): Secondary | ICD-10-CM

## 2024-10-20 DIAGNOSIS — I5032 Chronic diastolic (congestive) heart failure: Secondary | ICD-10-CM

## 2024-10-20 DIAGNOSIS — I10 Essential (primary) hypertension: Secondary | ICD-10-CM

## 2024-10-20 DIAGNOSIS — I471 Supraventricular tachycardia, unspecified: Secondary | ICD-10-CM

## 2024-10-20 DIAGNOSIS — I700 Atherosclerosis of aorta: Secondary | ICD-10-CM

## 2024-10-20 DIAGNOSIS — E782 Mixed hyperlipidemia: Secondary | ICD-10-CM

## 2024-10-20 MED ORDER — METOPROLOL TARTRATE 25 MG PO TABS: 25.0000 mg | ORAL_TABLET | Freq: Two times a day (BID) | ORAL | 3 refills | Status: AC

## 2024-10-20 MED ORDER — LISINOPRIL 40 MG PO TABS: 40.0000 mg | ORAL_TABLET | Freq: Every day | ORAL | 3 refills | Status: AC

## 2024-10-20 MED ORDER — ATORVASTATIN CALCIUM 40 MG PO TABS: 40.0000 mg | ORAL_TABLET | Freq: Every day | ORAL | 3 refills | Status: AC

## 2024-10-20 MED ORDER — AMLODIPINE BESYLATE 2.5 MG PO TABS: 2.5000 mg | ORAL_TABLET | Freq: Every day | ORAL | 3 refills | Status: AC

## 2024-10-20 NOTE — Patient Instructions (Addendum)

## 2024-10-28 ENCOUNTER — Other Ambulatory Visit

## 2024-10-28 ENCOUNTER — Other Ambulatory Visit: Payer: Self-pay | Admitting: *Deleted

## 2024-10-28 DIAGNOSIS — E1165 Type 2 diabetes mellitus with hyperglycemia: Secondary | ICD-10-CM | POA: Diagnosis not present

## 2024-10-28 DIAGNOSIS — E7800 Pure hypercholesterolemia, unspecified: Secondary | ICD-10-CM

## 2024-10-28 LAB — LIPID PANEL
Cholesterol: 171 mg/dL (ref 0–200)
HDL: 45.5 mg/dL (ref >39.00)
LDL Cholesterol: 85 mg/dL (ref 0–99)
NonHDL: 125.93
Total CHOL/HDL Ratio: 4
Triglycerides: 207 mg/dL — ABNORMAL HIGH (ref 0.0–149.0)
VLDL: 41.4 mg/dL — ABNORMAL HIGH (ref 0.0–40.0)

## 2024-10-28 LAB — HEPATIC FUNCTION PANEL
ALT: 17 U/L (ref 0–35)
AST: 18 U/L (ref 0–37)
Albumin: 4.3 g/dL (ref 3.5–5.2)
Alkaline Phosphatase: 64 U/L (ref 39–117)
Bilirubin, Direct: 0.1 mg/dL (ref 0.0–0.3)
Total Bilirubin: 0.5 mg/dL (ref 0.2–1.2)
Total Protein: 6.4 g/dL (ref 6.0–8.3)

## 2024-10-28 LAB — BASIC METABOLIC PANEL WITH GFR
BUN: 31 mg/dL — ABNORMAL HIGH (ref 6–23)
CO2: 22 meq/L (ref 19–32)
Calcium: 9 mg/dL (ref 8.4–10.5)
Chloride: 106 meq/L (ref 96–112)
Creatinine, Ser: 1.41 mg/dL — ABNORMAL HIGH (ref 0.40–1.20)
GFR: 31.65 mL/min — ABNORMAL LOW (ref >60.00)
Glucose, Bld: 98 mg/dL (ref 70–99)
Potassium: 4.7 meq/L (ref 3.5–5.1)
Sodium: 138 meq/L (ref 135–145)

## 2024-10-28 LAB — HEMOGLOBIN A1C: Hgb A1c MFr Bld: 6.9 % — ABNORMAL HIGH (ref 4.6–6.5)

## 2024-10-29 ENCOUNTER — Ambulatory Visit: Payer: Self-pay | Admitting: Internal Medicine

## 2024-10-30 ENCOUNTER — Encounter: Payer: Self-pay | Admitting: Internal Medicine

## 2024-10-30 ENCOUNTER — Ambulatory Visit: Admitting: Internal Medicine

## 2024-10-30 VITALS — BP 112/60 | HR 66 | Temp 97.9°F | Ht 61.5 in | Wt 142.0 lb

## 2024-10-30 DIAGNOSIS — E7800 Pure hypercholesterolemia, unspecified: Secondary | ICD-10-CM | POA: Diagnosis not present

## 2024-10-30 DIAGNOSIS — I471 Supraventricular tachycardia, unspecified: Secondary | ICD-10-CM | POA: Diagnosis not present

## 2024-10-30 DIAGNOSIS — J310 Chronic rhinitis: Secondary | ICD-10-CM

## 2024-10-30 DIAGNOSIS — Z23 Encounter for immunization: Secondary | ICD-10-CM | POA: Diagnosis not present

## 2024-10-30 DIAGNOSIS — N2889 Other specified disorders of kidney and ureter: Secondary | ICD-10-CM

## 2024-10-30 DIAGNOSIS — R42 Dizziness and giddiness: Secondary | ICD-10-CM

## 2024-10-30 DIAGNOSIS — I10 Essential (primary) hypertension: Secondary | ICD-10-CM

## 2024-10-30 DIAGNOSIS — F439 Reaction to severe stress, unspecified: Secondary | ICD-10-CM

## 2024-10-30 DIAGNOSIS — E1165 Type 2 diabetes mellitus with hyperglycemia: Secondary | ICD-10-CM | POA: Diagnosis not present

## 2024-10-30 DIAGNOSIS — I5189 Other ill-defined heart diseases: Secondary | ICD-10-CM

## 2024-10-30 DIAGNOSIS — N1831 Chronic kidney disease, stage 3a: Secondary | ICD-10-CM

## 2024-10-30 NOTE — Progress Notes (Signed)
 Subjective:    Patient ID: Elizabeth [name], female    DOB: March 31, 1927, 89 y.o.   MRN: [id number]  Patient here for  Chief Complaint  Patient presents with   Medical Management of Chronic Issues    4 mth f/u    HPI Here for a scheduled follow up - follow up regarding diabetes, hypertension and hypercholesterolemia. Going to pulmonary rehab. Saw urology 05/20/24 - evaluation renal mass. Recommended f/u renal ultrasound in one year. Saw Dr Gollan 10/20/24 - f/u hypertension and hypercholesterolemia. Reported lightheadedness. Reported some intermittent dizziness. Evaluated hospital 11/22 - dizziness. CT head negative for intracranial abnormalities and MRI negative. Evaluated by neurology. Told had vestibular issues. EKG - no significant change from EKG 07/2023.  Felt stable. No change in medication. Discussed today. Overall feels is stable. Does report intermittent dizziness. Took dramamine this am. Feeling better now. Some allergy issues.  No headache. Breathing stable. Eating and drinking.   Past Surgical History:  Procedure Laterality Date   CATARACT EXTRACTION  2006   Family History  Problem Relation Age of Onset   Transient ischemic attack Mother        multiple    Dementia Mother    Stroke Father    Heart disease Sister    Dementia Sister    Atrial fibrillation Brother    Heart disease Brother        MI - died age 55   Leukemia Brother    Lung cancer Brother    Throat cancer Brother        had heart disease also   Social History   Socioeconomic History   Marital status: Widowed    Spouse name: Not on file   Number of children: 2   Years of education: Not on file   Highest education level: Not on file  Occupational History   Not on file  Tobacco Use   Smoking status: Never   Smokeless tobacco: Never  Vaping Use   Vaping status: Never Used  Substance and Sexual Activity   Alcohol use: No    Alcohol/week: 0.0 standard drinks of alcohol   Drug use: No   Sexual  activity: Not Currently    Birth control/protection: None  Other Topics Concern   Not on file  Social History Narrative   Not on file   Social Drivers of Health   Financial Resource Strain: Low Risk  (06/12/2024)   Overall Financial Resource Strain (CARDIA)    Difficulty of Paying Living Expenses: Not hard at all  Food Insecurity: No Food Insecurity (06/12/2024)   Hunger Vital Sign    Worried About Running Out of Food in the Last Year: Never true    Ran Out of Food in the Last Year: Never true  Transportation Needs: No Transportation Needs (06/12/2024)   PRAPARE - Administrator, Civil Service (Medical): No    Lack of Transportation (Non-Medical): No  Physical Activity: Insufficiently Active (06/12/2024)   Exercise Vital Sign    Days of Exercise per Week: 5 days    Minutes of Exercise per Session: 10 min  Stress: No Stress Concern Present (06/12/2024)   [employer] of Occupational Health - Occupational Stress Questionnaire    Feeling of Stress: Not at all  Social Connections: Socially Isolated (06/12/2024)   Social Connection and Isolation Panel    Frequency of Communication with Friends and Family: More than three times a week    Frequency of Social Gatherings with Friends and Family:  More than three times a week    Attends Religious Services: Never    Active Member of Clubs or Organizations: No    Attends Banker Meetings: Never    Marital Status: Widowed     Review of Systems  Constitutional:  Negative for appetite change and unexpected weight change.  HENT:  Negative for sinus pressure.   Respiratory:  Negative for cough, chest tightness and shortness of breath.   Cardiovascular:  Negative for chest pain and palpitations.  Gastrointestinal:  Negative for abdominal pain, diarrhea and vomiting.  Genitourinary:  Negative for difficulty urinating and dysuria.  Musculoskeletal:  Negative for joint swelling and myalgias.  Skin:  Negative for color  change and rash.  Neurological:  Positive for dizziness. Negative for headaches.  Psychiatric/Behavioral:  Negative for agitation and dysphoric mood.        Objective:     BP 112/60   Pulse 66   Temp 97.9 F (36.6 C) (Oral)   Ht 5' 1.5 (1.562 m)   Wt 142 lb (64.4 kg)   SpO2 97%   BMI 26.40 kg/m  Wt Readings from Last 3 Encounters:  10/30/24 142 lb (64.4 kg)  10/20/24 142 lb (64.4 kg)  06/25/24 143 lb 3.2 oz (65 kg)    Physical Exam Vitals reviewed.  Constitutional:      General: She is not in acute distress.    Appearance: Normal appearance.  HENT:     Head: Normocephalic and atraumatic.     Right Ear: External ear normal.     Left Ear: External ear normal.     Mouth/Throat:     Pharynx: No oropharyngeal exudate or posterior oropharyngeal erythema.  Eyes:     General: No scleral icterus.       Right eye: No discharge.        Left eye: No discharge.     Conjunctiva/sclera: Conjunctivae normal.  Neck:     Thyroid : No thyromegaly.  Cardiovascular:     Rate and Rhythm: Normal rate and regular rhythm.  Pulmonary:     Effort: No respiratory distress.     Breath sounds: Normal breath sounds. No wheezing.  Abdominal:     General: Bowel sounds are normal.     Palpations: Abdomen is soft.     Tenderness: There is no abdominal tenderness.  Musculoskeletal:        General: No swelling or tenderness.     Cervical back: Neck supple. No tenderness.  Lymphadenopathy:     Cervical: No cervical adenopathy.  Skin:    Findings: No erythema or rash.  Neurological:     Mental Status: She is alert.  Psychiatric:        Mood and Affect: Mood normal.        Behavior: Behavior normal.         Outpatient Encounter Medications as of 10/30/2024  Medication Sig   amLODipine  (NORVASC ) 2.5 MG tablet Take 1 tablet (2.5 mg total) by mouth daily.   aspirin  81 MG tablet Take 81 mg by mouth daily.   atorvastatin  (LIPITOR ) 40 MG tablet Take 1 tablet (40 mg total) by mouth daily.    cetirizine (ZYRTEC) 5 MG tablet Take 5 mg by mouth daily as needed.    lisinopril  (ZESTRIL ) 40 MG tablet Take 1 tablet (40 mg total) by mouth daily.   meclizine  (ANTIVERT ) 12.5 MG tablet Take 2 tablets (25 mg total) by mouth 3 (three) times daily as needed for dizziness.   metoprolol   tartrate (LOPRESSOR ) 25 MG tablet Take 1 tablet (25 mg total) by mouth 2 (two) times daily.   moxifloxacin  (VIGAMOX ) 0.5 % ophthalmic solution Place 1 drop into the right eye 3 (three) times daily.   triamcinolone (NASACORT) 55 MCG/ACT nasal inhaler Place 2 sprays into the nose daily as needed.    [DISCONTINUED] OXYQUINOLONE SULFATE VAGINAL (TRIMO-SAN) 0.025 % GEL Place 1 Applicatorful vaginally once a week. (Patient not taking: Reported on 10/20/2024)   No facility-administered encounter medications on file as of 10/30/2024.     Lab Results  Component Value Date   WBC 7.8 02/20/2024   HGB 14.5 02/20/2024   HCT 44.8 02/20/2024   PLT 247.0 02/20/2024   GLUCOSE 98 10/28/2024   CHOL 171 10/28/2024   TRIG 207.0 (H) 10/28/2024   HDL 45.50 10/28/2024   LDLDIRECT 201.0 03/07/2023   LDLCALC 85 10/28/2024   ALT 17 10/28/2024   AST 18 10/28/2024   NA 138 10/28/2024   K 4.7 10/28/2024   CL 106 10/28/2024   CREATININE 1.41 (H) 10/28/2024   BUN 31 (H) 10/28/2024   CO2 22 10/28/2024   TSH 3.48 06/22/2024   HGBA1C 6.9 (H) 10/28/2024       Assessment & Plan:  Flu vaccine need -     Flu vaccine HIGH DOSE PF(Fluzone Trivalent)  Hypercholesterolemia Assessment & Plan: Low cholesterol diet and exercise. Follow lipid panel and liver function tests.  Continue lipitor .   Lab Results  Component Value Date   CHOL 171 10/28/2024   HDL 45.50 10/28/2024   LDLCALC 85 10/28/2024   LDLDIRECT 201.0 03/07/2023   TRIG 207.0 (H) 10/28/2024   CHOLHDL 4 10/28/2024      Type 2 diabetes mellitus with hyperglycemia, without long-term current use of insulin (HCC) Assessment & Plan: Follow met b and A1c. On no medication.   Lab Results  Component Value Date   HGBA1C 6.9 (H) 10/28/2024      SVT (supraventricular tachycardia) Assessment & Plan: On lopressor . Followed by cardiology. Just saw Dr Gollan as outlined. No  changes made in medication. Stable. Follow.    Stress Assessment & Plan: Overall appears to be handling things relatively well. Has family support. Follow.    Chronic rhinitis Assessment & Plan: Continue nasacort. Has seen ENT.    Renal mass Assessment & Plan: Saw urology - Dr Penne 05/22/24.  Recommended f/u renal ultrasound in one year   Primary hypertension Assessment & Plan: Continue lisinopril , amlodipine  and metoprolol . Blood pressure as outlined. Follow pressures. Follow metabolic panel.    Dizziness Assessment & Plan: Has had persistent problems with vertigo and dizziness.  Previous negative evaluation for BPV.  Has seen neurology.  Concerned regarding vestibular migraines.  Recommended continuing vestibular exercises at home.  Previous MRI unrevealing.  Symptoms vary.  Saw Dr Blair (ENT) - 09/2022 - no change.  Just evaluated by cardiology. Stable. No changes made. Feels stable. Follow.    Diastolic dysfunction Assessment & Plan: ECHO with diastolic dysfunction. History of SVT. Continue exercise.    Stage 3a chronic kidney disease (HCC) Assessment & Plan: Avoid antiinflammatory medication. Stay hydrated. Follow metabolic panel. Recent GFR 31. Follow.       Allena Hamilton, MD

## 2024-11-09 ENCOUNTER — Encounter: Payer: Self-pay | Admitting: Internal Medicine

## 2024-11-09 NOTE — Assessment & Plan Note (Signed)
 Follow met b and A1c. On no medication.  Lab Results  Component Value Date   HGBA1C 6.9 (H) 10/28/2024

## 2024-11-09 NOTE — Assessment & Plan Note (Signed)
 Continue nasacort. Has seen ENT.

## 2024-11-09 NOTE — Assessment & Plan Note (Signed)
 On lopressor . Followed by cardiology. Just saw Dr Gollan as outlined. No  changes made in medication. Stable. Follow.

## 2024-11-09 NOTE — Assessment & Plan Note (Addendum)
 ECHO with diastolic dysfunction. History of SVT. Continue exercise.

## 2024-11-09 NOTE — Assessment & Plan Note (Signed)
 Saw urology - Dr Penne 05/22/24.  Recommended f/u renal ultrasound in one year

## 2024-11-09 NOTE — Assessment & Plan Note (Signed)
 Continue lisinopril , amlodipine  and metoprolol . Blood pressure as outlined. Follow pressures. Follow metabolic panel.

## 2024-11-09 NOTE — Assessment & Plan Note (Signed)
 Avoid antiinflammatory medication. Stay hydrated. Follow metabolic panel. Recent GFR 31. Follow.

## 2024-11-09 NOTE — Assessment & Plan Note (Signed)
 Has had persistent problems with vertigo and dizziness.  Previous negative evaluation for BPV.  Has seen neurology.  Concerned regarding vestibular migraines.  Recommended continuing vestibular exercises at home.  Previous MRI unrevealing.  Symptoms vary.  Saw Dr Blair (ENT) - 09/2022 - no change.  Just evaluated by cardiology. Stable. No changes made. Feels stable. Follow.

## 2024-11-09 NOTE — Assessment & Plan Note (Signed)
 Overall appears to be handling things relatively well. Has family support. Follow.

## 2024-11-09 NOTE — Assessment & Plan Note (Signed)
 Low cholesterol diet and exercise. Follow lipid panel and liver function tests.  Continue lipitor .   Lab Results  Component Value Date   CHOL 171 10/28/2024   HDL 45.50 10/28/2024   LDLCALC 85 10/28/2024   LDLDIRECT 201.0 03/07/2023   TRIG 207.0 (H) 10/28/2024   CHOLHDL 4 10/28/2024

## 2025-02-23 ENCOUNTER — Other Ambulatory Visit

## 2025-03-04 ENCOUNTER — Ambulatory Visit: Admitting: Internal Medicine

## 2025-05-19 ENCOUNTER — Ambulatory Visit: Admitting: Physician Assistant

## 2025-06-14 ENCOUNTER — Ambulatory Visit
